# Patient Record
Sex: Male | Born: 1935 | Race: White | Hispanic: No | Marital: Married | Smoking: Former smoker
Health system: Southern US, Community
[De-identification: ages and names within clinical notes are randomized; demographics above are authoritative.]

## PROBLEM LIST (undated history)

## (undated) DIAGNOSIS — K219 Gastro-esophageal reflux disease without esophagitis: Secondary | ICD-10-CM

## (undated) DIAGNOSIS — R51 Headache: Secondary | ICD-10-CM

## (undated) DIAGNOSIS — M545 Low back pain, unspecified: Secondary | ICD-10-CM

## (undated) DIAGNOSIS — I10 Essential (primary) hypertension: Secondary | ICD-10-CM

## (undated) DIAGNOSIS — I219 Acute myocardial infarction, unspecified: Secondary | ICD-10-CM

## (undated) DIAGNOSIS — I2510 Atherosclerotic heart disease of native coronary artery without angina pectoris: Secondary | ICD-10-CM

## (undated) DIAGNOSIS — N400 Enlarged prostate without lower urinary tract symptoms: Secondary | ICD-10-CM

## (undated) DIAGNOSIS — IMO0002 Reserved for concepts with insufficient information to code with codable children: Secondary | ICD-10-CM

## (undated) DIAGNOSIS — J45909 Unspecified asthma, uncomplicated: Secondary | ICD-10-CM

## (undated) DIAGNOSIS — C801 Malignant (primary) neoplasm, unspecified: Secondary | ICD-10-CM

## (undated) DIAGNOSIS — R5383 Other fatigue: Secondary | ICD-10-CM

## (undated) DIAGNOSIS — K635 Polyp of colon: Secondary | ICD-10-CM

## (undated) DIAGNOSIS — J986 Disorders of diaphragm: Secondary | ICD-10-CM

## (undated) DIAGNOSIS — Z789 Other specified health status: Secondary | ICD-10-CM

## (undated) DIAGNOSIS — I714 Abdominal aortic aneurysm, without rupture, unspecified: Secondary | ICD-10-CM

## (undated) DIAGNOSIS — R9430 Abnormal result of cardiovascular function study, unspecified: Secondary | ICD-10-CM

## (undated) DIAGNOSIS — M5090 Cervical disc disorder, unspecified, unspecified cervical region: Secondary | ICD-10-CM

## (undated) DIAGNOSIS — I739 Peripheral vascular disease, unspecified: Secondary | ICD-10-CM

## (undated) DIAGNOSIS — R519 Headache, unspecified: Secondary | ICD-10-CM

## (undated) DIAGNOSIS — E785 Hyperlipidemia, unspecified: Secondary | ICD-10-CM

## (undated) DIAGNOSIS — I779 Disorder of arteries and arterioles, unspecified: Secondary | ICD-10-CM

## (undated) DIAGNOSIS — R748 Abnormal levels of other serum enzymes: Secondary | ICD-10-CM

## (undated) HISTORY — DX: Abnormal levels of other serum enzymes: R74.8

## (undated) HISTORY — DX: Other specified health status: Z78.9

## (undated) HISTORY — PX: SKIN CANCER EXCISION: SHX779

## (undated) HISTORY — DX: Hyperlipidemia, unspecified: E78.5

## (undated) HISTORY — DX: Atherosclerotic heart disease of native coronary artery without angina pectoris: I25.10

## (undated) HISTORY — DX: Gastro-esophageal reflux disease without esophagitis: K21.9

## (undated) HISTORY — DX: Low back pain: M54.5

## (undated) HISTORY — DX: Disorders of diaphragm: J98.6

## (undated) HISTORY — DX: Abnormal result of cardiovascular function study, unspecified: R94.30

## (undated) HISTORY — DX: Cervical disc disorder, unspecified, unspecified cervical region: M50.90

## (undated) HISTORY — PX: BREAST SURGERY: SHX581

## (undated) HISTORY — DX: Reserved for concepts with insufficient information to code with codable children: IMO0002

## (undated) HISTORY — DX: Essential (primary) hypertension: I10

## (undated) HISTORY — DX: Peripheral vascular disease, unspecified: I73.9

## (undated) HISTORY — DX: Low back pain, unspecified: M54.50

## (undated) HISTORY — DX: Benign prostatic hyperplasia without lower urinary tract symptoms: N40.0

## (undated) HISTORY — PX: CERVICAL DISC SURGERY: SHX588

## (undated) HISTORY — DX: Other fatigue: R53.83

## (undated) HISTORY — DX: Polyp of colon: K63.5

## (undated) HISTORY — DX: Disorder of arteries and arterioles, unspecified: I77.9

---

## 1998-07-29 ENCOUNTER — Other Ambulatory Visit: Admission: RE | Admit: 1998-07-29 | Discharge: 1998-07-29 | Payer: Self-pay | Admitting: Urology

## 1999-11-29 ENCOUNTER — Inpatient Hospital Stay (HOSPITAL_COMMUNITY): Admission: EM | Admit: 1999-11-29 | Discharge: 1999-12-02 | Payer: Self-pay | Admitting: *Deleted

## 2000-03-10 ENCOUNTER — Inpatient Hospital Stay (HOSPITAL_COMMUNITY): Admission: AD | Admit: 2000-03-10 | Discharge: 2000-03-12 | Payer: Self-pay | Admitting: Cardiology

## 2002-02-08 ENCOUNTER — Encounter: Payer: Self-pay | Admitting: Specialist

## 2002-02-08 ENCOUNTER — Ambulatory Visit (HOSPITAL_COMMUNITY): Admission: RE | Admit: 2002-02-08 | Discharge: 2002-02-08 | Payer: Self-pay | Admitting: Specialist

## 2003-06-06 ENCOUNTER — Ambulatory Visit (HOSPITAL_COMMUNITY): Admission: RE | Admit: 2003-06-06 | Discharge: 2003-06-06 | Payer: Self-pay | Admitting: Cardiology

## 2003-06-06 ENCOUNTER — Encounter: Payer: Self-pay | Admitting: Cardiology

## 2004-04-16 ENCOUNTER — Encounter: Payer: Self-pay | Admitting: Pulmonary Disease

## 2004-04-22 ENCOUNTER — Ambulatory Visit: Admission: RE | Admit: 2004-04-22 | Discharge: 2004-04-22 | Payer: Self-pay | Admitting: Pulmonary Disease

## 2004-04-22 ENCOUNTER — Encounter: Payer: Self-pay | Admitting: Pulmonary Disease

## 2004-05-13 ENCOUNTER — Ambulatory Visit (HOSPITAL_COMMUNITY): Admission: RE | Admit: 2004-05-13 | Discharge: 2004-05-13 | Payer: Self-pay | Admitting: Pulmonary Disease

## 2004-05-13 ENCOUNTER — Encounter: Payer: Self-pay | Admitting: Pulmonary Disease

## 2004-06-20 ENCOUNTER — Ambulatory Visit (HOSPITAL_COMMUNITY): Admission: RE | Admit: 2004-06-20 | Discharge: 2004-06-20 | Payer: Self-pay | Admitting: Pulmonary Disease

## 2004-08-26 ENCOUNTER — Inpatient Hospital Stay (HOSPITAL_COMMUNITY): Admission: RE | Admit: 2004-08-26 | Discharge: 2004-08-28 | Payer: Self-pay | Admitting: Neurosurgery

## 2004-10-17 ENCOUNTER — Ambulatory Visit: Payer: Self-pay | Admitting: Cardiology

## 2004-11-04 ENCOUNTER — Ambulatory Visit: Payer: Self-pay | Admitting: Cardiology

## 2004-11-28 ENCOUNTER — Ambulatory Visit: Payer: Self-pay | Admitting: Cardiology

## 2005-01-16 ENCOUNTER — Ambulatory Visit: Payer: Self-pay | Admitting: Cardiology

## 2005-01-30 ENCOUNTER — Ambulatory Visit: Payer: Self-pay | Admitting: Pulmonary Disease

## 2005-01-30 ENCOUNTER — Ambulatory Visit (HOSPITAL_COMMUNITY): Admission: RE | Admit: 2005-01-30 | Discharge: 2005-01-30 | Payer: Self-pay | Admitting: Neurosurgery

## 2005-02-27 ENCOUNTER — Ambulatory Visit: Payer: Self-pay | Admitting: Pulmonary Disease

## 2005-06-01 ENCOUNTER — Ambulatory Visit: Payer: Self-pay | Admitting: Pulmonary Disease

## 2005-06-05 ENCOUNTER — Ambulatory Visit: Payer: Self-pay | Admitting: Cardiology

## 2005-06-26 ENCOUNTER — Ambulatory Visit: Payer: Self-pay

## 2005-10-02 ENCOUNTER — Ambulatory Visit: Payer: Self-pay | Admitting: Pulmonary Disease

## 2005-10-20 ENCOUNTER — Ambulatory Visit: Payer: Self-pay | Admitting: Cardiology

## 2006-04-02 ENCOUNTER — Ambulatory Visit: Payer: Self-pay | Admitting: Pulmonary Disease

## 2006-07-09 ENCOUNTER — Ambulatory Visit: Payer: Self-pay | Admitting: Cardiology

## 2006-07-16 ENCOUNTER — Ambulatory Visit: Payer: Self-pay | Admitting: Internal Medicine

## 2006-08-19 ENCOUNTER — Encounter: Payer: Self-pay | Admitting: Pulmonary Disease

## 2006-08-27 ENCOUNTER — Ambulatory Visit: Payer: Self-pay | Admitting: Pulmonary Disease

## 2006-09-24 ENCOUNTER — Ambulatory Visit: Payer: Self-pay | Admitting: Pulmonary Disease

## 2006-09-24 LAB — CONVERTED CEMR LAB
INR: 1 (ref 0.9–2.0)
Prothrombin Time: 12.7 s (ref 10.0–14.0)
aPTT: 33.2 s (ref 26.5–36.5)

## 2006-09-28 ENCOUNTER — Encounter (INDEPENDENT_AMBULATORY_CARE_PROVIDER_SITE_OTHER): Payer: Self-pay | Admitting: Specialist

## 2006-09-28 ENCOUNTER — Ambulatory Visit: Payer: Self-pay | Admitting: Pulmonary Disease

## 2006-09-28 ENCOUNTER — Ambulatory Visit: Admission: RE | Admit: 2006-09-28 | Discharge: 2006-09-28 | Payer: Self-pay | Admitting: Pulmonary Disease

## 2006-10-05 ENCOUNTER — Ambulatory Visit: Payer: Self-pay | Admitting: Pulmonary Disease

## 2006-11-16 ENCOUNTER — Encounter: Payer: Self-pay | Admitting: Pulmonary Disease

## 2006-11-30 ENCOUNTER — Ambulatory Visit: Payer: Self-pay

## 2007-02-25 ENCOUNTER — Ambulatory Visit: Payer: Self-pay | Admitting: Cardiology

## 2007-02-25 LAB — CONVERTED CEMR LAB
BUN: 18 mg/dL (ref 6–23)
CO2: 27 meq/L (ref 19–32)
Calcium: 8.5 mg/dL (ref 8.4–10.5)
Chloride: 107 meq/L (ref 96–112)
Creatinine, Ser: 1.1 mg/dL (ref 0.4–1.5)
GFR calc Af Amer: 84 mL/min
GFR calc non Af Amer: 69 mL/min
Glucose, Bld: 121 mg/dL — ABNORMAL HIGH (ref 70–99)
Potassium: 4.1 meq/L (ref 3.5–5.1)
Sodium: 140 meq/L (ref 135–145)

## 2007-07-14 DIAGNOSIS — R0600 Dyspnea, unspecified: Secondary | ICD-10-CM | POA: Insufficient documentation

## 2007-07-14 DIAGNOSIS — J449 Chronic obstructive pulmonary disease, unspecified: Secondary | ICD-10-CM | POA: Insufficient documentation

## 2007-07-14 DIAGNOSIS — R0609 Other forms of dyspnea: Secondary | ICD-10-CM

## 2007-11-07 ENCOUNTER — Ambulatory Visit: Payer: Self-pay | Admitting: Pulmonary Disease

## 2007-11-07 DIAGNOSIS — R930 Abnormal findings on diagnostic imaging of skull and head, not elsewhere classified: Secondary | ICD-10-CM | POA: Insufficient documentation

## 2008-02-01 ENCOUNTER — Ambulatory Visit: Payer: Self-pay | Admitting: Cardiology

## 2008-02-20 ENCOUNTER — Ambulatory Visit: Payer: Self-pay | Admitting: Cardiology

## 2008-02-20 ENCOUNTER — Ambulatory Visit: Payer: Self-pay

## 2008-02-20 ENCOUNTER — Encounter: Payer: Self-pay | Admitting: Cardiology

## 2008-05-04 ENCOUNTER — Ambulatory Visit: Payer: Self-pay | Admitting: Pulmonary Disease

## 2008-10-31 ENCOUNTER — Ambulatory Visit: Payer: Self-pay | Admitting: Cardiology

## 2008-11-01 DIAGNOSIS — I10 Essential (primary) hypertension: Secondary | ICD-10-CM | POA: Insufficient documentation

## 2008-11-01 DIAGNOSIS — E785 Hyperlipidemia, unspecified: Secondary | ICD-10-CM | POA: Insufficient documentation

## 2008-11-07 ENCOUNTER — Ambulatory Visit: Payer: Self-pay

## 2008-11-23 ENCOUNTER — Ambulatory Visit: Payer: Self-pay | Admitting: Pulmonary Disease

## 2008-11-30 ENCOUNTER — Encounter: Payer: Self-pay | Admitting: Cardiology

## 2008-11-30 ENCOUNTER — Ambulatory Visit: Payer: Self-pay | Admitting: Cardiology

## 2009-05-07 ENCOUNTER — Encounter: Payer: Self-pay | Admitting: Cardiology

## 2009-05-24 ENCOUNTER — Ambulatory Visit: Payer: Self-pay | Admitting: Pulmonary Disease

## 2009-05-28 ENCOUNTER — Encounter: Payer: Self-pay | Admitting: Cardiology

## 2009-05-29 ENCOUNTER — Ambulatory Visit: Payer: Self-pay | Admitting: Cardiology

## 2009-11-25 ENCOUNTER — Ambulatory Visit: Payer: Self-pay | Admitting: Pulmonary Disease

## 2009-12-13 ENCOUNTER — Telehealth (INDEPENDENT_AMBULATORY_CARE_PROVIDER_SITE_OTHER): Payer: Self-pay | Admitting: *Deleted

## 2010-02-26 ENCOUNTER — Telehealth: Payer: Self-pay | Admitting: Cardiology

## 2010-02-27 ENCOUNTER — Encounter: Payer: Self-pay | Admitting: Cardiology

## 2010-02-28 ENCOUNTER — Ambulatory Visit: Payer: Self-pay | Admitting: Cardiology

## 2010-05-15 ENCOUNTER — Encounter: Payer: Self-pay | Admitting: Cardiology

## 2010-05-16 ENCOUNTER — Ambulatory Visit: Payer: Self-pay | Admitting: Cardiology

## 2010-05-26 ENCOUNTER — Ambulatory Visit: Payer: Self-pay | Admitting: Pulmonary Disease

## 2010-10-05 ENCOUNTER — Encounter: Payer: Self-pay | Admitting: Pulmonary Disease

## 2010-10-14 NOTE — Assessment & Plan Note (Signed)
Summary: rov for emphysema   Copy to:  Harlow Mares  M.D. Primary Provider/Referring Provider:  Harlow Mares MD  CC:  Pt is here for a 6 month f/u appt.  Pt is taking both Advair and Spiriva as he felt  his breathing is better on both these meds. Pt states occ. cough with small amounts of white sputum.  [name]  History of Present Illness: The pt comes in today for f/u of his know mild to moderate emphysema.  Overall, he feels that he is doing well, and has excellent exercise tolerance.  He has been experimenting with his meds, and feels that both advair and spiriva help him considerably.  Denies any cough, congestion, or mucus.  Current Medications (verified): 1)  Spiriva Handihaler 18 Mcg  Caps (Tiotropium Bromide Monohydrate) .... Inhale Contents of 1 Capsule Once A Day 2)  Advair Diskus 250-50 Mcg/dose  Misc (Fluticasone-Salmeterol) .... Inhale 1 Puff Two Times A Day 3)  Proventil 90 Mcg/act  Aers (Albuterol) .... Use As Directed Every 4 Hours 4)  Atenolol 50 Mg  Tabs (Atenolol) .... Take 1 Tablet By Mouth Once A Day 5)  [facility]vs Aspirin 325 Mg  Tabs (Aspirin) .... Take 1 Tablet By Mouth Once A Day 6)  Furosemide 40 Mg Tabs (Furosemide) .... Take One Tablet By Mouth Daily As Needed 7)  Avodart 0.5 Mg Caps (Dutasteride) .... Take 1 Tablet By Mouth Once A Day 8)  Omeprazole 20 Mg Cpdr (Omeprazole) .... Take One Tablet By Mouth in Am.  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 PTCA RCA FOR IN-STENT RESTENOSIS 6/01 ABNORMAL CHEST XRAY (ICD-793) EMPHYSEMA, MILD (ICD-492.8) GERD CERVICAL DISK DISEASE S/P NEUROSURGERY BPH HX COLON POLPS PARALYZED HEMIDIAPHRAGM chronic Carotid artery disease mild Colon polyps BPH REMOTE TOBACCO good LV  function Chronically elevated CPK (mild)  Review of Systems      See HPI  Vital Signs:  Patient profile:   77 year old male Height:      74 inches Weight:      208 pounds BMI:     26.80 O2 Sat:      95 % on Room air Temp:     97.8 degrees F oral Pulse rate:   68 / minute BP sitting:   140 / 90  (right arm) Cuff size:   regular  Vitals Entered By: Arman Filter LPN (May 24, 2009 9:16 AM)  O2 Flow:  Room air CC: Pt is here for a 6 month f/u appt.  Pt is taking both Advair and Spiriva as he felt  his breathing is better on both these meds. Pt states occ. cough with small amounts of white sputum.   Comments Medications reviewed with patient Arman Filter LPN  May 24, 2009 9:16 AM    Physical Exam  General:  wd male in nad Nose:  no purulence Lungs:  mild decrease in bs, faint basilar crackles, no wheezing Heart:  rrr Extremities:  no edema.   Impression & Recommendations:  Problem # 1:  EMPHYSEMA, MILD (ICD-492.8)  the pt is doing well on his current regimen.  He is satisfied with his exercise tolerance, and has had no recent exacerbation.  I have asked him to continue on his current meds.  He is due for his pna shot today, and will get flu  shot when vaccine available.  Other Orders: Est. Patient Level II (84132) Pneumococcal Vaccine (44010) Admin 1st Vaccine (27253) Admin 1st Vaccine [facility]) [phone])  Patient Instructions: 1)  stay on current meds. 2)  don't forget about flu shot in october. 3)  stay as active as possible. 4)  followup with me in 6mos     Pneumovax Vaccine    Vaccine Type: Pneumovax    Site: right deltoid    Mfr: Merck    Dose: 0.5 ml    Route: IM    Given by: Arman Filter LPN    Exp. Date: 10/19/2009    Lot #: [id number]    VIS given: 04/11/96 version given May 24, 2009.

## 2010-10-14 NOTE — Progress Notes (Signed)
Summary: sub for proventil   Phone Note Call from Patient Call back at Home Phone [phone]   Caller: Patient Call For: clance Summary of Call: pt's ins- [employer]/ [employer], has sent him a letter recommending 2 subs for his rx proventil as this is no longer on their preferred list. subs acceptable are: PROAIR HFA or VENTOLIN HFA. pt states that if dr clance approves either of these for a sub [facility] [place] [address] ([facility] only one [facility] per pt).  Initial call taken by: Tivis Ringer, CNA,  December 13, 2009 10:36 AM  Follow-up for Phone Call        called and spoke with pt.  pt aware rx sent to pharmacy.  Megan Reynolds LPN  December 13, 1476 10:42 AM     New/Updated Medications: VENTOLIN HFA 108 (90 BASE) MCG/ACT  AERS (ALBUTEROL SULFATE) 1-2 puffs every 4-6 hours as needed Prescriptions: VENTOLIN HFA 108 (90 BASE) MCG/ACT  AERS (ALBUTEROL SULFATE) 1-2 puffs every 4-6 hours as needed  #1 x 5   Entered by:   Arman Filter LPN   Authorized by:   Barbaraann Share MD   Signed by:   Arman Filter LPN on 29/56/2130   Method used:   [facility] to        [facility] [id number] (retail)       [address]       [place], [place]  [address]       Ph: [phone] or [phone]       Fax: [phone]   RxID:   [id number]

## 2010-10-14 NOTE — Assessment & Plan Note (Signed)
Visit Type:  Follow-up Referring Provider:  Harlow Mares  M.D. Primary Provider:  Harlow Mares MD   History of Present Illness: patient is here for follow up of his coronary disease. He was seen in the office recently having arm discomfort. There was concern that it could be ischemia because of his known coronary disease. Also there was consideration that he could have problems with carpal tunnel and he was told to change his wristwatch to the other hand. In fact he is feeling better. He did have a stress Myoview scan. This study showed a good ejection fraction and no significant ischemia. He is now here for followup.  Current Medications (verified): 1)  Spiriva Handihaler 18 Mcg  Caps (Tiotropium Bromide Monohydrate) .... Inhale Contents of 1 Capsule Once A Day 2)  Prilosec 20 Mg Cpdr (Omeprazole) .... Take 1 Tablet By Mouth Once A Day 3)  Advair Diskus 250-50 Mcg/dose  Misc (Fluticasone-Salmeterol) .... Inhale 1 Puff Two Times A Day 4)  Proventil 90 Mcg/act  Aers (Albuterol) .... Use As Directed Every 4 Hours 5)  Atenolol 50 Mg  Tabs (Atenolol) .... Take 1 Tablet By Mouth Once A Day 6)  Cvs Aspirin 325 Mg  Tabs (Aspirin) .... Take 1 Tablet By Mouth Once A Day 7)  Furosemide 20 Mg Tabs (Furosemide) .... Take 1 Tablet By Mouth Two Times A Day As Needed 8)  Avodart 0.5 Mg Caps (Dutasteride) .... 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 PTCA RCA FOR IN-STENT RESTENOSIS 6/01    ABNORMAL CHEST XRAY (ICD-793.1)    DYSPNEA ON EXERTION (ICD-786.09)    EMPHYSEMA, MILD (ICD-492.8)    GERD    CERVICAL DISK DISEASE S/P NEUROSURGERY    BPH    HX COLON POLPS    PARALYZED HEMIDIAPHRAGM  chronic    Carotid artery disease mild    Colon polyps    BPH    REMOTE TOBACCO    good LV function    Chronically elevated CPK (mild)  Review of Systems       patient feels well today. He has no fevers or chills. There has been no major weight gain or weight loss. There is no blurred vision. He has no GI or GU symptoms. There are no musculoskeletal complaints today. Review of systems otherwise is negative.  Vital Signs:  Patient profile:   77 year old male Height:      74 inches Weight:      206 pounds Pulse rate:   72 / minute BP sitting:   124 / 64  (left arm)  Vitals Entered By: Laurance Flatten CMA (November 30, 2008 9:16 AM)  Physical Exam  General:  Stable in general. Head:  Head is normocephalic. Eyes:  HEENT reveals no xanthelasma. He has normal extraocular motion. Mouth:  There no major oral or dental problems today. Neck:  There is no jugular venous distention. There are nor carotid bruits. Chest Wall:  Chest wall is nontender Lungs:  Lungs are clear. Respiratory effort is nonlabored. Heart:  cardiac exam reveals an S1-S2. There no clicks or significant murmurs Abdomen:  Bowel sounds are normal. Abdomen is soft. Extremities:  There is no peripheral  edema Psych:  Patient is oriented to person time and place. Affect is normal. He is here with his wife today.   Impression & Recommendations:  Problem # 1:  HYPERTENSION, BENIGN (ICD-401.1)  His updated medication list for this problem includes:    Atenolol 50 Mg Tabs (Atenolol) .[name]... Take 1 tablet by mouth once a day    [facility]vs Aspirin 325 Mg Tabs (Aspirin) .[name]... Take 1 tablet by mouth once a day    Furosemide 20 Mg Tabs (Furosemide) .[name]... Take 1 tablet by mouth two times a day as needed Blood pressure stable today. No change in his cardiac meds for blood pressure  Problem # 2:  HYPERLIPIDEMIA-MIXED (ICD-272.4) He does have hyperlipidemia. As always we talk about watching his diet carefully. He did not use a statin because  of elevated CPK.  Problem # 3:  CAD, NATIVE VESSEL (ICD-414.01)  His updated medication list for this problem includes:    Atenolol 50 Mg Tabs (Atenolol) .[name]... Take 1 tablet by mouth once a day    Cvs Aspirin 325 Mg Tabs (Aspirin) .[name]... Take 1 tablet by mouth once a day Coronary disease is stable. See the discussion concerning his Myoview scan. There was no significant ischemia. He'll be followed.  Problem # 4:  * POSSIBLE CARPAL TUNNEL at this time the patient will try to continue to keep his watch off his left wrist.  Cardiac status is stable. I'll see him back in 6 months for followup.

## 2010-10-14 NOTE — Assessment & Plan Note (Signed)
Summary: rov for emphysema   CC:  Pt is here for a 6 month f/u appt.  Pt states breathing is the same.  Pt denied any new complaints.  Pt states he is not taking his Spiriva and Adavir daily.  Tim [name]  History of Present Illness: the pt comes in today for f/u of his obstructive lung disease.  He is doing fairly well from a breathing standpoint, and denies any decrease in his exertional tolerance.  No significant cough or mucus.  He is continuing to struggle with the costs of his bronchodilator regimen.  He is spreading his dosages over a time period to conserve.  Current Medications (verified): 1)  Spiriva Handihaler 18 Mcg  Caps (Tiotropium Bromide Monohydrate) .... Inhale Contents of 1 Capsule Once A Day 2)  Prilosec 20 Mg Cpdr (Omeprazole) .... Take 1 Tablet By Mouth Once A Day 3)  Advair Diskus 250-50 Mcg/dose  Misc (Fluticasone-Salmeterol) .... Inhale 1 Puff Two Times A Day 4)  Proventil 90 Mcg/act  Aers (Albuterol) .... Use As Directed Every 4 Hours 5)  Atenolol 50 Mg  Tabs (Atenolol) .... Take 1 Tablet By Mouth Once A Day 6)  Cvs Aspirin 325 Mg  Tabs (Aspirin) .... Take 1 Tablet By Mouth Once A Day 7)  Furosemide 20 Mg Tabs (Furosemide) .... Take 1 Tablet By Mouth Two Times A Day As Needed 8)  Avodart 0.5 Mg Caps (Dutasteride) .... Take 1 Tablet By Mouth Once A Day  Allergies (verified): No Known Drug Allergies  Review of Systems      See HPI  Vital Signs:  Patient profile:   77 year old male Weight:      210 pounds BMI:     27.06 O2 Sat:      97 % Temp:     97.7 degrees F Pulse rate:   97 / minute BP sitting:   154 / 86  (left arm) Cuff size:   regular  Vitals Entered By: Arman Filter LPN (November 23, 2008 9:21 AM)  O2 Sat on room air at rest %:  97 CC: Pt is here for a 6 month f/u appt.  Pt states breathing is the same.  Pt denied any new complaints.  Pt states he is not taking his Spiriva and Adavir daily.   Comments Medications reviewed with patient Arman Filter LPN   November 23, 2008 9:27 AM    Physical Exam  General:  thin male in nad Lungs:  mildly decreased bs, no wheezing or rhonchi Heart:  rrr, no mrg Extremities:  no significant edema   Impression & Recommendations:  Problem # 1:  EMPHYSEMA, MILD (ICD-492.8) the pt is doing well from a pulmonary standpoint despite rationing his meds.  I have asked him to try and get by with one med, but needs to take on a consistent basis.  I have asked him to try and come off advair first and see how things go.  I have also asked him to get on some type of exercise program to increase endurance.  Complete Medication List: 1)  Spiriva Handihaler 18 Mcg Caps (Tiotropium bromide monohydrate) .... Inhale contents of 1 capsule once a day 2)  Prilosec 20 Mg Cpdr (Omeprazole) .... Take 1 tablet by mouth once a day 3)  Advair Diskus 250-50 Mcg/dose Misc (Fluticasone-salmeterol) .... Inhale 1 puff two times a day 4)  Proventil 90 Mcg/act Aers (Albuterol) .... Use as directed every 4 hours 5)  Atenolol 50 Mg Tabs (Atenolol) .... Take  1 tablet by mouth once a day 6)  Cvs Aspirin 325 Mg Tabs (Aspirin) .... Take 1 tablet by mouth once a day 7)  Furosemide 20 Mg Tabs (Furosemide) .... Take 1 tablet by mouth two times a day as needed 8)  Avodart 0.5 Mg Caps (Dutasteride) .... Take 1 tablet by mouth once a day  Other Orders: Est. Patient Level III (04540)  Patient Instructions: 1)  stay on spiriva each am. 2)  try to stop advair and see if you can do ok off the med.  If your breathing worsens, would go back on advair and see if you can stop spiriva. 3)  follow up with me in 6mos or sooner if problems.

## 2010-10-14 NOTE — Assessment & Plan Note (Signed)
Summary: f72m  Medications Added FUROSEMIDE 40 MG TABS (FUROSEMIDE) Take one tablet by mouth daily as needed      Allergies Added: NKDA  Visit Type:  Follow-up Referring Provider:  Harlow Mares  M.D. Primary Provider:  Harlow Mares MD  CC:  CAD.  History of Present Illness: The patient is seen for followup of coronary artery disease.  He's had interventions in the past.  Earlier this year we were concerned about some mild symptoms.  He had a Myoview in February, 2010 that showed no significant ischemia.  He returns today and he is not having any significant problems.  He saw Dr.Clance of pulmonary recently who felt he was stable.  He's not had any significant chest pain.  Current Medications (verified): 1)  Spiriva Handihaler 18 Mcg  Caps (Tiotropium Bromide Monohydrate) .... Inhale Contents of 1 Capsule Once A Day 2)  Advair Diskus 250-50 Mcg/dose  Misc (Fluticasone-Salmeterol) .... Inhale 1 Puff Two Times A Day 3)  Proventil 90 Mcg/act  Aers (Albuterol) .... Use As Directed Every 4 Hours 4)  Atenolol 50 Mg  Tabs (Atenolol) .... Take 1 Tablet By Mouth Once A Day 5)  Cvs Aspirin 325 Mg  Tabs (Aspirin) .... Take 1 Tablet By Mouth Once A Day 6)  Furosemide 40 Mg Tabs (Furosemide) .... Take One Tablet By Mouth Daily As Needed 7)  Avodart 0.5 Mg Caps (Dutasteride) .... Take 1 Tablet By Mouth Once A Day 8)  Omeprazole 20 Mg Cpdr (Omeprazole) .... Take One Tablet By Mouth in Am.  Allergies (verified): No Known Drug Allergies  Past History:  Past Medical History: Reviewed history from 05/28/2009 and no changes required.  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 PTCA RCA FOR IN-STENT RESTENOSIS 6/01../ Myoview done February, 2010 showed no ischemia.   ABNORMAL CHEST XRAY (ICD-793) EMPHYSEMA, MILD (ICD-492.8) GERD CERVICAL DISK  DISEASE S/P NEUROSURGERY BPH HX COLON POLPS PARALYZED HEMIDIAPHRAGM chronic Carotid artery disease mild Colon polyps BPH REMOTE TOBACCO good LV function Chronically elevated CPK (mild)  Review of Systems       Patient denies fever, chills, headache, skin rash, sweats, change in vision, change in hearing, chest pain, cough.  He does have some shortness of breath it is related to his underlying pulmonary disease that has not changed.  He denies GI symptoms or GU symptoms.  All other systems are reviewed and are negative.  Vital Signs:  Patient profile:   77 year old male Height:      74 inches Weight:      204 pounds BMI:     26.29 Pulse rate:   63 / minute BP sitting:   138 / 80  (left arm)  Vitals Entered By: Laurance Flatten CMA (May 29, 2009 9:56 AM)  Physical Exam  General:  stable today. Eyes:  no xanthelasma Neck:  no jugular venous distention. Lungs:  lungs reveal mild decrease in breath sounds but no rales. Heart:  cardiac exam reveals S1-S2.  No clicks or significant murmurs. Abdomen:  abdomen is soft. Msk:  no musculoskeletal deformities. Extremities:  no peripheral edema. Psych:  patient is oriented to person time and place.  Affect is normal.   Impression & Recommendations:  Problem # 1:  HYPERTENSION, BENIGN (ICD-401.1) Blood pressure is controlled today.  No change in meds.  Problem # 2:  CAD, NATIVE VESSEL (ICD-414.01) Coronary disease is stable.  No  further workup at this time.  EKG is done.  It is reviewed by me.  It is normal.  Problem # 3:  FLUID OVERLOAD (ICD-276.6) At times the patient feels some mild abdominal bloating.  He will take one dose of Lasix.  With this he gets a good response.  Patient Instructions: 1)  Follow up in 1 year

## 2010-10-14 NOTE — Assessment & Plan Note (Signed)
Summary: Tim [name] for emphysema   Chief Complaint:  Pt is here for a 6 month follow up.  Pt states no change in breathing.  Pt tried the samples of Symbicort but stated he preferred Advair instead.[name]  History of Present Illness: patient comes in today for followup of his emphysema. At the last visit, the patient was tried on symbicort due to expense, but felt advair did a better job and stayed on this. He feels that he is at his usual baseline, and has not had any recent exacerbation or pulmonary infection. He denies any significant cough or mucus production.     Current Allergies: No known allergies      Review of Systems      See HPI   Vital Signs:  Patient Profile:   77 Years Old Male Height:     74 inches Weight:      209.50 pounds O2 Sat:      99 % O2 treatment:    Room Air Temp:     97.6 degrees F oral Pulse rate:   78 / minute BP sitting:   144 / 82  (left arm) Cuff size:   regular  Vitals Entered By: Cyndia Diver LPN (May 04, 2008 8:58 AM)             Comments Medications reviewed with patient Cyndia Diver LPN  May 04, 2008 8:58 AM      Physical Exam  General:     wd male in nad Lungs:     chest is fairly clear, no definite wheezing Heart:     rrr      Impression & Recommendations:  Problem # 1:  EMPHYSEMA, MILD (ICD-492.8) the patient is doing well on his current regimen, and I have asked him to stay on this as well as continuing some type of exercise program. I have asked him to look into some type of mail order program for his medications, which would greatly reduce his expense. Orders: Est. Patient Level II (21308)   Medications Added to Medication List This Visit: 1)  Nexium 40 Mg Cpdr (Esomeprazole magnesium) .... Take 1 tablet by mouth two times a day 2)  Furosemide 20 Mg Tabs (Furosemide) .... Take 1 tablet by mouth two times a day as needed 3)  Avodart 0.5 Mg Caps (Dutasteride) .... Take 1 tablet by mouth once a day   Patient  Instructions: 1)  stay on current meds 2)  check and see if you have a drug mail order program that is cheaper. 3)  f/u 6mos or sooner if problems.   Prescriptions: PROVENTIL 90 MCG/ACT  AERS (ALBUTEROL) Use as directed every 4 hours  #1 x 6   Entered and Authorized by:   Barbaraann Share MD   Signed by:   Barbaraann Share MD on 05/04/2008   Method used:   Print then Give to Patient   RxID:   [id number] ADVAIR DISKUS 250-50 MCG/DOSE  MISC (FLUTICASONE-SALMETEROL) Inhale 1 puff two times a day  #60 Each x 6   Entered and Authorized by:   Barbaraann Share MD   Signed by:   Barbaraann Share MD on 05/04/2008   Method used:   Print then Give to Patient   RxID:   [id number] SPIRIVA HANDIHALER 18 MCG  CAPS (TIOTROPIUM BROMIDE MONOHYDRATE) Inhale contents of 1 capsule once a day  #30 Capsule x 6   Entered and Authorized by:   Barbaraann Share MD   Signed  by:   Barbaraann Share MD on 05/04/2008   Method used:   Print then Give to Patient   RxID:   [id number]  ]

## 2010-10-14 NOTE — Assessment & Plan Note (Signed)
Summary: f1y  Medications Added ASPIRIN EC 325 MG TBEC (ASPIRIN) Take one tablet by mouth daily      Allergies Added: NKDA  Visit Type:  Follow-up Referring Provider:  Harlow Mares  M.D. Primary Provider:  Harlow Mares MD  CC:  CAD.  History of Present Illness: The patient is seen for followup of coronary artery disease.  He is doing well.  He is not having any chest pain or shortness of breath.  He does have a full activities.  He mentions that he has fatigue in the morning but he feels better as the day goes on.  Current Medications (verified): 1)  Spiriva Handihaler 18 Mcg  Caps (Tiotropium Bromide Monohydrate) .... Inhale Contents of 1 Capsule Once A Day 2)  Advair Diskus 250-50 Mcg/dose  Misc (Fluticasone-Salmeterol) .... Inhale 1 Puff Two Times A Day 3)  Ventolin Hfa 108 (90 Base) Mcg/act  Aers (Albuterol Sulfate) .[name].. 1-2 Puffs Every 4-6 Hours As Needed 4)  Atenolol 50 Mg  Tabs (Atenolol) .... Take 1 Tablet By Mouth Once A Day 5)  Cvs Aspirin 325 Mg  Tabs (Aspirin) .... Take 1 Tablet By Mouth Once A Day 6)  Furosemide 40 Mg Tabs (Furosemide) .... Take One Tablet By Mouth Daily As Needed 7)  Finasteride 5 Mg Tabs (Finasteride) .... Take 1 Tablet By Mouth Once A Day 8)  Omeprazole 20 Mg Cpdr (Omeprazole) .... Take One Tablet By Mouth in Am. 9)  Aspirin Ec 325 Mg Tbec (Aspirin) .... 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 PTCA RCA FOR IN-STENT RESTENOSIS 6/01../ Myoview done February, 2010 showed no ischemia.   ABNORMAL CHEST XRAY (ICD-793) EMPHYSEMA, MILD (ICD-492.8) GERD CERVICAL DISK DISEASE S/P NEUROSURGERY BPH HX COLON POLPS PARALYZED HEMIDIAPHRAGM chronic Carotid artery disease mild  /    Doppler... February 27, 2010... 0-39% bilateral ICA Colon polyps BPH REMOTE TOBACCO good LV function Chronically elevated CPK (mild) Morning fatigue   August, 2011  Review of Systems       Patient denies fever, chills, headache, sweats, rash, change in vision, change in hearing, chest pain, cough, nausea or vomiting, urinary symptoms.  All of the systems are reviewed and are negative.  Vital Signs:  Patient profile:   77 year old male Height:      74 inches Weight:      210 pounds BMI:     27.06 Pulse rate:   74 / minute BP sitting:   138 / 82  (left arm) Cuff size:   large  Vitals Entered By: Hardin Negus, RMA (May 16, 2010 9:28 AM)  Physical Exam  General:  patient is stable today. Head:  head is atraumatic. Eyes:  no xanthelasma. Neck:  no jugular venous distention. Chest Wall:  no chest wall tenderness. Lungs:  lungs are clear.  Respiratory effort is unlabored. Heart:  cardiac exam reveals S1-S2.  No clicks or significant murmurs. Abdomen:  abdomen is soft. Msk:  no musculoskeletal deformities. Extremities:  no peripheral edema. Skin:  no skin rashes. Psych:  the patient is oriented to person time and place.  Affect is normal.   Impression & Recommendations:  Problem # 1:  CAROTID ARTERY DISEASE (ICD-433.10)  His updated medication list for this problem includes:    Cvs Aspirin 325  Mg Tabs (Aspirin) .[name]... Take 1 tablet by mouth once a day    Aspirin Ec 325 Mg Tbec (Aspirin) .[name]... Take one tablet by mouth daily carotid artery disease is stable.  No further workup today.  Problem # 2:  FLUID OVERLOAD (ICD-276.6) Fluid status is stable.  No change in therapy.  Problem # 3:  HYPERTENSION, BENIGN (ICD-401.1)  His updated medication list for this problem includes:    Atenolol 50 Mg Tabs (Atenolol) .[name]... Take 1 tablet by mouth once a day    Cvs Aspirin 325 Mg Tabs (Aspirin) .[name]... Take 1 tablet by mouth once a day    Furosemide 40 Mg Tabs (Furosemide) .[name]...  Take one tablet by mouth daily as needed    Aspirin Ec 325 Mg Tbec (Aspirin) .[name]... Take one tablet by mouth daily blood pressure control.  No change in therapy.  Problem # 4:  HYPERLIPIDEMIA-MIXED (ICD-272.4) The patient has chronically elevated CPK .  For this reason we have not been able to use statins.  Problem # 5:  CAD, NATIVE VESSEL (ICD-414.01)  His updated medication list for this problem includes:    Atenolol 50 Mg Tabs (Atenolol) .[name]... Take 1 tablet by mouth once a day    Cvs Aspirin 325 Mg Tabs (Aspirin) .[name]... Take 1 tablet by mouth once a day    Aspirin Ec 325 Mg Tbec (Aspirin) .[name]... Take one tablet by mouth daily  Orders: EKG w/ Interpretation (93000) Coronary disease is stable.  EKG is done today and reviewed by me.  There is no significant change.  No further workup needed.  Problem # 6:  DYSPNEA ON EXERTION (ICD-786.09)  His updated medication list for this problem includes:    Atenolol 50 Mg Tabs (Atenolol) .[name]... Take 1 tablet by mouth once a day    Cvs Aspirin 325 Mg Tabs (Aspirin) .[name]... Take 1 tablet by mouth once a day    Furosemide 40 Mg Tabs (Furosemide) .[name]... Take one tablet by mouth daily as needed    Aspirin Ec 325 Mg Tbec (Aspirin) .[name]... Take one tablet by mouth daily The patient's pulmonary status is followed very carefully by pulmonary team.  No change in therapy.  Problem # 7:  * MORNING FATIGUE, 2011 This is a new complaint today.  It is very clear that the patient feels better as the day goes on.  He is able to function well both from the pulmonary and the cardiac viewpoint.  I feel that his morning fatigue is not cardiac in origin.  No further cardiac workup for this.  Patient Instructions: 1)  Your physician recommends that you schedule a follow-up appointment in: 1 year

## 2010-10-14 NOTE — Miscellaneous (Signed)
  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 PTCA RCA FOR IN-STENT RESTENOSIS 6/01../ Myoview done February, 2010 showed no ischemia.   ABNORMAL CHEST XRAY (ICD-793) EMPHYSEMA, MILD (ICD-492.8) GERD CERVICAL DISK DISEASE S/P NEUROSURGERY BPH HX COLON POLPS PARALYZED HEMIDIAPHRAGM chronic Carotid artery disease mild Colon polyps BPH REMOTE TOBACCO good LV function Chronically elevated CPK (mild)  (05/28/2009 13:24) Added new observation of REFERRING MD: Harlow Mares  M.D. 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 PTCA RCA FOR IN-STENT RESTENOSIS 6/01../ Myoview done February, 2010 showed no ischemia.   ABNORMAL CHEST XRAY (ICD-793) EMPHYSEMA, MILD (ICD-492.8) GERD CERVICAL DISK DISEASE S/P NEUROSURGERY BPH HX COLON POLPS PARALYZED HEMIDIAPHRAGM chronic Carotid artery disease mild Colon polyps BPH REMOTE TOBACCO good LV function Chronically elevated CPK (mild)

## 2010-10-14 NOTE — Assessment & Plan Note (Signed)
Summary: rov for emphysema   Copy to:  Tim [name]  M.D. Primary Provider/Referring Provider:  Harlow Mares MD  CC:  Pt is here for a 6 month f/u appt.  Pt states breathing is unchanged from last visit.  Pt states he will occ have "coughing spells" and will cough up clear sputum.  Pt states he will alternate taking Spiriva and Advair.  History of Present Illness: the pt comes in today for f/u of his mild emphysema.  He is doing fairly well on his current regimen, and feels that he is at his usual baseline.  He has had a recent uri treated with zpak by his primary md.  Currently with no significant cough, congestion, or purulence.  He has occasional am cough that he relates to postnasal drip  Current Medications (verified): 1)  Spiriva Handihaler 18 Mcg  Caps (Tiotropium Bromide Monohydrate) .... Inhale Contents of 1 Capsule Once A Day 2)  Advair Diskus 250-50 Mcg/dose  Misc (Fluticasone-Salmeterol) .... Inhale 1 Puff Two Times A Day 3)  Proventil 90 Mcg/act  Aers (Albuterol) .... Use As Directed Every 4 Hours 4)  Atenolol 50 Mg  Tabs (Atenolol) .... Take 1 Tablet By Mouth Once A Day 5)  Cvs Aspirin 325 Mg  Tabs (Aspirin) .... Take 1 Tablet By Mouth Once A Day 6)  Furosemide 40 Mg Tabs (Furosemide) .... Take One Tablet By Mouth Daily As Needed 7)  Finasteride 5 Mg Tabs (Finasteride) .... Take 1 Tablet By Mouth Once A Day 8)  Omeprazole 20 Mg Cpdr (Omeprazole) .... Take One Tablet By Mouth in Am.  Allergies (verified): No Known Drug Allergies  Review of Systems      See HPI  Vital Signs:  Patient profile:   77 year old male Height:      74 inches Weight:      215 pounds BMI:     27.70 O2 Sat:      93 % on Room air Temp:     97.6 degrees F oral Pulse rate:   73 / minute BP sitting:   140 / 88  (left arm) Cuff size:   regular  Vitals Entered By: Arman Filter LPN (November 25, 2009 9:15 AM)  O2 Flow:  Room air CC: Pt is here for a 6 month f/u appt.  Pt states breathing is  unchanged from last visit.  Pt states he will occ have "coughing spells" and will cough up clear sputum.  Pt states he will alternate taking Spiriva and Advair Comments Medications reviewed with patient Arman Filter LPN  November 25, 2009 9:18 AM    Physical Exam  General:  wd male in nad Lungs:  decreased bs, but no wheezing or rhonchi Heart:  rrr   Impression & Recommendations:  Problem # 1:  EMPHYSEMA, MILD (ICD-492.8)  the pt is doing well on his current regimen.  He has had a recent URI treated with zpak by his primary md, but overall feels well.  I have asked him to continue with current meds, and to f/u with me in 6mos.  Medications Added to Medication List This Visit: 1)  Finasteride 5 Mg Tabs (Finasteride) .... Take 1 tablet by mouth once a day  Other Orders: Est. Patient Level II (16010)  Patient Instructions: 1)  no change in meds. 2)  can try zyrtec 10mg  at bedtime for postnasal drip and mucus collecting in throat. 3)  followup with me in 6mos

## 2010-10-14 NOTE — Progress Notes (Signed)
Summary: info on procedure on friday   Phone Note Call from Patient Call back at Home Phone [phone]   Caller: Patient [phone] Reason for Call: Talk to Nurse Summary of Call: pt wants to know what the procedure is he's having on friday-who ordered it-and why he's having it cna also be reached on cell at [phone] Initial call taken by: Glynda Jaeger,  February 26, 2010 12:51 PM  Follow-up for Phone Call        pt sch for carotids on Fri will req chart to see when last carotid was done Meredith Staggers, RN  February 26, 2010 5:24 PM   last carotids were done 6/09 and recommended 2 yr f/u wife is aware Meredith Staggers, RN  February 27, 2010 5:20 PM

## 2010-10-14 NOTE — Miscellaneous (Signed)
  Clinical Lists Changes  Observations: Added new observation of PRIMARY MD: Lindwood Qua, MD (05/16/2010 15:36)

## 2010-10-14 NOTE — Miscellaneous (Signed)
  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 PTCA RCA FOR IN-STENT RESTENOSIS 6/01../ Myoview done February, 2010 showed no ischemia.   ABNORMAL CHEST XRAY (ICD-793) EMPHYSEMA, MILD (ICD-492.8) GERD CERVICAL DISK DISEASE S/P NEUROSURGERY BPH HX COLON POLPS PARALYZED HEMIDIAPHRAGM chronic Carotid artery disease mild  /   Doppler... February 27, 2010... 0-39% bilateral ICA Colon polyps BPH REMOTE TOBACCO good LV function Chronically elevated CPK (mild)  (05/15/2010 13:26) Added new observation of REFERRING MD: Harlow Mares  M.D. 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 PTCA RCA FOR IN-STENT RESTENOSIS 6/01../ Myoview done February, 2010 showed no ischemia.   ABNORMAL CHEST XRAY (ICD-793) EMPHYSEMA, MILD (ICD-492.8) GERD CERVICAL DISK DISEASE S/P NEUROSURGERY BPH HX COLON POLPS PARALYZED HEMIDIAPHRAGM chronic Carotid artery disease mild  /   Doppler... February 27, 2010... 0-39% bilateral ICA Colon polyps BPH REMOTE TOBACCO good LV function Chronically elevated CPK (mild)

## 2010-10-14 NOTE — Miscellaneous (Signed)
Summary: Orders Update  Clinical Lists Changes  Problems: Added new problem of CAROTID ARTERY DISEASE (ICD-433.10) Orders: Added new Test order of Carotid Duplex (Carotid Duplex) - Signed 

## 2010-10-14 NOTE — Assessment & Plan Note (Signed)
Summary: f/u emphysema  Medications Added * ADIVAR Take 1 capsule by mouth once a day        Chief Complaint:  fu visit...Marland KitchenMarland Kitchenreviewed meds....tight oin chest.  History of Present Illness:  the patient comes in today for follow-up of his known emphysema.  He has been staying on his bronchodilator regimen, and has had no significant cough or change in his exertional tolerance.  His only complaint today is that he is having tightness in his subxiphoid/epigastric region of unknown etiology.  This does not extend along his costal margins, and by his description does not sound compatible with hyperinflation/airtrapping.  The patient has had no wheezing or purulence.  He denies significant gastroesophageal reflux disease.     Current Allergies: No known allergies      Review of Systems      See HPI   Vital Signs:  Patient Profile:   77 Years Old Male Height:     74 inches Weight:      214.4 pounds BMI:     27.63 O2 Sat:      94 % O2 treatment:    Room Air Temp:     97.6 degrees F oral Pulse rate:   79 / minute BP sitting:   140 / 80  (left arm) Cuff size:   regular  Pt. in pain?   no  Vitals Entered By: Clarise Cruz Duncan Dull) (November 07, 2007 9:30 AM)                  Physical Exam  General:     well developed male in no acute distress Lungs:     decreased breath sounds throughout but no wheezing rhonchi or crackles. Heart:     regular rate and rhythm. Abdomen:     no palpable abdominal tenderness.     Impression & Recommendations:  Problem # 1:  EMPHYSEMA, MILD (ICD-492.8) emphysema which appears to be well controlled on his current bronchodilator regimen.  His current epigastric complaints may be due to air trapping, but I am more suspicious of GI etiology.  Its more give him a trial of medication for reflux, and see if he sees improvement.  Will also change his Advair to symbicort since he is concerned about cost, and would like to look at a cheaper  alternative.I would also like to check a chest x-ray today for completeness to make sure there is not something going on in the chest to explain his symptoms.  Medications Added to Medication List This Visit: 1)  Adivar  .... Take 1 capsule by mouth once a day   Patient Instructions: 1)  take 2 nexium a day for 2 weeks to see if stomach tightness goes away.  If it does not, go back to one a day. 2)  use up the rest of advair, then start symbicort 2 puffs am and pm with spacer as a trial.  If you like better, call and we will get prescription for you. 3)  will check cxr today and call with results. 4)  f/u in 6mos or sooner if problems.    ]

## 2010-10-14 NOTE — Assessment & Plan Note (Signed)
Summary: rov for emphysema   Copy to:  Harlow Mares  M.D. Primary Provider/Referring Provider:  Lindwood Qua, MD  CC:  6 month follow up. pt states he has SOB wth activity and is a little worse than last time and also at rest sometimes. pt c/o occasional cough and coughs up more phlem in the AM.  History of Present Illness: the pt comes in today for f/u of his known emphysema.  He is maintaining on his bronchodilator regimen, and is staying active.  His breathing is a little off from his usual baseline, but he feels it is due to the heat.  He has minimal cough, and no significant mucus.  He also notes a little more fluid retention, and has lasix to take for this.  Preventive Screening-Counseling & Management  Alcohol-Tobacco     Smoking Status: quit     Packs/Day: 2.5     Year Started: 1942     Year Quit: 1995  Current Medications (verified): 1)  Spiriva Handihaler 18 Mcg  Caps (Tiotropium Bromide Monohydrate) .... Inhale Contents of 1 Capsule Once A Day 2)  Advair Diskus 250-50 Mcg/dose  Misc (Fluticasone-Salmeterol) .... Inhale 1 Puff Two Times A Day 3)  Ventolin Hfa 108 (90 Base) Mcg/act  Aers (Albuterol Sulfate) .[name].. 1-2 Puffs Every 4-6 Hours As Needed 4)  Atenolol 50 Mg  Tabs (Atenolol) .... Take 1 Tablet By Mouth Once A Day 5)  Furosemide 40 Mg Tabs (Furosemide) .... Take One Tablet By Mouth Daily As Needed 6)  Finasteride 5 Mg Tabs (Finasteride) .... Take 1 Tablet By Mouth Once A Day 7)  Omeprazole 20 Mg Cpdr (Omeprazole) .... Take One Tablet By Mouth in Am. 8)  Aspirin Ec 325 Mg Tbec (Aspirin) .... Take One Tablet By Mouth Daily  Allergies (verified): No Known Drug Allergies  Social History: Packs/Day:  2.5  Review of Systems       The patient complains of shortness of breath with activity, productive cough, non-productive cough, and joint stiffness or pain.  The patient denies shortness of breath at rest, coughing up blood, chest pain, irregular heartbeats, acid  heartburn, indigestion, loss of appetite, weight change, abdominal pain, difficulty swallowing, sore throat, tooth/dental problems, headaches, nasal congestion/difficulty breathing through nose, sneezing, itching, ear ache, anxiety, depression, hand/feet swelling, rash, change in color of mucus, and fever.    Vital Signs:  Patient profile:   77 year old male Height:      74 inches Weight:      211.38 pounds O2 Sat:      95 % on Room air Temp:     98.6 degrees F oral Pulse rate:   70 / minute BP sitting:   130 / 50  (right arm) Cuff size:   regular  Vitals Entered By: Carver Fila (May 26, 2010 9:32 AM)  O2 Flow:  Room air CC: 6 month follow up. pt states he has SOB wth activity and is a little worse than last time and also at rest sometimes. pt c/o occasional cough and coughs up more phlem in the AM Comments meds and allergies updated Phone number updated Carver Fila  May 26, 2010 9:34 AM    Physical Exam  General:  wd male in nad Lungs:  very mild decrease in bs, no wheezing or rhonchi Heart:  rrr, no mrg Extremities:  mild ankle/pedal edema, no cyanosis  Neurologic:  alert and oriented, moves all 4.   Impression & Recommendations:  Problem # 1:  EMPHYSEMA,  MILD (ICD-492.8) The pt is doing well from an emphysema standpoint.  He has had no acute exacerbation, and no recent pulmonary infections  His limited walking is more due to his chronic back and hip pain than anything else.  He also has pedal edema today, and I have asked him to monitor this carefully and take his lasix as prescribed by Dr. Myrtis Ser.    Other Orders: Est. Patient Level III (16109) Influenza Vaccine MCR (60454)  Patient Instructions: 1)  stay on current meds 2)  stay as active as possible 3)  watch fluid balance, and take fluid pill as directed by Dr Myrtis Ser 4)  will give flu shot today 5)  followup with me in 6mos.  Prescriptions: SPIRIVA HANDIHALER 18 MCG  CAPS (TIOTROPIUM BROMIDE MONOHYDRATE)  Inhale contents of 1 capsule once a day  #30 Capsule x 6   Entered and Authorized by:   Barbaraann Share MD   Signed by:   Barbaraann Share MD on 05/26/2010   Method used:   Print then Give to Patient   RxID:   [id number]     Immunizations Administered:  Influenza Vaccine # 1:    Vaccine Type: Fluvax MCR    Site: left deltoid    Mfr: GlaxoSmithKline    Dose: 0.5 ml    Route: IM    Given by: Kandice Hams CMA    Exp. Date: 03/14/2011    Lot #: [id number]    VIS given: 04/08/10 version given May 26, 2010.  Flu Vaccine Consent Questions:    Do you have a history of severe allergic reactions to this vaccine? no    Any prior history of allergic reactions to egg and/or gelatin? no    Do you have a sensitivity to the preservative Thimersol? no    Do you have a past history of Guillan-Barre Syndrome? no    Do you currently have an acute febrile illness? no    Have you ever had a severe reaction to latex? no    Vaccine information given and explained to patient? yes

## 2010-10-14 NOTE — Progress Notes (Signed)
Summary: Pulmonary OV/[facility]  Pulmonary OV/ HealthCare   Imported By: Sherian Rein 05/27/2009 07:22:50  _____________________________________________________________________  External Attachment:    Type:   Image     Comment:   External Document

## 2010-10-14 NOTE — Miscellaneous (Signed)
Summary: Orders Update  Clinical Lists Changes  Medications: Added new medication of OMEPRAZOLE 20 MG CPDR (OMEPRAZOLE) take one tablet by mouth in am. - Signed Rx of OMEPRAZOLE 20 MG CPDR (OMEPRAZOLE) take one tablet by mouth in am.;  #30 x 6;  Signed;  Entered by: Ollen Gross, RN, BSN;  Authorized by: Talitha Givens, MD, [facility];  Method used: Electronically to [facility] [address] [phone]*, [address], [place], [place]  [address], Ph: [phone] or [phone], Fax: [phone]    Prescriptions: OMEPRAZOLE 20 MG CPDR (OMEPRAZOLE) take one tablet by mouth in am.  #30 x 6   Entered by:   Ollen Gross, RN, BSN   Authorized by:   Talitha Givens, MD, [facility]   Signed by:   Ollen Gross, RN, BSN on 05/07/2009   Method used:   Electronically to        [facility] [address] (retail)       [address]       [place], [place]  [address]       Ph: [phone] or [phone]       Fax: [phone]   RxID:   [id number]

## 2010-10-14 NOTE — Consult Note (Signed)
Summary: Pulmonary Consult/[facility]  [facility]   Imported By: Sherian Rein 05/27/2009 07:21:14  _____________________________________________________________________  External Attachment:    Type:   Image     Comment:   External Document

## 2010-11-21 ENCOUNTER — Encounter: Payer: Self-pay | Admitting: Cardiology

## 2010-11-21 ENCOUNTER — Other Ambulatory Visit: Payer: Self-pay | Admitting: Cardiology

## 2010-11-21 ENCOUNTER — Encounter: Payer: Self-pay | Admitting: Physician Assistant

## 2010-11-21 ENCOUNTER — Other Ambulatory Visit (INDEPENDENT_AMBULATORY_CARE_PROVIDER_SITE_OTHER): Payer: MEDICARE

## 2010-11-21 DIAGNOSIS — I10 Essential (primary) hypertension: Secondary | ICD-10-CM

## 2010-11-21 DIAGNOSIS — R252 Cramp and spasm: Secondary | ICD-10-CM

## 2010-11-21 LAB — BASIC METABOLIC PANEL
BUN: 18 mg/dL (ref 6–23)
CO2: 27 mEq/L (ref 19–32)
Calcium: 8.5 mg/dL (ref 8.4–10.5)
Chloride: 108 mEq/L (ref 96–112)
Creatinine, Ser: 1.2 mg/dL (ref 0.4–1.5)
GFR: 65.03 mL/min (ref >60.00)
Glucose, Bld: 144 mg/dL — ABNORMAL HIGH (ref 70–99)
Potassium: 4.1 mEq/L (ref 3.5–5.1)
Sodium: 141 mEq/L (ref 135–145)

## 2010-11-25 NOTE — Miscellaneous (Signed)
Summary: Orders Update  Clinical Lists Changes  Problems: Added new problem of LEG CRAMPS (ICD-729.82) Orders: Added new Test order of TLB-BMP (Basic Metabolic Panel-BMET) (80048-METABOL) - Signed

## 2010-11-28 ENCOUNTER — Encounter: Payer: Self-pay | Admitting: Pulmonary Disease

## 2010-11-28 ENCOUNTER — Ambulatory Visit (INDEPENDENT_AMBULATORY_CARE_PROVIDER_SITE_OTHER): Payer: BC Managed Care – PPO | Admitting: Pulmonary Disease

## 2010-11-28 DIAGNOSIS — J438 Other emphysema: Secondary | ICD-10-CM

## 2010-11-28 DIAGNOSIS — R0989 Other specified symptoms and signs involving the circulatory and respiratory systems: Secondary | ICD-10-CM

## 2010-11-28 DIAGNOSIS — R0609 Other forms of dyspnea: Secondary | ICD-10-CM

## 2010-12-11 NOTE — Assessment & Plan Note (Signed)
Summary: rov for copd, dyspnea   Copy to:  Harlow Mares  M.D. Primary Provider/Referring Provider:  Lindwood Qua, MD  CC:  6 month f/u appt.  Pt states he is coughing more amounts of sputum- thick and white.  Pt also c/o increased sob with exertion and "pains in joints."  Pt would like a nasal spray to help with nasal congestion that he has at night.  Pt also c/o cramps in hands and legs at night.   Pt states he is not taking his Spiriva and Advair on a daily basis. [name]  History of Present Illness: the pt comes in today for f/u of his known emphysema.  He has a little more doe than his usual baseline, but he is not taking his inhaler regimen compliantly.  He has no congestion or purulence, but does have a little more cough with thick white mucus which on history may be coming from postnasal drip.  He also describes "cramping" in his legs at night, but his potassium recently is normal.  There is some suggestion this may be RLS?  Current Medications (verified): 1)  Spiriva Handihaler 18 Mcg  Caps (Tiotropium Bromide Monohydrate) .... Inhale Contents of 1 Capsule Once A Day 2)  Advair Diskus 250-50 Mcg/dose  Misc (Fluticasone-Salmeterol) .... Inhale 1 Puff Two Times A Day 3)  Ventolin Hfa 108 (90 Base) Mcg/act  Aers (Albuterol Sulfate) .[name].. 1-2 Puffs Every 4-6 Hours As Needed 4)  Atenolol 50 Mg  Tabs (Atenolol) .... Take 1 Tablet By Mouth Once A Day 5)  Furosemide 40 Mg Tabs (Furosemide) .... Take One Tablet By Mouth Daily As Needed 6)  Finasteride 5 Mg Tabs (Finasteride) .... Take 1 Tablet By Mouth Once A Day 7)  Omeprazole 20 Mg Cpdr (Omeprazole) .... Take One Tablet By Mouth in Am.  Can Take An Additional Tab in The Evening As Needed 8)  Aspirin Ec 325 Mg Tbec (Aspirin) .... 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 PTCA RCA FOR IN-STENT RESTENOSIS 6/01../ Myoview done February, 2010 showed no ischemia.   EMPHYSEMA, MILD (ICD-492.8) GERD CERVICAL DISK DISEASE S/P NEUROSURGERY BPH HX COLON POLPS PARALYZED RIGHT HEMIDIAPHRAGM chronic Carotid artery disease mild  /   Doppler... February 27, 2010... 0-39% bilateral ICA Colon polyps BPH REMOTE TOBACCO good LV function Chronically elevated CPK (mild) Morning fatigue   August, 2011  Review of Systems       The patient complains of shortness of breath with activity, productive cough, acid heartburn, indigestion, nasal congestion/difficulty breathing through nose, sneezing, and joint stiffness or pain.  The patient denies shortness of breath at rest, non-productive cough, coughing up blood, chest pain, irregular heartbeats, loss of appetite, weight change, abdominal pain, difficulty swallowing, sore throat, tooth/dental problems, headaches, itching, ear ache, anxiety, depression, hand/feet swelling, rash, change in color of mucus, and fever.    Vital Signs:  Patient profile:   77 year old male Height:      74 inches Weight:      207.38 pounds BMI:     26.72 O2 Sat:      97 % on Room air Temp:     97.5 degrees F oral Pulse rate:   65 / minute BP sitting:   136 / 72  (left arm) Cuff  size:   regular  Vitals Entered By: Arman Filter LPN (November 28, 2010 9:41 AM)  O2 Flow:  Room air CC: 6 month f/u appt.  Pt states he is coughing more amounts of sputum- thick and white.  Pt also c/o increased sob with exertion and "pains in joints."  Pt would like a nasal spray to help with nasal congestion that he has at night.  Pt also c/o cramps in hands and legs at night.   Pt states he is not taking his Spiriva and Advair on a daily basis.  Comments Medications reviewed with patient Arman Filter LPN  November 28, 2010 9:41 AM    Physical Exam  General:  wd male in nad    Lungs:  mild decrease in bs, no wheezing noted Heart:  rrr Extremities:  no edema or cyanosis  Neurologic:  alert and oriented, moves all 4    Impression & Recommendations:  Problem # 1:  EMPHYSEMA, MILD (ICD-492.8) the pt has known mild emphysema, but is having a little more doe.  However, he is not taking his bronchodilator regimen compliantly.  It is unclear if his worsening symptoms have anything to do with his obstructive lung disease, or whether this is simply related to advancing age and deconditioning.  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  I have asked the pt to try getting back on his inhalers more compliantly to see if things improve.    Medications Added to Medication List This Visit: 1)  Omeprazole 20 Mg Cpdr (Omeprazole) .... Take one tablet by mouth in am.  can take an additional tab in the evening as needed  Other Orders: Est. Patient Level III (16109)  Patient Instructions: 1)  try eating a banana every am 2)  pay attention to whether leg movement makes your "cramping" better at night.  If it does, please call me. 3)  try zyrtec 10mg  over the counter at bedtime for your nose if having a lot of allergy problems 4)  can try nasonex 2 sprays in each nostril at bedtime to see if helps congestion. 5)  take your inhalers regularly to see if breathing improves.  If your breathing continues to worsen, I need to know.   6)  followup with me in 6mos   Prescriptions: VENTOLIN HFA 108 (90 BASE) MCG/ACT  AERS (ALBUTEROL SULFATE) 1-2 puffs every 4-6 hours as needed  #1 x 6   Entered and Authorized by:   Barbaraann Share MD   Signed by:   Barbaraann Share MD on 11/28/2010   Method used:   Print then Give to Patient   RxID:   [id number] ADVAIR DISKUS 250-50 MCG/DOSE  MISC (FLUTICASONE-SALMETEROL) Inhale 1 puff two times a day  #60 Each x 6   Entered and Authorized by:   Barbaraann Share MD   Signed by:   Barbaraann Share MD on 11/28/2010   Method used:   Print then Give to Patient   RxID:   [id number] SPIRIVA HANDIHALER 18 MCG  CAPS (TIOTROPIUM BROMIDE MONOHYDRATE) Inhale contents of 1 capsule once a day  #30 Capsule x 6   Entered and Authorized by:   Barbaraann Share MD   Signed by:   Barbaraann Share MD on 11/28/2010   Method used:   Print then Give to Patient   RxID:   [id number]

## 2011-01-27 NOTE — Assessment & Plan Note (Signed)
[facility]ALTHCARE                            CARDIOLOGY OFFICE NOTE   SLAYTON, LUBITZ                     MRN:          [id number]  DATE:02/25/2007                            DOB:          [birth year]    Tim [name] is doing well.  We had adjusted his diuretic dose slightly  at the time of the last visit and he is doing well.  In fact, he has  learned to titrate his meds.  He takes 40 of Lasix daily.  If he is out  and about and sweating a lot, he does not take any more.  If he is in  and taking it easy and there is question of any edema, he takes an extra  20 and he has done very well with this.  We will continue this and we  will check a BMET.   He also continues to see Dr. Shelle Iron for his lungs and he has seen Dr.  Larey Dresser for his prostate.   PAST MEDICAL HISTORY:   ALLERGIES:  No known drug allergies.   MEDICATIONS:  1. Spiriva.  2. Nexium.  3. Flomax.  4. Advair.  5. Proventil.  6. Atenolol 50.  7. Aspirin 325.  8. Avodart.  9. Furosemide 40 in the morning and sometimes 20 later in the day.   OTHER MEDICAL PROBLEMS:  See the complete list below.   REVIEW OF SYSTEMS:  He is doing well.  He is not having any chest pain  or shortness of breath.  His review of systems is quite stable.  He is  holding his aspirin because of some skin surgery.  Otherwise, review of  systems is negative.   PHYSICAL EXAM:  Weight is 209 pounds.  Blood pressure 132/76 and a pulse  of 70.  The patient is oriented to person, time and place.  He is in the  room with his wife.  Affect is normal.  HEENT:  Reveals no xanthelasma.  He has normal extraocular motion.  There are no carotid bruits.  There is no jugular venous distention.  LUNGS:  Clear.  Respiratory effort is not labored.  ABDOMEN:  His abdomen is soft.  There are no masses or bruits.  He has  normal bowel sounds.  There is no significant peripheral edema at this  time.   EKG reveals no  significant change.   PROBLEMS INCLUDE:  1. Coronary artery disease.  Last exercise test was in November of      2007.  Patient had a good result.  There was a small, fixed defect      at the base of the inferolateral wall.  There was no ischemia and      LV function was good.  He is on aspirin.  He cannot tolerate a      statin.  We have tried on many occasions and he gets an elevated      CPK.  2. History of good LV function.  3. History of mildly elevated CPK.  4. Dyslipidemia with inability to use medications because of his CPK  and because of significant symptoms.  5. GERD.  6. Hypertension.  7. Sensation of hearing a heartbeat in his ear, which is stable.  8. Moderate emphysema, followed by Dr. Shelle Iron.  9. History of a paralyzed right hemidiaphragm.  10.History of cervical disc disease with radicular symptoms that      improved after neurosurgery.  11.BPH, followed by Dr. Larey Dresser.  12.History of colon polyps.  13.History of very minor carotid disease.  He had a Doppler in October      of 2006 with very minimal disease.  We will arrange for followup      after we see him next time.   [name] is stable.  We will check a BMET today because of his  diuretic use.  Otherwise, no other tests are needed.  I will see him  back in one year.     [name], MD, [facility]  Electronically Signed    JDK/MedQ  DD: 02/25/2007  DT: 02/25/2007  Job #: [id number]   cc:   Andi Devon, [place] Dr. Elenor Legato. Vonita Moss, M.D.  Barbaraann Share, MD,FCCP

## 2011-01-27 NOTE — Assessment & Plan Note (Signed)
[facility] HEALTHCARE                            CARDIOLOGY OFFICE NOTE   Tim [name]                     MRN:          [id number]  DATE:02/20/2008                            DOB:          1929/07/29    [name] is seen for followup.  See my note of Feb 01, 2008.  At  that time, he had some shortness of breath.  Also, I felt that he needed  carotid Dopplers.  These studies were done.  He has very mild bilateral  carotid disease.  This can be repeated in 2 years.  A 2-D echo reveals  good LV function.  Ejection fraction is 55% to 65%.  There is mild  mitral regurgitation.  There is no significant pulmonary hypertension.   Today, he is seen back.  He is quite active.  He is going about full  activities.  His symptoms remain the same.  He does have significant  lung disease.   PAST MEDICAL HISTORY:   ALLERGIES:  No known drug allergies.   MEDICATIONS:  Spiriva, Nexium, Advair, Proventil, atenolol, aspirin and  Avodart.   OTHER MEDICAL PROBLEMS:  See the complete list on my note of Feb 01, 2008.   REVIEW OF SYSTEMS:  Today, he feels well other than the some shortness  of breath as noted in the HPI.   Otherwise, his review of systems is negative.   PHYSICAL EXAMINATION:  Blood pressure is 148/79 with a pulse of 66.  The patient is oriented to person, time, and place.  Affect is normal.  HEENT:  No xanthelasma.  He has normal extraocular motion.  There are no carotid bruits.  There is no jugular venous distention.  LUNGS:  Some decreased breath sounds.  There is no respiratory distress.  CARDIAC:  S1 and S2. There are no clicks or significant murmurs.  ABDOMEN:  Soft.  He has no significant peripheral edema.   Labs that were done are outlined above including the 2-D echo and the  carotid Doppler since his last visit.   Problems are listed on the note of Feb 01, 2008.  #1.  Coronary artery disease.  I believe he is stable, and I have  decided not to repeat his exercise testing at this time.  #2.  History of good left ventricular function and this remains the same  by echo.  #8.  Moderate emphysema.  I suspect that this is his major problem.  He  also has a paralyzed right hemidiaphragm.  #13.  History of mild carotid disease.  His Dopplers are stable and  could be followed up in 2 years.   I believe that he is stable.  I will see him back in 1 year.  He is to  go about full activities.  If he has any change in his symptoms or  worsening, we will consider more aggressive followup of his coronary  status.     Tim Abed, MD, [facility]  Electronically Signed    Tim [name]/MedQ  DD: 02/20/2008  DT: 02/21/2008  Job #: [id number]   cc:   Tim [name]  Tim March.  Tim Share, MD,FCCP  Tim [name], M.D.

## 2011-01-27 NOTE — Assessment & Plan Note (Signed)
[facility] HEALTHCARE                            CARDIOLOGY OFFICE NOTE   MUNACHIMSO, PALIN                     MRN:          [id number]  DATE:02/01/2008                            DOB:          1928-10-13    [name] is doing well overall.  I saw him last in June of 2008.  We  adjusted his diuretic and he is stable.  If he takes diuretics, however,  he gets some cramping in the evening.  I have suggested he try some  tonic water as we have seen that this helps with some people.  He sees  Dr. Shelle Iron for his lungs.  He has some exertional shortness of breath.  This is a chronic problem.  He has had some chest tightness.  He had a  Myoview in November of 2007 that showed no ischemia.  He has not had an  echo in a long time.  The patient is not having any PND or orthopnea.  His shortness of breath is more with exertion.  We know that he has  significant lung disease.  He has not had a cough.  There has been so  syncope or presyncope.   PAST MEDICAL HISTORY:   ALLERGIES:  No known drug allergies.   MEDICATIONS:  Spiriva, Nexium, Advair, Proventil, Atenolol, aspirin,  furosemide, Avodart.   OTHER MEDICAL PROBLEMS:  See the list below.   REVIEW OF SYSTEMS:  His review of systems is negative other than the  HPI.   PHYSICAL EXAMINATION:  Weight is 205 pounds which is stable.  Blood  pressure is 150/77 with a pulse of 71.  The patient is oriented to person, time, and place.  Affect is normal.  HEENT:  Reveals no xanthelasma.  He has normal extraocular  motion.  There are no carotid bruits.  There is no jugular venous distension.  LUNGS:  Clear.  Respiratory effort is not labored.  There are no rales.  His abdomen is soft.  CARDIAC EXAM:  Reveals an S1 with an S2 with a soft systolic murmur.  He has no peripheral edema.   EKG reveals sinus rhythm with no significant change.   PROBLEMS:  1. Coronary disease.  Exercise test done last in November of  2007.  He      had a small fixed defect.  He does have known coronary disease.  I      am not inclined to repeat his stress test at this point.  We follow      him carefully.  2. History of good left ventricular function.  3. History of mildly elevated CPK.  4. Dyslipidemia with inability to use statin because of elevated CPK.  5. Gastroesophageal reflux disease.  6. Hypertension.  7. Sensation of hearing his heartburn in his ear and this is stable.  8. Moderate emphysema, followed by Dr. Shelle Iron.  9. History of a paralyzed right hemidiaphragm.  These 2 issues clearly      play a role in his shortness of breath.  10.History of cervical disk disease.  11.Benign prostatic hypertrophy.  12.History of colon polyps.  13.History of mild carotid disease.  It is time now to arrange for      carotid Doppler.   With his shortness of breath and some chest tightness, I want to proceed  first with a 2-D echo to reassess his left ventricular function.  He  will also have carotid Dopplers.  I will then see him for followup.     [name], MD, [facility]  Electronically Signed    JDK/MedQ  DD: 02/01/2008  DT: 02/01/2008  Job #: [id number]   cc:   Wannetta Sender.  Barbaraann Share, MD,FCCP  Maretta Bees. Vonita Moss, M.D.

## 2011-01-27 NOTE — Assessment & Plan Note (Signed)
[facility] HEALTHCARE                            CARDIOLOGY OFFICE NOTE   ALMUS, WOODHAM                     MRN:          [id number]  DATE:10/31/2008                            DOB:          1929/01/02    The patient was seen in [facility] on October 31, 2008, for Dr.  Daleen Squibb.   PRIMARY CARDIOLOGIST:  [name], MD, [facility]   Mr. Rosenboom is a 77 year old married white male patient of Dr. Willa Rough, who walked in today because of concerns over some left arm pain he  has been having.  His wife is actually in the hospital over at [facility] and  he decided to come and get checked out.  He has a history of coronary  artery disease status post MI treated with stenting of the RCA in 2001.  He had in-stent restenosis treated with cutting balloon angioplasty in  June 2001.  At that time, he had total occlusion of his circumflex with  collateralization of an obtuse marginal.  His last cath was in 2004,  stent was opened, and there was no progression of coronary artery  disease.   The patient complains of a 3-week history of left arm pain and tingling.  He states it happens at any time a day and he can actually press on an  area of his wrist and reproduce the pain radiating up his left arm  towards his bicep region.  He has had trouble with carpal tunnel  syndrome in the past and is wondering if this may be an issue.  He spent  the night in a chair in the hospital last night and states his arm began  hurting when he woke up this morning.  He does have some tingling into  his left hand as well.  He denies any chest pain, tightness, burning  pressure, dyspnea, dyspnea on exertion, dizziness, or presyncope.  He  works as a Music therapist and never has had any chest pain, although he  claims he did not have when he had his MI.  The patient's arm pain  occurs at any time a day at rest and exertion does not worsen it.   CURRENT MEDICATIONS:  1. Spiriva  daily.  2. Atenolol 50 mg daily.  3. Aspirin 325 mg daily.  4. Avodart 0.5 mg daily.  5. Protonix 40 mg b.i.d.   PHYSICAL EXAMINATION:  GENERAL:  This is a pleasant 77 year old white  male in no acute distress.  VITAL SIGNS:  Blood pressure is 130/88, pulse 95, and weight 208.  NECK:  Without JVD, HJR, bruit, or thyroid enlargement.  LUNGS:  Decreased breath sounds, but clear anterior, posterior, and  lateral.  HEART:  Regular rate and rhythm at 90 beats per minute.  Normal S1 and  S2.  No murmur, rub, bruit, thrill, or heave noted.  ABDOMEN:  Soft  without organomegaly, masses, lesions, or abnormal tenderness.  EXTREMITIES:  Left arm, when I pressed on his wrist I can reproduce the  arm pain and tingling.  He does have good radial and ulnar pulses.  He  does have a lesion on his left upper arm, he is concerned about, that  looks like a possible skin cancer that has just crept up in the past  week.  Lower extremities without cyanosis, clubbing, or edema.  He has  good distal pulses.   EKG normal sinus rhythm, inferior Q-waves, and no acute change from  prior tracing.   IMPRESSION:  1. Left arm pain and tingling.  I suspect secondary to carpal tunnel      syndrome.  2. Coronary artery disease status post myocardial infarction treated      with stenting of the right coronary artery back in 2001 with      restenosis and cutting balloon angioplasty in June 2001 with total      occlusion of the circumflex with collateralizations of an obtuse      marginal.  Normal left ventricular function in 2004.  3. Chronic obstructive pulmonary disease with paralyzed right      hemidiaphragm.  4. History of mild carotid disease.   PLAN:  I suspect his arm pain is secondary to carpal tunnel syndrome.  I  have asked him to check with his primary care physician, Lindwood Qua  concerning this and also advised him to see his dermatologist concerning  his possible skin cancer in his left upper arm.   Concerning his heart,  he has not had stress test since 2003.  He was asymptomatic when he had  his MI.  I will, therefore, order a stress Myoview to make sure he does  not have any ischemia.  He will see Dr. Myrtis Ser back after his stress test.      Jacolyn Reedy, PA-C  Electronically Signed      Jesse Sans. Daleen Squibb, MD, [facility]  Electronically Signed   ML/MedQ  DD: 10/31/2008  DT: 10/31/2008  Job #: [id number]   cc:   Wannetta Sender.

## 2011-01-30 NOTE — Op Note (Signed)
NAMEDAROLD, MILEY              ACCOUNT NO.:  [id number]   MEDICAL RECORD NO.:  [id number]          PATIENT TYPE:  AMB   LOCATION:  CARD                         FACILITY:  [facility]   PHYSICIAN:  Barbaraann Share, MD,FCCPDATE OF BIRTH:  30[birth year]-1931   DATE OF PROCEDURE:  09/28/2006  DATE OF DISCHARGE:                               OPERATIVE REPORT   PROCEDURE:  Flexible fiberoptic bronchoscopy with transbronchial lung  biopsies as well as bronchial brushes.   INDICATION:  Right middle lobe infiltrate of unknown etiology refractory  to antibiotics.   SURGEON:  Dr. Barbaraann Share.   ANESTHESIA:  Versed 10 mg IV in various aliquots as well as Demerol 100  mg IV.  Also topical 1% lidocaine on vocal cords and airways during the  procedure.   DESCRIPTION OF PROCEDURE:  After obtaining informed consent and under  close cardiopulmonary monitoring, the above preop anesthesia was given  and the fiberoptic scope was passed through the right naris and into the  posterior pharynx where there was no lesions or other abnormalities  seen.  The vocal cords appeared to be within normal limits and moved  bilaterally on phonation.  The scope was then passed into the trachea  where it was examined along its entire length down to the level of  carina all of which was normal.  The left and right tracheobronchial  trees were examined serially to the subsegmental level with no obvious  endobronchial process being found.  Attention was then paid to the right  middle lobe bronchus where bronchoalveolar lavage as well as bronchial  brushes were done for appropriate specimens.  Transbronchial lung  biopsies x2 were then done under fluoroscopic guidance with good  specimens being obtained.  Good hemostasis was maintained throughout the  procedure.  A quick scanning with fluoroscopy after the procedure showed  no obvious pneumothorax.  The chest x-ray is pending to rule out  pneumothorax post transbronchial lung  biopsy.      Barbaraann Share, MD,FCCP  Electronically Signed     [facility]/MEDQ  D:  09/28/2006  T:  09/28/2006  Job:  [id number]

## 2011-01-30 NOTE — Op Note (Signed)
Tim [name], Tim [name]                          ACCOUNT NO.:  [id number]   MEDICAL RECORD NO.:  [id number]                   PATIENT TYPE:  OUT   LOCATION:  CARD                                 FACILITY:  [facility]   PHYSICIAN:  Oley Balm. Sung Amabile, M.D. [facility]          DATE OF BIRTH:  October 14, 1928   DATE OF PROCEDURE:  05/01/2004  DATE OF DISCHARGE:  04/22/2004                                 OPERATIVE REPORT   PROCEDURE:  Cardiopulmonary stress test.   INDICATION:  Evaluation of exertional dyspnea.   DESCRIPTION OF PROCEDURE:  Cardiopulmonary stress testing was performed on a  graded treadmill using a modified Bruce protocol.  Testing was stopped due  to reaching heart rate goal.  Effort was maximal.  At peak exercise, oxygen  uptake was 2.02 Lpm or 108% of predicted maximum, indicating normal exercise  tolerance.   At peak exercise, heart rate was 140 BPM or 95% of predicted maximum,  indicating that cardiovascular limitation was reached.  Oxygen pulse was  normal, suggesting normal left ventricular function.  Blood pressure  response was abnormal with peak blood pressure of 219/84.  EKG tracings  revealed no definite ischemic changes.  There were frequent PVCs during  recovery.   At peak exercise, minute ventilation was 69 Lpm or 105% of maximum voluntary  ventilation, indicating that the ventilatory limitation was reached.  Gas  exchange parameters revealed no abnormalities.  Baseline spirometry revealed  a mixed restrictive and obstructive pattern with restriction predominating.  Postexercise spirometry revealed no exercise-induced bronchospasm.   SUMMARY:  Normal exercise tolerance.  Simultaneous cardiovascular and  ventilatory limitation.  Hypertensive response to exercise.  Frequent PVCs  during recovery.  Restrictive greater than obstructive abnormalities on  pulmonary function tests.  No exercise-induced bronchospasm noted.                                               Oley Balm  Sung Amabile, M.D. [facility]    DBS/MEDQ  D:  05/01/2004  T:  05/02/2004  Job:  [id number]   cc:   Marcelyn Bruins, M.D. [facility]

## 2011-01-30 NOTE — Op Note (Signed)
NAMEMARLYN, Tim [name]              ACCOUNT NO.:  [id number]   MEDICAL RECORD NO.:  [id number]          PATIENT TYPE:  INP   LOCATION:  2899                         FACILITY:  [facility]   PHYSICIAN:  Payton Doughty, M.D.      DATE OF BIRTH:  May 09, 1929   DATE OF PROCEDURE:  08/26/2004  DATE OF DISCHARGE:                                 OPERATIVE REPORT   PREOPERATIVE DIAGNOSIS:  Spondylosis C3-4, C4-5,  C5-6.   POSTOPERATIVE DIAGNOSIS:  Spondylosis C3-4, C4-5,  C5-6.   OPERATION PERFORMED:  C3-4, C4-5, C5-6 anterior cervical decompression and  fusion with reflex hybrid plate.   SURGEON:  Payton Doughty, M.D.   ANESTHESIA:  General endotracheal.   PREP:  Sterile Betadine prep and scrub with alcohol wipe.   COMPLICATIONS:  None.   NURSE ASSISTANT:  [place].   DOCTOR ASSISTANT:  Hewitt Shorts, M.D.   INDICATIONS FOR PROCEDURE:  The patient is a 77 year old gentleman with  severe cervical spondylosis and myelopathy.   DESCRIPTION OF PROCEDURE:  The patient was taken to the operating room,  smoothly anesthetized, intubated, and placed supine on the operating table  in the halter head traction with the neck extended.  Following shave, prep  and drape in the usual sterile fashion, the skin was incised starting at the  level of the carotid tubercle, paralleling the sternocleidomastoid muscle on  the left side for approximately 8 cm.  The platysma was identified,  elevated, divided and undermined.  The sternocleidomastoid muscle was  identified and medial dissection revealed the carotid artery retracted  laterally to the left, tracheal and esophagus retracted laterally to the  right, exposing the bones of the anterior cervical spine.  Marker was  placed.  Intraoperative x-ray obtained confirming correctness of the level.  Having confirmed correctness of the level, the longus colli was taken down  bilaterally and the Shadowline self-retaining retractor placed transverse  and  cephalocaudal direction.  Diskectomy was carried out at 3-4, 4-5 and 5-6  under gross observation.  The operating microscope was then brought and we  used microdissection technique to remove the disk, divide the posterior  longitudinal ligament, remove the bone spurs and expose the neural foramina  bilaterally.  Disease was worse to the right than to the left.  It was worse  at 4-5 than at 3-4 and then at 5-6.  Following complete decompression at all  levels, 8 mm bone grafts were fashioned of patellar allograft and tapped  into place.  54 mm Reflex hybrid plate was then placed using 12 mm screws,  two at C3, two at C4, two at C5 and two at C6.  Intraoperative x-ray showed  good placement of bone grafts, plate and screws.  Wound was irrigated.  Hemostasis assured.  The esophagus inspected and found to be free of  lesions.  Platysma reapproximated with 3-0  Vicryl in interrupted fashion.  Subcutaneous tissue reapproximated with 3-0  Vicryl in interrupted fashion.  Skin was closed with 4-0 Vicryl in a running  subcuticular fashion.  Benzoin and Steri-Strips were placed and made  occlusive with Telfa  and OpSite.  The patient was placed in an Aspen collar  and returned to recovery room.       MWR/MEDQ  D:  08/26/2004  T:  08/26/2004  Job:  [id number]

## 2011-01-30 NOTE — H&P (Signed)
NAME:  Tim [name], Tim [name]              ACCOUNT NO.:  [id number]   MEDICAL RECORD NO.:  [id number]          PATIENT TYPE:  INP   LOCATION:  NA                           FACILITY:  [facility]   PHYSICIAN:  Payton Doughty, M.D.      DATE OF BIRTH:  1928-09-27   DATE OF ADMISSION:  08/26/2004  DATE OF DISCHARGE:                                HISTORY & PHYSICAL   PREOPERATIVE DIAGNOSIS:  Cervical spondylosis with myelopathy at C3-4, C4-5  and C5-6.   SERVICE:  Neurosurgery.   BODY OF TEXT:  [age]-year-old right-handed white gentleman for years  had increasing difficulty with respirations and would get short of breath.  Dr. Willa Rough discovered it was not related to his heart, but he does  have some coronary artery disease.  Dr. 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  An MR of his cervical spine showed cervical  spondylosis and compression of the cord at 3-4, 4-5 and 5-6.  He has noticed  weakness and clumsiness of the right hand, not so much on the left.  He has  a burning sensation across the left pectoral region and straight through on  his back.  His bladder has not been affected.   PAST MEDICAL HISTORY:  Medical history is remarkable for coronary artery  disease.  He has a stent in place.  He had a heart cath as recently as a  year ago.   MEDICATIONS:  1.  Atenolol 25 mg a day.  2.  Plavix 75 mg a day.  3.  Flomax 0.4 mg a day.  4.  Bayer Aspirin a day.  5.  Prilosec 20 mg a day.   ALLERGIES:  He has no allergies.   SURGICAL HISTORY:  Right mastectomy 4 years ago.   SOCIAL HISTORY:  He does not smoke, does not drink and retired as a  Music therapist, and Mudlogger.   FAMILY HISTORY:  Mom died at 64.  Daddy died at 32.  He has a brother with  lung cancer.   REVIEW OF SYSTEMS:  Review of systems is remarkable for glasses, tinnitus,  nasal congestion, shortness of breath, difficulty in starting and stopping  urine, arthritis, neck pain and  low back pain.   PHYSICAL EXAMINATION:  HEENT:  His HEENT exam is within normal limits.  NECK:  His range of motion in his neck is somewhat limited, although he does  not have a Lhermitte's.  CHEST:  Chest is clear.  He has lesions on his chest and thorax consistent  with shingles.  CARDIAC:  Regular rate and rhythm.  NEUROLOGIC:  He is awake, alert and oriented.  His cranial nerves are  intact.  Motor exam demonstrates 5/5 strength throughout the upper  extremities and diminution in grip related to arthritis.  Reflexes are  absent at the biceps and triceps, 1 at the brachioradialis.  Hoffmann's is  negative.  Lower extremities are non-myelopathic.   IMAGING STUDIES:  He comes accompanied with an MR that demonstrates severe  spinal stenosis at 4-5 and to a slightly lesser extent  at 3-4 and 5-6.  At 6-  7, the canal is adequate.   CLINICAL IMPRESSION:  Cervical spondylosis with phrenic nerve compromise.  It is reasonable to go ahead and decompress and fuse this gentleman.  The  risks and benefits have been discussed with him and he wishes to proceed.       MWR/MEDQ  D:  08/26/2004  T:  08/26/2004  Job:  [id number]

## 2011-01-30 NOTE — Discharge Summary (Signed)
[facility]  Patient:    Tim [name], Tim [name]                       MRN: [id number] Adm. Date:  03/10/00 Disc. Date: 03/12/00 Attending:  Cecil Cranker, M.D. [facility] Dictator:   Rozell Searing, P.A. CC:         [facility]                           Discharge Summary  PROCEDURES:  Coronary angiogram/PTCA RCA March 11, 2000.  REASON FOR ADMISSION:  Mr. Samaan is a 77 year old male, a patient of Dr. Lovena Neighbours, with previously documented CAD, who recently presented to our office for evaluation of recurrent chest discomfort.  He was admitted for further diagnostic evaluation.  Please refer to dictated admission note for full details.  LABORATORY DATA:  Cardiac enzymes:  Negative.  Serial CPK/MB:  Peak troponin-I 0.19 on admission.  Normal CBC and metabolic profile save for elevated glucose 150.  Admission CXR:  NAD.  HOSPITAL COURSE:  The patient was admitted directly to [facility], placed on IV heparin, and plans were to proceed with diagnostic coronary angiogram. Cardiac enzymes were notable for negative CPK/MBs but elevated troponin-I (0.19 on admission) with subsequent trending downward.  The following day, the patient underwent coronary angiogram by Dr. Nedra Hai (see catheterization report for full details) revealing long 70% mid in-stent restenosis at prior stent RCA site.  Remaining anatomy notable for occluded proximal OM2 and 50% distal LAD.  LV-gram:  EF greater than 55%; no mitral regurgitation.  Dr. Chales Abrahams proceeded with PTCA dilatation of the 70% in-stent lesion to less than 10% residual stenosis.  No complications were noted and the patient was cleared for discharge the following morning in stable condition.  At discharge, renal function normal with potassium of 3.5.  MEDICATION ADJUSTMENTS:  No new medications added this hospitalization.  DISCHARGE MEDICATIONS: 1. Tenormin 12.5 mg b.i.d. 2. Flomax 0.4 mg q.d. 3. Baycol 0.4 mg  q.d. 4. Aspirin 325 mg q.d. 5. Nitrostat as directed.  DISCHARGE INSTRUCTIONS:  The patient is to refrain from heavy lifting/driving x 2 days and maintain a low fat/cholesterol diet.  He is to call our office if there is any bleeding/swelling at the groin.  FOLLOW-UP:  The patient will follow-up with Dr. Lovena Neighbours on Thursday, March 30, 2000, at 3:30 p.m.  DISCHARGE DIAGNOSES: 1. Coronary artery disease progression.    a. Percutaneous transluminal coronary angioplasty 70% in-stent restenosis       prior stent right coronary artery site March 11, 2000.    b. Residual 100% proximal second obtuse marginal; 50% distal left anterior       descending.    c. Normal left ventricular function. 2. Hyperlipidemia. 3. Remote tobacco. DD:  03/12/00 TD:  03/12/00 Job: [id number]

## 2011-01-30 NOTE — Cardiovascular Report (Signed)
NAMEJACQUELINE, Tim [name]                          ACCOUNT NO.:  [id number]   MEDICAL RECORD NO.:  [id number]                   PATIENT TYPE:  OIB   LOCATION:  2857                                 FACILITY:  [facility]   PHYSICIAN:  Salvadore Farber, M.D.             DATE OF BIRTH:  11-10-1928   DATE OF PROCEDURE:  06/06/2003  DATE OF DISCHARGE:  06/06/2003                              CARDIAC CATHETERIZATION   PROCEDURE:  Left heart catheterization, left ventriculography, coronary  angiography, abdominal aortography.   INDICATIONS:  Mr. Chhim is a 77 year old gentleman with known coronary  disease status post stenting of the mid RCA in 2001.  He subsequently had in-  stent restenosis treated with cutting balloon angioplasty in June 2001.  At  initial catheterization he was noted to have total occlusion of the  circumflex with collateralization of an obtuse marginal.  He has generally  done well.  However, over the past several weeks he has had chest discomfort  occurring both with exertion and at rest, though not reliably with exertion.  He has also noted some mild bipedal edema.  No orthopnea, PND.  Based on  these new symptoms and his known coronary disease, he is referred for  diagnostic angiography.   PROCEDURAL TECHNIQUE:  Informed consent was obtained.  Under 1% lidocaine  local anesthesia a 6-French sheath was placed in the right femoral artery  using the modified Seldinger technique.  Diagnostic angiography and  ventriculography were performed using JL4, JR4, and pigtail catheters.  The  pigtail catheter was then pulled back to the suprarenal abdominal aorta and  aortography performed by power injections.  The patient tolerated the  procedure well and was transferred to the holding room in stable condition.   COMPLICATIONS:  None.   FINDINGS:  1. LV 128/10/23.  EF 70% without regional wall motion abnormality.  2. No aortic stenosis or mitral regurgitation.  3. Left main:   Angiographically normal.  4. LAD:  The LAD is a large vessel wrapping around the apex of the heart     that supplies substantial portion of the inferior wall.  It gives rise to     a single diagonal branch which is large.  The mid LAD has focal 30%     stenosis just after the takeoff of the diagonal branch.  5. Ramus intermedius:  Moderate sized vessel which is angiographically     normal.  6. Circumflex:  Occluded proximally.  A large OM is well collateralized from     the left.  7  RCA:  The RCA is a large, dominant vessel.  There is a stent in the mid  vessel which has a diffuse 20% in-stent restenosis.  There is a 40% stenosis  just proximal to the stent.    IMPRESSION/RECOMMENDATIONS:  The patient with stable coronary disease.  Obtuse marginal remains well collateralized.  Suspect noncardiac etiology to  his chest  discomfort.  Will continue his medical therapy for secondary  prevention.                                               Salvadore Farber, M.D.    WED/MEDQ  D:  06/06/2003  T:  06/07/2003  Job:  [id number]   cc:   Willa Rough, M.D.   Lanier Prude, NP  [facility] [address] [place]

## 2011-01-30 NOTE — Assessment & Plan Note (Signed)
[facility]                              CARDIOLOGY OFFICE NOTE   Tim, [name]                     MRN:          [id number]  DATE:07/09/2006                            DOB:          09/09/1929    [name] is seen for cardiology followup.  I had seen him last in  February of 2007.  He has been having some chest discomfort.  He does have a  history of coronary disease.  The pain is not always exertional.  He does  not have any nausea or vomiting with it.  There is no shortness of breath.  There may be some radiation to the arm.  This has not limited his  activities.  It is of note that he also has reflux that we think has been  significant.  Historically he has been helped by Nexium.  More recently he  has not been able to get Nexium through his insurance company, and the  substitute does not work as well for him.   ALLERGIES:  No known drug allergies.   MEDICATIONS:  Spiriva, atenolol, aspirin, Prevacid, Flomax, furosemide,  Advair, multivitamin, calcium, flouride supplement and Proventil.   OTHER MEDICAL PROBLEMS:  See the list below.   REVIEW OF SYSTEMS:  As mentioned, he does have some reflux symptoms.  He  also has some symptoms of carpal tunnel in his left hand.  It is possible  that this is playing a role in his left arm discomfort.  The patient still  has a sensation of hearing his heartbeat in his ear.  This is stable.  He  has actually had a full ENT evaluation.  He has hearing aids.  He also had a  CT scan, and had no significant abnormality as he reports to me.  Otherwise,  his review of systems is negative.   PHYSICAL EXAMINATION:  GENERAL:  The patient is oriented to person, time and  place.  His affect is normal.  Weight is 209, blood pressure 122/78 with a pulse of 70.  HEENT:  Reveals no xanthelasma.  He has normal extraocular motion.  There  are no carotid bruits.  There is no jugular venous distention.  LUNGS:   Clear.  Respiratory effort is not labored.  CARDIAC:  Exam reveals an S1 with an S2. There are no clicks or significant  murmurs.  ABDOMEN:  Soft.  There are normal bowel sounds.  There is no mass or bruit.  EXTREMITIES:  The patient has no significant peripheral edema.  He has 2+  distal pulses.  There are no musculoskeletal deformities.   EKG today reveals no significant abnormality.   PROBLEM LIST:  1. Coronary disease.  The patient has had some recurring chest pain.  His      last exercise test was in 2003.  It is appropriate to proceed now with      a Myoview scan, and he will walk on the treadmill.  I will then be in      touch with him with the results.  2. History of good left ventricular function.  3. History of mildly elevated CPK.  4. Dyslipidemia with inability to use any medications because of his CPKs      and symptoms.  5. Gastroesophageal reflux disease.  6. Hypertension, treated.  7. Sensation of hearing heartbeat in his ear, see the discussion above,      this is stable.  8. Moderate emphysema, followed by Dr. Shelle Iron.  9. Paralyzed right hemidiaphragm.  10.History of cervical disk disease with some radicular symptoms that      improved after neurosurgery.  11.Benign prostatic hypertrophy.  12.History of a dry mouth.  13.History of colon polyps.  14.History of very minor carotid disease.  He did have a Doppler that was      done in October of 2006 showing a 0% to 39% stenoses bilaterally.  This      can be followed over time.   I believe overall he is stable.  However, he does have coronary disease, and  he is having some chest discomfort.  We will do a stress Myoview scan.  I  will then see him back for followup.   Also, the patient appears to get symptomatic relief from gastroesophageal  reflux disease from Nexium.  This is the drug of choice for him.  We will  document this and push hard to get his insurance company to help him with  this.     ______________________________  Tim Abed, MD, [facility]    JDK/MedQ  DD: 07/09/2006  DT: 07/10/2006  Job #: [id number]

## 2011-01-30 NOTE — Cardiovascular Report (Signed)
Tim [name]. [facility]  Patient:    Tim [name], Tim [name] Visit Number: [id number] MRN: [id number]          Service Type: Attending:  Daisey Must, M.D. [facility] Proc. Date: 12/01/99   CC:         Cardiac Catheterization Laboratory             Domingo Cocking, M.D. [facility]                        Cardiac Catheterization  PROCEDURE PERFORMED: 1. Left heart catheterization with coronary angiography and left ventriculography. 2. Percutaneous transluminal coronary angioplasty with stent placement in the    mid-right coronary artery. 3. Attempt to cross a total occlusion of the mid-left circumflex coronary artery.  CARDIOLOGIST:  Daisey Must, M.D.  INDICATIONS:  Tim [name] is a 77 year old male who presented several days ago to [facility] with chest pain.  He ruled in for a small non-Q-wave myocardial infarction.  He was transferred to [facility] for a cardiac catheterization.  CATHETERIZATION PROCEDURE:  A 6-French sheath was placed in the right femoral artery.  The standard Judkins 6-French catheters were utilized.  Contrast was Omnipaque.  There were no complications.  RESULTS: HEMODYNAMICS: Left ventricular pressure:  100/14. Aortic pressure:  100/58.  There is no aortic valve gradient.  LEFT VENTRICULOGRAM:  There is mild hypokinesis of the posterolateral wall, otherwise normal wall motion.  The ejection fraction is estimated at 60%.  CORONARY ARTERIOGRAPHY:  (Right dominant): 1. Left main coronary artery:  Has a proximal 20% stenosis and a distal 20%    stenosis. 2. Left anterior descending coronary artery:  Has a proximal 40% stenosis, mid-30%    stenosis, followed by a second tubular 30% stenosis in the midvessel.  There    is a large first diagonal branch which has a 30% stenosis at its origin.  A    small second diagonal, a small third diagonal branch. 3. Left circumflex coronary artery:  Gives rise to a  normal-sized ramus    intermedius, a small OM-I and a small OM-II.  The circumflex is 100% occluded    just after the origin of the OM-II.  There is a large OM-III which fills    retrograde via the left to left collaterals. 4. Right coronary artery:  Is a dominant vessel.  There is a 30% stenosis in the    proximal vessel.  The midvessel has a tubular 75% stenosis, followed by an    80% stenosis.  The distal vessel has a 30% stenosis.  The right coronary    artery gives rise to a normal-sized posterior descending artery and two small    posterolateral branches.  IMPRESSION: 1. Preserved left ventricular systolic function with mild posterolateral    hypokinesis. 2. Two-vessel coronary artery disease as described.  The culprit lesion is the    100% occlusion of the mid-left circumflex, which occurs at the bifurcation    of OM-II.  There is a large OM-III beyond this occlusion that fills via    collaterals.  There is also significant disease in the mid-right coronary    artery.  PLAN:  For a percutaneous intervention of the right coronary artery, and an attempt at percutaneous intervention of the left circumflex.  See below.  PERCUTANEOUS TRANSLUMINAL CORONARY ANGIOPLASTY:  Following the completion of the diagnostic catheterization, the 6-French sheath and right femoral artery were exchanged over a wire for  a 7-French sheath.  The patient was enrolled in the CRUISE study and randomized to treatment with Integrilin and ________.  We initially treated the right coronary artery.  We used a 7-French JR4 guiding catheter with side holes and a BMW wire.  Predilatation of the lesion was performed with a 3.5 mm x 20.0 mm Quantum Ranger.  This was initially placed across the 80% stenosis and inflated to 8 atmospheres.  It was then pulled back and placed across the 75% stenosis and inflated to 8 atmospheres.  We then placed a 3.5 mm x 33.0 mm Tetra stent which covered both lesions.  The  stent was deployed at 9 atmospheres. We then used a 3.5 mm x 20.0 mm Quantum Ranger to post-dilate the stent.  This wound was inflated to 18 atmospheres at the distal aspect of the stent, and 16 atmospheres at the proximal aspect of the stent.  Angiographic images revealed n excellent result with 0% residual stenosis and TIMI-3 flow into the distal vessel.  We then turned our attention to the left circumflex.  We used a 7-French Voda left #4 guiding catheter.  We initially attempted to pass a BMW wire across the lesion; however, this failed to cross because of a small side branch.  The wire would continually preferentially go into the side branch.  We then used a Choyce PT floppy wire which likewise was unsuccessful in crossing the lesion, again because of the presence of the side branch at the site of that occlusion.  At that point it was apparent that because of the technical aspects of the occlusion, i.e. the side branch occurring right at the site of occlusion as well as the length of the occlusion, that we would be unable to cross the occlusion with the wire.  We therefore opted to stop the procedure at that point.  The patient tolerated the procedure well and there were no complications.  RESULTS: 1. Successful percutaneous transluminal coronary angioplasty with stent    placement in the mid-right coronary artery, reducing a 75%, followed by    an 80% stenosis to 0% residual with TIMI-3 flow. 2. Unsuccessful attempt at passing a coronary guide wire past the occlusion    of the mid-left circumflex coronary artery.  PLAN:  Integrilin will be continued for 18 hours.  Plavix will be administered or four weeks.  The patient will be treated medically for his residual coronary artery disease.  As described above, there is collateral flow to the obtuse marginal beyond the occlusion of the midcircumflex, and the left ventricular function is  well-preserved. Attending:  Daisey Must, M.D. [facility] DD:  12/01/99 TD:  12/02/99 Job: [id number]

## 2011-01-30 NOTE — Assessment & Plan Note (Signed)
[facility]                            CARDIOLOGY OFFICE NOTE   Tim [name], Tim [name]                     MRN:          [id number]  DATE:11/30/2006                            DOB:          1929/09/03    Tim [name] is seen for cardiology followup.  He has been feeling well.  He has noted some extra swelling in his ankles.  He takes his Lasix 20  in the morning and 20 at noon.  He usually wakes up in the morning  without significant fluid but then reaccumulates significantly.  He is  not having any significant shortness of breath.   PAST MEDICAL HISTORY:  Allergies:  NO KNOWN DRUG ALLERGIES.  Medications:  Spiriva, Nexium, Flomax, Advair, Proventil, Furosemide 20  b.i.d., Atenolol, and aspirin 325.  Other medical problems:  See the list in my note of July 09, 2006.   REVIEW OF SYSTEMS:  He is actually feeling well other than the edema.  Review of systems otherwise is negative.   PHYSICAL EXAMINATION:  His weight today is 210 pounds on our scales.  Blood pressure is 128/77 with a pulse of 74.  The patient is oriented to  person, time and place and his affect is normal.  HEENT:  There is no xanthelasmas.  He has normal extraocular motion.  NECK:  There is no JVD.  There are no carotid bruits.  CARDIAC:  S1 and S2.  There is no significant murmurs.  ABDOMEN:  Soft.  EXTREMITIES:  He does have 1+ peripheral edema and he has 2+ distal  pulses.  MUSCULOSKELETAL:  There are no musculoskeletal deformities.   No labs were done today.   PROBLEMS FROM MY NOTE OF July 09, 2006:  1. Mild volume overload.  He has had this over time and has responded      to diuretics.  I believe that we need to push them a little higher      and then I will see him for followup.  We will consider further      cardiac workup as needed but it is not needed at this time.     [name], MD, [facility]  Electronically Signed    JDK/MedQ  DD: 11/30/2006  DT:  11/30/2006  Job #: [id number]

## 2011-01-30 NOTE — Cardiovascular Report (Signed)
Tim [name]. Tim [name]  Patient:    Tim [name], Tim [name]                       MRN: [id number] Proc. Date: 03/11/00 Adm. Date:  [id number] Attending:  Talitha [name] CC:         Tim [name], M.D. [facility]             Tim [name], M.D. [facility]             Tim [name], M.D. [facility]             Tim [name], M.D., [name]                        Cardiac Catheterization  PROCEDURES PERFORMED: 1. Left heart catheterization. 2. Left ventriculogram. 3. Selective coronary angiography. 4. PTCA of the right coronary artery.  DIAGNOSES: 1. Two-vessel coronary artery disease, with in-stent restenosis of the    right coronary artery. 2. Normal left ventricular systolic function.  HISTORY:  Tim [name] is a 77 year old white male with history of coronary artery disease, who has previously undergone percutaneous intervention with stenting of the mid right coronary artery in March 2001.  At that time the patient had chest discomfort and was found to have severe, diffuse disease in the mid RCA, as well as occlusion of the distal left circumflex artery.  The distal left circumflex artery could not be crossed with a wire, and it was considered chronic occlusion.  The mid RCA was treated with angioplasty and placement of a 3.5 x 33 mm Tetra stent.  The patient did well post-procedure; however, recently has had recurrence of exertional chest discomfort.  He presents now for left heart catheterization.  TECHNIQUE:  After informed consent was obtained, the patient was brought to the catheterization lab and both groins were sterilely prepped and draped. Lidocaine 1% was used to infiltrate the right groin.  A 6-French sheath was placed in the right femoral artery using the modified Seldinger technique. Six-French JL4 and JR4 catheters were then used to engage the left and right coronary arteries.  Selective angiography was performed in various projections using manual  injections of contrast.  A 6-French pigtail catheter was advanced to the left ventricle.  A left ventriculogram was performed using power injections of contrast.  INITIAL FINDINGS: 1. Left Main Trunk:  Large-caliber vessel with mild irregularities. 2. Left Anterior Descending:  This is a large-caliber vessel that provides a    large first diagonal branch in the proximal segment, a smaller second    diagonal branch in the mid section.  There is mild, diffuse disease in the    proximal mid LAD, with narrowing of 20%.  A focal narrowing of 50% is noted    in the distal LAD.  The first and second diagonal branches have mild    diffuse disease. 3. Left Circumflex Artery:  A small-caliber vessel that provides small first    marginal branch in the mid section and a medium-caliber second marginal    branch distally.  There is mild, diffuse disease in the proximal AV    circumflex.  The circumflex is then occluded after the first marginal    branch.  The second marginal branch is seen to fill via collateral flow    from the LAD. 4. Right Coronary Artery:  Dominant.  This is a large-caliber vessel that    provides a posterior descending  artery, as well as a posterior ventricular    branch in its terminal segment.  There is mild, diffuse disease in the    proximal and distal segments, with narrowings of not greater than 30%.    There is a long area of stenosis of 70% is noted in the mid section, within    the area of previous stenting. 5. Left Ventricle:  Normal end-systolic and end-diastolic dimensions.  Overall    left ventricular function is well preserved.  Ejection fraction is greater    than 55%.  No mitral regurgitation.  HEMODYNAMICS: 1. Left ventricular pressure:  130/10. 2. Aortic pressure:  130/60. 3. Left ventricular end-diastolic pressure:  17.  SUMMARY:  These findings were reviewed with the patient.  We elected to proceed with percutaneous intervention to the right coronary  artery.  PERCUTANEOUS INTERVENTION:  The 6-French sheath in the right femoral artery was exchanged over a wire for a 7-French sheath.  The patient was given heparin and Integrilin on a weight-adjusted basis to maintain ACT of greater than 250 sec.  A 7-French JL4 guide catheter was used to engage the right coronary artery.  A 0.014 inch Hi-Torque Floppy wire was advanced into the distal RCA.  A 3.5 x 15 mm cutting balloon was introduced.  Three inflations were performed within the distal section of stenosis at 6 atm for 30 sec each; three additional inflations were performed in the proximal segment at 6 atm for 30  sec.  Repeat angiography showed an excellent result, with only mild residual narrowing of less than 10% and significant improvement in vessel lumen.  Final angiography was performed at various projections showing TIMI-3 flow throughout the right coronary artery, and no evidence of vessel damage.   Then the guide wire was removed.  The sheath was secured into position.  The patient tolerated the procedure well and was transferred to the Floor in stable condition.  FINAL RESULTS: 1. Successful PTCA of the mid right coronary artery. 2. There was reduction of in-stent restenosis of 70% to less than 10% with    balloon angioplasty. DD:  03/11/00 TD:  03/12/00 Job: [id number]

## 2011-06-05 ENCOUNTER — Ambulatory Visit: Payer: BC Managed Care – PPO | Admitting: Pulmonary Disease

## 2011-06-10 ENCOUNTER — Encounter: Payer: Self-pay | Admitting: Pulmonary Disease

## 2011-06-10 ENCOUNTER — Ambulatory Visit (INDEPENDENT_AMBULATORY_CARE_PROVIDER_SITE_OTHER): Payer: Medicare Other | Admitting: Pulmonary Disease

## 2011-06-10 DIAGNOSIS — Z23 Encounter for immunization: Secondary | ICD-10-CM

## 2011-06-10 DIAGNOSIS — R0989 Other specified symptoms and signs involving the circulatory and respiratory systems: Secondary | ICD-10-CM

## 2011-06-10 DIAGNOSIS — J438 Other emphysema: Secondary | ICD-10-CM

## 2011-06-10 DIAGNOSIS — R0609 Other forms of dyspnea: Secondary | ICD-10-CM

## 2011-06-10 NOTE — Progress Notes (Signed)
  Subjective:    Patient ID: Tim [name], male    DOB: 01/27/1929, 77 y.o.   MRN: [id number]  HPI The patient comes in today for followup of his mild emphysema, and also multifactorial dyspnea on exertion.  He is trying to stay as active as possible, but has noted a mild increase in his shortness of breath from his last visit.  He is not taking his medications as directed on a regular basis, primarily due to financial issues.  He has at least not had an acute exacerbation or pulmonary infection since the last visit.   Review of Systems  Constitutional: Negative for fever and unexpected weight change.  HENT: Negative for ear pain, nosebleeds, congestion, sore throat, rhinorrhea, sneezing, trouble swallowing, dental problem, postnasal drip and sinus pressure.   Eyes: Negative for redness and itching.  Respiratory: Positive for shortness of breath. Negative for cough, chest tightness and wheezing.   Cardiovascular: Positive for leg swelling. Negative for palpitations.  Gastrointestinal: Negative for nausea and vomiting.  Genitourinary: Negative for dysuria.  Musculoskeletal: Positive for joint swelling.  Skin: Negative for rash.  Neurological: Negative for headaches.  Hematological: Bruises/bleeds easily.  Psychiatric/Behavioral: Negative for dysphoric mood. The patient is not nervous/anxious.        Objective:   Physical Exam Well-developed male in no acute distress Nose without purulence or discharge noted Chest with mild decrease in breath sounds, no wheezes or rhonchi Cardiac exam was regular rate and rhythm Lower extremities with no significant edema, no cyanosis noted Alert and oriented, moves all 4 extremities.       Assessment & Plan:

## 2011-06-10 NOTE — Patient Instructions (Signed)
Will give you a flu shot today Try staying on advair one inhalation in am and pm, and hold on spiriva to see how you do.  If you need both though, then take it. Stay as active as you can. followup with me in 6mos.

## 2011-06-10 NOTE — Assessment & Plan Note (Signed)
The patient is doing well from a COPD standpoint overall, and has not had a recent acute exacerbation or pulmonary infection.  It is been difficult for him to afford both Advair and Spiriva, and he is been taking both intermittently at varying doses.  I have asked him to try staying on Advair alone twice a day, and see if he can do without the Spiriva.  I have also asked him to stay as active as possible.

## 2011-06-10 NOTE — Assessment & Plan Note (Signed)
The patient has multifactorial dyspnea, and is trying to stay as active as possible.  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

## 2011-06-18 ENCOUNTER — Other Ambulatory Visit: Payer: Self-pay | Admitting: Cardiology

## 2011-06-18 MED ORDER — ATENOLOL 50 MG PO TABS: 50.0000 mg | ORAL_TABLET | Freq: Every day | ORAL | Status: DC

## 2011-09-04 ENCOUNTER — Telehealth: Payer: Self-pay | Admitting: Pulmonary Disease

## 2011-09-04 NOTE — Telephone Encounter (Signed)
Have him get some tylenol cold and sinus and take according to directions on box Would also get mucinex to see if it helps unstop his head. If he begins to cough up nasty mucus, or breathing gets worse, he is to let us know.

## 2011-09-04 NOTE — Telephone Encounter (Signed)
I spoke with pt and is aware of KC recs. He voiced his understanding. Pt wrote down the medications I advised him to taker per [facility] note. Nothing further was needed

## 2011-09-04 NOTE — Telephone Encounter (Signed)
Call patient's cell--[phone]

## 2011-09-04 NOTE — Telephone Encounter (Signed)
Sinus drainage, sob a little worse not to bad, stuffy nose, cough-prod-clear, pt denies fever, chills and sweats--pls advise of recs.

## 2011-10-16 ENCOUNTER — Encounter: Payer: Self-pay | Admitting: Cardiology

## 2011-10-16 DIAGNOSIS — R9430 Abnormal result of cardiovascular function study, unspecified: Secondary | ICD-10-CM | POA: Insufficient documentation

## 2011-10-16 DIAGNOSIS — R5383 Other fatigue: Secondary | ICD-10-CM | POA: Insufficient documentation

## 2011-10-16 DIAGNOSIS — I2510 Atherosclerotic heart disease of native coronary artery without angina pectoris: Secondary | ICD-10-CM | POA: Insufficient documentation

## 2011-10-16 DIAGNOSIS — J986 Disorders of diaphragm: Secondary | ICD-10-CM | POA: Insufficient documentation

## 2011-10-16 DIAGNOSIS — K219 Gastro-esophageal reflux disease without esophagitis: Secondary | ICD-10-CM | POA: Insufficient documentation

## 2011-10-16 DIAGNOSIS — I779 Disorder of arteries and arterioles, unspecified: Secondary | ICD-10-CM | POA: Insufficient documentation

## 2011-10-16 DIAGNOSIS — N400 Enlarged prostate without lower urinary tract symptoms: Secondary | ICD-10-CM | POA: Insufficient documentation

## 2011-10-16 DIAGNOSIS — R748 Abnormal levels of other serum enzymes: Secondary | ICD-10-CM | POA: Insufficient documentation

## 2011-10-16 DIAGNOSIS — Z789 Other specified health status: Secondary | ICD-10-CM | POA: Insufficient documentation

## 2011-10-16 DIAGNOSIS — I739 Peripheral vascular disease, unspecified: Secondary | ICD-10-CM

## 2011-10-16 DIAGNOSIS — M5090 Cervical disc disorder, unspecified, unspecified cervical region: Secondary | ICD-10-CM | POA: Insufficient documentation

## 2011-10-19 ENCOUNTER — Encounter: Payer: Self-pay | Admitting: Cardiology

## 2011-10-19 ENCOUNTER — Ambulatory Visit (INDEPENDENT_AMBULATORY_CARE_PROVIDER_SITE_OTHER): Payer: Medicare Other | Admitting: Cardiology

## 2011-10-19 VITALS — BP 162/84 | HR 75 | Ht 74.0 in | Wt 211.0 lb

## 2011-10-19 DIAGNOSIS — I2510 Atherosclerotic heart disease of native coronary artery without angina pectoris: Secondary | ICD-10-CM

## 2011-10-19 DIAGNOSIS — I779 Disorder of arteries and arterioles, unspecified: Secondary | ICD-10-CM

## 2011-10-19 DIAGNOSIS — M545 Low back pain, unspecified: Secondary | ICD-10-CM

## 2011-10-19 DIAGNOSIS — E8779 Other fluid overload: Secondary | ICD-10-CM

## 2011-10-19 DIAGNOSIS — R0609 Other forms of dyspnea: Secondary | ICD-10-CM

## 2011-10-19 DIAGNOSIS — R0989 Other specified symptoms and signs involving the circulatory and respiratory systems: Secondary | ICD-10-CM

## 2011-10-19 DIAGNOSIS — I10 Essential (primary) hypertension: Secondary | ICD-10-CM

## 2011-10-19 NOTE — Assessment & Plan Note (Signed)
The patient had surgery for cervical disc disease in the past. He has significant low back discomfort at this time. The neurosurgeon who had done his surgery in the past has moved away from [place]. I will make another recommendation to the patient.

## 2011-10-19 NOTE — Assessment & Plan Note (Signed)
This is a chronic problem. He is followed by pulmonary. He has some COPD and a paralyzed right hemidiaphragm. This problem is stable. No further workup.

## 2011-10-19 NOTE — Progress Notes (Signed)
HPI Patient is seen to followup coronary artery disease. He's actually doing well. He's not having any chest pain. He underwent stenting of his right coronary artery in 2001. Nuclear scan in February, 2010 revealed no ischemia. He's had some shortness of breath over time. He has some COPD and he has a paralyzed hemidiaphragm. He does remain active. He's had chronic CPK elevation and we've not been able to use a statin. I saw him last September, 2011.  As part of today's evaluation I have reviewed all of the patient's old cardiac history and I have completely updated new electronic medical record. No Known Allergies  Current Outpatient Prescriptions  Medication Sig Dispense Refill  . acetaminophen (TYLENOL) 500 MG tablet Take 500 mg by mouth as needed.        [name] ADVAIR DISKUS 250-50 MCG/DOSE AEPB Inhale 1 puff into the lungs BID times 48H.      [name] albuterol (VENTOLIN HFA) 108 (90 BASE) MCG/ACT inhaler Inhale 2 puffs into the lungs every 6 (six) hours as needed.        [name] aspirin 325 MG tablet Take 325 mg by mouth daily.        [name] atenolol (TENORMIN) 50 MG tablet Take 1 tablet (50 mg total) by mouth daily.  90 tablet  1  . finasteride (PROSCAR) 5 MG tablet Take 1 tablet by mouth daily.      . furosemide (LASIX) 40 MG tablet Take 40 mg by mouth daily as needed.        . Naproxen Sodium (ALEVE) 220 MG CAPS Take by mouth as needed.        [name] omeprazole (PRILOSEC) 20 MG capsule Take 20 mg by mouth daily.        [name] oxymetazoline (AFRIN) 0.05 % nasal spray Place into the nose as needed.        [name] SPIRIVA HANDIHALER 18 MCG inhalation capsule Place 1 capsule into inhaler and inhale daily.        History   Social History  . Marital Status: Married    Spouse Name: N/A    Number of Children: N/A  . Years of Education: N/A   Occupational History  . retired Music therapist    Social History Main Topics  . Smoking status: Former Smoker -- 3.0 packs/day for 40 years    Types: Cigarettes    Quit date: 09/15/1975   . Smokeless tobacco: Not on file  . Alcohol Use: Not on file  . Drug Use: Not on file  . Sexually Active: Not on file   Other Topics Concern  . Not on file   Social History Narrative  . No narrative on file    No family history on file.  Past Medical History  Diagnosis Date  . Hypertension   . Hyperlipidemia   . CAD (coronary artery disease)     9 STEMI 2001, PCI, RCA, residual 100% circumflex  /   PCI for in-stent restenosis June, 2001  /  nuclear February, 2010, no ischemia  . Emphysema     Mild  . GERD (gastroesophageal reflux disease)   . Cervical disc disease     Status post neurosurgery  . BPH (benign prostatic hypertrophy)   . Paralyzed hemidiaphragm     Right  . Statin intolerance     Elevated CPK in the past  . Paralyzed hemidiaphragm     Chronic  . Carotid artery disease     Doppler, June, 2011, 0-39% bilateral  . Colon  polyps   . Elevated CPK     Chronic mild CPK elevation  . Fatigue     Morning fatigue, August, 2011  . Ejection fraction     EF 55-65%, echo, 2009    Past Surgical History  Procedure Date  . Cervical disc surgery   . Breast surgery     right  . Skin cancer excision     ROS  Patient denies fever, chills, headache, sweats, rash, change in vision, change in hearing, chest pain, cough, nausea vomiting, urinary symptoms. He is having some low back pain. All other systems are reviewed and are negative.  PHYSICAL EXAM Patient is oriented to person time and place. Affect is normal. He is mildly barrel chested area there is no jugular venous distention. Lungs reveal a few scattered rhonchi. There is no respiratory distress. Cardiac exam reveals S1 and S2. There no clicks or significant murmurs. The abdomen is soft. He has no significant peripheral edema. There no musculoskeletal deformities. There are no skin rashes. Filed Vitals:   10/19/11 1327  BP: 162/84  Pulse: 75  Height: 6\' 2"  (1.88 m)  Weight: 211 lb (95.709 kg)    EKG EKG is  done today and reviewed by me. There sinus rhythm. There is no significant change. ASSESSMENT & PLAN

## 2011-10-19 NOTE — Patient Instructions (Addendum)
Your physician wants you to follow-up in: 12 month.   You will receive a reminder letter in the mail two months in advance. If you don't receive a letter, please call our office to schedule the follow-up appointment.  Your physician has requested that you have a carotid duplex. This test is an ultrasound of the carotid arteries in your neck. It looks at blood flow through these arteries that supply the brain with blood. Allow one hour for this exam. There are no restrictions or special instructions.  You have been referred to Dr Danielle Dess [address], [address]  [place], [place] [address] Phone: [phone]

## 2011-10-19 NOTE — Assessment & Plan Note (Signed)
Patient's coronary disease is stable. His cardiac event was in 2001. In February, 2010 he had a nuclear scan with no ischemia. He's not having any marked symptoms. Unfortunately he is completely statin intolerant. No further workup is needed.

## 2011-10-19 NOTE — Assessment & Plan Note (Signed)
Systolic blood pressures mildly elevated today. However at his other doctor appointments recently his pressures been normal. I've chosen not to change his medicine.

## 2011-10-19 NOTE — Assessment & Plan Note (Signed)
He has minimal carotid artery disease in the past. He will need a followup Doppler at some point.

## 2011-10-19 NOTE — Assessment & Plan Note (Signed)
His fluid status is stable.  No change in therapy. 

## 2011-11-30 ENCOUNTER — Other Ambulatory Visit: Payer: Self-pay | Admitting: Cardiology

## 2011-12-09 ENCOUNTER — Ambulatory Visit: Payer: Medicare Other | Admitting: Pulmonary Disease

## 2011-12-10 ENCOUNTER — Encounter: Payer: Self-pay | Admitting: Pulmonary Disease

## 2011-12-10 ENCOUNTER — Ambulatory Visit (INDEPENDENT_AMBULATORY_CARE_PROVIDER_SITE_OTHER): Payer: Medicare Other | Admitting: Pulmonary Disease

## 2011-12-10 ENCOUNTER — Encounter (INDEPENDENT_AMBULATORY_CARE_PROVIDER_SITE_OTHER): Payer: Medicare Other

## 2011-12-10 VITALS — BP 172/90 | HR 77 | Temp 97.5°F | Ht 74.0 in | Wt 210.2 lb

## 2011-12-10 DIAGNOSIS — J438 Other emphysema: Secondary | ICD-10-CM

## 2011-12-10 DIAGNOSIS — I779 Disorder of arteries and arterioles, unspecified: Secondary | ICD-10-CM

## 2011-12-10 DIAGNOSIS — I6529 Occlusion and stenosis of unspecified carotid artery: Secondary | ICD-10-CM

## 2011-12-10 MED ORDER — FLUTICASONE PROPIONATE 50 MCG/ACT NA SUSP: 2.0000 | Freq: Every day | NASAL | Status: DC

## 2011-12-10 NOTE — Progress Notes (Signed)
  Subjective:    Patient ID: Tim [name], male    DOB: 12/01/1928, 78 y.o.   MRN: [id number]  HPI The patient comes in today for followup of his known COPD.  He also has a paralyzed right hemidiaphragm.  The patient feels that his exertional tolerance is at his usual baseline, but he did have a recent chest cold that has since resolved.  He is trying to stay as active as possible, and denies any chest congestion at this time.  He is having a lot of postnasal drip at night, and this leads to congestion in his throat area up on awakening.   Review of Systems  Constitutional: Negative for fever and unexpected weight change.  HENT: Positive for congestion. Negative for ear pain, nosebleeds, sore throat, rhinorrhea, sneezing, trouble swallowing, dental problem, postnasal drip and sinus pressure.   Eyes: Negative for redness and itching.  Respiratory: Positive for cough, chest tightness, shortness of breath and wheezing.   Cardiovascular: Negative for palpitations and leg swelling.  Gastrointestinal: Negative for nausea and vomiting.  Genitourinary: Negative for dysuria.  Musculoskeletal: Negative for joint swelling.  Skin: Negative for rash.  Neurological: Negative for headaches.  Hematological: Bruises/bleeds easily.  Psychiatric/Behavioral: Negative for dysphoric mood. The patient is not nervous/anxious.        Objective:   Physical Exam Well-developed male in no acute distress Nose without purulence or discharge noted Chest completely clear, no wheezing Cardiac exam is regular rate and rhythm Lower extremities with no significant edema, no cyanosis Alert and oriented, moves all 4 extremities.       Assessment & Plan:

## 2011-12-10 NOTE — Patient Instructions (Signed)
Continue current medications Stay as active as possible. Will try flonase nasal spray, 2 each nostril each night Try chlorpheniramine 8mg  over the counter at bedtime for your nasal drip.  If it messes up your prostate/urinary stream, try zyrtec 10mg  at bedtime instead.  If these are not helpful, can stick with mucinex at bedtime.  followup with me in 6mos.

## 2011-12-10 NOTE — Assessment & Plan Note (Signed)
The patient is doing fairly well overall from a COPD standpoint.  He is staying on the Advair consistently, and is using the Spiriva when needed because of financial issues.  I've asked him to continue on his current bronchodilator regimen, and also work on improving his conditioning.  He is having some rhinitis with significant postnasal drip, and we will see if we can help his symptoms.

## 2011-12-21 ENCOUNTER — Telehealth: Payer: Self-pay | Admitting: Cardiology

## 2011-12-22 NOTE — Telephone Encounter (Signed)
Error

## 2012-02-09 ENCOUNTER — Other Ambulatory Visit: Payer: Self-pay | Admitting: Pulmonary Disease

## 2012-03-16 ENCOUNTER — Other Ambulatory Visit: Payer: Self-pay | Admitting: Pulmonary Disease

## 2012-04-04 ENCOUNTER — Other Ambulatory Visit: Payer: Self-pay | Admitting: Pulmonary Disease

## 2012-06-17 ENCOUNTER — Ambulatory Visit: Payer: Medicare Other | Admitting: Pulmonary Disease

## 2012-07-08 ENCOUNTER — Encounter: Payer: Self-pay | Admitting: Pulmonary Disease

## 2012-07-08 ENCOUNTER — Ambulatory Visit (INDEPENDENT_AMBULATORY_CARE_PROVIDER_SITE_OTHER): Payer: Medicare Other | Admitting: Pulmonary Disease

## 2012-07-08 VITALS — BP 134/86 | HR 62 | Temp 97.5°F | Ht 74.0 in | Wt 208.4 lb

## 2012-07-08 DIAGNOSIS — J449 Chronic obstructive pulmonary disease, unspecified: Secondary | ICD-10-CM

## 2012-07-08 DIAGNOSIS — Z23 Encounter for immunization: Secondary | ICD-10-CM

## 2012-07-08 DIAGNOSIS — J986 Disorders of diaphragm: Secondary | ICD-10-CM

## 2012-07-08 MED ORDER — FLUTICASONE-SALMETEROL 250-50 MCG/DOSE IN AEPB: INHALATION_SPRAY | RESPIRATORY_TRACT | Status: DC

## 2012-07-08 MED ORDER — TIOTROPIUM BROMIDE MONOHYDRATE 18 MCG IN CAPS: 18.0000 ug | ORAL_CAPSULE | Freq: Every day | RESPIRATORY_TRACT | Status: DC

## 2012-07-08 NOTE — Progress Notes (Signed)
  Subjective:    Patient ID: Tim [name], male    DOB: [birth year], 78 y.o.   MRN: [id number]  HPI The patient comes in today for followup of his known moderate COPD, as well has paralyzed right hemidiaphragm.  He is doing fairly well from a pulmonary standpoint, he is maintaining his baseline exertional tolerance.  He has not had an acute exacerbation since last visit, nor has he required his rescue inhaler.  His only complaint today is that of postnasal drip with associated upper airway pseudo wheezing.   Review of Systems  Constitutional: Negative for fever and unexpected weight change.  HENT: Positive for congestion and postnasal drip. Negative for ear pain, nosebleeds, sore throat, rhinorrhea, sneezing, trouble swallowing, dental problem and sinus pressure.   Eyes: Negative for redness and itching.  Respiratory: Positive for cough, chest tightness, shortness of breath and wheezing.   Cardiovascular: Positive for leg swelling. Negative for palpitations.  Gastrointestinal: Negative for nausea and vomiting.  Genitourinary: Negative for dysuria.  Musculoskeletal: Negative for joint swelling.  Skin: Negative for rash.  Neurological: Negative for headaches.  Hematological: Does not bruise/bleed easily.  Psychiatric/Behavioral: Negative for dysphoric mood. The patient is not nervous/anxious.        Objective:   Physical Exam Well developed male in nad Nose without purulence or discharge noted. Chest with mild decrease in bs, no wheezing Cor with rrr LE without edema, no cyanosis Alert and oriented, moves all 4.        Assessment & Plan:

## 2012-07-08 NOTE — Assessment & Plan Note (Signed)
The patient is stable from a pulmonary standpoint, and is continuing on his current bronchodilator regimen.  I have asked him to stay on his medication, and will also give him the flu shot today.  I have also asked him to try chlorpheniramine for his postnasal drip.

## 2012-07-08 NOTE — Patient Instructions (Addendum)
No change in breathing medications Can try chlorpheniramine 4mg  over the counter.  Take 1-2 at bedtime for nasal drip.  If you cannot find, ask someone to help you. followup with me in 6 mos.

## 2012-08-03 ENCOUNTER — Other Ambulatory Visit: Payer: Self-pay | Admitting: *Deleted

## 2012-08-03 MED ORDER — ATENOLOL 50 MG PO TABS: 50.0000 mg | ORAL_TABLET | Freq: Every day | ORAL | Status: DC

## 2012-08-15 ENCOUNTER — Other Ambulatory Visit: Payer: Self-pay

## 2012-08-15 ENCOUNTER — Other Ambulatory Visit: Payer: Self-pay | Admitting: Cardiology

## 2012-08-15 MED ORDER — ATENOLOL 50 MG PO TABS: 50.0000 mg | ORAL_TABLET | Freq: Every day | ORAL | Status: DC

## 2012-08-15 NOTE — Telephone Encounter (Signed)
Refill f/u -  atenolol (TENORMIN) 50 MG tablet   RX was sent to wrong pharmacy, [facility]  Pt is dealing with a customer service rep at [facility].  Plz call this RX in as pt is almost out of medication.

## 2012-10-20 ENCOUNTER — Encounter: Payer: Self-pay | Admitting: Cardiology

## 2012-10-24 ENCOUNTER — Encounter: Payer: Self-pay | Admitting: Cardiology

## 2012-10-24 ENCOUNTER — Ambulatory Visit (INDEPENDENT_AMBULATORY_CARE_PROVIDER_SITE_OTHER): Payer: Medicare Other | Admitting: Cardiology

## 2012-10-24 VITALS — BP 154/78 | HR 68 | Ht 74.0 in | Wt 211.0 lb

## 2012-10-24 DIAGNOSIS — J986 Disorders of diaphragm: Secondary | ICD-10-CM

## 2012-10-24 DIAGNOSIS — E785 Hyperlipidemia, unspecified: Secondary | ICD-10-CM

## 2012-10-24 DIAGNOSIS — E8779 Other fluid overload: Secondary | ICD-10-CM

## 2012-10-24 DIAGNOSIS — I10 Essential (primary) hypertension: Secondary | ICD-10-CM

## 2012-10-24 DIAGNOSIS — E8770 Fluid overload, unspecified: Secondary | ICD-10-CM

## 2012-10-24 DIAGNOSIS — R748 Abnormal levels of other serum enzymes: Secondary | ICD-10-CM

## 2012-10-24 DIAGNOSIS — J449 Chronic obstructive pulmonary disease, unspecified: Secondary | ICD-10-CM

## 2012-10-24 DIAGNOSIS — I779 Disorder of arteries and arterioles, unspecified: Secondary | ICD-10-CM

## 2012-10-24 DIAGNOSIS — I2510 Atherosclerotic heart disease of native coronary artery without angina pectoris: Secondary | ICD-10-CM

## 2012-10-24 MED ORDER — FUROSEMIDE 20 MG PO TABS: 20.0000 mg | ORAL_TABLET | Freq: Every day | ORAL | Status: DC | PRN

## 2012-10-24 MED ORDER — OMEPRAZOLE 20 MG PO CPDR: 20.0000 mg | DELAYED_RELEASE_CAPSULE | Freq: Every day | ORAL | Status: DC

## 2012-10-24 MED ORDER — ASPIRIN 81 MG PO TABS: 81.0000 mg | ORAL_TABLET | Freq: Every day | ORAL | Status: DC

## 2012-10-24 MED ORDER — ATENOLOL 50 MG PO TABS: 50.0000 mg | ORAL_TABLET | Freq: Every day | ORAL | Status: DC

## 2012-10-24 NOTE — Assessment & Plan Note (Signed)
The patient has significant COPD. This is followed by the pulmonary team. No change in therapy.

## 2012-10-24 NOTE — Assessment & Plan Note (Signed)
The patient has had some fluid overload in the past. He is getting symptoms now of some fluid overload again. It was time to followup with a two-dimensional echo. In the meantime I will have him start to take 20 mg of Lasix every day. I will then see him back for followup. Also chemistry lab and TSH will be checked along with CBC.

## 2012-10-24 NOTE — Progress Notes (Signed)
HPI  Patient is seen today to followup coronary disease and carotid artery disease and volume overload and COPD. I saw him last February, 2013. After his visit last year he had a followup carotid Doppler. This showed some progression of his disease. It was felt that it would be safe to have her followup in a year. The patient does have lung disease. He saw Dr. Shelle Iron for pulmonary followup October, 2013. He was stable.  Overall he is doing well. However he does have some increased shortness of breath at different times. He feels that it is fluid and at those times he takes some Lasix. He does respond to this. He had had some mild volume overload by history the past.  He's had normal left ventricular function in the past. However his last echo was 2009.   No Known Allergies  Current Outpatient Prescriptions  Medication Sig Dispense Refill  . acetaminophen (TYLENOL) 500 MG tablet Take 500 mg by mouth as needed.        [name] aspirin 325 MG tablet Take 325 mg by mouth daily.        [name] atenolol (TENORMIN) 50 MG tablet Take 1 tablet (50 mg total) by mouth daily.  90 tablet  0  . finasteride (PROSCAR) 5 MG tablet Take 1 tablet by mouth daily.      . fluticasone (FLONASE) 50 MCG/ACT nasal spray Place 2 sprays into the nose daily as needed.      . Fluticasone-Salmeterol (ADVAIR DISKUS) 250-50 MCG/DOSE AEPB INHALE 1 DOSE BY MOUTH TWICE DAILY. RINSE MOUTH AFTER USE  60 each  6  . furosemide (LASIX) 40 MG tablet Take 40 mg by mouth daily as needed.        . Naproxen Sodium (ALEVE) 220 MG CAPS Take by mouth as needed.        [name] omeprazole (PRILOSEC) 20 MG capsule Take 20 mg by mouth daily.        [name] oxymetazoline (AFRIN) 0.05 % nasal spray Place into the nose as needed.        . tiotropium (SPIRIVA HANDIHALER) 18 MCG inhalation capsule Place 1 capsule (18 mcg total) into inhaler and inhale daily.  30 capsule  6  . VENTOLIN HFA 108 (90 BASE) MCG/ACT inhaler INHALE 1-2 PUFFS BY MOUTH EVERY 4-6 HOURS AS NEEDED   18 g  2   No current facility-administered medications for this visit.    History   Social History  . Marital Status: Married    Spouse Name: N/A    Number of Children: N/A  . Years of Education: N/A   Occupational History  . retired Music therapist    Social History Main Topics  . Smoking status: Former Smoker -- 3.00 packs/day for 40 years    Types: Cigarettes    Quit date: 09/15/1975  . Smokeless tobacco: Not on file  . Alcohol Use: Not on file  . Drug Use: Not on file  . Sexually Active: Not on file   Other Topics Concern  . Not on file   Social History Narrative  . No narrative on file    No family history on file.  Past Medical History  Diagnosis Date  . Hypertension   . Hyperlipidemia   . CAD (coronary artery disease)     9 STEMI 2001, PCI, RCA, residual 100% circumflex  /   PCI for in-stent restenosis June, 2001  /  nuclear February, 2010, no ischemia  . Emphysema     Mild  .  GERD (gastroesophageal reflux disease)   . Cervical disc disease     Status post neurosurgery  . BPH (benign prostatic hypertrophy)   . Paralyzed hemidiaphragm     Right  . Statin intolerance     Elevated CPK in the past  . Paralyzed hemidiaphragm     Chronic  . Carotid artery disease     Doppler, June, 2011, 0-39% bilateral  . Colon polyps   . Elevated CPK     Chronic mild CPK elevation  . Fatigue     Morning fatigue, August, 2011  . Ejection fraction     EF 55-65%, echo, 2009  . Low back pain     February, 2013    Past Surgical History  Procedure Laterality Date  . Cervical disc surgery    . Breast surgery      right  . Skin cancer excision      Patient Active Problem List  Diagnosis  . HYPERLIPIDEMIA-MIXED  . FLUID OVERLOAD  . HYPERTENSION, BENIGN  . COPD (chronic obstructive pulmonary disease)  . DYSPNEA ON EXERTION  . LEG CRAMPS  . CAD (coronary artery disease)  . GERD (gastroesophageal reflux disease)  . Cervical disc disease  . BPH (benign prostatic  hypertrophy)  . Paralyzed hemidiaphragm  . Statin intolerance  . Carotid artery disease  . Elevated CPK  . Fatigue  . Ejection fraction  . Low back pain    ROS   Patient denies fever, chills, headache, sweats, rash, change in vision, change in hearing, chest pain, nausea vomiting, urinary symptoms.He does have sensation of some excess phlegm that he has to cough up at times. All other systems are reviewed and are negative.  PHYSICAL EXAM  Patient looks good today. He is oriented to person time and place. Affect is normal. Lungs reveal scattered rhonchi. There is no jugulovenous distention. Cardiac exam reveals S1 and S2. There no clicks or significant murmurs. The abdomen is soft. Is no peripheral edema. There no musculoskeletal deformities. There are no skin rashes.  Filed Vitals:   10/24/12 1035  BP: 154/78  Pulse: 68  Height: 6\' 2"  (1.88 m)  Weight: 211 lb (95.709 kg)     ASSESSMENT & PLAN

## 2012-10-24 NOTE — Patient Instructions (Addendum)
Your physician has recommended you make the following change in your medication: DECREASE your aspirin to 81 mg daily.  Continue to take Lasix (furosemide) 20 mg daily  Your physician recommends that you return for lab work in: today (cbc, bmet, tsh)  Your physician recommends that you schedule a follow-up appointment in: April  Your physician has requested that you have an echocardiogram. Echocardiography is a painless test that uses sound waves to create images of your heart. It provides your doctor with information about the size and shape of your heart and how well your heart's chambers and valves are working. This procedure takes approximately one hour. There are no restrictions for this procedure.  Your physician has requested that you have a carotid duplex in April. This test is an ultrasound of the carotid arteries in your neck. It looks at blood flow through these arteries that supply the brain with blood. Allow one hour for this exam. There are no restrictions or special instructions.

## 2012-10-24 NOTE — Assessment & Plan Note (Signed)
The patient has a history of statin intolerance with elevated CPK. This is why he is not on a statin.

## 2012-10-24 NOTE — Assessment & Plan Note (Signed)
The patient has had mild chronic CPK elevation. He therefore does not receive a statin. This has been stable.  The patient's aspirin will be reduced to 81 mg. He will start taking Lasix 20 mg daily. TSH CBC and be met will be checked. I will arrange for 2-D echo and in the near future he needs a followup carotid Doppler. I will see him back to followup all of these issues.  As part of today's evaluation I spent greater than 25 minutes with overall care with the patient. More than half of this was reviewing all of his status with him and discussing issues and examining him.

## 2012-10-24 NOTE — Assessment & Plan Note (Signed)
Systolic blood pressure is mildly elevated today. We will follow this as he has mild diuresis.

## 2012-10-24 NOTE — Assessment & Plan Note (Signed)
Coronary disease is stable. I do not think his symptoms are ischemic at this time. He had a nuclear scan in February, 2010. No further workup at this time.

## 2012-10-24 NOTE — Assessment & Plan Note (Signed)
The patient has a paralyzed right hemidiaphragm. This plays a role with his pulmonary illness.

## 2012-10-24 NOTE — Assessment & Plan Note (Signed)
The patient has known carotid disease. He had a Doppler in the arch, 2013. This showed progression of his disease with plans for followup in one year. We will make sure that this is scheduled in March.

## 2012-10-31 ENCOUNTER — Ambulatory Visit (HOSPITAL_COMMUNITY): Payer: Medicare Other | Attending: Cardiology | Admitting: Radiology

## 2012-10-31 DIAGNOSIS — R748 Abnormal levels of other serum enzymes: Secondary | ICD-10-CM

## 2012-10-31 DIAGNOSIS — I2510 Atherosclerotic heart disease of native coronary artery without angina pectoris: Secondary | ICD-10-CM | POA: Insufficient documentation

## 2012-10-31 DIAGNOSIS — I10 Essential (primary) hypertension: Secondary | ICD-10-CM

## 2012-10-31 DIAGNOSIS — J4489 Other specified chronic obstructive pulmonary disease: Secondary | ICD-10-CM | POA: Insufficient documentation

## 2012-10-31 DIAGNOSIS — J449 Chronic obstructive pulmonary disease, unspecified: Secondary | ICD-10-CM

## 2012-10-31 DIAGNOSIS — E785 Hyperlipidemia, unspecified: Secondary | ICD-10-CM | POA: Insufficient documentation

## 2012-10-31 LAB — CBC WITH DIFFERENTIAL/PLATELET
Basophils Absolute: 0 10*3/uL (ref 0.0–0.1)
Basophils Relative: 0.6 % (ref 0.0–3.0)
Eosinophils Absolute: 0.1 10*3/uL (ref 0.0–0.7)
Eosinophils Relative: 1.5 % (ref 0.0–5.0)
HCT: 40.8 % (ref 39.0–52.0)
Hemoglobin: 13.6 g/dL (ref 13.0–17.0)
Lymphocytes Relative: 29.8 % (ref 12.0–46.0)
Lymphs Abs: 2.3 10*3/uL (ref 0.7–4.0)
MCHC: 33.4 g/dL (ref 30.0–36.0)
MCV: 86.9 fl (ref 78.0–100.0)
Monocytes Absolute: 0.7 10*3/uL (ref 0.1–1.0)
Monocytes Relative: 9.6 % (ref 3.0–12.0)
Neutro Abs: 4.5 10*3/uL (ref 1.4–7.7)
Neutrophils Relative %: 58.5 % (ref 43.0–77.0)
Platelets: 230 10*3/uL (ref 150.0–400.0)
RBC: 4.69 Mil/uL (ref 4.22–5.81)
RDW: 14.4 % (ref 11.5–14.6)
WBC: 7.7 10*3/uL (ref 4.5–10.5)

## 2012-10-31 LAB — BASIC METABOLIC PANEL
BUN: 20 mg/dL (ref 6–23)
CO2: 27 mEq/L (ref 19–32)
Calcium: 8.8 mg/dL (ref 8.4–10.5)
Chloride: 107 mEq/L (ref 96–112)
Creatinine, Ser: 1.1 mg/dL (ref 0.4–1.5)
GFR: 68.12 mL/min (ref >60.00)
Glucose, Bld: 95 mg/dL (ref 70–99)
Potassium: 4.9 mEq/L (ref 3.5–5.1)
Sodium: 139 mEq/L (ref 135–145)

## 2012-10-31 LAB — TSH: TSH: 1.1 u[IU]/mL (ref 0.35–5.50)

## 2012-10-31 NOTE — Progress Notes (Signed)
Echocardiogram performed.  

## 2012-12-10 ENCOUNTER — Encounter: Payer: Self-pay | Admitting: Cardiology

## 2012-12-12 ENCOUNTER — Ambulatory Visit: Payer: Medicare Other | Admitting: Cardiology

## 2012-12-12 ENCOUNTER — Encounter: Payer: Self-pay | Admitting: Cardiology

## 2012-12-12 ENCOUNTER — Encounter (INDEPENDENT_AMBULATORY_CARE_PROVIDER_SITE_OTHER): Payer: Medicare Other

## 2012-12-12 ENCOUNTER — Ambulatory Visit (INDEPENDENT_AMBULATORY_CARE_PROVIDER_SITE_OTHER): Payer: Medicare Other | Admitting: Cardiology

## 2012-12-12 VITALS — BP 160/78 | HR 84 | Resp 24 | Ht 74.0 in | Wt 210.0 lb

## 2012-12-12 DIAGNOSIS — I779 Disorder of arteries and arterioles, unspecified: Secondary | ICD-10-CM

## 2012-12-12 DIAGNOSIS — J449 Chronic obstructive pulmonary disease, unspecified: Secondary | ICD-10-CM

## 2012-12-12 DIAGNOSIS — E8770 Fluid overload, unspecified: Secondary | ICD-10-CM

## 2012-12-12 DIAGNOSIS — E8779 Other fluid overload: Secondary | ICD-10-CM

## 2012-12-12 DIAGNOSIS — E785 Hyperlipidemia, unspecified: Secondary | ICD-10-CM

## 2012-12-12 DIAGNOSIS — I2510 Atherosclerotic heart disease of native coronary artery without angina pectoris: Secondary | ICD-10-CM

## 2012-12-12 DIAGNOSIS — I6529 Occlusion and stenosis of unspecified carotid artery: Secondary | ICD-10-CM

## 2012-12-12 NOTE — Assessment & Plan Note (Signed)
The patient cannot be treated with a statin.

## 2012-12-12 NOTE — Assessment & Plan Note (Signed)
Coronary disease is stable. No change in therapy. 

## 2012-12-12 NOTE — Assessment & Plan Note (Signed)
The patient will be following up with the pulmonary team. He does have a paralyzed right hemidiaphragm and other pulmonary disease.

## 2012-12-12 NOTE — Progress Notes (Signed)
HPI  The patient is seen to followup shortness of breath. I saw him last October 24, 2012. I was concerned that some of his shortness of breath could be related to left ventricular function. Plans were made for an echocardiogram. This was done August 02, 2013 in addition an ejection fraction of 55-60%.  We also may plans at that time for carotid Doppler to be followed up. This was actually done today and his carotids are stable with 40-59% bilateral stenoses.  The patient says he still has some wheezing. I suspect that this is his pulmonary disease. He will be following up with the pulmonary team soon.  No Known Allergies  Current Outpatient Prescriptions  Medication Sig Dispense Refill  . acetaminophen (TYLENOL) 500 MG tablet Take 500 mg by mouth as needed.        [name] aspirin 81 MG tablet Take 1 tablet (81 mg total) by mouth daily.  30 tablet  6  . atenolol (TENORMIN) 50 MG tablet Take 1 tablet (50 mg total) by mouth daily.  90 tablet  0  . finasteride (PROSCAR) 5 MG tablet Take 1 tablet by mouth daily.      . Fluticasone-Salmeterol (ADVAIR DISKUS) 250-50 MCG/DOSE AEPB INHALE 1 DOSE BY MOUTH TWICE DAILY. RINSE MOUTH AFTER USE  60 each  6  . furosemide (LASIX) 20 MG tablet Take 1 tablet (20 mg total) by mouth daily as needed.  90 tablet  2  . Naproxen Sodium (ALEVE) 220 MG CAPS Take by mouth as needed.        [name] omeprazole (PRILOSEC) 20 MG capsule Take 1 capsule (20 mg total) by mouth daily.  90 capsule  2  . oxymetazoline (AFRIN) 0.05 % nasal spray Place into the nose as needed.        . tiotropium (SPIRIVA HANDIHALER) 18 MCG inhalation capsule Place 1 capsule (18 mcg total) into inhaler and inhale daily.  30 capsule  6  . VENTOLIN HFA 108 (90 BASE) MCG/ACT inhaler INHALE 1-2 PUFFS BY MOUTH EVERY 4-6 HOURS AS NEEDED  18 g  2  . fluticasone (FLONASE) 50 MCG/ACT nasal spray Place 2 sprays into the nose daily as needed.       No current facility-administered medications for this visit.     History   Social History  . Marital Status: Married    Spouse Name: N/A    Number of Children: N/A  . Years of Education: N/A   Occupational History  . retired Music therapist    Social History Main Topics  . Smoking status: Former Smoker -- 3.00 packs/day for 40 years    Types: Cigarettes    Quit date: 09/15/1975  . Smokeless tobacco: Not on file  . Alcohol Use: Not on file  . Drug Use: Not on file  . Sexually Active: Not on file   Other Topics Concern  . Not on file   Social History Narrative  . No narrative on file    No family history on file.  Past Medical History  Diagnosis Date  . Hypertension   . Hyperlipidemia   . CAD (coronary artery disease)     9 STEMI 2001, PCI, RCA, residual 100% circumflex  /   PCI for in-stent restenosis June, 2001  /  nuclear February, 2010, no ischemia  . Emphysema     Mild  . GERD (gastroesophageal reflux disease)   . Cervical disc disease     Status post neurosurgery  . BPH (benign prostatic hypertrophy)   .  Paralyzed hemidiaphragm     Right  . Statin intolerance     Elevated CPK in the past  . Paralyzed hemidiaphragm     Chronic  . Carotid artery disease     Doppler, June, 2011, 0-39% bilateral  . Colon polyps   . Elevated CPK     Chronic mild CPK elevation  . Fatigue     Morning fatigue, August, 2011  . Ejection fraction     EF 55-65%, echo, 2009  . Low back pain     February, 2013    Past Surgical History  Procedure Laterality Date  . Cervical disc surgery    . Breast surgery      right  . Skin cancer excision      Patient Active Problem List  Diagnosis  . HYPERLIPIDEMIA-MIXED  . Fluid overload  . HYPERTENSION, BENIGN  . COPD (chronic obstructive pulmonary disease)  . DYSPNEA ON EXERTION  . LEG CRAMPS  . CAD (coronary artery disease)  . GERD (gastroesophageal reflux disease)  . Cervical disc disease  . BPH (benign prostatic hypertrophy)  . Paralyzed hemidiaphragm  . Statin intolerance  . Carotid  artery disease  . Elevated CPK  . Fatigue  . Ejection fraction  . Low back pain    ROS   Patient denies fever, chills, headache, sweats, rash, change in vision, change in hearing, chest pain, cough, nausea vomiting, urinary symptoms. All other systems are reviewed and are negative.  PHYSICAL EXAM  Patient is oriented to person time and place. Affect is normal. There is no jugulovenous distention. Lungs reveal scattered rhonchi. There is no respiratory distress. Cardiac exam reveals S1 and S2. The abdomen is soft. Is no peripheral edema.  Filed Vitals:   12/12/12 1041  BP: 160/78  Pulse: 84  Resp: 24  Height: 6\' 2"  (1.88 m)  Weight: 210 lb (95.255 kg)     ASSESSMENT & PLAN

## 2012-12-12 NOTE — Assessment & Plan Note (Signed)
Carotid Doppler done today showed no progression. He'll followup in one year.

## 2012-12-12 NOTE — Patient Instructions (Addendum)
Your physician wants you to follow-up in: 1 year with a carotid doppler the same day prior to your appt.   You will receive a reminder letter in the mail two months in advance. If you don't receive a letter, please call our office to schedule the follow-up appointment.  Your physician has requested that you have a carotid duplex in a year the same day as your f/u appt.  This test is an ultrasound of the carotid arteries in your neck. It looks at blood flow through these arteries that supply the brain with blood. Allow one hour for this exam. There are no restrictions or special instructions.

## 2012-12-12 NOTE — Assessment & Plan Note (Signed)
Historically the patient has had some mild fluid overload in the past. This is stable and not causing any problems at this time.

## 2013-01-06 ENCOUNTER — Encounter: Payer: Self-pay | Admitting: Pulmonary Disease

## 2013-01-06 ENCOUNTER — Ambulatory Visit (INDEPENDENT_AMBULATORY_CARE_PROVIDER_SITE_OTHER): Payer: Medicare Other | Admitting: Pulmonary Disease

## 2013-01-06 ENCOUNTER — Ambulatory Visit (INDEPENDENT_AMBULATORY_CARE_PROVIDER_SITE_OTHER)
Admission: RE | Admit: 2013-01-06 | Discharge: 2013-01-06 | Disposition: A | Discharge status: Home / Self Care | Payer: Medicare Other | Source: Ambulatory Visit | Attending: Pulmonary Disease | Admitting: Pulmonary Disease

## 2013-01-06 VITALS — BP 162/78 | HR 85 | Temp 97.1°F | Ht 75.0 in | Wt 210.0 lb

## 2013-01-06 DIAGNOSIS — J449 Chronic obstructive pulmonary disease, unspecified: Secondary | ICD-10-CM

## 2013-01-06 DIAGNOSIS — R0989 Other specified symptoms and signs involving the circulatory and respiratory systems: Secondary | ICD-10-CM

## 2013-01-06 DIAGNOSIS — R0609 Other forms of dyspnea: Secondary | ICD-10-CM

## 2013-01-06 MED ORDER — FLUTICASONE-SALMETEROL 250-50 MCG/DOSE IN AEPB: INHALATION_SPRAY | RESPIRATORY_TRACT | Status: DC

## 2013-01-06 MED ORDER — TIOTROPIUM BROMIDE MONOHYDRATE 18 MCG IN CAPS: 18.0000 ug | ORAL_CAPSULE | Freq: Every day | RESPIRATORY_TRACT | Status: DC

## 2013-01-06 MED ORDER — OMEPRAZOLE 20 MG PO CPDR: 20.0000 mg | DELAYED_RELEASE_CAPSULE | Freq: Two times a day (BID) | ORAL | Status: DC

## 2013-01-06 NOTE — Assessment & Plan Note (Signed)
The patient is having worsening dyspnea on exertion, but I do not see anything from a pulmonary standpoint to explain this.  I will do a chest x-ray today for completeness, but I wonder if he has had recurrence of his coronary disease given his past history.  He is having cough but I suspect his upper airway in origin, and leading to pseudo-wheezing.  He is having postnasal drip from allergies, and has a history of reflux disease.  We'll treat more aggressively for these entities.  We'll also consider changing his Advair to something that is less irritating to the upper airway.

## 2013-01-06 NOTE — Patient Instructions (Addendum)
Take your nasal spray 2 sprays each am everyday! Do not use afrin. Get chlorpheniramine 4mg  otc, take 2 each night at bedtime. Continue your breathing medications Increase your omeprazole (acid reflux medication) to am and pm If your shortness of breath continues to be an issue, we may consider a breathing device at night to help with your abnormal diaphragm.  followup with me in 3 weeks.

## 2013-01-06 NOTE — Assessment & Plan Note (Signed)
The patient has mild COPD, and there is nothing to suggest bronchospasm or acute exacerbation today.  I do not think this is the cause of his increasing dyspnea on exertion, and he is already on a very good bronchodilator regimen.

## 2013-01-06 NOTE — Addendum Note (Signed)
Addended by: Nita Sells on: 01/06/2013 11:05 AM   Modules accepted: Orders

## 2013-01-06 NOTE — Progress Notes (Signed)
  Subjective:    Patient ID: Tim [name], male    DOB: Aug 30, 1929, 79 y.o.   MRN: [id number]  HPI Patient comes in today for followup of his mild COPD, paralyzed hemidiaphragm, and ongoing dyspnea on exertion.  He has been having increased shortness of breath, and this can occur during the day or at night while trying to sleep.  He does have a paralyzed hemidiaphragm which will interfere with trying to lie flat while sleeping.  He will typically sleep in a recliner.  The patient has a history of coronary disease, and has had a recent echo that was unrevealing.  He is also complaining of audible wheezing that is clearly coming from his upper airway.  He is having a lot of postnasal drip, and has a history of reflux disease.  He is not on an ACE inhibitor currently.   Review of Systems  Constitutional: Negative for fever and unexpected weight change.  HENT: Positive for postnasal drip. Negative for ear pain, nosebleeds, congestion, sore throat, rhinorrhea, sneezing, trouble swallowing, dental problem and sinus pressure.   Eyes: Negative for redness and itching.  Respiratory: Positive for cough ( white mucus and yellow at times), shortness of breath and wheezing. Negative for chest tightness.   Cardiovascular: Negative for palpitations and leg swelling.  Gastrointestinal: Negative for nausea and vomiting.  Genitourinary: Negative for dysuria.  Musculoskeletal: Negative for joint swelling.  Skin: Negative for rash.  Neurological: Negative for headaches.  Hematological: Does not bruise/bleed easily.  Psychiatric/Behavioral: Negative for dysphoric mood. The patient is not nervous/anxious.        Objective:   Physical Exam Well-developed male in no acute distress Nose without purulence or discharge noted Oropharynx clear Neck without lymphadenopathy or thyromegaly Chest with a few basilar crackles, good air flow, no wheezing.  There is mild upper airway pseudo-wheezing noted. Cardiac exam  with regular rate and rhythm Lower extremities with no edema, no cyanosis Alert and oriented, moves all 4 extremities.       Assessment & Plan:

## 2013-01-09 ENCOUNTER — Telehealth: Payer: Self-pay | Admitting: Pulmonary Disease

## 2013-01-09 NOTE — Telephone Encounter (Signed)
Notes Recorded by Barbaraann Share, MD on 01/06/2013 at 2:17 PM Please let pt know that his cxr is stable from last visit. I do not see anything that would worsen his sob.      Spoke with pt and his daughter and notified of results per Dr. Shelle Iron. Pt verbalized understanding and denied any questions.

## 2013-01-17 ENCOUNTER — Telehealth: Payer: Self-pay | Admitting: Pulmonary Disease

## 2013-01-17 NOTE — Telephone Encounter (Signed)
[facility] and was advised by Joe his omprezole requires PA. I initiated PA and it was approved from 01/17/13-01/17/14. Case # [id number]. I called and made pt aware. Nothing further was needed

## 2013-02-01 ENCOUNTER — Ambulatory Visit (INDEPENDENT_AMBULATORY_CARE_PROVIDER_SITE_OTHER): Payer: Medicare Other | Admitting: Pulmonary Disease

## 2013-02-01 ENCOUNTER — Encounter: Payer: Self-pay | Admitting: Pulmonary Disease

## 2013-02-01 VITALS — BP 142/90 | HR 64 | Temp 97.4°F | Ht 74.0 in | Wt 207.8 lb

## 2013-02-01 DIAGNOSIS — K219 Gastro-esophageal reflux disease without esophagitis: Secondary | ICD-10-CM

## 2013-02-01 DIAGNOSIS — J310 Chronic rhinitis: Secondary | ICD-10-CM | POA: Insufficient documentation

## 2013-02-01 MED ORDER — OMEPRAZOLE 20 MG PO CPDR: 20.0000 mg | DELAYED_RELEASE_CAPSULE | Freq: Two times a day (BID) | ORAL | Status: DC

## 2013-02-01 MED ORDER — PREDNISONE 10 MG PO TABS: ORAL_TABLET | ORAL | Status: DC

## 2013-02-01 NOTE — Patient Instructions (Addendum)
Take your acid reflux medication twice a day for 2 more weeks, then go back to once a day.  If your symptoms worsen, ok to go back to twice a day Stay on your chlorpheniramine 4mg , 2 every night at bedtime until the middle of June, then can take as needed for allergy symptoms.  Can take during the day as well if having increased symptoms. Will treat with a short course of prednisone to see if your symptoms resolve quicker. Can try to stop one of your inhalers to see if you really need to be on both. Keep followup already scheduled with me.

## 2013-02-01 NOTE — Progress Notes (Signed)
  Subjective:    Patient ID: Tim [name], male    DOB: 08-09-1929, 79 y.o.   MRN: [id number]  HPI The patient comes in today for followup after his recent acute sick visit.  He had increased cough and congestion that I felt was more upper airway in origin.  He was treated for allergic rhinitis with postnasal drip, as well as increased treatment for reflux.  He has seen definite improvement since the last visit, but continues to have postnasal drip with a feeling of fullness in his throat from mucus.  He is also still having what sounds like upper airway pseudo-wheezing.  He is no longer having shortness of breath at night   Review of Systems  Constitutional: Negative for fever and unexpected weight change.  HENT: Negative for ear pain, nosebleeds, congestion, sore throat, rhinorrhea, sneezing, trouble swallowing, dental problem, postnasal drip and sinus pressure.   Eyes: Negative for redness and itching.  Respiratory: Positive for cough, shortness of breath and wheezing. Negative for chest tightness.   Cardiovascular: Negative for palpitations and leg swelling.  Gastrointestinal: Negative for nausea and vomiting.  Genitourinary: Negative for dysuria.  Musculoskeletal: Negative for joint swelling.  Skin: Negative for rash.  Neurological: Negative for headaches.  Hematological: Does not bruise/bleed easily.  Psychiatric/Behavioral: Negative for dysphoric mood. The patient is not nervous/anxious.        Objective:   Physical Exam Well-developed male in no acute distress Nose without purulence or discharge noted Oropharynx clear Neck without lymphadenopathy or thyromegaly Chest totally clear to auscultation Cardiac exam with regular rate and rhythm Lower extremities with mild edema, no cyanosis alert and oriented, moves all 4 extremities.       Assessment & Plan:

## 2013-02-01 NOTE — Assessment & Plan Note (Signed)
The patient has had significant postnasal drip with cough and mucus production that I suspect is coming from chronic rhinitis.  He is better with more consistent use of his nasal spray, as well as nightly use of an antihistamine.

## 2013-02-01 NOTE — Assessment & Plan Note (Signed)
The patient feels that his cough and reflux symptoms are much improved since the last visit.  I have asked him to continue on b.i.d. Proton pump inhibitor for a few weeks more, then try to get down to once a day again.

## 2013-03-27 ENCOUNTER — Other Ambulatory Visit: Payer: Self-pay | Admitting: Cardiology

## 2013-07-12 ENCOUNTER — Ambulatory Visit: Payer: Medicare Other | Admitting: Physician Assistant

## 2013-07-12 ENCOUNTER — Encounter: Payer: Self-pay | Admitting: Cardiovascular Disease

## 2013-07-12 ENCOUNTER — Ambulatory Visit (INDEPENDENT_AMBULATORY_CARE_PROVIDER_SITE_OTHER): Payer: Medicare Other | Admitting: Cardiovascular Disease

## 2013-07-12 VITALS — BP 150/80 | HR 71 | Ht 74.0 in | Wt 210.0 lb

## 2013-07-12 DIAGNOSIS — I2510 Atherosclerotic heart disease of native coronary artery without angina pectoris: Secondary | ICD-10-CM

## 2013-07-12 DIAGNOSIS — I10 Essential (primary) hypertension: Secondary | ICD-10-CM

## 2013-07-12 DIAGNOSIS — R079 Chest pain, unspecified: Secondary | ICD-10-CM

## 2013-07-12 MED ORDER — FUROSEMIDE 40 MG PO TABS: 40.0000 mg | ORAL_TABLET | Freq: Every day | ORAL | Status: DC

## 2013-07-12 NOTE — Patient Instructions (Signed)
Your physician has recommended you make the following change in your medication: INCREASE Furosemide to 40mg  take one by mouth daily  Your physician has requested that you have a lexiscan myoview. For further information please visit [email or web address] Please follow instruction sheet, as given.  Your physician recommends that you schedule a follow-up appointment in: 2-3 WEEKS with Dr Myrtis Ser

## 2013-07-12 NOTE — Progress Notes (Signed)
HPI:  79 year old gentleman who has been followed by Dr. Myrtis Ser. He is here for an acute visit today because of chest pain. The patient does have a history of coronary artery disease with myocardial infarction in 2001. He has undergone stenting of the right coronary artery as well as cutting balloon angioplasty for treatment of in-stent restenosis. These procedures were done over 10 years ago. He was noted to have an occluded left circumflex with collaterals.  The patient complains of a constant 'tightness' in his chest. There may be some worsening with exertion but no clear correlation. He also has mild dyspnea. No edema or palpitations. His chest pain is nonradiating. No lightheadedness, nausea, or diaphoresis. He reports atypical symptoms at the time of his MI and really has not had chest pain in the past.   Outpatient Encounter Prescriptions as of 07/12/2013  Medication Sig Dispense Refill  . acetaminophen (TYLENOL) 500 MG tablet Take 500 mg by mouth as needed.        [name] aspirin 81 MG tablet Take 1 tablet (81 mg total) by mouth daily.  30 tablet  6  . atenolol (TENORMIN) 50 MG tablet TAKE 1 TABLET DAILY  90 tablet  3  . finasteride (PROSCAR) 5 MG tablet Take 1 tablet by mouth daily.      . Fluticasone-Salmeterol (ADVAIR DISKUS) 250-50 MCG/DOSE AEPB INHALE 1 DOSE BY MOUTH TWICE DAILY. RINSE MOUTH AFTER USE  60 each  6  . furosemide (LASIX) 20 MG tablet Take 1 tablet (20 mg total) by mouth daily as needed.  90 tablet  2  . Naproxen Sodium (ALEVE) 220 MG CAPS Take by mouth as needed.        [name] omeprazole (PRILOSEC) 20 MG capsule Take 1 capsule (20 mg total) by mouth 2 (two) times daily.  60 capsule  0  . oxymetazoline (AFRIN) 0.05 % nasal spray Place into the nose as needed.        . predniSONE (DELTASONE) 10 MG tablet Take 4 tabs po x 2 days, then 3 x 2 days, then 2 x 2 days then stop.  18 tablet  0  . promethazine-dextromethorphan (PROMETHAZINE-DM) 6.25-15 MG/5ML syrup PRN at bedtime      .  tiotropium (SPIRIVA HANDIHALER) 18 MCG inhalation capsule Place 1 capsule (18 mcg total) into inhaler and inhale daily.  30 capsule  6  . VENTOLIN HFA 108 (90 BASE) MCG/ACT inhaler INHALE 1-2 PUFFS BY MOUTH EVERY 4-6 HOURS AS NEEDED  18 g  2  . fluticasone (FLONASE) 50 MCG/ACT nasal spray Place 2 sprays into the nose daily as needed.      . [DISCONTINUED] chlorpheniramine (SB CHLORPHENIRAMINE) 4 MG tablet Take 8 mg by mouth at bedtime.       No facility-administered encounter medications on file as of 07/12/2013.    No Known Allergies  Past Medical History  Diagnosis Date  . Hypertension   . Hyperlipidemia   . CAD (coronary artery disease)     9 STEMI 2001, PCI, RCA, residual 100% circumflex  /   PCI for in-stent restenosis June, 2001  /  nuclear February, 2010, no ischemia  . Emphysema     Mild  . GERD (gastroesophageal reflux disease)   . Cervical disc disease     Status post neurosurgery  . BPH (benign prostatic hypertrophy)   . Paralyzed hemidiaphragm     Right  . Statin intolerance     Elevated CPK in the past  . Paralyzed hemidiaphragm  Chronic  . Carotid artery disease     Doppler, June, 2011, 0-39% bilateral  . Colon polyps   . Elevated CPK     Chronic mild CPK elevation  . Fatigue     Morning fatigue, August, 2011  . Ejection fraction     EF 55-65%, echo, 2009  . Low back pain     February, 2013    ROS: Negative except as per HPI  BP 150/80  Pulse 71  Ht 6\' 2"  (1.88 m)  Wt 210 lb (95.255 kg)  BMI 26.95 kg/m2  PHYSICAL EXAM: Pt is alert and oriented, pleasant elderly male in NAD HEENT: normal Neck: JVP - normal, carotids 2+= without bruits Lungs: scattered rhonchi CV: RRR without murmur or gallop Abd: soft, NT, Positive BS, no hepatomegaly Ext: no C/C/E, distal pulses intact and equal Skin: warm/dry no rash  EKG:  Normal sinus rhythm 71 beats per minute, cannot rule out age indeterminate inferior infarct.  2-D echocardiogram 10/31/2012: Study  Conclusions  - Left ventricle: The cavity size was normal. Wall thickness was increased in a pattern of mild LVH. There was focal basal hypertrophy. Systolic function was normal. The estimated ejection fraction was in the range of 55% to 60%. Wall motion was normal; there were no regional wall motion abnormalities. - Aortic valve: Trivial regurgitation. - Left atrium: The atrium was moderately dilated. - Right atrium: The atrium was moderately dilated.  ASSESSMENT AND PLAN: 1. Chest pain with known CAD, typical and atypical features. Recommend a Lexiscan Myoview to evaluate for ischemia. Other consideration is diastolic heart failure as there is a constancy to his symptoms raising the possibility of elevated filling pressures. Will increase lasix to 40 mg daily to assess for therapeutic response. Will arrange follow-up visit with Dr Myrtis Ser after his Myoview is completed. Echo form 2/14 reviewed as above.   2. Hyperlipidemia: statin intolerant  Tonny Bollman 07/15/2013 7:37 PM

## 2013-07-15 ENCOUNTER — Encounter: Payer: Self-pay | Admitting: Cardiovascular Disease

## 2013-07-31 ENCOUNTER — Encounter: Payer: Self-pay | Admitting: Cardiology

## 2013-08-01 ENCOUNTER — Ambulatory Visit (HOSPITAL_COMMUNITY): Payer: Medicare Other | Attending: Cardiology | Admitting: Radiology

## 2013-08-01 ENCOUNTER — Encounter: Payer: Self-pay | Admitting: Internal Medicine

## 2013-08-01 VITALS — BP 183/96 | Ht 74.0 in | Wt 206.0 lb

## 2013-08-01 DIAGNOSIS — E785 Hyperlipidemia, unspecified: Secondary | ICD-10-CM | POA: Insufficient documentation

## 2013-08-01 DIAGNOSIS — R0609 Other forms of dyspnea: Secondary | ICD-10-CM | POA: Insufficient documentation

## 2013-08-01 DIAGNOSIS — I2510 Atherosclerotic heart disease of native coronary artery without angina pectoris: Secondary | ICD-10-CM

## 2013-08-01 DIAGNOSIS — J449 Chronic obstructive pulmonary disease, unspecified: Secondary | ICD-10-CM | POA: Insufficient documentation

## 2013-08-01 DIAGNOSIS — R0602 Shortness of breath: Secondary | ICD-10-CM | POA: Insufficient documentation

## 2013-08-01 DIAGNOSIS — I779 Disorder of arteries and arterioles, unspecified: Secondary | ICD-10-CM | POA: Insufficient documentation

## 2013-08-01 DIAGNOSIS — R0989 Other specified symptoms and signs involving the circulatory and respiratory systems: Secondary | ICD-10-CM | POA: Insufficient documentation

## 2013-08-01 DIAGNOSIS — Z87891 Personal history of nicotine dependence: Secondary | ICD-10-CM | POA: Insufficient documentation

## 2013-08-01 DIAGNOSIS — Z8249 Family history of ischemic heart disease and other diseases of the circulatory system: Secondary | ICD-10-CM | POA: Insufficient documentation

## 2013-08-01 DIAGNOSIS — R079 Chest pain, unspecified: Secondary | ICD-10-CM

## 2013-08-01 DIAGNOSIS — R0789 Other chest pain: Secondary | ICD-10-CM | POA: Insufficient documentation

## 2013-08-01 DIAGNOSIS — Z9861 Coronary angioplasty status: Secondary | ICD-10-CM | POA: Insufficient documentation

## 2013-08-01 DIAGNOSIS — J4489 Other specified chronic obstructive pulmonary disease: Secondary | ICD-10-CM | POA: Insufficient documentation

## 2013-08-01 DIAGNOSIS — I252 Old myocardial infarction: Secondary | ICD-10-CM | POA: Insufficient documentation

## 2013-08-01 DIAGNOSIS — I10 Essential (primary) hypertension: Secondary | ICD-10-CM | POA: Insufficient documentation

## 2013-08-01 MED ORDER — TECHNETIUM TC 99M SESTAMIBI GENERIC - CARDIOLITE
11.0000 | Freq: Once | INTRAVENOUS | Status: AC | PRN
  Administered 2013-08-01: 11 via INTRAVENOUS

## 2013-08-01 MED ORDER — TECHNETIUM TC 99M SESTAMIBI GENERIC - CARDIOLITE
33.0000 | Freq: Once | INTRAVENOUS | Status: AC | PRN
  Administered 2013-08-01: 33 via INTRAVENOUS

## 2013-08-01 MED ORDER — REGADENOSON 0.4 MG/5ML IV SOLN
0.4000 mg | Freq: Once | INTRAVENOUS | Status: AC
  Administered 2013-08-01: 0.4 mg via INTRAVENOUS

## 2013-08-01 NOTE — Progress Notes (Signed)
[facility] SITE 3 NUCLEAR MED [address] [place], [place] [address] [phone]    Cardiology Nuclear Med Study  Tim [name] is a 79 y.o. male     MRN : [id number]     DOB: 11-Jul-1929  Procedure Date: 08/01/2013  Nuclear Med Background Indication for Stress Test:  Evaluation for Ischemia and PTCA/Stent Patency History:  Asthma, COPD and MPI: 11/07/08 SCAR: Prior inferior infarct vs. thinning (ischemia) EF: 73% MI-Stent/PTCA RCA/ISR/RCA 10/31/12 ECHO: EF: 60% Cardiac Risk Factors: Carotid Disease, Family History - CAD, History of Smoking, Hypertension and Lipids  Symptoms:  Chest Pain, Chest Tightness, DOE and SOB   Nuclear Pre-Procedure Caffeine/Decaff Intake:  None > 12 hrs NPO After: 6:00pm   Lungs:  clear O2 Sat: 95% on room air. IV 0.9% NS with Angio Cath:  22g  IV Site: R Antecubital x 1, tolerated well IV Started by:  Irean Hong, RN  Chest Size (in):  42 Cup Size: n/a  Height: 6\' 2"  (1.88 m)  Weight:  206 lb (93.441 kg)  BMI:  Body mass index is 26.44 kg/(m^2). Tech Comments:  Held atenolol x 24 hrs    Nuclear Med Study 1 or 2 day study: 1 day  Stress Test Type:  Lexiscan  Reading MD: Olga Millers, MD  Order Authorizing Provider:  Tonny Bollman, MD, and Willa Rough, MD  Resting Radionuclide: Technetium 56m Sestamibi  Resting Radionuclide Dose: 11.0 mCi   Stress Radionuclide:  Technetium 9m Sestamibi  Stress Radionuclide Dose: 33.0 mCi           Stress Protocol Rest HR: 61 Stress HR: 78  Rest BP: 183/96 Stress BP: 180/88  Exercise Time (min): n/a METS: n/a   Predicted Max HR: 138 bpm % Max HR: 56.52 bpm Rate Pressure Product: 21308   Dose of Adenosine (mg):  n/a Dose of Lexiscan: 0.4 mg  Dose of Atropine (mg): n/a Dose of Dobutamine: n/a mcg/kg/min (at max HR)  Stress Test Technologist: Milana Na, EMT-P  Nuclear Technologist:  Domenic Polite, CNMT     Rest Procedure:  Myocardial perfusion imaging was performed at rest 45  minutes following the intravenous administration of Technetium 47m Sestamibi. Rest ECG: NSR - Normal EKG  Stress Procedure:  The patient received IV Lexiscan 0.4 mg over 15-seconds.  Technetium 37m Sestamibi injected at 30-seconds. This patient had sob, chest tightness, and a headache with the Lexiscan injection. Quantitative spect images were obtained after a 45 minute delay. Stress ECG: No significant ST segment change suggestive of ischemia.  QPS Raw Data Images:  Acquisition technically good; normal left ventricular size. Stress Images:  Normal homogeneous uptake in all areas of the myocardium. Rest Images:  Normal homogeneous uptake in all areas of the myocardium. Subtraction (SDS):  No evidence of ischemia. Transient Ischemic Dilatation (Normal <1.22):  0.96 Lung/Heart Ratio (Normal <0.45):  0.32  Quantitative Gated Spect Images QGS EDV:  84 ml QGS ESV:  32 ml  Impression Exercise Capacity:  Lexiscan with no exercise. BP Response:  Normal blood pressure response. Clinical Symptoms:  There is chest pain and dyspnea. ECG Impression:  No significant ST segment change suggestive of ischemia. Comparison with Prior Nuclear Study: No significant change from previous study  Overall Impression:  Normal stress nuclear study.  LV Ejection Fraction: 62%.  LV Wall Motion:  NL LV Function; NL Wall Motion   Olga Millers

## 2013-08-03 ENCOUNTER — Encounter: Payer: Self-pay | Admitting: Cardiology

## 2013-08-04 ENCOUNTER — Encounter: Payer: Self-pay | Admitting: Pulmonary Disease

## 2013-08-04 ENCOUNTER — Ambulatory Visit (INDEPENDENT_AMBULATORY_CARE_PROVIDER_SITE_OTHER): Payer: Medicare Other | Admitting: Cardiology

## 2013-08-04 ENCOUNTER — Encounter: Payer: Self-pay | Admitting: Cardiology

## 2013-08-04 ENCOUNTER — Ambulatory Visit (INDEPENDENT_AMBULATORY_CARE_PROVIDER_SITE_OTHER): Payer: Medicare Other | Admitting: Pulmonary Disease

## 2013-08-04 VITALS — BP 142/92 | HR 65 | Temp 97.4°F | Ht 74.0 in | Wt 208.0 lb

## 2013-08-04 VITALS — BP 134/70 | HR 70 | Ht 74.0 in | Wt 206.0 lb

## 2013-08-04 DIAGNOSIS — J449 Chronic obstructive pulmonary disease, unspecified: Secondary | ICD-10-CM

## 2013-08-04 DIAGNOSIS — I10 Essential (primary) hypertension: Secondary | ICD-10-CM

## 2013-08-04 DIAGNOSIS — E785 Hyperlipidemia, unspecified: Secondary | ICD-10-CM

## 2013-08-04 DIAGNOSIS — J986 Disorders of diaphragm: Secondary | ICD-10-CM

## 2013-08-04 DIAGNOSIS — I2510 Atherosclerotic heart disease of native coronary artery without angina pectoris: Secondary | ICD-10-CM

## 2013-08-04 DIAGNOSIS — I779 Disorder of arteries and arterioles, unspecified: Secondary | ICD-10-CM

## 2013-08-04 MED ORDER — ALBUTEROL SULFATE HFA 108 (90 BASE) MCG/ACT IN AERS: INHALATION_SPRAY | RESPIRATORY_TRACT | Status: DC

## 2013-08-04 NOTE — Patient Instructions (Signed)
Your physician recommends that you continue on your current medications as directed. Please refer to the Current Medication list given to you today.  Your physician wants you to follow-up in: 1 year. You will receive a reminder letter in the mail two months in advance. If you don't receive a letter, please call our office to schedule the follow-up appointment.  

## 2013-08-04 NOTE — Progress Notes (Signed)
HPI  The patient is seen to followup his overall cardiac status. He saw Dr. Excell Seltzer in October, 2014. He had some chest tightness at that time. He's been stable since then. He mentions to me that he was off aspirin at that time. He had stopped it on his own in preparation for surgery to a skin lesion on his nose. He stopped it many weeks ahead of time. He's back on his aspirin. He had a nuclear scan done in the past few days that showed normal ejection fraction and no evidence of ischemia. He's not having active chest tightness at this time.  No Known Allergies  Current Outpatient Prescriptions  Medication Sig Dispense Refill  . acetaminophen (TYLENOL) 500 MG tablet Take 500 mg by mouth as needed.        [name] aspirin 81 MG tablet Take 1 tablet (81 mg total) by mouth daily.  30 tablet  6  . atenolol (TENORMIN) 50 MG tablet TAKE 1 TABLET DAILY  90 tablet  3  . finasteride (PROSCAR) 5 MG tablet Take 1 tablet by mouth daily.      . fluticasone (FLONASE) 50 MCG/ACT nasal spray Place 2 sprays into the nose daily as needed.      . Fluticasone-Salmeterol (ADVAIR DISKUS) 250-50 MCG/DOSE AEPB INHALE 1 DOSE BY MOUTH TWICE DAILY. RINSE MOUTH AFTER USE  60 each  6  . furosemide (LASIX) 40 MG tablet Take 1 tablet (40 mg total) by mouth daily.  90 tablet  0  . Naproxen Sodium (ALEVE) 220 MG CAPS Take by mouth as needed.        [name] omeprazole (PRILOSEC) 20 MG capsule Take 1 capsule (20 mg total) by mouth 2 (two) times daily.  60 capsule  0  . oxymetazoline (AFRIN) 0.05 % nasal spray Place into the nose as needed.        . predniSONE (DELTASONE) 10 MG tablet Take 4 tabs po x 2 days, then 3 x 2 days, then 2 x 2 days then stop.  18 tablet  0  . promethazine-dextromethorphan (PROMETHAZINE-DM) 6.25-15 MG/5ML syrup PRN at bedtime      . tiotropium (SPIRIVA HANDIHALER) 18 MCG inhalation capsule Place 1 capsule (18 mcg total) into inhaler and inhale daily.  30 capsule  6  . VENTOLIN HFA 108 (90 BASE) MCG/ACT inhaler  INHALE 1-2 PUFFS BY MOUTH EVERY 4-6 HOURS AS NEEDED  18 g  2   No current facility-administered medications for this visit.    History   Social History  . Marital Status: Married    Spouse Name: N/A    Number of Children: N/A  . Years of Education: N/A   Occupational History  . retired Music therapist    Social History Main Topics  . Smoking status: Former Smoker -- 3.00 packs/day for 40 years    Types: Cigarettes    Quit date: 09/15/1975  . Smokeless tobacco: Not on file  . Alcohol Use: Not on file  . Drug Use: Not on file  . Sexual Activity: Not on file   Other Topics Concern  . Not on file   Social History Narrative  . No narrative on file    No family history on file.  Past Medical History  Diagnosis Date  . Hypertension   . Hyperlipidemia   . CAD (coronary artery disease)     9 STEMI 2001, PCI, RCA, residual 100% circumflex  /   PCI for in-stent restenosis June, 2001  /  nuclear  February, 2010, no ischemia  . Emphysema     Mild  . GERD (gastroesophageal reflux disease)   . Cervical disc disease     Status post neurosurgery  . BPH (benign prostatic hypertrophy)   . Paralyzed hemidiaphragm     Right  . Statin intolerance     Elevated CPK in the past  . Paralyzed hemidiaphragm     Chronic  . Carotid artery disease     Doppler, June, 2011, 0-39% bilateral  . Colon polyps   . Elevated CPK     Chronic mild CPK elevation  . Fatigue     Morning fatigue, August, 2011  . Ejection fraction     EF 55-65%, echo, 2009  . Low back pain     February, 2013    Past Surgical History  Procedure Laterality Date  . Cervical disc surgery    . Breast surgery      right  . Skin cancer excision      Patient Active Problem List   Diagnosis Date Noted  . Chronic rhinitis 02/01/2013  . Low back pain   . CAD (coronary artery disease)   . GERD (gastroesophageal reflux disease)   . Cervical disc disease   . BPH (benign prostatic hypertrophy)   . Paralyzed hemidiaphragm    . Statin intolerance   . Carotid artery disease   . Elevated CPK   . Fatigue   . Ejection fraction   . LEG CRAMPS 11/21/2010  . Fluid overload 05/29/2009  . HYPERLIPIDEMIA-MIXED 11/01/2008  . HYPERTENSION, BENIGN 11/01/2008  . COPD (chronic obstructive pulmonary disease) 07/14/2007  . DYSPNEA ON EXERTION 07/14/2007    ROS   Patient denies fever, chills, headache, sweats, rash, change in vision, change in hearing, chest pain, cough, nausea vomiting, urinary symptoms. All other systems are reviewed and are negative.  PHYSICAL EXAM  Patient is oriented to person time and place. Affect is normal. He has a surgical skin lesion healing well and his nose. There is no jugulovenous distention. Lungs are clear. Respiratory effort is nonlabored. Cardiac exam reveals S1-S2. There no clicks or significant murmurs. The abdomen is soft. There is no peripheral edema.  Filed Vitals:   08/04/13 0829  BP: 134/70  Pulse: 70  Height: 6\' 2"  (1.88 m)  Weight: 206 lb (93.441 kg)  SpO2: 98%     ASSESSMENT & PLAN

## 2013-08-04 NOTE — Assessment & Plan Note (Signed)
The patient appears to be stable from a pulmonary standpoint, and I have asked him to continue on his current regimen.  It is unclear if his morning mucus is coming from his chest or from postnasal drip, and it may be both.  I would like to try him back on his antihistamine at bedtime, and we'll also try Mucinex as well.  If he continues to have issues, we can discuss a flutter valve, and possibly albuterol nebs first thing in the morning to help him get through the tenacious mucus.

## 2013-08-04 NOTE — Assessment & Plan Note (Signed)
Blood pressure is controlled. No change in therapy. 

## 2013-08-04 NOTE — Patient Instructions (Signed)
Stay on your current breathing medications Stay on nasal spray, and try chlorpheniramine 4mg , 2 at bedtime each night to see if helps AM mucus Try mucinex plain, 2 each evening to see if helps thin mucus.  If it does, can actually take 2 am and pm.  Will send a prescription for your ventolin rescue inhaler.  followup with me in 6mos.

## 2013-08-04 NOTE — Assessment & Plan Note (Signed)
Coronary disease is stable. His nuclear scan shows no ischemia. No further workup.

## 2013-08-04 NOTE — Assessment & Plan Note (Signed)
The patient is statin intolerant. He has had recurrent CPK elevations with the use of statin. No change in therapy.

## 2013-08-04 NOTE — Progress Notes (Signed)
  Subjective:    Patient ID: Tim [name], male    DOB: August 01, 1929, 79 y.o.   MRN: [id number]  HPI The patient comes in today for followup of his known COPD.  He is doing fairly well from a pulmonary standpoint, and found that he did much better on Advair and Spiriva rather than just one medication.  He has had his flu shot this year, and feels that his exertional tolerance is at baseline.  His main complaint is that of significant mucus either in his throat or chest upon awakening in the morning.  He is staying on his nasal spray, but did not keep using his antihistamine at bedtime from back in the spring.  He thinks this did help, and I have asked him to restart.  He has not had a chest infection or acute exacerbation since last visit.   Review of Systems  Constitutional: Negative for fever and unexpected weight change.  HENT: Positive for congestion. Negative for dental problem, ear pain, nosebleeds, postnasal drip, rhinorrhea, sinus pressure, sneezing, sore throat and trouble swallowing.   Eyes: Negative for redness and itching.  Respiratory: Positive for cough. Negative for chest tightness, shortness of breath and wheezing.   Cardiovascular: Negative for palpitations and leg swelling.  Gastrointestinal: Negative for nausea and vomiting.  Genitourinary: Negative for dysuria.  Musculoskeletal: Negative for joint swelling.  Skin: Negative for rash.  Neurological: Negative for headaches.  Hematological: Does not bruise/bleed easily.  Psychiatric/Behavioral: Negative for dysphoric mood. The patient is not nervous/anxious.        Objective:   Physical Exam Well-developed male in no acute distress Nose without purulence or discharge noted Neck without lymphadenopathy or thyromegaly Chest with mildly decreased breath sounds, no wheezing Cardiac exam with regular rate and rhythm Lower extremities without edema, no cyanosis Alert and oriented, moves all 4 extremities.        Assessment & Plan:

## 2013-08-04 NOTE — Assessment & Plan Note (Signed)
Carotid disease is stable. Dopplers are being followed carefully.

## 2013-09-17 ENCOUNTER — Other Ambulatory Visit: Payer: Self-pay | Admitting: Cardiovascular Disease

## 2013-12-11 ENCOUNTER — Other Ambulatory Visit (HOSPITAL_COMMUNITY): Payer: Self-pay | Admitting: Cardiology

## 2013-12-11 DIAGNOSIS — I6529 Occlusion and stenosis of unspecified carotid artery: Secondary | ICD-10-CM

## 2013-12-19 ENCOUNTER — Encounter: Payer: Self-pay | Admitting: Cardiovascular Disease

## 2013-12-19 ENCOUNTER — Encounter: Payer: Self-pay | Admitting: Cardiology

## 2013-12-19 ENCOUNTER — Ambulatory Visit (INDEPENDENT_AMBULATORY_CARE_PROVIDER_SITE_OTHER): Payer: Medicare Other | Admitting: Cardiology

## 2013-12-19 ENCOUNTER — Ambulatory Visit (HOSPITAL_COMMUNITY): Payer: Medicare Other | Attending: Cardiovascular Disease | Admitting: Cardiology

## 2013-12-19 VITALS — BP 168/88 | HR 67 | Ht 74.0 in | Wt 205.8 lb

## 2013-12-19 DIAGNOSIS — I10 Essential (primary) hypertension: Secondary | ICD-10-CM

## 2013-12-19 DIAGNOSIS — I779 Disorder of arteries and arterioles, unspecified: Secondary | ICD-10-CM

## 2013-12-19 DIAGNOSIS — I6529 Occlusion and stenosis of unspecified carotid artery: Secondary | ICD-10-CM

## 2013-12-19 DIAGNOSIS — I2510 Atherosclerotic heart disease of native coronary artery without angina pectoris: Secondary | ICD-10-CM

## 2013-12-19 DIAGNOSIS — I739 Peripheral vascular disease, unspecified: Secondary | ICD-10-CM

## 2013-12-19 DIAGNOSIS — E785 Hyperlipidemia, unspecified: Secondary | ICD-10-CM

## 2013-12-19 NOTE — Progress Notes (Signed)
Carotid duplex complete 

## 2013-12-19 NOTE — Assessment & Plan Note (Signed)
His Doppler today shows stable disease. Followup will be done 1 year.

## 2013-12-19 NOTE — Assessment & Plan Note (Signed)
Blood pressure is under control. He forgot his medicines this morning. He says this is very uncommon. Pressure taken yesterday and another setting was normal.

## 2013-12-19 NOTE — Assessment & Plan Note (Signed)
Coronary disease is stable. No further workup needed. 

## 2013-12-19 NOTE — Progress Notes (Signed)
Patient ID: Tim [name], male   DOB: May 02, 1929, 80 y.o.   MRN: [id number]    HPI  Patient is seen today to followup his overall cardiac status. I saw him last November, 2014. He stable. He has not had any significant return of chest pain. He had a carotid Doppler today. He is stable 40-59% bilateral disease.  No Known Allergies  Current Outpatient Prescriptions  Medication Sig Dispense Refill  . acetaminophen (TYLENOL) 500 MG tablet Take 500 mg by mouth as needed.        [name] albuterol (VENTOLIN HFA) 108 (90 BASE) MCG/ACT inhaler INHALE 1-2 PUFFS BY MOUTH EVERY 4-6 HOURS AS NEEDED  1 Inhaler  6  . aspirin 81 MG tablet Take 1 tablet (81 mg total) by mouth daily.  30 tablet  6  . atenolol (TENORMIN) 50 MG tablet TAKE 1 TABLET DAILY  90 tablet  3  . finasteride (PROSCAR) 5 MG tablet Take 1 tablet by mouth daily.      . fluticasone (FLONASE) 50 MCG/ACT nasal spray Place 2 sprays into the nose daily as needed.      . Fluticasone-Salmeterol (ADVAIR DISKUS) 250-50 MCG/DOSE AEPB INHALE 1 DOSE BY MOUTH TWICE DAILY. RINSE MOUTH AFTER USE  60 each  6  . furosemide (LASIX) 40 MG tablet TAKE 1 TABLET DAILY  90 tablet  1  . omeprazole (PRILOSEC) 20 MG capsule Take 1 capsule (20 mg total) by mouth 2 (two) times daily.  60 capsule  0  . oxymetazoline (AFRIN) 0.05 % nasal spray Place into the nose as needed.        . tiotropium (SPIRIVA HANDIHALER) 18 MCG inhalation capsule Place 1 capsule (18 mcg total) into inhaler and inhale daily.  30 capsule  6   No current facility-administered medications for this visit.    History   Social History  . Marital Status: Married    Spouse Name: N/A    Number of Children: N/A  . Years of Education: N/A   Occupational History  . retired Games developer    Social History Main Topics  . Smoking status: Former Smoker -- 3.00 packs/day for 40 years    Types: Cigarettes    Quit date: 09/15/1975  . Smokeless tobacco: Not on file  . Alcohol Use: Not on file  . Drug  Use: Not on file  . Sexual Activity: Not on file   Other Topics Concern  . Not on file   Social History Narrative  . No narrative on file    No family history on file.  Past Medical History  Diagnosis Date  . Hypertension   . Hyperlipidemia   . CAD (coronary artery disease)     9 STEMI 2001, PCI, RCA, residual 100% circumflex  /   PCI for in-stent restenosis June, 2001  /  nuclear February, 2010, no ischemia  . Emphysema     Mild  . GERD (gastroesophageal reflux disease)   . Cervical disc disease     Status post neurosurgery  . BPH (benign prostatic hypertrophy)   . Paralyzed hemidiaphragm     Right  . Statin intolerance     Elevated CPK in the past  . Paralyzed hemidiaphragm     Chronic  . Carotid artery disease     Doppler, June, 2011, 0-39% bilateral  . Colon polyps   . Elevated CPK     Chronic mild CPK elevation  . Fatigue     Morning fatigue, August, 2011  . Ejection  fraction     EF 55-65%, echo, 2009  . Low back pain     February, 2013    Past Surgical History  Procedure Laterality Date  . Cervical disc surgery    . Breast surgery      right  . Skin cancer excision      Patient Active Problem List   Diagnosis Date Noted  . Chronic rhinitis 02/01/2013  . Low back pain   . CAD (coronary artery disease)   . GERD (gastroesophageal reflux disease)   . Cervical disc disease   . BPH (benign prostatic hypertrophy)   . Paralyzed hemidiaphragm   . Statin intolerance   . Carotid artery disease   . Elevated CPK   . Fatigue   . Ejection fraction   . LEG CRAMPS 11/21/2010  . Fluid overload 05/29/2009  . HYPERLIPIDEMIA-MIXED 11/01/2008  . HYPERTENSION, BENIGN 11/01/2008  . COPD (chronic obstructive pulmonary disease) 07/14/2007  . DYSPNEA ON EXERTION 07/14/2007    ROS   Patient denies fever, chills, headache, sweats, rash, change in vision, change in hearing, chest pain, cough, nausea vomiting, urinary symptoms. All other systems are reviewed and are  negative.  PHYSICAL EXAM  Patient is oriented to person time and place. Affect is normal. He looks quite good. There is no jugulovenous distention. Lungs are clear. Respiratory effort is nonlabored. Cardiac exam reveals S1 and S2. The abdomen is soft. Is no peripheral edema.  Filed Vitals:   12/19/13 1048  BP: 168/88  Pulse: 67  Height: 6\' 2"  (1.88 m)  Weight: 205 lb 12.8 oz (93.35 kg)     ASSESSMENT & PLAN

## 2013-12-19 NOTE — Assessment & Plan Note (Signed)
He cannot use a statin because of recurrent elevated CPK from statins

## 2013-12-19 NOTE — Patient Instructions (Signed)
Your physician recommends that you continue on your current medications as directed. Please refer to the Current Medication list given to you today.  Your physician recommends that you schedule a follow-up appointment in: as previously planned  

## 2013-12-22 ENCOUNTER — Encounter: Payer: Self-pay | Admitting: Cardiology

## 2013-12-27 ENCOUNTER — Encounter (HOSPITAL_COMMUNITY): Payer: Self-pay | Admitting: Emergency Medicine

## 2013-12-27 ENCOUNTER — Emergency Department (HOSPITAL_COMMUNITY): Payer: Medicare Other

## 2013-12-27 ENCOUNTER — Emergency Department (HOSPITAL_COMMUNITY)
Admission: EM | Admit: 2013-12-27 | Discharge: 2013-12-27 | Disposition: A | Discharge status: Home / Self Care | Payer: Medicare Other | Attending: Emergency Medicine | Admitting: Emergency Medicine

## 2013-12-27 ENCOUNTER — Encounter: Payer: Self-pay | Admitting: Cardiology

## 2013-12-27 DIAGNOSIS — E785 Hyperlipidemia, unspecified: Secondary | ICD-10-CM | POA: Insufficient documentation

## 2013-12-27 DIAGNOSIS — I10 Essential (primary) hypertension: Secondary | ICD-10-CM | POA: Insufficient documentation

## 2013-12-27 DIAGNOSIS — R0600 Dyspnea, unspecified: Secondary | ICD-10-CM

## 2013-12-27 DIAGNOSIS — R079 Chest pain, unspecified: Secondary | ICD-10-CM

## 2013-12-27 DIAGNOSIS — Z91199 Patient's noncompliance with other medical treatment and regimen due to unspecified reason: Secondary | ICD-10-CM | POA: Insufficient documentation

## 2013-12-27 DIAGNOSIS — Z9119 Patient's noncompliance with other medical treatment and regimen: Secondary | ICD-10-CM | POA: Insufficient documentation

## 2013-12-27 DIAGNOSIS — Z860100 Personal history of colon polyps, unspecified: Secondary | ICD-10-CM | POA: Insufficient documentation

## 2013-12-27 DIAGNOSIS — J438 Other emphysema: Secondary | ICD-10-CM | POA: Insufficient documentation

## 2013-12-27 DIAGNOSIS — Z87891 Personal history of nicotine dependence: Secondary | ICD-10-CM | POA: Insufficient documentation

## 2013-12-27 DIAGNOSIS — Z7982 Long term (current) use of aspirin: Secondary | ICD-10-CM | POA: Insufficient documentation

## 2013-12-27 DIAGNOSIS — I252 Old myocardial infarction: Secondary | ICD-10-CM | POA: Insufficient documentation

## 2013-12-27 DIAGNOSIS — Z79899 Other long term (current) drug therapy: Secondary | ICD-10-CM | POA: Insufficient documentation

## 2013-12-27 DIAGNOSIS — R609 Edema, unspecified: Secondary | ICD-10-CM | POA: Insufficient documentation

## 2013-12-27 DIAGNOSIS — N400 Enlarged prostate without lower urinary tract symptoms: Secondary | ICD-10-CM | POA: Insufficient documentation

## 2013-12-27 DIAGNOSIS — K219 Gastro-esophageal reflux disease without esophagitis: Secondary | ICD-10-CM | POA: Diagnosis present

## 2013-12-27 DIAGNOSIS — I2510 Atherosclerotic heart disease of native coronary artery without angina pectoris: Secondary | ICD-10-CM | POA: Diagnosis present

## 2013-12-27 LAB — CBC WITH DIFFERENTIAL/PLATELET
Basophils Absolute: 0 10*3/uL (ref 0.0–0.1)
Basophils Relative: 0 % (ref 0–1)
Eosinophils Absolute: 0.1 10*3/uL (ref 0.0–0.7)
Eosinophils Relative: 2 % (ref 0–5)
HCT: 37.9 % — ABNORMAL LOW (ref 39.0–52.0)
Hemoglobin: 12.9 g/dL — ABNORMAL LOW (ref 13.0–17.0)
Lymphocytes Relative: 25 % (ref 12–46)
Lymphs Abs: 1.7 10*3/uL (ref 0.7–4.0)
MCH: 29.3 pg (ref 26.0–34.0)
MCHC: 34 g/dL (ref 30.0–36.0)
MCV: 86.1 fL (ref 78.0–100.0)
Monocytes Absolute: 0.7 10*3/uL (ref 0.1–1.0)
Monocytes Relative: 10 % (ref 3–12)
Neutro Abs: 4.4 10*3/uL (ref 1.7–7.7)
Neutrophils Relative %: 63 % (ref 43–77)
Platelets: 215 10*3/uL (ref 150–400)
RBC: 4.4 MIL/uL (ref 4.22–5.81)
RDW: 13.9 % (ref 11.5–15.5)
WBC: 6.9 10*3/uL (ref 4.0–10.5)

## 2013-12-27 LAB — COMPREHENSIVE METABOLIC PANEL
ALT: 18 U/L (ref 0–53)
AST: 17 U/L (ref 0–37)
Albumin: 3.4 g/dL — ABNORMAL LOW (ref 3.5–5.2)
Alkaline Phosphatase: 84 U/L (ref 39–117)
BUN: 16 mg/dL (ref 6–23)
CO2: 24 mEq/L (ref 19–32)
Calcium: 8.4 mg/dL (ref 8.4–10.5)
Chloride: 103 mEq/L (ref 96–112)
Creatinine, Ser: 0.97 mg/dL (ref 0.50–1.35)
GFR calc Af Amer: 86 mL/min — ABNORMAL LOW (ref >90)
GFR calc non Af Amer: 74 mL/min — ABNORMAL LOW (ref >90)
Glucose, Bld: 94 mg/dL (ref 70–99)
Potassium: 3.9 mEq/L (ref 3.7–5.3)
Sodium: 141 mEq/L (ref 137–147)
Total Bilirubin: 0.5 mg/dL (ref 0.3–1.2)
Total Protein: 6.1 g/dL (ref 6.0–8.3)

## 2013-12-27 LAB — PRO B NATRIURETIC PEPTIDE: Pro B Natriuretic peptide (BNP): 262.1 pg/mL (ref 0–450)

## 2013-12-27 LAB — TROPONIN I
Troponin I: <0.3 ng/mL (ref <0.30)
Troponin I: <0.3 ng/mL (ref <0.30)

## 2013-12-27 LAB — LIPASE, BLOOD: Lipase: 31 U/L (ref 11–59)

## 2013-12-27 NOTE — ED Notes (Signed)
Per EMS: pt from [facility] cards c/o left sided CP into epigastric area with some tightness and SOB; pt given 1 nitro with some relief and 324mg  ASA by EMS; pt denies pain at present but sts some tightness in epigastric area; pt sts wife had recent MI and is in hospital so increase stress; 18g L AC

## 2013-12-27 NOTE — ED Provider Notes (Signed)
CSN: [id number]     Arrival date & time 12/27/13  1202 History   First MD Initiated Contact with Patient 12/27/13 1207     Chief Complaint  Patient presents with  . Chest Pain     (Consider location/radiation/quality/duration/timing/severity/associated sxs/prior Treatment) HPI  Pt with hx CAD and MI presents with epigastric tightness and SOB that occurred with walking yesterday and was relieved with rest (3pm).  This morning he developed the same symptoms again while getting ready at his house this morning around 3am, this time it was not relieved until he was given a nitro by EMS on the way to the ED.  Pt went to the cardiology office this morning and this is who called the ambulance. Has had increased cough over the past few weeks (since the pollen became heavy).  Developed edema in both legs this morning.  Denies fevers, nausea, lightheadedness, dizziness, diaphoresis.  States he has been under a lot of stress and not sleeping well because his wife is hospitalized after having had an MI.  Has not been taking his lasix as directed.  Generally has been taking his other medications but did not take his atenolol this morning.    Past Medical History  Diagnosis Date  . Hypertension   . Hyperlipidemia   . CAD (coronary artery disease)     9 STEMI 2001, PCI, RCA, residual 100% circumflex  /   PCI for in-stent restenosis June, 2001  /  nuclear February, 2010, no ischemia  . Emphysema     Mild  . GERD (gastroesophageal reflux disease)   . Cervical disc disease     Status post neurosurgery  . BPH (benign prostatic hypertrophy)   . Paralyzed hemidiaphragm     Right  . Statin intolerance     Elevated CPK in the past  . Paralyzed hemidiaphragm     Chronic  . Carotid artery disease     Doppler, June, 2011, 0-39% bilateral  . Colon polyps   . Elevated CPK     Chronic mild CPK elevation  . Fatigue     Morning fatigue, August, 2011  . Ejection fraction     EF 55-65%, echo, 2009  . Low back  pain     February, 2013   Past Surgical History  Procedure Laterality Date  . Cervical disc surgery    . Breast surgery      right  . Skin cancer excision     History reviewed. No pertinent family history. History  Substance Use Topics  . Smoking status: Former Smoker -- 3.00 packs/day for 40 years    Types: Cigarettes    Quit date: 09/15/1975  . Smokeless tobacco: Not on file  . Alcohol Use: Not on file    Review of Systems  Constitutional: Negative for fever.  HENT: Negative for sore throat.   Respiratory: Positive for cough and shortness of breath.   Cardiovascular: Positive for chest pain.  Gastrointestinal: Positive for abdominal pain. Negative for nausea, vomiting and diarrhea.  Genitourinary: Negative for dysuria, urgency and frequency.  All other systems reviewed and are negative.     Allergies  Review of patient's allergies indicates no known allergies.  Home Medications   Prior to Admission medications   Medication Sig Start Date End Date Taking? Authorizing Provider  acetaminophen (TYLENOL) 500 MG tablet Take 500 mg by mouth as needed.     Yes Historical Provider, MD  albuterol (PROVENTIL HFA;VENTOLIN HFA) 108 (90 BASE) MCG/ACT inhaler Inhale into  the lungs every 6 (six) hours as needed for wheezing or shortness of breath.   Yes Historical Provider, MD  aspirin 81 MG tablet Take 1 tablet (81 mg total) by mouth daily. 10/24/12  Yes Carlena Bjornstad, MD  atenolol (TENORMIN) 50 MG tablet Take 50 mg by mouth daily.   Yes Historical Provider, MD  finasteride (PROSCAR) 5 MG tablet Take 1 tablet by mouth daily. 06/03/11  Yes Historical Provider, MD  furosemide (LASIX) 40 MG tablet Take 40 mg by mouth daily as needed for fluid.   Yes Historical Provider, MD  omeprazole (PRILOSEC) 20 MG capsule Take 1 capsule (20 mg total) by mouth 2 (two) times daily. 02/01/13  Yes Kathee Delton, MD  tiotropium (SPIRIVA HANDIHALER) 18 MCG inhalation capsule Place 1 capsule (18 mcg total)  into inhaler and inhale daily. 01/06/13 01/06/14 Yes Kathee Delton, MD   BP 131/72  Pulse 71  Temp(Src) 98 F (36.7 C) (Oral)  Resp 17  Ht 6\' 2"  (1.88 m)  Wt 205 lb (92.987 kg)  BMI 26.31 kg/m2  SpO2 97% Physical Exam  Nursing note and vitals reviewed. Constitutional: He appears well-developed and well-nourished. No distress.  HENT:  Head: Normocephalic and atraumatic.  Neck: Neck supple.  Cardiovascular: Normal rate and regular rhythm.   Pulmonary/Chest: Effort normal. No respiratory distress. He has no wheezes. He has rales.  Abdominal: Soft. He exhibits no distension and no mass. There is tenderness in the epigastric area. There is no rebound and no guarding.  Musculoskeletal: He exhibits edema (pitting edema bilateral extremities into shins).  Neurological: He is alert. He exhibits normal muscle tone.  Skin: He is not diaphoretic.    ED Course  Procedures (including critical care time) Labs Review Labs Reviewed  CBC WITH DIFFERENTIAL - Abnormal; Notable for the following:    Hemoglobin 12.9 (*)    HCT 37.9 (*)    All other components within normal limits  COMPREHENSIVE METABOLIC PANEL - Abnormal; Notable for the following:    Albumin 3.4 (*)    GFR calc non Af Amer 74 (*)    GFR calc Af Amer 86 (*)    All other components within normal limits  LIPASE, BLOOD  TROPONIN I  PRO B NATRIURETIC PEPTIDE    Imaging Review Dg Chest 2 View  12/27/2013   CLINICAL DATA:  CHEST PAIN  EXAM: CHEST  2 VIEW  COMPARISON:  DG CHEST 2 VIEW dated 01/06/2013  FINDINGS: The heart size and mediastinal contours are within normal limits. Both lungs are clear. Stable mild anterior wedging of T12. Otherwise the visualized skeletal structures are unremarkable.  IMPRESSION: No active cardiopulmonary disease.   Electronically Signed   By: Margaree Mackintosh M.D.   On: 12/27/2013 13:33     EKG Interpretation   Date/Time:  Wednesday December 27 2013 12:05:26 EDT Ventricular Rate:  73 PR Interval:   167 QRS Duration: 98 QT Interval:  405 QTC Calculation: 446 R Axis:   -43 Text Interpretation:  Sinus rhythm Ventricular premature complex Left axis  deviation Abnormal R-wave progression, early transition Confirmed by DELOS   MD, DOUGLAS ([id number]) on 12/27/2013 2:42:43 PM      2:31 PM Discussed pt with Trish.  Cardiology to see patient.    MDM   Final diagnoses:  Chest pain  Dyspnea    Pt with hx CAD, MI 2001, p/w CP/epigastric pain and SOB with exertion yesterday, improved with rest and similar symptoms this morning without exertion, relieved with nitro  several hours later.  Initial troponin is negative.  Labs/CXR otherwise unremarkable.  Cardiology to consult.  Dispo pending their recommendations.  Dr Wilson Singer made aware of the patient.       Clayton Bibles, PA-C 12/27/13 1558

## 2013-12-27 NOTE — ED Notes (Signed)
Patient transported to X-ray 

## 2013-12-27 NOTE — ED Notes (Signed)
Returned from xray

## 2013-12-27 NOTE — Consult Note (Signed)
Chief Complaint: Chest Pain  HPI: The patient is a 80 year old gentleman who has been followed by Dr. Ron Parker. He presented to the [facility] ER today for evaluation of chest pain. He has a history of coronary artery disease with myocardial infarction in 2001. He has undergone stenting of the right coronary artery as well as cutting balloon angioplasty for treatment of in-stent restenosis. He was noted to have an occluded left circumflex with collaterals. He recently underwent a Lexiscan NST in November 2014, which was negative for ischemia. LVEF was estimated at 62 % with normal wall motion. His last 2D echo was Feburary 2014. Trivial aortic regurgitation was noted. There was mild LVH. Normal systolic function (EF 37-10%) with normal wall motion. He has bilateral carotid artery disease. Last carotid doppler studies were 2 weeks ago and was stable, 40-59% bilaterally. He also has GERD and takes a daily PPI.  He presented to the [facility] ER today with a complaint of mid epigastric tightness, that occurred this am ~0600. It did not radiate. No other associated symptoms. No SOB, diaphoresis, n/v. The discomfort felt very similar to his GERD. He denied any chest pain symptoms with his MI in 2002; He stated he just felt "bad" at that time. He admits that he has been under a great deal of stress over the last week, as his wife was admitted for NSTEMI. He admits to not being fully compliant with his medicines over the last several days.  He is now currently pain free. His EKG is negative for any acute ischemic changes. Initial troponin is negative. CXR is unremarkable.  Past Medical History   Diagnosis  Date   .  Hypertension    .  Hyperlipidemia    .  CAD (coronary artery disease)      9 STEMI 2001, PCI, RCA, residual 100% circumflex / PCI for in-stent restenosis June, 2001 / nuclear February, 2010, no ischemia   .  Emphysema      Mild   .  GERD (gastroesophageal reflux disease)    .  Cervical disc disease      Status post  neurosurgery   .  BPH (benign prostatic hypertrophy)    .  Paralyzed hemidiaphragm      Right   .  Statin intolerance      Elevated CPK in the past   .  Paralyzed hemidiaphragm      Chronic   .  Carotid artery disease      Doppler, June, 2011, 0-39% bilateral   .  Colon polyps    .  Elevated CPK      Chronic mild CPK elevation   .  Fatigue      Morning fatigue, August, 2011   .  Ejection fraction      EF 55-65%, echo, 2009   .  Low back pain      February, 2013    Past Surgical History   Procedure  Laterality  Date   .  Cervical disc surgery     .  Breast surgery       right   .  Skin cancer excision      History reviewed. No pertinent family history.  Social History: reports that he quit smoking about 38 years ago. His smoking use included Cigarettes. He has a 120 pack-year smoking history. He does not have any smokeless tobacco history on file. His alcohol and drug histories are not on file.  Allergies: No Known Allergies   (Not in  a hospital admission)  Results for orders placed during the hospital encounter of 12/27/13 (from the past 48 hour(s))   CBC WITH DIFFERENTIAL Status: Abnormal    Collection Time    12/27/13 1:00 PM   Result  Value  Ref Range    WBC  6.9  4.0 - 10.5 K/uL    RBC  4.40  4.22 - 5.81 MIL/uL    Hemoglobin  12.9 (*)  13.0 - 17.0 g/dL    HCT  37.9 (*)  39.0 - 52.0 %    MCV  86.1  78.0 - 100.0 fL    MCH  29.3  26.0 - 34.0 pg    MCHC  34.0  30.0 - 36.0 g/dL    RDW  13.9  11.5 - 15.5 %    Platelets  215  150 - 400 K/uL    Neutrophils Relative %  63  43 - 77 %    Neutro Abs  4.4  1.7 - 7.7 K/uL    Lymphocytes Relative  25  12 - 46 %    Lymphs Abs  1.7  0.7 - 4.0 K/uL    Monocytes Relative  10  3 - 12 %    Monocytes Absolute  0.7  0.1 - 1.0 K/uL    Eosinophils Relative  2  0 - 5 %    Eosinophils Absolute  0.1  0.0 - 0.7 K/uL    Basophils Relative  0  0 - 1 %    Basophils Absolute  0.0  0.0 - 0.1 K/uL   COMPREHENSIVE METABOLIC PANEL Status:  Abnormal    Collection Time    12/27/13 1:00 PM   Result  Value  Ref Range    Sodium  141  137 - 147 mEq/L    Potassium  3.9  3.7 - 5.3 mEq/L    Chloride  103  96 - 112 mEq/L    CO2  24  19 - 32 mEq/L    Glucose, Bld  94  70 - 99 mg/dL    BUN  16  6 - 23 mg/dL    Creatinine, Ser  0.97  0.50 - 1.35 mg/dL    Calcium  8.4  8.4 - 10.5 mg/dL    Total Protein  6.1  6.0 - 8.3 g/dL    Albumin  3.4 (*)  3.5 - 5.2 g/dL    AST  17  0 - 37 U/L    ALT  18  0 - 53 U/L    Alkaline Phosphatase  84  39 - 117 U/L    Total Bilirubin  0.5  0.3 - 1.2 mg/dL    GFR calc non Af Amer  74 (*)  >90 mL/min    GFR calc Af Amer  86 (*)  >90 mL/min    Comment:  (NOTE)     The eGFR has been calculated using the CKD EPI equation.     This calculation has not been validated in all clinical situations.     eGFR's persistently <90 mL/min signify possible Chronic Kidney     Disease.   LIPASE, BLOOD Status: None    Collection Time    12/27/13 1:00 PM   Result  Value  Ref Range    Lipase  31  11 - 59 U/L   TROPONIN I Status: None    Collection Time    12/27/13 1:00 PM   Result  Value  Ref Range    Troponin I  <0.30  <  0.30 ng/mL    Comment:      Due to the release kinetics of cTnI,     a negative result within the first hours     of the onset of symptoms does not rule out     myocardial infarction with certainty.     If myocardial infarction is still suspected,     repeat the test at appropriate intervals.   PRO B NATRIURETIC PEPTIDE Status: None    Collection Time    12/27/13 1:00 PM   Result  Value  Ref Range    Pro B Natriuretic peptide (BNP)  262.1  0 - 450 pg/mL    Dg Chest 2 View  12/27/2013 CLINICAL DATA: CHEST PAIN EXAM: CHEST 2 VIEW COMPARISON: DG CHEST 2 VIEW dated 01/06/2013 FINDINGS: The heart size and mediastinal contours are within normal limits. Both lungs are clear. Stable mild anterior wedging of T12. Otherwise the visualized skeletal structures are unremarkable. IMPRESSION: No active  cardiopulmonary disease. Electronically Signed By: Margaree Mackintosh M.D. On: 12/27/2013 13:33   Review of Systems  Constitutional: Negative for malaise/fatigue.  Respiratory: Negative for shortness of breath.  Cardiovascular: Positive for chest pain (mid epigastric) and leg swelling. Negative for palpitations, orthopnea and PND.  Gastrointestinal: Negative for nausea and vomiting.  Neurological: Negative for dizziness and loss of consciousness.  All other systems reviewed and are negative.   Blood pressure 152/77, pulse 71, temperature 98 F (36.7 C), temperature source Oral, resp. rate 15, height $RemoveBe'6\' 2"'bGwlTLAaB$  (1.88 m), weight 205 lb (92.987 kg), SpO2 95.00%.  Physical Exam  Constitutional: He is oriented to person, place, and time. He appears well-developed and well-nourished. No distress.  Neck: No JVD present. Carotid bruit is present (bilateral).  Cardiovascular: Normal rate, regular rhythm and intact distal pulses. Exam reveals no gallop and no friction rub.  No murmur heard.  Respiratory: Effort normal. No respiratory distress. He has no wheezes. He has rales (mild LLL that clear with cough, c/w atelectasis).  GI: Soft. Bowel sounds are normal. He exhibits no distension and no mass. There is no tenderness.  Musculoskeletal: He exhibits edema (trace bilateral LEE).  Neurological: He is alert and oriented to person, place, and time.  Skin: Skin is warm and dry. He is not diaphoretic.  Psychiatric: He has a normal mood and affect. His behavior is normal.   Assessment/Plan  Principal Problem:  Chest pain with low risk for cardiac etiology: symptoms appear more consistent with GERD. EKG w/o ischemic changes and initial troponin negative. He had a normal Lexiscan NST less than 6 months ago w/ normal LVEF and normal wall motion. Recommend checking another troponin. He denies any recurrence in symptoms since ~6am. If second troponin is negative, then can discharge home from ED.  Active Problems:  CAD  (coronary artery disease): negative Lexican NST in Nov. 2014. Continue ASA and BB.  GERD (gastroesophageal reflux disease): continue omeprazole  Discussed case with Dr. Sallyanne Kuster. Ok to discharge home from ED if second troponin returns negative. Call and notify cardiology if positive.  Brittainy Simmons  12/27/2013, 4:10 PM   I have seen and examined the patient along with Lyda Jester , PA.  I have reviewed the chart, notes and new data.  I agree with PA's note.  Key new complaints: epigastric tightness is more reminiscent of his GERD than prior MI symptoms Key examination changes: normal cardiac exam except for faint carotid bruits Key new findings / data: ECG without acute repol abnormalities, first set of  cardiac enzymes is normal. Recent low risk nuclear perfusion study.  PLAN: If second set of enzymes is normal, DC home. Can take antacids or H2 blockers if needed, in addition to slower-acting PPI.  Sanda Klein, MD, [place] [phone] 12/27/2013, 4:26 PM

## 2013-12-27 NOTE — Discharge Instructions (Signed)
Chest Pain (Nonspecific) °It is often hard to give a specific diagnosis for the cause of chest pain. There is always a chance that your pain could be related to something serious, such as a heart attack or a blood clot in the lungs. You need to follow up with your caregiver for further evaluation. °CAUSES  °· Heartburn. °· Pneumonia or bronchitis. °· Anxiety or stress. °· Inflammation around your heart (pericarditis) or lung (pleuritis or pleurisy). °· A blood clot in the lung. °· A collapsed lung (pneumothorax). It can develop suddenly on its own (spontaneous pneumothorax) or from injury (trauma) to the chest. °· Shingles infection (herpes zoster virus). °The chest wall is composed of bones, muscles, and cartilage. Any of these can be the source of the pain. °· The bones can be bruised by injury. °· The muscles or cartilage can be strained by coughing or overwork. °· The cartilage can be affected by inflammation and become sore (costochondritis). °DIAGNOSIS  °Lab tests or other studies, such as X-rays, electrocardiography, stress testing, or cardiac imaging, may be needed to find the cause of your pain.  °TREATMENT  °· Treatment depends on what may be causing your chest pain. Treatment may include: °· Acid blockers for heartburn. °· Anti-inflammatory medicine. °· Pain medicine for inflammatory conditions. °· Antibiotics if an infection is present. °· You may be advised to change lifestyle habits. This includes stopping smoking and avoiding alcohol, caffeine, and chocolate. °· You may be advised to keep your head raised (elevated) when sleeping. This reduces the chance of acid going backward from your stomach into your esophagus. °· Most of the time, nonspecific chest pain will improve within 2 to 3 days with rest and mild pain medicine. °HOME CARE INSTRUCTIONS  °· If antibiotics were prescribed, take your antibiotics as directed. Finish them even if you start to feel better. °· For the next few days, avoid physical  activities that bring on chest pain. Continue physical activities as directed. °· Do not smoke. °· Avoid drinking alcohol. °· Only take over-the-counter or prescription medicine for pain, discomfort, or fever as directed by your caregiver. °· Follow your caregiver's suggestions for further testing if your chest pain does not go away. °· Keep any follow-up appointments you made. If you do not go to an appointment, you could develop lasting (chronic) problems with pain. If there is any problem keeping an appointment, you must call to reschedule. °SEEK MEDICAL CARE IF:  °· You think you are having problems from the medicine you are taking. Read your medicine instructions carefully. °· Your chest pain does not go away, even after treatment. °· You develop a rash with blisters on your chest. °SEEK IMMEDIATE MEDICAL CARE IF:  °· You have increased chest pain or pain that spreads to your arm, neck, jaw, back, or abdomen. °· You develop shortness of breath, an increasing cough, or you are coughing up blood. °· You have severe back or abdominal pain, feel nauseous, or vomit. °· You develop severe weakness, fainting, or chills. °· You have a fever. °THIS IS AN EMERGENCY. Do not wait to see if the pain will go away. Get medical help at once. Call your local emergency services (911 in [place] Do not drive yourself to the hospital. °MAKE SURE YOU:  °· Understand these instructions. °· Will watch your condition. °· Will get help right away if you are not doing well or get worse. °Document Released: 06/10/2005 Document Revised: 11/23/2011 Document Reviewed: 04/05/2008 °Exit[employer]® Patient Information ©2014 [employer]   [employer]. ° °

## 2013-12-27 NOTE — ED Notes (Signed)
Pt resting talking with family.  Pt denies any pain at this time.  Skin warm and dry color appropriate.

## 2013-12-27 NOTE — H&P (Deleted)
Chief Complaint: Chest Pain  HPI: The patient is a 80 year old gentleman who has been followed by Dr. Ron Parker.  He presented to the [facility] ER today for evaluation of chest pain. He has a history of coronary artery disease with myocardial infarction in 2001. He has undergone stenting of the right coronary artery as well as cutting balloon angioplasty for treatment of in-stent restenosis. He was noted to have an occluded left circumflex with collaterals.  He recently underwent a Lexiscan NST in November 2014, which was negative for ischemia. LVEF was estimated at 62 % with normal wall motion. His last 2D echo was Feburary 2014. Trivial aortic regurgitation was noted. There was mild LVH. Normal systolic function (EF 98-92%) with normal wall motion. He has bilateral carotid artery disease. Last carotid doppler studies were 2 weeks ago and was stable, 40-59% bilaterally. He also has GERD and takes a daily PPI.  He presented to the [facility] ER today with a complaint of mid epigastric tightness, that occurred this am ~0600. It did not radiate. No other associated symptoms. No SOB, diaphoresis, n/v. The discomfort felt very similar to his GERD. He denied any chest pain symptoms with his MI in 2002; He stated he just felt "bad" at that time. He admits that he has been under a great deal of stress over the last week, as his wife was admitted for NSTEMI. He admits to not being fully compliant with his medicines over the last several days.  He is now currently pain free. His EKG is negative for any acute ischemic changes. Initial troponin is negative. CXR is unremarkable.    Past Medical History  Diagnosis Date  . Hypertension   . Hyperlipidemia   . CAD (coronary artery disease)     9 STEMI 2001, PCI, RCA, residual 100% circumflex  /   PCI for in-stent restenosis June, 2001  /  nuclear February, 2010, no ischemia  . Emphysema     Mild  . GERD (gastroesophageal reflux disease)   . Cervical disc disease     Status post  neurosurgery  . BPH (benign prostatic hypertrophy)   . Paralyzed hemidiaphragm     Right  . Statin intolerance     Elevated CPK in the past  . Paralyzed hemidiaphragm     Chronic  . Carotid artery disease     Doppler, June, 2011, 0-39% bilateral  . Colon polyps   . Elevated CPK     Chronic mild CPK elevation  . Fatigue     Morning fatigue, August, 2011  . Ejection fraction     EF 55-65%, echo, 2009  . Low back pain     February, 2013    Past Surgical History  Procedure Laterality Date  . Cervical disc surgery    . Breast surgery      right  . Skin cancer excision      History reviewed. No pertinent family history. Social History:  reports that he quit smoking about 38 years ago. His smoking use included Cigarettes. He has a 120 pack-year smoking history. He does not have any smokeless tobacco history on file. His alcohol and drug histories are not on file.  Allergies: No Known Allergies   (Not in a hospital admission)  Results for orders placed during the hospital encounter of 12/27/13 (from the past 48 hour(s))  CBC WITH DIFFERENTIAL     Status: Abnormal   Collection Time    12/27/13  1:00 PM  Result Value Ref Range   WBC 6.9  4.0 - 10.5 K/uL   RBC 4.40  4.22 - 5.81 MIL/uL   Hemoglobin 12.9 (*) 13.0 - 17.0 g/dL   HCT 83.6 (*) 62.9 - 47.6 %   MCV 86.1  78.0 - 100.0 fL   MCH 29.3  26.0 - 34.0 pg   MCHC 34.0  30.0 - 36.0 g/dL   RDW 54.6  50.3 - 54.6 %   Platelets 215  150 - 400 K/uL   Neutrophils Relative % 63  43 - 77 %   Neutro Abs 4.4  1.7 - 7.7 K/uL   Lymphocytes Relative 25  12 - 46 %   Lymphs Abs 1.7  0.7 - 4.0 K/uL   Monocytes Relative 10  3 - 12 %   Monocytes Absolute 0.7  0.1 - 1.0 K/uL   Eosinophils Relative 2  0 - 5 %   Eosinophils Absolute 0.1  0.0 - 0.7 K/uL   Basophils Relative 0  0 - 1 %   Basophils Absolute 0.0  0.0 - 0.1 K/uL  COMPREHENSIVE METABOLIC PANEL     Status: Abnormal   Collection Time    12/27/13  1:00 PM      Result Value  Ref Range   Sodium 141  137 - 147 mEq/L   Potassium 3.9  3.7 - 5.3 mEq/L   Chloride 103  96 - 112 mEq/L   CO2 24  19 - 32 mEq/L   Glucose, Bld 94  70 - 99 mg/dL   BUN 16  6 - 23 mg/dL   Creatinine, Ser 5.68  0.50 - 1.35 mg/dL   Calcium 8.4  8.4 - 12.7 mg/dL   Total Protein 6.1  6.0 - 8.3 g/dL   Albumin 3.4 (*) 3.5 - 5.2 g/dL   AST 17  0 - 37 U/L   ALT 18  0 - 53 U/L   Alkaline Phosphatase 84  39 - 117 U/L   Total Bilirubin 0.5  0.3 - 1.2 mg/dL   GFR calc non Af Amer 74 (*) >90 mL/min   GFR calc Af Amer 86 (*) >90 mL/min   Comment: (NOTE)     The eGFR has been calculated using the CKD EPI equation.     This calculation has not been validated in all clinical situations.     eGFR's persistently <90 mL/min signify possible Chronic Kidney     Disease.  LIPASE, BLOOD     Status: None   Collection Time    12/27/13  1:00 PM      Result Value Ref Range   Lipase 31  11 - 59 U/L  TROPONIN I     Status: None   Collection Time    12/27/13  1:00 PM      Result Value Ref Range   Troponin I <0.30  <0.30 ng/mL   Comment:            Due to the release kinetics of cTnI,     a negative result within the first hours     of the onset of symptoms does not rule out     myocardial infarction with certainty.     If myocardial infarction is still suspected,     repeat the test at appropriate intervals.  PRO B NATRIURETIC PEPTIDE     Status: None   Collection Time    12/27/13  1:00 PM      Result Value Ref Range   Pro  B Natriuretic peptide (BNP) 262.1  0 - 450 pg/mL   Dg Chest 2 View  12/27/2013   CLINICAL DATA:  CHEST PAIN  EXAM: CHEST  2 VIEW  COMPARISON:  DG CHEST 2 VIEW dated 01/06/2013  FINDINGS: The heart size and mediastinal contours are within normal limits. Both lungs are clear. Stable mild anterior wedging of T12. Otherwise the visualized skeletal structures are unremarkable.  IMPRESSION: No active cardiopulmonary disease.   Electronically Signed   By: Margaree Mackintosh M.D.   On: 12/27/2013  13:33    Review of Systems  Constitutional: Negative for malaise/fatigue.  Respiratory: Negative for shortness of breath.   Cardiovascular: Positive for chest pain (mid epigastric) and leg swelling. Negative for palpitations, orthopnea and PND.  Gastrointestinal: Negative for nausea and vomiting.  Neurological: Negative for dizziness and loss of consciousness.  All other systems reviewed and are negative.   Blood pressure 152/77, pulse 71, temperature 98 F (36.7 C), temperature source Oral, resp. rate 15, height $RemoveBe'6\' 2"'gynUtXhAt$  (1.88 m), weight 205 lb (92.987 kg), SpO2 95.00%. Physical Exam  Constitutional: He is oriented to person, place, and time. He appears well-developed and well-nourished. No distress.  Neck: No JVD present. Carotid bruit is present (bilateral).  Cardiovascular: Normal rate, regular rhythm and intact distal pulses.  Exam reveals no gallop and no friction rub.   No murmur heard. Respiratory: Effort normal. No respiratory distress. He has no wheezes. He has rales (mild LLL that clear with cough, c/w atelectasis).  GI: Soft. Bowel sounds are normal. He exhibits no distension and no mass. There is no tenderness.  Musculoskeletal: He exhibits edema (trace bilateral LEE).  Neurological: He is alert and oriented to person, place, and time.  Skin: Skin is warm and dry. He is not diaphoretic.  Psychiatric: He has a normal mood and affect. His behavior is normal.     Assessment/Plan Principal Problem:   Chest pain with low risk for cardiac etiology: symptoms appear more consistent with GERD. EKG w/o ischemic changes and initial troponin negative. He had a normal Lexiscan NST less than 6 months ago w/ normal LVEF and normal wall motion. Recommend checking another troponin. He denies any recurrence in symptoms since ~6am. If second troponin is negative, then can discharge home from ED.  Active Problems:   CAD (coronary artery disease): negative Lexican NST in Nov. 2014. Continue ASA  and BB.    GERD (gastroesophageal reflux disease): continue omeprazole   Discussed case with Dr. Sallyanne Kuster. Ok to discharge home from ED if second troponin returns negative. Call and notify cardiology if positive.   Brittainy Simmons 12/27/2013, 4:10 PM

## 2013-12-28 NOTE — ED Provider Notes (Signed)
Medical screening examination/treatment/procedure(s) were performed by non-physician practitioner and as supervising physician I was immediately available for consultation/collaboration.   EKG Interpretation   Date/Time:  Wednesday December 27 2013 12:05:26 EDT Ventricular Rate:  73 PR Interval:  167 QRS Duration: 98 QT Interval:  405 QTC Calculation: 446 R Axis:   -43 Text Interpretation:  Sinus rhythm Ventricular premature complex Left axis  deviation Abnormal R-wave progression, early transition Confirmed by Beau Fanny   MD, DOUGLAS ([id number]) on 12/27/2013 2:42:43 PM       Veryl Speak, MD 12/28/13 [id number]

## 2014-01-24 ENCOUNTER — Ambulatory Visit (INDEPENDENT_AMBULATORY_CARE_PROVIDER_SITE_OTHER): Payer: Medicare Other | Admitting: Pulmonary Disease

## 2014-01-24 ENCOUNTER — Encounter: Payer: Self-pay | Admitting: Pulmonary Disease

## 2014-01-24 VITALS — BP 138/80 | HR 64 | Temp 98.2°F | Ht 74.0 in | Wt 203.8 lb

## 2014-01-24 DIAGNOSIS — J449 Chronic obstructive pulmonary disease, unspecified: Secondary | ICD-10-CM

## 2014-01-24 DIAGNOSIS — R0609 Other forms of dyspnea: Secondary | ICD-10-CM

## 2014-01-24 DIAGNOSIS — R0989 Other specified symptoms and signs involving the circulatory and respiratory systems: Secondary | ICD-10-CM

## 2014-01-24 MED ORDER — FLUTICASONE-SALMETEROL 250-50 MCG/DOSE IN AEPB: 1.0000 | INHALATION_SPRAY | Freq: Two times a day (BID) | RESPIRATORY_TRACT | Status: DC

## 2014-01-24 MED ORDER — TIOTROPIUM BROMIDE MONOHYDRATE 18 MCG IN CAPS: 18.0000 ug | ORAL_CAPSULE | Freq: Every day | RESPIRATORY_TRACT | Status: DC

## 2014-01-24 NOTE — Progress Notes (Signed)
   Subjective:    Patient ID: Tim [name], male    DOB: 1929/09/05, 80 y.o.   MRN: [id number]  HPI Patient comes in today for followup of his known COPD, and also dyspnea on exertion related to a paralyzed hemidiaphragm. Overall, he is near his usual baseline, and has seen both good and bad days. Unfortunately, he is unable to afford his medication so that he can use every day. Therefore, rations his meds as needed.  He denies any acute exacerbation or pulmonary infection, but continues to have a lot of breathing issues and cough with white mucus first thing in the mornings upon arising. It takes him a few hours to clear this before his breathing gets back to baseline.   Review of Systems  Constitutional: Negative for fever and unexpected weight change.  HENT: Negative for congestion, dental problem, ear pain, nosebleeds, postnasal drip, rhinorrhea, sinus pressure, sneezing, sore throat and trouble swallowing.   Eyes: Negative for redness and itching.  Respiratory: Positive for cough and shortness of breath. Negative for chest tightness and wheezing.   Cardiovascular: Negative for palpitations and leg swelling.  Gastrointestinal: Negative for nausea and vomiting.  Genitourinary: Negative for dysuria.  Musculoskeletal: Negative for joint swelling.  Skin: Negative for rash.  Neurological: Negative for headaches.  Hematological: Does not bruise/bleed easily.  Psychiatric/Behavioral: Negative for dysphoric mood. The patient is not nervous/anxious.        Objective:   Physical Exam Well-developed male in no acute distress Nose without purulence or discharge noted Neck without lymphadenopathy or thyromegaly Chest with mildly decreased breath sounds, no active wheezing Cardiac exam with regular rate and rhythm Lower extremities with no significant edema, no cyanosis Alert and oriented, moves all 4 extremities.       Assessment & Plan:

## 2014-01-24 NOTE — Patient Instructions (Signed)
Stay on advair and spiriva for now, and will give you paperwork for the patient assistance program.  Will also send prescription for your meds. Will arrange for a nebulizer machine with albuterol liquid every am first thing.  Can also use every 6 hrs if having a bad day. Will get you form for a handicap sticker. Stay as active as possible. followup with me again in 33mos, but let me know if you have issues with your medications.

## 2014-01-24 NOTE — Addendum Note (Signed)
Addended by: Lilli Few on: 01/24/2014 03:29 PM   Modules accepted: Orders

## 2014-01-24 NOTE — Assessment & Plan Note (Signed)
The patient appears to be at his usual baseline, but has both good and bad days. He is unable to stay on a maintenance bronchodilator regimen consistently because of expense. Will continue him on his current meds, and see if we can get him enrolled in the patient assistance program. Because of his issues first thing in the morning, will try albuterol nebulizers. I've also stressed to him the importance of some type of conditioning program at the

## 2014-01-29 ENCOUNTER — Telehealth: Payer: Self-pay | Admitting: Pulmonary Disease

## 2014-01-29 MED ORDER — FLUTICASONE-SALMETEROL 250-50 MCG/DOSE IN AEPB: 1.0000 | INHALATION_SPRAY | Freq: Two times a day (BID) | RESPIRATORY_TRACT | Status: DC

## 2014-01-29 MED ORDER — TIOTROPIUM BROMIDE MONOHYDRATE 18 MCG IN CAPS: 18.0000 ug | ORAL_CAPSULE | Freq: Every day | RESPIRATORY_TRACT | Status: DC

## 2014-01-29 NOTE — Telephone Encounter (Signed)
Forms completed and rx printed and placed on [facility] desk to sign. I will fax forms once completed.[place] Bing, CMA

## 2014-01-30 NOTE — Telephone Encounter (Signed)
Rx signed and all forms faxed to [place] [facility] [place] for Spiriva and advair. [place] Bing, CMA

## 2014-02-01 ENCOUNTER — Ambulatory Visit: Payer: Medicare Other | Admitting: Pulmonary Disease

## 2014-02-05 ENCOUNTER — Other Ambulatory Visit: Payer: Self-pay | Admitting: Cardiology

## 2014-03-16 ENCOUNTER — Other Ambulatory Visit: Payer: Self-pay | Admitting: Cardiovascular Disease

## 2014-04-18 ENCOUNTER — Ambulatory Visit (INDEPENDENT_AMBULATORY_CARE_PROVIDER_SITE_OTHER): Payer: Medicare Other | Admitting: Cardiology

## 2014-04-18 ENCOUNTER — Encounter: Payer: Self-pay | Admitting: Cardiology

## 2014-04-18 VITALS — BP 122/70 | HR 60 | Ht 74.0 in | Wt 199.0 lb

## 2014-04-18 DIAGNOSIS — I2510 Atherosclerotic heart disease of native coronary artery without angina pectoris: Secondary | ICD-10-CM

## 2014-04-18 DIAGNOSIS — I739 Peripheral vascular disease, unspecified: Secondary | ICD-10-CM

## 2014-04-18 DIAGNOSIS — I10 Essential (primary) hypertension: Secondary | ICD-10-CM

## 2014-04-18 DIAGNOSIS — I779 Disorder of arteries and arterioles, unspecified: Secondary | ICD-10-CM

## 2014-04-18 NOTE — Assessment & Plan Note (Signed)
Blood pressures control. No change in therapy. 

## 2014-04-18 NOTE — Assessment & Plan Note (Signed)
Coronary disease is stable.  No further workup. 

## 2014-04-18 NOTE — Patient Instructions (Signed)
Your physician recommends that you continue on your current medications as directed. Please refer to the Current Medication list given to you today.  Your physician wants you to follow-up in: 6 months. You will receive a reminder letter in the mail two months in advance. If you don't receive a letter, please call our office to schedule the follow-up appointment.  

## 2014-04-18 NOTE — Assessment & Plan Note (Signed)
Carotid disease is being followed very carefully. No further testing at this time.

## 2014-04-18 NOTE — Progress Notes (Signed)
Patient ID: Tim [name], male   DOB: [birth year], 80 y.o.   MRN: [id number]    HPI   Patient is seen today to follow his overall cardiac status. I had seen him in April, 2015. He was stable. Shortly after that he was seen in the emergency room. He came to our office and was complaining of some chest discomfort and was taken to the emergency room. Full consultation was done and it was felt that he was stable. His symptoms were most likely from GERD. He's been doing well since then.  No Known Allergies  Current Outpatient Prescriptions  Medication Sig Dispense Refill  . acetaminophen (TYLENOL) 500 MG tablet Take 500 mg by mouth as needed.        [name] albuterol (PROVENTIL HFA;VENTOLIN HFA) 108 (90 BASE) MCG/ACT inhaler Inhale into the lungs every 6 (six) hours as needed for wheezing or shortness of breath.      [name] aspirin 81 MG tablet Take 1 tablet (81 mg total) by mouth daily.  30 tablet  6  . atenolol (TENORMIN) 50 MG tablet Take 50 mg by mouth daily.      . finasteride (PROSCAR) 5 MG tablet Take 1 tablet by mouth daily.      . fluticasone (FLONASE) 50 MCG/ACT nasal spray Place 2 sprays into both nostrils daily as needed for allergies or rhinitis.      . Fluticasone-Salmeterol (ADVAIR) 250-50 MCG/DOSE AEPB Inhale 1 puff into the lungs 2 (two) times daily.  180 each  3  . furosemide (LASIX) 40 MG tablet Take 40 mg by mouth daily as needed for fluid.      [name] omeprazole (PRILOSEC) 20 MG capsule Take 1 capsule (20 mg total) by mouth 2 (two) times daily.  60 capsule  0  . tiotropium (SPIRIVA HANDIHALER) 18 MCG inhalation capsule Place 1 capsule (18 mcg total) into inhaler and inhale daily.  90 capsule  3   No current facility-administered medications for this visit.    History   Social History  . Marital Status: Married    Spouse Name: N/A    Number of Children: N/A  . Years of Education: N/A   Occupational History  . retired Games developer    Social History Main Topics  . Smoking status:  Former Smoker -- 3.00 packs/day for 40 years    Types: Cigarettes    Quit date: 09/15/1975  . Smokeless tobacco: Not on file  . Alcohol Use: Not on file  . Drug Use: Not on file  . Sexual Activity: Not on file   Other Topics Concern  . Not on file   Social History Narrative  . No narrative on file    No family history on file.  Past Medical History  Diagnosis Date  . Hypertension   . Hyperlipidemia   . CAD (coronary artery disease)     9 STEMI 2001, PCI, RCA, residual 100% circumflex  /   PCI for in-stent restenosis June, 2001  /  nuclear February, 2010, no ischemia  . Emphysema     Mild  . GERD (gastroesophageal reflux disease)   . Cervical disc disease     Status post neurosurgery  . BPH (benign prostatic hypertrophy)   . Paralyzed hemidiaphragm     Right  . Statin intolerance     Elevated CPK in the past  . Paralyzed hemidiaphragm     Chronic  . Carotid artery disease     Doppler, June, 2011, 0-39% bilateral  .  Colon polyps   . Elevated CPK     Chronic mild CPK elevation  . Fatigue     Morning fatigue, August, 2011  . Ejection fraction     EF 55-65%, echo, 2009  . Low back pain     February, 2013    Past Surgical History  Procedure Laterality Date  . Cervical disc surgery    . Breast surgery      right  . Skin cancer excision      Patient Active Problem List   Diagnosis Date Noted  . Chest pain with low risk for cardiac etiology 12/27/2013  . Chronic rhinitis 02/01/2013  . Low back pain   . CAD (coronary artery disease)   . GERD (gastroesophageal reflux disease)   . Cervical disc disease   . BPH (benign prostatic hypertrophy)   . Paralyzed hemidiaphragm   . Statin intolerance   . Carotid artery disease   . Elevated CPK   . Fatigue   . Ejection fraction   . LEG CRAMPS 11/21/2010  . Fluid overload 05/29/2009  . HYPERLIPIDEMIA-MIXED 11/01/2008  . HYPERTENSION, BENIGN 11/01/2008  . COPD (chronic obstructive pulmonary disease) 07/14/2007  .  DYSPNEA ON EXERTION 07/14/2007    ROS    Patient denies fever, chills, headache, sweats, rash, change in vision, change in hearing, chest pain, cough, nausea vomiting, urinary symptoms. All other systems are reviewed and are negative.  PHYSICAL EXAM   Patient is oriented to person time and place. Affect is normal. He is here with his daughter. Head is atraumatic. Sclera and conjunctiva are normal. There is no jugulovenous distention. Lungs are clear. Respiratory effort is nonlabored. Cardiac exam reveals S1 and S2. There no clicks or significant murmurs. The abdomen is soft. Is no peripheral edema.  Filed Vitals:   04/18/14 1612  BP: 122/70  Pulse: 60  Height: 6\' 2"  (1.88 m)  Weight: 199 lb (90.266 kg)     ASSESSMENT & PLAN

## 2014-05-06 ENCOUNTER — Other Ambulatory Visit: Payer: Self-pay | Admitting: Cardiology

## 2014-05-25 ENCOUNTER — Other Ambulatory Visit: Payer: Self-pay | Admitting: Cardiovascular Disease

## 2014-07-05 ENCOUNTER — Other Ambulatory Visit: Payer: Self-pay | Admitting: Pulmonary Disease

## 2014-07-27 ENCOUNTER — Encounter: Payer: Self-pay | Admitting: Pulmonary Disease

## 2014-07-27 ENCOUNTER — Ambulatory Visit (INDEPENDENT_AMBULATORY_CARE_PROVIDER_SITE_OTHER)
Admission: RE | Admit: 2014-07-27 | Discharge: 2014-07-27 | Disposition: A | Discharge status: Home / Self Care | Payer: Medicare Other | Source: Ambulatory Visit | Attending: Pulmonary Disease | Admitting: Pulmonary Disease

## 2014-07-27 ENCOUNTER — Ambulatory Visit (INDEPENDENT_AMBULATORY_CARE_PROVIDER_SITE_OTHER): Payer: Medicare Other | Admitting: Pulmonary Disease

## 2014-07-27 VITALS — BP 144/82 | HR 64 | Temp 98.0°F | Ht 74.0 in | Wt 202.0 lb

## 2014-07-27 DIAGNOSIS — J986 Disorders of diaphragm: Secondary | ICD-10-CM

## 2014-07-27 DIAGNOSIS — R0609 Other forms of dyspnea: Secondary | ICD-10-CM

## 2014-07-27 DIAGNOSIS — J438 Other emphysema: Secondary | ICD-10-CM

## 2014-07-27 DIAGNOSIS — R0600 Dyspnea, unspecified: Secondary | ICD-10-CM

## 2014-07-27 MED ORDER — ALBUTEROL SULFATE HFA 108 (90 BASE) MCG/ACT IN AERS: 2.0000 | INHALATION_SPRAY | Freq: Four times a day (QID) | RESPIRATORY_TRACT | Status: DC | PRN

## 2014-07-27 NOTE — Assessment & Plan Note (Signed)
I have reminded the patient his paralyzed diaphragm also contributes to his shortness of breath.

## 2014-07-27 NOTE — Assessment & Plan Note (Signed)
The patient has known moderate to severe COPD, and is on a very good bronchodilator regimen for this. I think we are doing all we can for his obstructive lung disease, and the wheezing that he is describing is classic for upper airway pseudo wheezing. We have checked his oxygen level with ambulation today, and he maintains very good saturations throughout. I stressed to him the importance of staying as active as possible.

## 2014-07-27 NOTE — Progress Notes (Signed)
   Subjective:    Patient ID: Tim [name], male    DOB: [birth year], 80 y.o.   MRN: [id number]  HPI The patient comes in today for follow-up of his known COPD, as well as dyspnea on exertion related to a paralyzed hemidiaphragm as well. Overall, he is maintaining a fairly stable baseline, but most recently has been complaining of "wheezing" at night that is audible in nature. I have explained to him the difference between true wheezing and upper airway pseudo wheezing. He continues to have dyspnea on exertion which is multifactorial, and related to his musculoskeletal issues, his known moderate to severe COPD, and also his paralyzed hemidiaphragm. He is not having any upper airway reflux, but does continue to have postnasal drip. He is staying on his medications, but unfortunately was not able to qualify for the patient assistance programs.   Review of Systems  Constitutional: Negative for fever and unexpected weight change.  HENT: Negative for congestion, dental problem, ear pain, nosebleeds, postnasal drip, rhinorrhea, sinus pressure, sneezing, sore throat and trouble swallowing.   Eyes: Negative for redness and itching.  Respiratory: Positive for cough, chest tightness and wheezing. Negative for shortness of breath.   Cardiovascular: Positive for leg swelling. Negative for palpitations.  Gastrointestinal: Negative for nausea and vomiting.  Genitourinary: Negative for dysuria.  Musculoskeletal: Negative for joint swelling.  Skin: Negative for rash.  Neurological: Negative for headaches.  Hematological: Does not bruise/bleed easily.  Psychiatric/Behavioral: Negative for dysphoric mood. The patient is not nervous/anxious.        Objective:   Physical Exam Well-developed male in no acute distress Nose without purulence or discharge noted Neck without lymphadenopathy or thyromegaly Chest with mildly decreased breath sounds, no wheezing Cardiac exam with regular rate and rhythm Lower  extremities with mild ankle edema, no cyanosis Alert and oriented, moves all 4 extremities.       Assessment & Plan:

## 2014-07-27 NOTE — Addendum Note (Signed)
Addended by: Inge Rise on: 07/27/2014 09:53 AM   Modules accepted: Orders

## 2014-07-27 NOTE — Patient Instructions (Addendum)
Stay on advair am and pm Stay on spiriva, but will give you samples of the new device.  Take 2 inhalations each am for the next 4 weeks.  Then let me know which type of spiriva you like the best. Will check a chest xray today, and will send report to your primary doctor.  The wheezing you are describing is coming from your upper airway.  Postnasal drip is a common cause of this.  Don't forget that you can take an antihistamine at bedtime such as zyrtec 10mg  over the counter for this.   If you continue to have a lot of "wheezing", may need to have ENT check your voice box to make sure everything ok.  Continue to stay as active as possible to build conditioning.  Your oxygen level today is great with fast walking.  followup with me again in 34mos, but call if you are not doing well.

## 2014-08-04 ENCOUNTER — Other Ambulatory Visit: Payer: Self-pay | Admitting: Cardiology

## 2014-10-12 ENCOUNTER — Other Ambulatory Visit: Payer: Self-pay | Admitting: Cardiology

## 2014-10-15 ENCOUNTER — Ambulatory Visit (INDEPENDENT_AMBULATORY_CARE_PROVIDER_SITE_OTHER): Payer: Medicare Other | Admitting: Cardiology

## 2014-10-15 ENCOUNTER — Encounter: Payer: Self-pay | Admitting: Cardiology

## 2014-10-15 VITALS — BP 144/76 | HR 91 | Ht 74.0 in | Wt 203.4 lb

## 2014-10-15 DIAGNOSIS — I739 Peripheral vascular disease, unspecified: Secondary | ICD-10-CM

## 2014-10-15 DIAGNOSIS — I779 Disorder of arteries and arterioles, unspecified: Secondary | ICD-10-CM

## 2014-10-15 DIAGNOSIS — Z889 Allergy status to unspecified drugs, medicaments and biological substances status: Secondary | ICD-10-CM

## 2014-10-15 DIAGNOSIS — Z789 Other specified health status: Secondary | ICD-10-CM

## 2014-10-15 DIAGNOSIS — I2510 Atherosclerotic heart disease of native coronary artery without angina pectoris: Secondary | ICD-10-CM

## 2014-10-15 NOTE — Assessment & Plan Note (Signed)
Unfortunately he cannot receive a statin. He has chronically elevated CPK and has marked symptoms if we use a statin.

## 2014-10-15 NOTE — Progress Notes (Signed)
HPI Patient is seen today to follow-up his overall cardiac status. I saw him last August, 2015. He has been stable. He has severe COPD and is followed by the pulmonary group. Most recently he's not had any significant cardiac symptoms.  No Known Allergies  Current Outpatient Prescriptions  Medication Sig Dispense Refill  . acetaminophen (TYLENOL) 500 MG tablet Take 500 mg by mouth as needed.      [name] albuterol (PROVENTIL HFA;VENTOLIN HFA) 108 (90 BASE) MCG/ACT inhaler Inhale 2 puffs into the lungs every 6 (six) hours as needed for wheezing or shortness of breath. 1 Inhaler 6  . aspirin 81 MG tablet Take 1 tablet (81 mg total) by mouth daily. 30 tablet 6  . atenolol (TENORMIN) 50 MG tablet Take 50 mg by mouth daily.    [name] azelastine (ASTELIN) 0.1 % nasal spray daily as needed. For congestion    . finasteride (PROSCAR) 5 MG tablet Take 1 tablet by mouth daily.    . Fluticasone-Salmeterol (ADVAIR) 250-50 MCG/DOSE AEPB Inhale 1 puff into the lungs 2 (two) times daily. 180 each 3  . furosemide (LASIX) 40 MG tablet TAKE 1 TABLET DAILY 90 tablet 1  . omeprazole (PRILOSEC) 20 MG capsule TAKE 1 CAPSULE TWICE A DAY 180 capsule 2  . tamsulosin (FLOMAX) 0.4 MG CAPS capsule Take 0.4 mg by mouth daily as needed. For urinary symptoms    . tiotropium (SPIRIVA HANDIHALER) 18 MCG inhalation capsule Place 1 capsule (18 mcg total) into inhaler and inhale daily. 90 capsule 3   No current facility-administered medications for this visit.    History   Social History  . Marital Status: Married    Spouse Name: N/A    Number of Children: N/A  . Years of Education: N/A   Occupational History  . retired Games developer    Social History Main Topics  . Smoking status: Former Smoker -- 3.00 packs/day for 40 years    Types: Cigarettes    Quit date: 09/15/1975  . Smokeless tobacco: Not on file  . Alcohol Use: Not on file  . Drug Use: Not on file  . Sexual Activity: Not on file   Other Topics Concern  . Not  on file   Social History Narrative    Family history Patient denies any significant family history of coronary disease.  Past Medical History  Diagnosis Date  . Hypertension   . Hyperlipidemia   . CAD (coronary artery disease)     9 STEMI 2001, PCI, RCA, residual 100% circumflex  /   PCI for in-stent restenosis June, 2001  /  nuclear February, 2010, no ischemia  . Emphysema     Mild  . GERD (gastroesophageal reflux disease)   . Cervical disc disease     Status post neurosurgery  . BPH (benign prostatic hypertrophy)   . Paralyzed hemidiaphragm     Right  . Statin intolerance     Elevated CPK in the past  . Paralyzed hemidiaphragm     Chronic  . Carotid artery disease     Doppler, June, 2011, 0-39% bilateral  . Colon polyps   . Elevated CPK     Chronic mild CPK elevation  . Fatigue     Morning fatigue, August, 2011  . Ejection fraction     EF 55-65%, echo, 2009  . Low back pain     February, 2013    Past Surgical History  Procedure Laterality Date  . Cervical disc surgery    .  Breast surgery      right  . Skin cancer excision      Patient Active Problem List   Diagnosis Date Noted  . Chronic rhinitis 02/01/2013  . Low back pain   . CAD (coronary artery disease)   . GERD (gastroesophageal reflux disease)   . Cervical disc disease   . BPH (benign prostatic hypertrophy)   . Paralyzed hemidiaphragm   . Statin intolerance   . Carotid artery disease   . Elevated CPK   . Fatigue   . Ejection fraction   . LEG CRAMPS 11/21/2010  . Hyperlipidemia 11/01/2008  . HYPERTENSION, BENIGN 11/01/2008  . COPD (chronic obstructive pulmonary disease) 07/14/2007  . DOE (dyspnea on exertion) 07/14/2007    ROS  Patient denies fever, chills, headache, sweats, rash, change in vision, change in hearing, chest pain, cough, nausea or vomiting, urinary symptoms. All other systems are reviewed and are negative.  PHYSICAL EXAM Patient is here with his daughter-in-law. He is  oriented to person time and place. Affect is normal. He's having some arthritic symptoms. Other than this he feels quite well. Head is atraumatic. Sclera and conjunctiva are normal. There is no jugular venous distention. Lungs are clear. Respiratory effort is not labored. Cardiac exam reveals S1 and S2. The abdomen is soft. There is no peripheral edema.  Filed Vitals:   10/15/14 0817  BP: 144/76  Pulse: 91  Height: 6\' 2"  (1.88 m)  Weight: 203 lb 6.4 oz (92.262 kg)   EKG is done today and reviewed by me. There is no significant change.  ASSESSMENT & PLAN

## 2014-10-15 NOTE — Assessment & Plan Note (Signed)
Coronary disease is stable.  No further workup. 

## 2014-10-15 NOTE — Assessment & Plan Note (Signed)
This is being followed very carefully by our pulmonary team.

## 2014-10-15 NOTE — Assessment & Plan Note (Signed)
His carotid disease is being followed carefully. He should have a follow-up Doppler later this year.

## 2014-10-15 NOTE — Patient Instructions (Signed)
Your physician recommends that you continue on your current medications as directed. Please refer to the Current Medication list given to you today.  Your physician wants you to follow-up in: 6 months (in August). You will receive a reminder letter in the mail two months in advance. If you don't receive a letter, please call our office to schedule the follow-up appointment.

## 2014-11-22 ENCOUNTER — Telehealth: Payer: Self-pay | Admitting: Pulmonary Disease

## 2014-11-22 MED ORDER — OMEPRAZOLE 20 MG PO CPDR: 20.0000 mg | DELAYED_RELEASE_CAPSULE | Freq: Two times a day (BID) | ORAL | Status: DC

## 2014-11-22 NOTE — Telephone Encounter (Signed)
Rx has been sent in. Pt is aware. Nothing further was needed. 

## 2015-01-25 ENCOUNTER — Ambulatory Visit: Payer: Medicare Other | Admitting: Pulmonary Disease

## 2015-02-01 ENCOUNTER — Ambulatory Visit (INDEPENDENT_AMBULATORY_CARE_PROVIDER_SITE_OTHER): Payer: Medicare Other | Admitting: Pulmonary Disease

## 2015-02-01 ENCOUNTER — Encounter (INDEPENDENT_AMBULATORY_CARE_PROVIDER_SITE_OTHER): Payer: Self-pay

## 2015-02-01 ENCOUNTER — Encounter: Payer: Self-pay | Admitting: Pulmonary Disease

## 2015-02-01 VITALS — BP 142/72 | HR 67 | Temp 97.4°F | Ht 73.0 in | Wt 200.0 lb

## 2015-02-01 DIAGNOSIS — R0609 Other forms of dyspnea: Secondary | ICD-10-CM

## 2015-02-01 DIAGNOSIS — R0600 Dyspnea, unspecified: Secondary | ICD-10-CM

## 2015-02-01 DIAGNOSIS — J438 Other emphysema: Secondary | ICD-10-CM

## 2015-02-01 NOTE — Progress Notes (Signed)
   Subjective:    Patient ID: Tim [name], male    DOB: 1929-03-17, 81 y.o.   MRN: [id number]  HPI The patient comes in today for follow-up of his known COPD, and he also has a paralyzed hemidiaphragm by fluoroscopy. He has really done well since the last visit, with no chest infection or acute exacerbation. He tells me that he is only taking his maintenance medications on an as-needed basis, and is satisfied with this regimen. He denies any significant cough, congestion, or purulence.   Review of Systems  Constitutional: Negative for fever and unexpected weight change.  HENT: Negative for congestion, dental problem, ear pain, nosebleeds, postnasal drip, rhinorrhea, sinus pressure, sneezing, sore throat and trouble swallowing.   Eyes: Negative for redness and itching.  Respiratory: Positive for cough and shortness of breath. Negative for chest tightness and wheezing.   Cardiovascular: Negative for palpitations and leg swelling.  Gastrointestinal: Negative for nausea and vomiting.  Genitourinary: Negative for dysuria.  Musculoskeletal: Negative for joint swelling.  Skin: Negative for rash.  Neurological: Negative for headaches.  Hematological: Does not bruise/bleed easily.  Psychiatric/Behavioral: Negative for dysphoric mood. The patient is not nervous/anxious.        Objective:   Physical Exam Well-developed male in no acute distress Nose without purulence or discharge noted Neck without lymphadenopathy or thyromegaly Chest with decreased breath sounds, but no wheezes or crackles Cardiac exam with regular rate and rhythm Extremities without significant edema, no cyanosis Alert and oriented, moves all 4 extremities.       Assessment & Plan:

## 2015-02-01 NOTE — Assessment & Plan Note (Signed)
The patient continues to do well on his current regimen, however he is only using his maintenance inhalers as needed. We have had a long discussion about maintenance versus as needed medications, and I think we should simplify his regimen since he is not taking them on a consistent basis. I would like to start him on stiolto, and discontinue his Advair and Spiriva. At least if he uses this on an as needed basis, he will get the benefit of a very rapid onset of action in the stiolto. However, I have told him that if he has any worsening of his breathing or if he has an acute exacerbation, he will need to go back on a more consistent medications. Since I am leaving the practice, he wanted to primarily follow-up with his primary care physician. He has been very stable over the years, and I think this is okay. However, I would like to get him assigned to a physician here with a one year follow-up, to make sure things do not fall through the cracks.

## 2015-02-01 NOTE — Patient Instructions (Signed)
Stop spiriva and advair as a trial, and use stiolto 2 inhalations each am (ok to take as needed if you feel this works better for you) Continue to stay active  followup with Dr. Lake Bells in one year, but call in 2-3 weeks with how things are going with the new medication.  We can then send in a prescription for you.

## 2015-02-12 ENCOUNTER — Other Ambulatory Visit: Payer: Self-pay | Admitting: Cardiology

## 2015-02-19 ENCOUNTER — Telehealth: Payer: Self-pay | Admitting: Pulmonary Disease

## 2015-02-19 MED ORDER — TIOTROPIUM BROMIDE-OLODATEROL 2.5-2.5 MCG/ACT IN AERS: 2.0000 | INHALATION_SPRAY | RESPIRATORY_TRACT | Status: DC

## 2015-02-19 NOTE — Telephone Encounter (Signed)
Patient daughter aware that we will send Rx for Stiolto to pharmacy.  Pt daughter aware also of rec's for how to take medications while on Stiolto - stop Spiriva and Advair. Nothing further needed.  Patient Instructions      Stop spiriva and advair as a trial, and use stiolto 2 inhalations each am (ok to take as needed if you feel this works better for you)  Continue to stay active  followup with Dr. Lake Bells in one year, but call in 2-3 weeks with how things are going with the new medication. We can then send in a prescription for you.

## 2015-02-19 NOTE — Telephone Encounter (Signed)
lmtcb for Linda.  

## 2015-02-19 NOTE — Telephone Encounter (Signed)
[phone] calling back

## 2015-04-04 ENCOUNTER — Telehealth: Payer: Self-pay | Admitting: Pulmonary Disease

## 2015-04-04 NOTE — Telephone Encounter (Signed)
lmomtcb x1 

## 2015-04-05 MED ORDER — OMEPRAZOLE 20 MG PO CPDR: 20.0000 mg | DELAYED_RELEASE_CAPSULE | Freq: Two times a day (BID) | ORAL | Status: DC

## 2015-04-05 NOTE — Telephone Encounter (Signed)
Called and spoke with Sharyn Lull from [employer]. She said that the medication was expired and just needed a new Rx sent in. New RX was sent in for Omeprazole. Patient's daughter notified. Nothing further needed.

## 2015-04-05 NOTE — Telephone Encounter (Signed)
lmomtcb for [employer]

## 2015-04-05 NOTE — Telephone Encounter (Signed)
[phone], pt daughter cb [name]

## 2015-04-10 ENCOUNTER — Telehealth: Payer: Self-pay | Admitting: Pulmonary Disease

## 2015-04-10 NOTE — Telephone Encounter (Signed)
Pt currently takes Prilosec 20 mg take 1 po BID. Spoke with [name] [facility] for patient); she is aware that I will get message to BQ. There may be a chance that patient may have to get med OTC or have BQ change to a different PPI. BQ please advise. Thanks.

## 2015-04-11 NOTE — Telephone Encounter (Signed)
Spoke with pt Tim [name]- aware of rec's per BQ Vaughan [name] states that she would feel comfortable checking with pharmacy first to see what the copay would be for 30-day vs 90-day vs OTC.  Vaughan [name] will call back to let us know if Rx needs to be sent.  Will await call back

## 2015-04-11 NOTE — Telephone Encounter (Signed)
I'm OK with OTC, but if he needs me to call in an Rx we can change the presription for him

## 2015-04-15 NOTE — Telephone Encounter (Signed)
Spoke with pt's daughter [name], states that Omeprazole [facility] day [facility] is the cheapest for pt- will cost approx $36/90 day supply.   Pt has a quantity limit on his insurance of 1 tab/qd for this medication according to [place]. Pt was taking 20mg  pepcid bid, pt's daughter is wanting to know if pt would benefit from taking 1 40mg  tablet to get by with quantity limit and still receive the same dose of medication.  BQ please advise if you are ok with this med switch.  Thanks!

## 2015-04-15 NOTE — Telephone Encounter (Signed)
Tim [name] (daughter) needs to talk to nurse about this med.  Omeprazole is over $100 a month through the pharmacy.  Please call back at [phone]

## 2015-04-16 NOTE — Telephone Encounter (Signed)
omeprazole

## 2015-04-16 NOTE — Telephone Encounter (Signed)
lmomtcb x1 for linda

## 2015-04-16 NOTE — Telephone Encounter (Signed)
I believe that 40mg  daily is equivalent. The best way to tell is to take the OTC that way (2 20mg  tabs daily) for a week and let us know if he gets worse.  Then we can send the Rx for 40mg  daily for 90 days

## 2015-04-16 NOTE — Telephone Encounter (Signed)
Dr. Lake Bells just to clarify: do you want 40mg  of omeprazole or pepcid to be sent to pharmacy?  Thanks!

## 2015-04-17 MED ORDER — OMEPRAZOLE 40 MG PO CPDR: 40.0000 mg | DELAYED_RELEASE_CAPSULE | Freq: Every day | ORAL | Status: DC

## 2015-04-17 NOTE — Telephone Encounter (Signed)
[facility]  Pt's daughter aware.  Nothing further needed.

## 2015-05-01 ENCOUNTER — Telehealth: Payer: Self-pay | Admitting: *Deleted

## 2015-05-01 ENCOUNTER — Encounter: Payer: Self-pay | Admitting: *Deleted

## 2015-05-01 NOTE — Telephone Encounter (Signed)
Called patient to get Family History. Tim [name] answered the phone and provided me with information on his family and his current medications. CRM

## 2015-05-01 NOTE — Telephone Encounter (Signed)
Called patient to get family history, there was no answer. CRM

## 2015-05-10 ENCOUNTER — Ambulatory Visit (INDEPENDENT_AMBULATORY_CARE_PROVIDER_SITE_OTHER): Payer: Medicare Other | Admitting: Cardiology

## 2015-05-10 ENCOUNTER — Encounter: Payer: Self-pay | Admitting: Cardiology

## 2015-05-10 VITALS — BP 142/68 | HR 68 | Ht 73.0 in | Wt 203.0 lb

## 2015-05-10 DIAGNOSIS — I739 Peripheral vascular disease, unspecified: Secondary | ICD-10-CM

## 2015-05-10 DIAGNOSIS — I779 Disorder of arteries and arterioles, unspecified: Secondary | ICD-10-CM

## 2015-05-10 DIAGNOSIS — I2510 Atherosclerotic heart disease of native coronary artery without angina pectoris: Secondary | ICD-10-CM

## 2015-05-10 DIAGNOSIS — E785 Hyperlipidemia, unspecified: Secondary | ICD-10-CM

## 2015-05-10 NOTE — Assessment & Plan Note (Signed)
The patient had an MI and 2001. He had PCI to the RCA. There was follow-up PCI for in-stent restenosis. There was also history of a total circumflex in the past. Nuclear scan in November, 2014, revealed no scar or ischemia. EF is 62%. He is not having any symptoms. He is statin intolerant. No further workup at this time.

## 2015-05-10 NOTE — Assessment & Plan Note (Signed)
Patient is statin intolerant. He has had elevated CPK in the past. No change in therapy at this time.

## 2015-05-10 NOTE — Patient Instructions (Signed)
Medication Instructions:  Same-no changes  Labwork: None  Testing/Procedures: Your physician has requested that you have a carotid duplex. This test is an ultrasound of the carotid arteries in your neck. It looks at blood flow through these arteries that supply the brain with blood. Allow one hour for this exam. There are no restrictions or special instructions.   Follow-Up: Your physician wants you to follow-up in: 1 year. You will receive a reminder letter in the mail two months in advance. If you don't receive a letter, please call our office to schedule the follow-up appointment.

## 2015-05-10 NOTE — Assessment & Plan Note (Signed)
He has moderate carotid disease. His last Doppler was April, 2015. He needs a one-year follow-up and this will be scheduled.

## 2015-05-10 NOTE — Progress Notes (Signed)
Cardiology Office Note   Date:  05/10/2015   ID:  Tim [name], DOB Feb 08, 1929, MRN [id number]  PCP:  Raelene Bott, MD  Cardiologist:  Dola Argyle, MD   Chief Complaint  Patient presents with  . Appointment    Follow-up coronary disease      History of Present Illness: Tim [name] is a 81 y.o. male who presents today to follow up coronary disease. He is not having any chest pain. We did exercise testing in 2014 and there was no ischemia. He has significant COPD. He has been followed by Dr. Gwenette Greet in the pulmonary department over the years. His pulmonary status is stable. He is active and not having any significant symptoms.    Past Medical History  Diagnosis Date  . Hypertension   . Hyperlipidemia   . CAD (coronary artery disease)     9 STEMI 2001, PCI, RCA, residual 100% circumflex  /   PCI for in-stent restenosis June, 2001  /  nuclear February, 2010, no ischemia  . Emphysema     Mild  . GERD (gastroesophageal reflux disease)   . Cervical disc disease     Status post neurosurgery  . BPH (benign prostatic hypertrophy)   . Paralyzed hemidiaphragm     Right  . Statin intolerance     Elevated CPK in the past  . Paralyzed hemidiaphragm     Chronic  . Carotid artery disease     Doppler, June, 2011, 0-39% bilateral  . Colon polyps   . Elevated CPK     Chronic mild CPK elevation  . Fatigue     Morning fatigue, August, 2011  . Ejection fraction     EF 55-65%, echo, 2009  . Low back pain     February, 2013    Past Surgical History  Procedure Laterality Date  . Cervical disc surgery    . Breast surgery      right  . Skin cancer excision      Patient Active Problem List   Diagnosis Date Noted  . Chronic rhinitis 02/01/2013  . Low back pain   . CAD (coronary artery disease)   . GERD (gastroesophageal reflux disease)   . Cervical disc disease   . BPH (benign prostatic hypertrophy)   . Paralyzed hemidiaphragm   . Statin intolerance   . Carotid  artery disease   . Elevated CPK   . Fatigue   . Ejection fraction   . LEG CRAMPS 11/21/2010  . Hyperlipidemia 11/01/2008  . HYPERTENSION, BENIGN 11/01/2008  . COPD (chronic obstructive pulmonary disease) 07/14/2007  . DOE (dyspnea on exertion) 07/14/2007      Current Outpatient Prescriptions  Medication Sig Dispense Refill  . acetaminophen (TYLENOL) 500 MG tablet Take 500 mg by mouth as needed.      [name] albuterol (PROVENTIL HFA;VENTOLIN HFA) 108 (90 BASE) MCG/ACT inhaler Inhale 2 puffs into the lungs every 6 (six) hours as needed for wheezing or shortness of breath. 1 Inhaler 6  . aspirin 81 MG tablet Take 1 tablet (81 mg total) by mouth daily. 30 tablet 6  . atenolol (TENORMIN) 50 MG tablet Take 1 tablet (50 mg total) by mouth daily. 90 tablet 0  . azelastine (ASTELIN) 0.1 % nasal spray Place 2 sprays into both nostrils 2 (two) times daily. Use in each nostril as directed    . finasteride (PROSCAR) 5 MG tablet Take 1 tablet by mouth daily.    . furosemide (LASIX) 40 MG tablet TAKE 1  TABLET DAILY 90 tablet 1  . mupirocin ointment (BACTROBAN) 2 % Place 1 application into the nose 2 (two) times daily. Apply to the scalp    . omeprazole (PRILOSEC) 40 MG capsule Take 1 capsule (40 mg total) by mouth daily. 90 capsule 3  . tamsulosin (FLOMAX) 0.4 MG CAPS capsule Take 0.4 mg by mouth as needed. For urinary symptoms    . Tiotropium Bromide-Olodaterol (STIOLTO RESPIMAT) 2.5-2.5 MCG/ACT AERS Inhale 2 puffs into the lungs every morning. 1 Inhaler 6   No current facility-administered medications for this visit.    Allergies:   Statins    Social History:  The patient  reports that he quit smoking about 39 years ago. His smoking use included Cigarettes. He has a 120 pack-year smoking history. He does not have any smokeless tobacco history on file.   Family History:  The patient's family history includes Cancer in his brother; Diabetes in his mother and sister; Kidney disease in his brother; Other  in his father and sister.    ROS:  Please see the history of present illness.      Patient denies fever, chills, headache, sweats, rash, change in vision, change in hearing, chest pain, cough, nausea or vomiting, urinary symptoms. All other systems are reviewed and are negative.  PHYSICAL EXAM: VS:  BP 142/68 mmHg  Pulse 68  Ht 6\' 1"  (1.854 m)  Wt 203 lb (92.08 kg)  BMI 26.79 kg/m2  SpO2 94% , Patient is oriented to person time and place. Affect is normal. He is here with his daughter. Head is atraumatic. Sclera and conjunctiva are normal. There is no jugulovenous distention. Lungs reveal decreased breath sounds. Respiratory effort is not labored. Cardiac exam reveals S1 and S2. Abdomen is soft. There is no peripheral edema. There are no musculoskeletal deformities. There are no skin rashes. Neurologic is grossly intact.  EKG:   EKG is not done today.   Recent Labs: No results found for requested labs within last 365 days.    Lipid Panel No results found for: CHOL, TRIG, HDL, CHOLHDL, VLDL, LDLCALC, LDLDIRECT    Wt Readings from Last 3 Encounters:  05/10/15 203 lb (92.08 kg)  02/01/15 200 lb (90.719 kg)  10/15/14 203 lb 6.4 oz (92.262 kg)      Current medicines are reviewed  The patient understands his medications.     ASSESSMENT AND PLAN:

## 2015-05-13 ENCOUNTER — Ambulatory Visit (HOSPITAL_COMMUNITY)
Admission: RE | Admit: 2015-05-13 | Discharge: 2015-05-13 | Disposition: A | Discharge status: Home / Self Care | Payer: Medicare Other | Source: Ambulatory Visit | Attending: Cardiovascular Disease | Admitting: Cardiovascular Disease

## 2015-05-13 ENCOUNTER — Other Ambulatory Visit: Payer: Self-pay | Admitting: Cardiology

## 2015-05-13 DIAGNOSIS — I739 Peripheral vascular disease, unspecified: Secondary | ICD-10-CM

## 2015-05-13 DIAGNOSIS — I6523 Occlusion and stenosis of bilateral carotid arteries: Secondary | ICD-10-CM | POA: Diagnosis not present

## 2015-05-13 DIAGNOSIS — I779 Disorder of arteries and arterioles, unspecified: Secondary | ICD-10-CM

## 2015-08-10 ENCOUNTER — Other Ambulatory Visit: Payer: Self-pay | Admitting: Cardiology

## 2015-08-13 ENCOUNTER — Other Ambulatory Visit: Payer: Self-pay | Admitting: *Deleted

## 2015-08-13 MED ORDER — ALBUTEROL SULFATE HFA 108 (90 BASE) MCG/ACT IN AERS: 2.0000 | INHALATION_SPRAY | Freq: Four times a day (QID) | RESPIRATORY_TRACT | Status: DC | PRN

## 2015-08-29 ENCOUNTER — Telehealth: Payer: Self-pay | Admitting: Pulmonary Disease

## 2015-08-29 NOTE — Telephone Encounter (Signed)
Called and spoke to pt's daughter, [name]. [name] states the [employer] will be very expensive and unaffordable come the first of the year. The pt is requesting to go back on Spiriva and Advair. Pt has a recall in system to see BQ in 01/2016.   Dr. Lake Bells please advise. Thanks.

## 2015-09-02 MED ORDER — TIOTROPIUM BROMIDE MONOHYDRATE 18 MCG IN CAPS: 1.0000 | ORAL_CAPSULE | Freq: Every day | RESPIRATORY_TRACT | Status: DC | PRN

## 2015-09-02 MED ORDER — FLUTICASONE-SALMETEROL 250-50 MCG/DOSE IN AEPB: 1.0000 | INHALATION_SPRAY | Freq: Two times a day (BID) | RESPIRATORY_TRACT | Status: DC

## 2015-09-02 NOTE — Telephone Encounter (Signed)
OK by me, but she needs to make sure that the two of them together are cheaper than Stiolto alone

## 2015-09-02 NOTE — Telephone Encounter (Signed)
Patient's daughter notified of BQ's recommendations. RX sent to pharmacy. Nothing further needed. Closing encounter

## 2015-09-28 ENCOUNTER — Other Ambulatory Visit: Payer: Self-pay | Admitting: Pulmonary Disease

## 2016-02-06 ENCOUNTER — Encounter: Payer: Self-pay | Admitting: Pulmonary Disease

## 2016-02-06 ENCOUNTER — Ambulatory Visit (INDEPENDENT_AMBULATORY_CARE_PROVIDER_SITE_OTHER)
Admission: RE | Admit: 2016-02-06 | Discharge: 2016-02-06 | Disposition: A | Discharge status: Home / Self Care | Payer: Medicare Other | Source: Ambulatory Visit | Attending: Pulmonary Disease | Admitting: Pulmonary Disease

## 2016-02-06 ENCOUNTER — Ambulatory Visit (INDEPENDENT_AMBULATORY_CARE_PROVIDER_SITE_OTHER): Payer: Medicare Other | Admitting: Pulmonary Disease

## 2016-02-06 DIAGNOSIS — R911 Solitary pulmonary nodule: Secondary | ICD-10-CM | POA: Diagnosis not present

## 2016-02-06 DIAGNOSIS — R918 Other nonspecific abnormal finding of lung field: Secondary | ICD-10-CM | POA: Insufficient documentation

## 2016-02-06 DIAGNOSIS — J986 Disorders of diaphragm: Secondary | ICD-10-CM

## 2016-02-06 MED ORDER — UMECLIDINIUM-VILANTEROL 62.5-25 MCG/INH IN AEPB: 1.0000 | INHALATION_SPRAY | Freq: Every day | RESPIRATORY_TRACT | Status: DC

## 2016-02-06 NOTE — Progress Notes (Signed)
Subjective:    Patient ID: Tim [name], male    DOB: October 22, 1928, 82 y.o.   MRN: [id number]  Synopsis: Former patient of Dr. Gwenette Greet with COPD and a paralyzed R Hemidiaphragm as seen on fluoroscopy in 2007. Lung function testing in 2005 showed clear airflow obstruction, FEV1 of 1.53 L (43% predicted), total lung capacity 62% predicted, DLCO 73% predicted. He smoked 2-3 packs per day for 40 years, quit around 1980.  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    HPI Chief Complaint  Patient presents with  . Follow-up    Former [facility] pt being treated for emphysema, DOE.  Pt has no complaints with breathing at this time.     Tim [name] said that he has had a good year.  He was previously seen by Dr. Gwenette Greet for his COPD and follows here with on an annual basis.  He is interested in changing his medications to [employer] from [place].   He says that in the last year he had too take an antibiotic for cellulitis, but he has not had to take it for a respiratory problem.  The same is true for prednisone for a skin issue, but not for his lungs.    He has not been experiencing any breathing difficulty.  He doesn't feel limited by his lungs, but his lungs don't limit him.  He doesn't cough much. He will produce mucus in the mornings.    Past Medical History  Diagnosis Date  . Hypertension   . Hyperlipidemia   . CAD (coronary artery disease)     9 STEMI 2001, PCI, RCA, residual 100% circumflex  /   PCI for in-stent restenosis June, 2001  /  nuclear February, 2010, no ischemia  . Emphysema     Mild  . GERD (gastroesophageal reflux disease)   . Cervical disc disease     Status post neurosurgery  . BPH (benign prostatic hypertrophy)   . Paralyzed hemidiaphragm     Right  . Statin intolerance     Elevated CPK in the past  . Paralyzed hemidiaphragm     Chronic  . Carotid artery disease ([place])     Doppler, June, 2011, 0-39% bilateral  . Colon polyps   .  Elevated CPK     Chronic mild CPK elevation  . Fatigue     Morning fatigue, August, 2011  . Ejection fraction     EF 55-65%, echo, 2009  . Low back pain     February, 2013      Review of Systems     Objective:   Physical Exam Filed Vitals:   02/06/16 1509  BP: 146/84  Pulse: 75  Height: 6\' 1"  (1.854 m)  Weight: 197 lb (89.359 kg)  SpO2: 97%   RA  Gen: well appearing HENT: OP clear, TM's clear, neck supple PULM: CTA B, normal percussion CV: RRR, no mgr, trace edema GI: BS+, soft, nontender Derm: no cyanosis or rash Psyche: normal mood and affect        Assessment & Plan:  COPD (chronic obstructive pulmonary disease) (HCC) Though he has severe airflow obstruction on pulmonary function testing from 12 years ago, this is been a stable interval for him. He would like to try to consolidate his medications to 1 inhaler rather than 2. I think it's reasonable to change him to a long-acting muscarinic antagonist and long acting beta antagonist.  Plan: Change Advair and Spiriva to  Anoro I have instructed him to check his insurance formulary so that we can select the cheapest option for him Follow-up one year or sooner if needed  Solitary pulmonary nodule He had a small pulmonary nodule seen on his chest x-ray in 2015 but when I reviewed the images from his CT chest in 2013 I could not see this. This bothers me.  Plan: Repeat chest x-ray today to evaluate the left upper lobe pulmonary nodule.   > 50% of time spent face to face in a 27 minute visit  Current outpatient prescriptions:  .  acetaminophen (TYLENOL) 500 MG tablet, Take 500 mg by mouth as needed.  , Disp: , Rfl:  .  ADVAIR DISKUS 250-50 MCG/DOSE AEPB, INHALE 1 PUFF INTO THE LUNGS TWICE DAILY, Disp: 60 each, Rfl: 5 .  albuterol (PROVENTIL HFA;VENTOLIN HFA) 108 (90 BASE) MCG/ACT inhaler, Inhale 2 puffs into the lungs every 6 (six) hours as needed for wheezing or shortness of breath., Disp: 1 Inhaler, Rfl: 6 .   aspirin 81 MG tablet, Take 1 tablet (81 mg total) by mouth daily., Disp: 30 tablet, Rfl: 6 .  atenolol (TENORMIN) 50 MG tablet, Take 1 tablet (50 mg total) by mouth daily., Disp: 90 tablet, Rfl: 2 .  azelastine (ASTELIN) 0.1 % nasal spray, Place 2 sprays into both nostrils 2 (two) times daily. Use in each nostril as directed, Disp: , Rfl:  .  doxycycline (PERIOSTAT) 20 MG tablet, Take 20 mg by mouth daily., Disp: , Rfl:  .  finasteride (PROSCAR) 5 MG tablet, Take 1 tablet by mouth daily., Disp: , Rfl:  .  furosemide (LASIX) 40 MG tablet, TAKE 1 TABLET DAILY, Disp: 90 tablet, Rfl: 1 .  mupirocin ointment (BACTROBAN) 2 %, Place 1 application into the nose 2 (two) times daily. Apply to the scalp, Disp: , Rfl:  .  omeprazole (PRILOSEC) 40 MG capsule, Take 1 capsule (40 mg total) by mouth daily., Disp: 90 capsule, Rfl: 3 .  SPIRIVA HANDIHALER 18 MCG inhalation capsule, INHALE ONE CAPSULE VIA HANDIHALER DAILY AS NEEDED, Disp: 30 capsule, Rfl: 5 .  tamsulosin (FLOMAX) 0.4 MG CAPS capsule, Take 0.4 mg by mouth as needed. For urinary symptoms, Disp: , Rfl:  .  Tiotropium Bromide-Olodaterol (STIOLTO RESPIMAT) 2.5-2.5 MCG/ACT AERS, Inhale 2 puffs into the lungs every morning. (Patient not taking: Reported on 02/06/2016), Disp: 1 Inhaler, Rfl: 6 .  umeclidinium-vilanterol (ANORO ELLIPTA) 62.5-25 MCG/INH AEPB, Inhale 1 puff into the lungs daily., Disp: 1 each, Rfl: 0

## 2016-02-06 NOTE — Assessment & Plan Note (Signed)
Though he has severe airflow obstruction on pulmonary function testing from 12 years ago, this is been a stable interval for him. He would like to try to consolidate his medications to 1 inhaler rather than 2. I think it's reasonable to change him to a long-acting muscarinic antagonist and long acting beta antagonist.  Plan: Change Advair and Spiriva to Anoro I have instructed him to check his insurance formulary so that we can select the cheapest option for him Follow-up one year or sooner if needed

## 2016-02-06 NOTE — Patient Instructions (Signed)
We will call you with the results of today's chest x-ray I would like for you to take Anoro 1 puff daily instead of the Advair and Spiriva, call me after taking this medicine for 3-4 days and let me know if you are feeling okay on it. If so then we will change to that medicine. We will see you back in one year or sooner if needed

## 2016-02-06 NOTE — Assessment & Plan Note (Signed)
He had a small pulmonary nodule seen on his chest x-ray in 2015 but when I reviewed the images from his CT chest in 2013 I could not see this. This bothers me.  Plan: Repeat chest x-ray today to evaluate the left upper lobe pulmonary nodule.

## 2016-02-12 ENCOUNTER — Telehealth: Payer: Self-pay | Admitting: Pulmonary Disease

## 2016-02-12 NOTE — Telephone Encounter (Signed)
lmtcb x1 for Tim [name]  

## 2016-02-13 MED ORDER — UMECLIDINIUM-VILANTEROL 62.5-25 MCG/INH IN AEPB: 1.0000 | INHALATION_SPRAY | Freq: Every day | RESPIRATORY_TRACT | Status: DC

## 2016-02-13 NOTE — Telephone Encounter (Signed)
Patient daughter called returning Lindsay's call -prm

## 2016-02-13 NOTE — Telephone Encounter (Signed)
Spoke with pt's daughter, [name]. Pt needs a refill on Anoro. This has been sent in to his preferred pharmacy. Nothing further was needed.

## 2016-05-25 ENCOUNTER — Ambulatory Visit (INDEPENDENT_AMBULATORY_CARE_PROVIDER_SITE_OTHER): Payer: Medicare Other | Admitting: Pulmonary Disease

## 2016-05-25 ENCOUNTER — Encounter: Payer: Self-pay | Admitting: Pulmonary Disease

## 2016-05-25 DIAGNOSIS — Z23 Encounter for immunization: Secondary | ICD-10-CM | POA: Diagnosis not present

## 2016-05-25 DIAGNOSIS — J411 Mucopurulent chronic bronchitis: Secondary | ICD-10-CM | POA: Diagnosis not present

## 2016-05-25 MED ORDER — TIOTROPIUM BROMIDE MONOHYDRATE 18 MCG IN CAPS: ORAL_CAPSULE | RESPIRATORY_TRACT | 5 refills | Status: DC

## 2016-05-25 MED ORDER — FLUTICASONE-SALMETEROL 250-50 MCG/DOSE IN AEPB: 1.0000 | INHALATION_SPRAY | Freq: Two times a day (BID) | RESPIRATORY_TRACT | 5 refills | Status: DC

## 2016-05-25 NOTE — Patient Instructions (Signed)
Stop taking Anoro Start taking Advair and Spiriva again Stay active I will see you back in 6 months or sooner if your shortness of breath and chest congestion does not improve

## 2016-05-25 NOTE — Progress Notes (Signed)
Subjective:    Patient ID: Tim [name], male    DOB: March 21, 1929, 82 y.o.   MRN: [id number]  Synopsis: Former patient of Dr. Gwenette Greet with COPD and a paralyzed R Hemidiaphragm as seen on fluoroscopy in 2007. Lung function testing in 2005 showed clear airflow obstruction, FEV1 of 1.53 L (43% predicted), total lung capacity 62% predicted, DLCO 73% predicted. He smoked 2-3 packs per day for 40 years, quit around 1980.  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    HPI Chief Complaint  Patient presents with  . Follow-up    pt c/o worsening sob, prod cough worse while sleeping and first thing in the morning- mucus is clear/white.  S/s worse X2 wks.     [name] says that he has not been breathing quite as well as he was before our last visit.  Specifically at night he has a lot of phlegm in the night and in the mornings, more than before.  He did OK for several weeks before this happened.  He denies new or worsening sinus symptoms, but he notes more frequent acid reflux symptoms occasionally.    His breathing is not quite as good as it was before we made the change.    Not smoking.  No new animal in the house or water damage.  Past Medical History:  Diagnosis Date  . BPH (benign prostatic hypertrophy)   . CAD (coronary artery disease)    9 STEMI 2001, PCI, RCA, residual 100% circumflex  /   PCI for in-stent restenosis June, 2001  /  nuclear February, 2010, no ischemia  . Carotid artery disease ([place])    Doppler, June, 2011, 0-39% bilateral  . Cervical disc disease    Status post neurosurgery  . Colon polyps   . Ejection fraction    EF 55-65%, echo, 2009  . Elevated CPK    Chronic mild CPK elevation  . Emphysema    Mild  . Fatigue    Morning fatigue, August, 2011  . GERD (gastroesophageal reflux disease)   . Hyperlipidemia   . Hypertension   . Low back pain    February, 2013  . Paralyzed hemidiaphragm    Right  . Paralyzed hemidiaphragm    Chronic    . Statin intolerance    Elevated CPK in the past      Review of Systems     Objective:   Physical Exam Vitals:   05/25/16 0909  BP: (!) 148/84  Pulse: 68  SpO2: 98%  Weight: 206 lb (93.4 kg)  Height: 6\' 1"  (1.854 m)   RA  Gen: well appearing HENT: OP clear, TM's clear, neck supple PULM: Crackles bases B, normal percussion CV: RRR, no mgr, trace edema GI: BS+, soft, nontender Derm: no cyanosis or rash Psyche: normal mood and affect  01/2016 CXR > granuloma left upper lobe      Assessment & Plan:  COPD (chronic obstructive pulmonary disease) ([place]) Korbin did not tolerate changing to a controller regimen without an inhaled steroid.  He had increasing chest congestion and shortness of breath. We know that some patients need to have an inhaled corticosteroid so he appears to be one of them.  Plan: Stop Anoro Start Advair and Spiriva again Flu shot today Return to clinic in 2 months if no improvement with breathing, otherwise follow-up in 6 months   > 50% of time spent face to face in a 26 minute visit  Current Outpatient  Prescriptions:  .  acetaminophen (TYLENOL) 500 MG tablet, Take 500 mg by mouth as needed.  , Disp: , Rfl:  .  albuterol (PROVENTIL HFA;VENTOLIN HFA) 108 (90 BASE) MCG/ACT inhaler, Inhale 2 puffs into the lungs every 6 (six) hours as needed for wheezing or shortness of breath., Disp: 1 Inhaler, Rfl: 6 .  aspirin 81 MG tablet, Take 1 tablet (81 mg total) by mouth daily., Disp: 30 tablet, Rfl: 6 .  atenolol (TENORMIN) 50 MG tablet, Take 1 tablet (50 mg total) by mouth daily., Disp: 90 tablet, Rfl: 2 .  azelastine (ASTELIN) 0.1 % nasal spray, Place 2 sprays into both nostrils 2 (two) times daily. Use in each nostril as directed, Disp: , Rfl:  .  doxycycline (PERIOSTAT) 20 MG tablet, Take 20 mg by mouth daily., Disp: , Rfl:  .  finasteride (PROSCAR) 5 MG tablet, Take 1 tablet by mouth daily., Disp: , Rfl:  .  furosemide (LASIX) 40 MG tablet, TAKE 1  TABLET DAILY, Disp: 90 tablet, Rfl: 1 .  mupirocin ointment (BACTROBAN) 2 %, Place 1 application into the nose 2 (two) times daily. Apply to the scalp, Disp: , Rfl:  .  omeprazole (PRILOSEC) 40 MG capsule, Take 1 capsule (40 mg total) by mouth daily., Disp: 90 capsule, Rfl: 3 .  tamsulosin (FLOMAX) 0.4 MG CAPS capsule, Take 0.4 mg by mouth as needed. For urinary symptoms, Disp: , Rfl:  .  Fluticasone-Salmeterol (ADVAIR DISKUS) 250-50 MCG/DOSE AEPB, Inhale 1 puff into the lungs 2 (two) times daily., Disp: 60 each, Rfl: 5 .  tiotropium (SPIRIVA HANDIHALER) 18 MCG inhalation capsule, INHALE ONE CAPSULE VIA HANDIHALER DAILY AS NEEDED, Disp: 30 capsule, Rfl: 5

## 2016-05-25 NOTE — Assessment & Plan Note (Signed)
Tim [name] did not tolerate changing to a controller regimen without an inhaled steroid.  He had increasing chest congestion and shortness of breath. We know that some patients need to have an inhaled corticosteroid so he appears to be one of them.  Plan: Stop Anoro Start Advair and Spiriva again Flu shot today Return to clinic in 2 months if no improvement with breathing, otherwise follow-up in 6 months

## 2016-08-18 ENCOUNTER — Encounter: Payer: Self-pay | Admitting: *Deleted

## 2016-08-21 ENCOUNTER — Ambulatory Visit (INDEPENDENT_AMBULATORY_CARE_PROVIDER_SITE_OTHER): Payer: Medicare Other | Admitting: Cardiology

## 2016-08-21 ENCOUNTER — Encounter: Payer: Self-pay | Admitting: Cardiology

## 2016-08-21 VITALS — BP 130/78 | HR 70 | Ht 73.0 in | Wt 204.8 lb

## 2016-08-21 DIAGNOSIS — I700 Atherosclerosis of aorta: Secondary | ICD-10-CM

## 2016-08-21 DIAGNOSIS — E7800 Pure hypercholesterolemia, unspecified: Secondary | ICD-10-CM | POA: Diagnosis not present

## 2016-08-21 DIAGNOSIS — I6523 Occlusion and stenosis of bilateral carotid arteries: Secondary | ICD-10-CM

## 2016-08-21 DIAGNOSIS — I2510 Atherosclerotic heart disease of native coronary artery without angina pectoris: Secondary | ICD-10-CM

## 2016-08-21 DIAGNOSIS — Z789 Other specified health status: Secondary | ICD-10-CM | POA: Diagnosis not present

## 2016-08-21 NOTE — Patient Instructions (Signed)

## 2016-08-21 NOTE — Progress Notes (Signed)
Cardiology Office Note    Date:  08/21/2016   ID:  Tim [name], DOB 09-Mar-1929, MRN [id number]  PCP:  Raelene Bott, MD  Cardiologist:   Candee Furbish, MD     History of Present Illness:  Tim [name] is a 82 y.o. male here for follow-up, former patient of Dr. Ron Parker with bilateral carotid artery disease,, coronary artery disease status post STEMI in 2001 with PCI to RCA, residual 100% circumflex, subsequent PCI for in-stent restenosis in June 2001, nuclear in 2014 with no scar, no ischemia 62%. He has had trouble with statins in the past, elevated CPK in the past. Former smoker quit in 1980.  Occasionally he may have sharp chest discomfort when he overexerts himself with some shortness of breath. Believes this is related to his COPD. No syncope, no bleeding.  Has COPD, prior exacerbations. Prior neck surgery.    Past Medical History:  Diagnosis Date  . BPH (benign prostatic hypertrophy)   . CAD (coronary artery disease)    9 STEMI 2001, PCI, RCA, residual 100% circumflex  /   PCI for in-stent restenosis June, 2001  /  nuclear February, 2010, no ischemia  . Carotid artery disease ([place])    Doppler, June, 2011, 0-39% bilateral  . Cervical disc disease    Status post neurosurgery  . Colon polyps   . Ejection fraction    EF 55-65%, echo, 2009  . Elevated CPK    Chronic mild CPK elevation  . Emphysema    Mild  . Fatigue    Morning fatigue, August, 2011  . GERD (gastroesophageal reflux disease)   . Hyperlipidemia   . Hypertension   . Low back pain    February, 2013  . Paralyzed hemidiaphragm    Right  . Paralyzed hemidiaphragm    Chronic  . Statin intolerance    Elevated CPK in the past    Past Surgical History:  Procedure Laterality Date  . BREAST SURGERY     right  . CERVICAL DISC SURGERY    . SKIN CANCER EXCISION      Current Medications: Outpatient Medications Prior to Visit  Medication Sig Dispense Refill  . acetaminophen (TYLENOL) 500 MG tablet  Take 500 mg by mouth as needed.      [name] albuterol (PROVENTIL HFA;VENTOLIN HFA) 108 (90 BASE) MCG/ACT inhaler Inhale 2 puffs into the lungs every 6 (six) hours as needed for wheezing or shortness of breath. 1 Inhaler 6  . aspirin 81 MG tablet Take 1 tablet (81 mg total) by mouth daily. 30 tablet 6  . atenolol (TENORMIN) 50 MG tablet Take 1 tablet (50 mg total) by mouth daily. 90 tablet 2  . azelastine (ASTELIN) 0.1 % nasal spray Place 2 sprays into both nostrils 2 (two) times daily. Use in each nostril as directed    . doxycycline (PERIOSTAT) 20 MG tablet Take 20 mg by mouth daily.    . finasteride (PROSCAR) 5 MG tablet Take 1 tablet by mouth daily.    . Fluticasone-Salmeterol (ADVAIR DISKUS) 250-50 MCG/DOSE AEPB Inhale 1 puff into the lungs 2 (two) times daily. 60 each 5  . furosemide (LASIX) 40 MG tablet TAKE 1 TABLET DAILY 90 tablet 1  . omeprazole (PRILOSEC) 40 MG capsule Take 1 capsule (40 mg total) by mouth daily. 90 capsule 3  . tamsulosin (FLOMAX) 0.4 MG CAPS capsule Take 0.4 mg by mouth as needed. For urinary symptoms    . tiotropium (SPIRIVA HANDIHALER) 18 MCG inhalation capsule INHALE ONE  CAPSULE VIA HANDIHALER DAILY AS NEEDED 30 capsule 5  . mupirocin ointment (BACTROBAN) 2 % Place 1 application into the nose 2 (two) times daily. Apply to the scalp     No facility-administered medications prior to visit.      Allergies:   Statins   Social History   Social History  . Marital status: Married    Spouse name: N/A  . Number of children: N/A  . Years of education: N/A   Occupational History  . retired Games developer    Social History Main Topics  . Smoking status: Former Smoker    Packs/day: 3.00    Years: 40.00    Types: Cigarettes    Quit date: 09/15/1975  . Smokeless tobacco: None  . Alcohol use None  . Drug use: Unknown  . Sexual activity: Not Asked   Other Topics Concern  . None   Social History Narrative  . None     Family History:  The patient's family history  includes Cancer in his brother; Diabetes in his mother and sister; Kidney disease in his brother; Other in his father and sister.   ROS:   Please see the history of present illness.    ROS All other systems reviewed and are negative.   PHYSICAL EXAM:   VS:  BP 130/78 (BP Location: Right Arm, Cuff Size: Normal)   Pulse 70   Ht 6\' 1"  (1.854 m)   Wt 204 lb 12.8 oz (92.9 kg)   BMI 27.02 kg/m    GEN: Well nourished, well developed, in no acute distress  HEENT: normal  Neck: no JVD, carotid bruits, or masses Cardiac: RRR; no murmurs, rubs, or gallops,no edema , difficult to palpate distal pulses Respiratory:  clear to auscultation bilaterally, normal work of breathing GI: soft, nontender, nondistended, + BS MS: no deformity or atrophy  Skin: warm and dry, no rash Neuro:  Alert and Oriented x 3, Strength and sensation are intact Psych: euthymic mood, full affect  Wt Readings from Last 3 Encounters:  08/21/16 204 lb 12.8 oz (92.9 kg)  05/25/16 206 lb (93.4 kg)  02/06/16 197 lb (89.4 kg)      Studies/Labs Reviewed:   EKG:  EKG is ordered today.  The ekg ordered today demonstrates Sinus rhythm, 65, J-point elevation precordial leads, no significant change from prior EKG. Personally viewed.  Recent Labs: No results found for requested labs within last 8760 hours.   Lipid Panel No results found for: CHOL, TRIG, HDL, CHOLHDL, VLDL, LDLCALC, LDLDIRECT  Additional studies/ records that were reviewed today include:  Prior office notes reviewed, lab work reviewed    ASSESSMENT:    1. Atherosclerosis of both carotid arteries   2. Coronary artery disease involving native coronary artery of native heart without angina pectoris   3. Statin intolerance   4. Pure hypercholesterolemia   5. Aortic atherosclerosis (HCC)      PLAN:  In order of problems listed above:  Coronary artery disease  - History of STEMI in 2001, right coronary artery stent placement.  - Stable, no anginal  symptoms.  - Shortness of breath likely related to COPD.  - Stress test in 2014 was low risk. Excellent. Continue to encourage exercise  COPD  - Pulmonary medicine, Dr. Lake Bells  Carotid artery disease  - Moderate, bilateral, watching with carotid Dopplers.  - Carotid duplex on 07/24/16 showed moderate disease 50-69% bilaterally  Statin intolerance/hyperlipidemia  - Unable to take statins.  Mildly dilated abdominal aorta  - 3.2  cm, minimal. Should be of no clinical consequence. Iliac artery showed mild calcified plaque. Mild aortic atherosclerosis seen.  Medication Adjustments/Labs and Tests Ordered: Current medicines are reviewed at length with the patient today.  Concerns regarding medicines are outlined above.  Medication changes, Labs and Tests ordered today are listed in the Patient Instructions below. Patient Instructions  Medication Instructions:  The current medical regimen is effective;  continue present plan and medications.  Follow-Up: Follow up in 1 year with Dr. Marlou Porch.  You will receive a letter in the mail 2 months before you are due.  Please call us when you receive this letter to schedule your follow up appointment.  If you need a refill on your cardiac medications before your next appointment, please call your pharmacy.  Thank you for choosing [facility]!!        Signed, Candee Furbish, MD  08/21/2016 8:56 AM    [facility] [address], [place], [place]  [address] Phone: [phone]; Fax: [phone]

## 2016-08-26 ENCOUNTER — Encounter: Payer: Self-pay | Admitting: Cardiology

## 2016-11-14 ENCOUNTER — Other Ambulatory Visit: Payer: Self-pay | Admitting: Pulmonary Disease

## 2017-02-05 ENCOUNTER — Ambulatory Visit (INDEPENDENT_AMBULATORY_CARE_PROVIDER_SITE_OTHER)
Admission: RE | Admit: 2017-02-05 | Discharge: 2017-02-05 | Disposition: A | Discharge status: Home / Self Care | Payer: Medicare Other | Source: Ambulatory Visit | Attending: Pulmonary Disease | Admitting: Pulmonary Disease

## 2017-02-05 ENCOUNTER — Encounter: Payer: Self-pay | Admitting: Pulmonary Disease

## 2017-02-05 ENCOUNTER — Ambulatory Visit (INDEPENDENT_AMBULATORY_CARE_PROVIDER_SITE_OTHER): Payer: Medicare Other | Admitting: Pulmonary Disease

## 2017-02-05 ENCOUNTER — Telehealth: Payer: Self-pay | Admitting: Pulmonary Disease

## 2017-02-05 VITALS — BP 144/78 | HR 65 | Ht 73.0 in | Wt 193.0 lb

## 2017-02-05 DIAGNOSIS — R0600 Dyspnea, unspecified: Secondary | ICD-10-CM

## 2017-02-05 DIAGNOSIS — J189 Pneumonia, unspecified organism: Secondary | ICD-10-CM | POA: Insufficient documentation

## 2017-02-05 DIAGNOSIS — R0609 Other forms of dyspnea: Secondary | ICD-10-CM

## 2017-02-05 MED ORDER — ALBUTEROL SULFATE (2.5 MG/3ML) 0.083% IN NEBU: 2.5000 mg | INHALATION_SOLUTION | Freq: Four times a day (QID) | RESPIRATORY_TRACT | 12 refills | Status: DC | PRN

## 2017-02-05 MED ORDER — UMECLIDINIUM-VILANTEROL 62.5-25 MCG/INH IN AEPB: 1.0000 | INHALATION_SPRAY | Freq: Every day | RESPIRATORY_TRACT | 5 refills | Status: DC

## 2017-02-05 NOTE — Assessment & Plan Note (Signed)
This has been a stable interval with the exception of an episode of pneumonia. Apart from that after changing to Anoro nightly he says that he has been feeling better.  Plan: Stop Spiriva Stop Advair Use Anoro every night Use albuterol as needed Follow-up 6 months or sooner if needed

## 2017-02-05 NOTE — Telephone Encounter (Signed)
Juanito Doom, MD  Len Blalock, CMA        A,  Please let the patient know this was OK  Thanks,  B    Pt's daughter, [name] [facility]) is aware of results and voiced her understanding. Nothing further needed.

## 2017-02-05 NOTE — Assessment & Plan Note (Signed)
He had pneumonia earlier this year so we will get a chest x-ray today to ensure radiographic resolution.

## 2017-02-05 NOTE — Patient Instructions (Signed)
We will call you with the results of the chest x-ray Stop Spiriva, stop Advair Start Anoro Use albuterol as needed for shortness of breath We will see you back in 6 months or sooner if needed

## 2017-02-05 NOTE — Progress Notes (Signed)
Subjective:    Patient ID: Tim [name], male    DOB: [birth year], 83 y.o.   MRN: [id number]  Synopsis: Former patient of Dr. Gwenette Greet with COPD and a paralyzed R Hemidiaphragm as seen on fluoroscopy in 2007. Lung function testing in 2005 showed clear airflow obstruction, FEV1 of 1.53 L (43% predicted), total lung capacity 62% predicted, DLCO 73% predicted. He smoked 2-3 packs per day for 40 years, quit around 1980.  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    HPI Chief Complaint  Patient presents with  . Follow-up    pt doing well, takes anoro qhs and denies any current breathing complaints.     Mr. Bulman said that he caught pneumonia over the winter time this year.  He ended up going to an Urgent Care facility and needed antibiotics for it.  He had thrush as a complication of the antibiotic.  His breathing has improved.  He stopped Spiriva and Advair and started Anoro.  He has been doing well with that.  He has been on albuterol prn.  He needs it maybe one time per week.  He takes the Anoro at night which he thinks is working better.     Past Medical History:  Diagnosis Date  . BPH (benign prostatic hypertrophy)   . CAD (coronary artery disease)    9 STEMI 2001, PCI, RCA, residual 100% circumflex  /   PCI for in-stent restenosis June, 2001  /  nuclear February, 2010, no ischemia  . Carotid artery disease ([place])    Doppler, June, 2011, 0-39% bilateral  . Cervical disc disease    Status post neurosurgery  . Colon polyps   . Ejection fraction    EF 55-65%, echo, 2009  . Elevated CPK    Chronic mild CPK elevation  . Emphysema    Mild  . Fatigue    Morning fatigue, August, 2011  . GERD (gastroesophageal reflux disease)   . Hyperlipidemia   . Hypertension   . Low back pain    February, 2013  . Paralyzed hemidiaphragm    Right  . Paralyzed hemidiaphragm    Chronic  . Statin intolerance    Elevated CPK in the past      Review of  Systems  Constitutional: Negative for fatigue and fever.  HENT: Negative for postnasal drip and rhinorrhea.   Respiratory: Positive for cough. Negative for shortness of breath and wheezing.   Cardiovascular: Negative for chest pain and leg swelling.       Objective:   Physical Exam Vitals:   02/05/17 0857  BP: (!) 144/78  Pulse: 65  SpO2: 96%  Weight: 193 lb (87.5 kg)  Height: 6\' 1"  (1.854 m)   RA  Gen: well appearing HENT: OP clear, TM's clear, neck supple PULM: Crackles bases B, normal percussion CV: RRR, no mgr, trace edema GI: BS+, soft, nontender Derm: no cyanosis or rash Psyche: normal mood and affect   01/2016 CXR > granuloma left upper lobe      Assessment & Plan:  CAP (community acquired pneumonia) He had pneumonia earlier this year so we will get a chest x-ray today to ensure radiographic resolution.  COPD (chronic obstructive pulmonary disease) ([place]) This has been a stable interval with the exception of an episode of pneumonia. Apart from that after changing to Anoro nightly he says that he has been feeling better.  Plan: Stop Spiriva Stop Advair Use Anoro every night Use albuterol  as needed Follow-up 6 months or sooner if needed    Current Outpatient Prescriptions:  .  acetaminophen (TYLENOL) 500 MG tablet, Take 500 mg by mouth as needed.  , Disp: , Rfl:  .  albuterol (PROVENTIL HFA;VENTOLIN HFA) 108 (90 BASE) MCG/ACT inhaler, Inhale 2 puffs into the lungs every 6 (six) hours as needed for wheezing or shortness of breath., Disp: 1 Inhaler, Rfl: 6 .  aspirin 81 MG tablet, Take 1 tablet (81 mg total) by mouth daily., Disp: 30 tablet, Rfl: 6 .  atenolol (TENORMIN) 50 MG tablet, Take 1 tablet (50 mg total) by mouth daily., Disp: 90 tablet, Rfl: 2 .  azelastine (ASTELIN) 0.1 % nasal spray, Place 2 sprays into both nostrils 2 (two) times daily. Use in each nostril as directed, Disp: , Rfl:  .  doxycycline (PERIOSTAT) 20 MG tablet, Take 20 mg by mouth  daily., Disp: , Rfl:  .  finasteride (PROSCAR) 5 MG tablet, Take 1 tablet by mouth daily., Disp: , Rfl:  .  furosemide (LASIX) 40 MG tablet, TAKE 1 TABLET DAILY, Disp: 90 tablet, Rfl: 1 .  omeprazole (PRILOSEC) 40 MG capsule, Take 1 capsule (40 mg total) by mouth daily., Disp: 90 capsule, Rfl: 3 .  tamsulosin (FLOMAX) 0.4 MG CAPS capsule, Take 0.4 mg by mouth as needed. For urinary symptoms, Disp: , Rfl:  .  umeclidinium-vilanterol (ANORO ELLIPTA) 62.5-25 MCG/INH AEPB, Inhale 1 puff into the lungs daily., Disp: 60 each, Rfl: 5 .  albuterol (PROVENTIL) (2.5 MG/3ML) 0.083% nebulizer solution, Take 3 mLs (2.5 mg total) by nebulization every 6 (six) hours as needed for wheezing or shortness of breath., Disp: 75 mL, Rfl: 12

## 2017-03-25 ENCOUNTER — Encounter (INDEPENDENT_AMBULATORY_CARE_PROVIDER_SITE_OTHER): Payer: Medicare Other | Admitting: Ophthalmology

## 2017-03-25 DIAGNOSIS — H35411 Lattice degeneration of retina, right eye: Secondary | ICD-10-CM | POA: Diagnosis not present

## 2017-03-25 DIAGNOSIS — H33301 Unspecified retinal break, right eye: Secondary | ICD-10-CM

## 2017-03-25 DIAGNOSIS — H353112 Nonexudative age-related macular degeneration, right eye, intermediate dry stage: Secondary | ICD-10-CM | POA: Diagnosis not present

## 2017-03-25 DIAGNOSIS — H353221 Exudative age-related macular degeneration, left eye, with active choroidal neovascularization: Secondary | ICD-10-CM | POA: Diagnosis not present

## 2017-03-25 DIAGNOSIS — H43813 Vitreous degeneration, bilateral: Secondary | ICD-10-CM

## 2017-04-09 ENCOUNTER — Encounter (INDEPENDENT_AMBULATORY_CARE_PROVIDER_SITE_OTHER): Payer: Medicare Other | Admitting: Ophthalmology

## 2017-04-09 DIAGNOSIS — H33301 Unspecified retinal break, right eye: Secondary | ICD-10-CM | POA: Diagnosis not present

## 2017-04-19 ENCOUNTER — Encounter (INDEPENDENT_AMBULATORY_CARE_PROVIDER_SITE_OTHER): Payer: Medicare Other | Admitting: Ophthalmology

## 2017-04-19 DIAGNOSIS — H353112 Nonexudative age-related macular degeneration, right eye, intermediate dry stage: Secondary | ICD-10-CM | POA: Diagnosis not present

## 2017-04-19 DIAGNOSIS — H43813 Vitreous degeneration, bilateral: Secondary | ICD-10-CM | POA: Diagnosis not present

## 2017-04-19 DIAGNOSIS — H353221 Exudative age-related macular degeneration, left eye, with active choroidal neovascularization: Secondary | ICD-10-CM

## 2017-04-19 DIAGNOSIS — H33301 Unspecified retinal break, right eye: Secondary | ICD-10-CM | POA: Diagnosis not present

## 2017-05-18 ENCOUNTER — Encounter (INDEPENDENT_AMBULATORY_CARE_PROVIDER_SITE_OTHER): Payer: Medicare Other | Admitting: Ophthalmology

## 2017-05-18 DIAGNOSIS — H353221 Exudative age-related macular degeneration, left eye, with active choroidal neovascularization: Secondary | ICD-10-CM | POA: Diagnosis not present

## 2017-05-18 DIAGNOSIS — H353112 Nonexudative age-related macular degeneration, right eye, intermediate dry stage: Secondary | ICD-10-CM | POA: Diagnosis not present

## 2017-05-18 DIAGNOSIS — H33301 Unspecified retinal break, right eye: Secondary | ICD-10-CM

## 2017-05-18 DIAGNOSIS — H43813 Vitreous degeneration, bilateral: Secondary | ICD-10-CM

## 2017-06-14 ENCOUNTER — Encounter (INDEPENDENT_AMBULATORY_CARE_PROVIDER_SITE_OTHER): Payer: Medicare Other | Admitting: Ophthalmology

## 2017-06-14 DIAGNOSIS — H35033 Hypertensive retinopathy, bilateral: Secondary | ICD-10-CM | POA: Diagnosis not present

## 2017-06-14 DIAGNOSIS — H353112 Nonexudative age-related macular degeneration, right eye, intermediate dry stage: Secondary | ICD-10-CM | POA: Diagnosis not present

## 2017-06-14 DIAGNOSIS — I10 Essential (primary) hypertension: Secondary | ICD-10-CM

## 2017-06-14 DIAGNOSIS — H33301 Unspecified retinal break, right eye: Secondary | ICD-10-CM | POA: Diagnosis not present

## 2017-06-14 DIAGNOSIS — H43813 Vitreous degeneration, bilateral: Secondary | ICD-10-CM | POA: Diagnosis not present

## 2017-06-14 DIAGNOSIS — H353221 Exudative age-related macular degeneration, left eye, with active choroidal neovascularization: Secondary | ICD-10-CM | POA: Diagnosis not present

## 2017-07-12 ENCOUNTER — Encounter (INDEPENDENT_AMBULATORY_CARE_PROVIDER_SITE_OTHER): Payer: Medicare Other | Admitting: Ophthalmology

## 2017-07-12 DIAGNOSIS — H353112 Nonexudative age-related macular degeneration, right eye, intermediate dry stage: Secondary | ICD-10-CM

## 2017-07-12 DIAGNOSIS — H33301 Unspecified retinal break, right eye: Secondary | ICD-10-CM

## 2017-07-12 DIAGNOSIS — H353221 Exudative age-related macular degeneration, left eye, with active choroidal neovascularization: Secondary | ICD-10-CM

## 2017-07-12 DIAGNOSIS — H43813 Vitreous degeneration, bilateral: Secondary | ICD-10-CM

## 2017-07-27 ENCOUNTER — Ambulatory Visit (INDEPENDENT_AMBULATORY_CARE_PROVIDER_SITE_OTHER): Payer: Medicare Other | Admitting: Cardiology

## 2017-07-27 ENCOUNTER — Encounter: Payer: Self-pay | Admitting: *Deleted

## 2017-07-27 ENCOUNTER — Encounter: Payer: Self-pay | Admitting: Cardiology

## 2017-07-27 VITALS — BP 154/90 | HR 91 | Ht 71.0 in | Wt 205.8 lb

## 2017-07-27 DIAGNOSIS — Z789 Other specified health status: Secondary | ICD-10-CM

## 2017-07-27 DIAGNOSIS — Z01812 Encounter for preprocedural laboratory examination: Secondary | ICD-10-CM

## 2017-07-27 DIAGNOSIS — I200 Unstable angina: Secondary | ICD-10-CM | POA: Diagnosis not present

## 2017-07-27 DIAGNOSIS — I2510 Atherosclerotic heart disease of native coronary artery without angina pectoris: Secondary | ICD-10-CM | POA: Diagnosis not present

## 2017-07-27 MED ORDER — IPRATROPIUM-ALBUTEROL 0.5-2.5 (3) MG/3ML IN SOLN
3.00 | RESPIRATORY_TRACT | Status: DC
Start: ? — End: 2017-07-27

## 2017-07-27 MED ORDER — ATENOLOL 50 MG PO TABS
50.00 mg | ORAL_TABLET | ORAL | Status: DC
Start: 2017-07-26 — End: 2017-07-27

## 2017-07-27 MED ORDER — IPRATROPIUM-ALBUTEROL 0.5-2.5 (3) MG/3ML IN SOLN
3.00 | RESPIRATORY_TRACT | Status: DC
Start: 2017-07-25 — End: 2017-07-27

## 2017-07-27 MED ORDER — AZITHROMYCIN 250 MG PO TABS
250.00 mg | ORAL_TABLET | ORAL | Status: DC
Start: 2017-07-25 — End: 2017-07-27

## 2017-07-27 MED ORDER — ASPIRIN EC 81 MG PO TBEC
81.00 mg | DELAYED_RELEASE_TABLET | ORAL | Status: DC
Start: 2017-07-26 — End: 2017-07-27

## 2017-07-27 MED ORDER — HEPARIN SODIUM (PORCINE) 5000 UNIT/ML IJ SOLN
5000.00 | INTRAMUSCULAR | Status: DC
Start: 2017-07-25 — End: 2017-07-27

## 2017-07-27 MED ORDER — GENERIC EXTERNAL MEDICATION
1.00 | Status: DC
Start: 2017-07-25 — End: 2017-07-27

## 2017-07-27 MED ORDER — ISOSORBIDE MONONITRATE ER 30 MG PO TB24: 30.0000 mg | ORAL_TABLET | Freq: Every day | ORAL | 6 refills | Status: DC

## 2017-07-27 MED ORDER — GENERIC EXTERNAL MEDICATION
Status: DC
Start: ? — End: 2017-07-27

## 2017-07-27 MED ORDER — ALUMINUM-MAGNESIUM-SIMETHICONE 200-200-20 MG/5ML PO SUSP
30.00 | ORAL | Status: DC
Start: ? — End: 2017-07-27

## 2017-07-27 MED ORDER — ACETAMINOPHEN 325 MG PO TABS
650.00 mg | ORAL_TABLET | ORAL | Status: DC
Start: ? — End: 2017-07-27

## 2017-07-27 MED ORDER — TAMSULOSIN HCL 0.4 MG PO CAPS
0.40 mg | ORAL_CAPSULE | ORAL | Status: DC
Start: 2017-07-25 — End: 2017-07-27

## 2017-07-27 MED ORDER — FUROSEMIDE 40 MG PO TABS
40.00 mg | ORAL_TABLET | ORAL | Status: DC
Start: 2017-07-26 — End: 2017-07-27

## 2017-07-27 MED ORDER — FINASTERIDE 5 MG PO TABS
10.00 mg | ORAL_TABLET | ORAL | Status: DC
Start: 2017-07-26 — End: 2017-07-27

## 2017-07-27 MED ORDER — PANTOPRAZOLE SODIUM 20 MG PO TBEC
20.00 mg | DELAYED_RELEASE_TABLET | ORAL | Status: DC
Start: 2017-07-25 — End: 2017-07-27

## 2017-07-27 NOTE — Progress Notes (Signed)
Cardiology Office Note    Date:  07/27/2017   ID:  Tim [name], DOB [birth year], MRN [id number]  PCP:  Tim Bott, MD  Cardiologist:   Tim Furbish, MD     History of Present Illness:  Tim [name] is a 83 y.o. male here for follow-up, former patient of Dr. Ron [name] with bilateral carotid artery disease,, coronary artery disease status post STEMI in 2001 with PCI to RCA, residual 100% circumflex, subsequent PCI for in-stent restenosis in June 2001, nuclear in 2014 with no scar, no ischemia 62%. He has had trouble with statins in the past, elevated CPK in the past. Former smoker quit in 1980.  Occasionally he may have sharp chest discomfort when he overexerts himself with some shortness of breath. Believes this is related to his COPD. No syncope, no bleeding.  Has COPD, prior exacerbations. Prior neck surgery.  07/27/17-he was seen at [facility] with minimally elevated troponin, increased slightly.  This was thought to be secondary to elevated blood pressure and demand ischemia.  He was placed on a heparin shot.  He states.  His blood pressure was as high as 202 systolic.  This was unusual for him.  His troponin was 0.07.  Down trended.  EKG showed no changes.  It was recommended that he consider a nuclear stress test in the future, most recent was 2014 and was normal.  Finished a course of Z-Pak.  - Chest tight at 3am. ?GERD he says. Took ASA. Tight in neck. Has had cough, wheeze. Prednisone.  States that he has been loudly wheezing.   Past Medical History:  Diagnosis Date  . BPH (benign prostatic hypertrophy)   . CAD (coronary artery disease)    9 STEMI 2001, PCI, RCA, residual 100% circumflex  /   PCI for in-stent restenosis June, 2001  /  nuclear February, 2010, no ischemia  . Carotid artery disease ([place])    Doppler, June, 2011, 0-39% bilateral  . Cervical disc disease    Status post neurosurgery  . Colon polyps   . Ejection fraction    EF 55-65%, echo, 2009    . Elevated CPK    Chronic mild CPK elevation  . Emphysema    Mild  . Fatigue    Morning fatigue, August, 2011  . GERD (gastroesophageal reflux disease)   . Hyperlipidemia   . Hypertension   . Low back pain    February, 2013  . Paralyzed hemidiaphragm    Right  . Paralyzed hemidiaphragm    Chronic  . Statin intolerance    Elevated CPK in the past    Past Surgical History:  Procedure Laterality Date  . BREAST SURGERY     right  . CERVICAL DISC SURGERY    . SKIN CANCER EXCISION      Current Medications: Outpatient Medications Prior to Visit  Medication Sig Dispense Refill  . acetaminophen (TYLENOL) 500 MG tablet Take 500 mg every 8 (eight) hours as needed by mouth (pain).     [name] albuterol (PROVENTIL HFA;VENTOLIN HFA) 108 (90 BASE) MCG/ACT inhaler Inhale 2 puffs into the lungs every 6 (six) hours as needed for wheezing or shortness of breath. 1 Inhaler 6  . albuterol (PROVENTIL) (2.5 MG/3ML) 0.083% nebulizer solution Take 3 mLs (2.5 mg total) by nebulization every 6 (six) hours as needed for wheezing or shortness of breath. 75 mL 12  . aspirin 81 MG tablet Take 1 tablet (81 mg total) by mouth daily. 30 tablet 6  .  atenolol (TENORMIN) 50 MG tablet Take 1 tablet (50 mg total) by mouth daily. 90 tablet 2  . azelastine (ASTELIN) 0.1 % nasal spray Place 2 sprays into both nostrils 2 (two) times daily. Use in each nostril as directed    . doxycycline (PERIOSTAT) 20 MG tablet Take 20 mg by mouth daily.    . finasteride (PROSCAR) 5 MG tablet Take 1 tablet by mouth daily.    . furosemide (LASIX) 40 MG tablet Take 40 mg daily as needed by mouth (chest pressure).    [name] omeprazole (PRILOSEC) 40 MG capsule Take 1 capsule (40 mg total) by mouth daily. 90 capsule 3  . tamsulosin (FLOMAX) 0.4 MG CAPS capsule Take 0.4 mg by mouth as needed. For urinary symptoms    . umeclidinium-vilanterol (ANORO ELLIPTA) 62.5-25 MCG/INH AEPB Inhale 1 puff into the lungs daily. 60 each 5  . furosemide (LASIX) 40  MG tablet TAKE 1 TABLET DAILY (Patient not taking: Reported on 07/27/2017) 90 tablet 1   No facility-administered medications prior to visit.      Allergies:   Statins   Social History   Socioeconomic History  . Marital status: Married    Spouse name: None  . Number of children: None  . Years of education: None  . Highest education level: None  Social Needs  . Financial resource strain: None  . Food insecurity - worry: None  . Food insecurity - inability: None  . Transportation needs - medical: None  . Transportation needs - non-medical: None  Occupational History  . Occupation: retired Games developer  Tobacco Use  . Smoking status: Former Smoker    Packs/day: 3.00    Years: 40.00    Pack years: 120.00    Types: Cigarettes    Last attempt to quit: 09/15/1975    Years since quitting: 41.8  . Smokeless tobacco: Never Used  Substance and Sexual Activity  . Alcohol use: None  . Drug use: None  . Sexual activity: None  Other Topics Concern  . None  Social History Narrative  . None     Family History:  The patient's family history includes Cancer in his brother; Diabetes in his mother and sister; Kidney disease in his brother; Other in his father and sister.   ROS:   Please see the history of present illness.    ROS All other systems reviewed and are negative.   PHYSICAL EXAM:   VS:  BP (!) 154/90   Pulse 91   Ht 5\' 11"  (1.803 m)   Wt 205 lb 12.8 oz (93.4 kg)   BMI 28.70 kg/m    GEN: Well nourished, well developed, in no acute distress  HEENT: normal  Neck: no JVD, carotid bruits, or masses Cardiac: RRR; no murmurs, rubs, or gallops,no edema  Respiratory:  clear to auscultation bilaterally, normal work of breathing, no active wheezing GI: soft, nontender, nondistended, + BS MS: no deformity or atrophy  Skin: warm and dry, no rash, 2+ radial pulses Neuro:  Alert and Oriented x 3, Strength and sensation are intact Psych: euthymic mood, full affect   Wt Readings  from Last 3 Encounters:  07/27/17 205 lb 12.8 oz (93.4 kg)  02/05/17 193 lb (87.5 kg)  08/21/16 204 lb 12.8 oz (92.9 kg)      Studies/Labs Reviewed:   EKG:  EKG is ordered today.  The ekg ordered today demonstrates 07/27/17-sinus rhythm specific ST-T wave changes, mild peaking of T waves in V2.  Personally viewed-prior sinus rhythm, 65,  J-point elevation precordial leads, no significant change from prior EKG. Personally viewed.  Recent Labs: No results found for requested labs within last 8760 hours.   Lipid Panel No results found for: CHOL, TRIG, HDL, CHOLHDL, VLDL, LDLCALC, LDLDIRECT  Additional studies/ records that were reviewed today include:  Prior office notes reviewed, lab work reviewed    ASSESSMENT:    1. Unstable angina ([place])   2. Pre-operative laboratory examination   3. Coronary artery disease involving native coronary artery of native heart without angina pectoris   4. Statin intolerance      PLAN:  In order of problems listed above:  Unstable angina/recently elevated troponin mild  -Certainly this could have been demand ischemia however he has woken up with chest tightness, discomfort as well as neck discomfort.  I am concerned that his coronary artery disease may have progressed.  We discussed options such as stress test versus cardiac cath and we wish to proceed with cardiac cath.  Risks and benefits including stroke, heart attack, death, renal impairment, bleeding have been discussed.  Radial approach.  I will also add isosorbide 30 mg.  Continue with atenolol 50 mg.  Given his recent increase in blood pressure, it would not be unreasonable to perform a descending aortic injection to ensure that he does not have any evidence of new renal artery stenosis.  Coronary artery disease   - History of STEMI in 2001, right coronary artery stent placement.  - Shortness of breath likely related to COPD.  His elevation in blood pressure could have been secondary to  prednisone as well.  - Stress test in 2014 was low risk.   COPD  - Pulmonary medicine, Dr. [place] Bells he is seeing soon.  Carotid artery disease  - Moderate, bilateral, watching with carotid Dopplers.  - Carotid duplex on 07/24/16 showed moderate disease 50-69% bilaterally  Statin intolerance/hyperlipidemia  - Unable to take statins.  Trying to encourage.  Mildly dilated abdominal aorta  - 3.2 cm, minimal. Should be of no clinical consequence. Iliac artery showed mild calcified plaque. Mild aortic atherosclerosis seen.  Medication Adjustments/Labs and Tests Ordered: Current medicines are reviewed at length with the patient today.  Concerns regarding medicines are outlined above.  Medication changes, Labs and Tests ordered today are listed in the Patient Instructions below. Patient Instructions  Medication Instructions:  Please start Isosorbide 30 mg a day. Continue all other medications as listed.  Labwork: Please have blood work today (BMP, CBC and PT/INR)  Testing/Procedures: Your physician has requested that you have a cardiac catheterization. Cardiac catheterization is used to diagnose and/or treat various heart conditions. Doctors may recommend this procedure for a number of different reasons. The most common reason is to evaluate chest pain. Chest pain can be a symptom of coronary artery disease (CAD), and cardiac catheterization can show whether plaque is narrowing or blocking your heart's arteries. This procedure is also used to evaluate the valves, as well as measure the blood flow and oxygen levels in different parts of your heart. For further information please visit [email or web address]. Please follow instruction sheet, as given.  Follow-Up: Follow up with Dr Marlou Porch or NP/PA 2 to 3 weeks after your heart cath.  If you need a refill on your cardiac medications before your next appointment, please call your pharmacy.  Thank you for choosing [facility]!!         Signed, Tim Furbish, MD  07/27/2017 10:10 AM    [facility]  [address], [place], [place]  [address] Phone: [phone]; Fax: [phone]

## 2017-07-27 NOTE — Patient Instructions (Signed)
Medication Instructions:  Please start Isosorbide 30 mg a day. Continue all other medications as listed.  Labwork: Please have blood work today (BMP, CBC and PT/INR)  Testing/Procedures: Your physician has requested that you have a cardiac catheterization. Cardiac catheterization is used to diagnose and/or treat various heart conditions. Doctors may recommend this procedure for a number of different reasons. The most common reason is to evaluate chest pain. Chest pain can be a symptom of coronary artery disease (CAD), and cardiac catheterization can show whether plaque is narrowing or blocking your heart's arteries. This procedure is also used to evaluate the valves, as well as measure the blood flow and oxygen levels in different parts of your heart. For further information please visit [email or web address]. Please follow instruction sheet, as given.  Follow-Up: Follow up with Dr Marlou Porch or NP/PA 2 to 3 weeks after your heart cath.  If you need a refill on your cardiac medications before your next appointment, please call your pharmacy.  Thank you for choosing [facility]!!

## 2017-07-27 NOTE — H&P (View-Only) (Signed)
Cardiology Office Note    Date:  07/27/2017   ID:  [name], DOB Mar 31, 1929, MRN [id number]  PCP:  Raelene Bott, MD  Cardiologist:   Candee Furbish, MD     History of Present Illness:  Tim [name] is a 83 y.o. male here for follow-up, former patient of Dr. Ron Parker with bilateral carotid artery disease,, coronary artery disease status post STEMI in 2001 with PCI to RCA, residual 100% circumflex, subsequent PCI for in-stent restenosis in June 2001, nuclear in 2014 with no scar, no ischemia 62%. He has had trouble with statins in the past, elevated CPK in the past. Former smoker quit in 1980.  Occasionally he may have sharp chest discomfort when he overexerts himself with some shortness of breath. Believes this is related to his COPD. No syncope, no bleeding.  Has COPD, prior exacerbations. Prior neck surgery.  07/27/17-he was seen at [facility] with minimally elevated troponin, increased slightly.  This was thought to be secondary to elevated blood pressure and demand ischemia.  He was placed on a heparin shot.  He states.  His blood pressure was as high as 354 systolic.  This was unusual for him.  His troponin was 0.07.  Down trended.  EKG showed no changes.  It was recommended that he consider a nuclear stress test in the future, most recent was 2014 and was normal.  Finished a course of Z-Pak.  - Chest tight at 3am. ?GERD he says. Took ASA. Tight in neck. Has had cough, wheeze. Prednisone.  States that he has been loudly wheezing.   Past Medical History:  Diagnosis Date  . BPH (benign prostatic hypertrophy)   . CAD (coronary artery disease)    9 STEMI 2001, PCI, RCA, residual 100% circumflex  /   PCI for in-stent restenosis June, 2001  /  nuclear February, 2010, no ischemia  . Carotid artery disease ([place])    Doppler, June, 2011, 0-39% bilateral  . Cervical disc disease    Status post neurosurgery  . Colon polyps   . Ejection fraction    EF 55-65%, echo, 2009    . Elevated CPK    Chronic mild CPK elevation  . Emphysema    Mild  . Fatigue    Morning fatigue, August, 2011  . GERD (gastroesophageal reflux disease)   . Hyperlipidemia   . Hypertension   . Low back pain    February, 2013  . Paralyzed hemidiaphragm    Right  . Paralyzed hemidiaphragm    Chronic  . Statin intolerance    Elevated CPK in the past    Past Surgical History:  Procedure Laterality Date  . BREAST SURGERY     right  . CERVICAL DISC SURGERY    . SKIN CANCER EXCISION      Current Medications: Outpatient Medications Prior to Visit  Medication Sig Dispense Refill  . acetaminophen (TYLENOL) 500 MG tablet Take 500 mg every 8 (eight) hours as needed by mouth (pain).     [name] albuterol (PROVENTIL HFA;VENTOLIN HFA) 108 (90 BASE) MCG/ACT inhaler Inhale 2 puffs into the lungs every 6 (six) hours as needed for wheezing or shortness of breath. 1 Inhaler 6  . albuterol (PROVENTIL) (2.5 MG/3ML) 0.083% nebulizer solution Take 3 mLs (2.5 mg total) by nebulization every 6 (six) hours as needed for wheezing or shortness of breath. 75 mL 12  . aspirin 81 MG tablet Take 1 tablet (81 mg total) by mouth daily. 30 tablet 6  .  atenolol (TENORMIN) 50 MG tablet Take 1 tablet (50 mg total) by mouth daily. 90 tablet 2  . azelastine (ASTELIN) 0.1 % nasal spray Place 2 sprays into both nostrils 2 (two) times daily. Use in each nostril as directed    . doxycycline (PERIOSTAT) 20 MG tablet Take 20 mg by mouth daily.    . finasteride (PROSCAR) 5 MG tablet Take 1 tablet by mouth daily.    . furosemide (LASIX) 40 MG tablet Take 40 mg daily as needed by mouth (chest pressure).    [name] omeprazole (PRILOSEC) 40 MG capsule Take 1 capsule (40 mg total) by mouth daily. 90 capsule 3  . tamsulosin (FLOMAX) 0.4 MG CAPS capsule Take 0.4 mg by mouth as needed. For urinary symptoms    . umeclidinium-vilanterol (ANORO ELLIPTA) 62.5-25 MCG/INH AEPB Inhale 1 puff into the lungs daily. 60 each 5  . furosemide (LASIX) 40  MG tablet TAKE 1 TABLET DAILY (Patient not taking: Reported on 07/27/2017) 90 tablet 1   No facility-administered medications prior to visit.      Allergies:   Statins   Social History   Socioeconomic History  . Marital status: Married    Spouse name: None  . Number of children: None  . Years of education: None  . Highest education level: None  Social Needs  . Financial resource strain: None  . Food insecurity - worry: None  . Food insecurity - inability: None  . Transportation needs - medical: None  . Transportation needs - non-medical: None  Occupational History  . Occupation: retired Games developer  Tobacco Use  . Smoking status: Former Smoker    Packs/day: 3.00    Years: 40.00    Pack years: 120.00    Types: Cigarettes    Last attempt to quit: 09/15/1975    Years since quitting: 41.8  . Smokeless tobacco: Never Used  Substance and Sexual Activity  . Alcohol use: None  . Drug use: None  . Sexual activity: None  Other Topics Concern  . None  Social History Narrative  . None     Family History:  The patient's family history includes Cancer in his brother; Diabetes in his mother and sister; Kidney disease in his brother; Other in his father and sister.   ROS:   Please see the history of present illness.    ROS All other systems reviewed and are negative.   PHYSICAL EXAM:   VS:  BP (!) 154/90   Pulse 91   Ht 5\' 11"  (1.803 m)   Wt 205 lb 12.8 oz (93.4 kg)   BMI 28.70 kg/m    GEN: Well nourished, well developed, in no acute distress  HEENT: normal  Neck: no JVD, carotid bruits, or masses Cardiac: RRR; no murmurs, rubs, or gallops,no edema  Respiratory:  clear to auscultation bilaterally, normal work of breathing, no active wheezing GI: soft, nontender, nondistended, + BS MS: no deformity or atrophy  Skin: warm and dry, no rash, 2+ radial pulses Neuro:  Alert and Oriented x 3, Strength and sensation are intact Psych: euthymic mood, full affect   Wt Readings  from Last 3 Encounters:  07/27/17 205 lb 12.8 oz (93.4 kg)  02/05/17 193 lb (87.5 kg)  08/21/16 204 lb 12.8 oz (92.9 kg)      Studies/Labs Reviewed:   EKG:  EKG is ordered today.  The ekg ordered today demonstrates 07/27/17-sinus rhythm specific ST-T wave changes, mild peaking of T waves in V2.  Personally viewed-prior sinus rhythm, 65,  J-point elevation precordial leads, no significant change from prior EKG. Personally viewed.  Recent Labs: No results found for requested labs within last 8760 hours.   Lipid Panel No results found for: CHOL, TRIG, HDL, CHOLHDL, VLDL, LDLCALC, LDLDIRECT  Additional studies/ records that were reviewed today include:  Prior office notes reviewed, lab work reviewed    ASSESSMENT:    1. Unstable angina ([place])   2. Pre-operative laboratory examination   3. Coronary artery disease involving native coronary artery of native heart without angina pectoris   4. Statin intolerance      PLAN:  In order of problems listed above:  Unstable angina/recently elevated troponin mild  -Certainly this could have been demand ischemia however he has woken up with chest tightness, discomfort as well as neck discomfort.  I am concerned that his coronary artery disease may have progressed.  We discussed options such as stress test versus cardiac cath and we wish to proceed with cardiac cath.  Risks and benefits including stroke, heart attack, death, renal impairment, bleeding have been discussed.  Radial approach.  I will also add isosorbide 30 mg.  Continue with atenolol 50 mg.  Given his recent increase in blood pressure, it would not be unreasonable to perform a descending aortic injection to ensure that he does not have any evidence of new renal artery stenosis.  Coronary artery disease   - History of STEMI in 2001, right coronary artery stent placement.  - Shortness of breath likely related to COPD.  His elevation in blood pressure could have been secondary to  prednisone as well.  - Stress test in 2014 was low risk.   COPD  - Pulmonary medicine, Dr. Lake Bells he is seeing soon.  Carotid artery disease  - Moderate, bilateral, watching with carotid Dopplers.  - Carotid duplex on 07/24/16 showed moderate disease 50-69% bilaterally  Statin intolerance/hyperlipidemia  - Unable to take statins.  Trying to encourage.  Mildly dilated abdominal aorta  - 3.2 cm, minimal. Should be of no clinical consequence. Iliac artery showed mild calcified plaque. Mild aortic atherosclerosis seen.  Medication Adjustments/Labs and Tests Ordered: Current medicines are reviewed at length with the patient today.  Concerns regarding medicines are outlined above.  Medication changes, Labs and Tests ordered today are listed in the Patient Instructions below. Patient Instructions  Medication Instructions:  Please start Isosorbide 30 mg a day. Continue all other medications as listed.  Labwork: Please have blood work today (BMP, CBC and PT/INR)  Testing/Procedures: Your physician has requested that you have a cardiac catheterization. Cardiac catheterization is used to diagnose and/or treat various heart conditions. Doctors may recommend this procedure for a number of different reasons. The most common reason is to evaluate chest pain. Chest pain can be a symptom of coronary artery disease (CAD), and cardiac catheterization can show whether plaque is narrowing or blocking your heart's arteries. This procedure is also used to evaluate the valves, as well as measure the blood flow and oxygen levels in different parts of your heart. For further information please visit [email or web address]. Please follow instruction sheet, as given.  Follow-Up: Follow up with Dr Marlou Porch or NP/PA 2 to 3 weeks after your heart cath.  If you need a refill on your cardiac medications before your next appointment, please call your pharmacy.  Thank you for choosing [facility]!!         Signed, Candee Furbish, MD  07/27/2017 10:10 AM    [facility]  [address], [place], [place]  [address] Phone: [phone]; Fax: [phone]

## 2017-07-28 ENCOUNTER — Telehealth: Payer: Self-pay

## 2017-07-28 ENCOUNTER — Ambulatory Visit (INDEPENDENT_AMBULATORY_CARE_PROVIDER_SITE_OTHER): Payer: Medicare Other | Admitting: Pulmonary Disease

## 2017-07-28 ENCOUNTER — Encounter: Payer: Self-pay | Admitting: Pulmonary Disease

## 2017-07-28 VITALS — BP 140/72 | HR 78 | Ht 71.0 in | Wt 206.0 lb

## 2017-07-28 DIAGNOSIS — J411 Mucopurulent chronic bronchitis: Secondary | ICD-10-CM

## 2017-07-28 DIAGNOSIS — K219 Gastro-esophageal reflux disease without esophagitis: Secondary | ICD-10-CM | POA: Diagnosis not present

## 2017-07-28 DIAGNOSIS — R079 Chest pain, unspecified: Secondary | ICD-10-CM | POA: Diagnosis not present

## 2017-07-28 LAB — CBC
Hematocrit: 38.3 % (ref 37.5–51.0)
Hemoglobin: 13 g/dL (ref 13.0–17.7)
MCH: 29.5 pg (ref 26.6–33.0)
MCHC: 33.9 g/dL (ref 31.5–35.7)
MCV: 87 fL (ref 79–97)
Platelets: 269 10*3/uL (ref 150–379)
RBC: 4.4 x10E6/uL (ref 4.14–5.80)
RDW: 14.7 % (ref 12.3–15.4)
WBC: 7.7 10*3/uL (ref 3.4–10.8)

## 2017-07-28 LAB — PROTIME-INR
INR: 1 (ref 0.8–1.2)
Prothrombin Time: 10.6 s (ref 9.1–12.0)

## 2017-07-28 LAB — BASIC METABOLIC PANEL
BUN/Creatinine Ratio: 23 (ref 10–24)
BUN: 22 mg/dL (ref 8–27)
CO2: 21 mmol/L (ref 20–29)
Calcium: 9 mg/dL (ref 8.6–10.2)
Chloride: 105 mmol/L (ref 96–106)
Creatinine, Ser: 0.94 mg/dL (ref 0.76–1.27)
GFR calc Af Amer: 85 mL/min/{1.73_m2} (ref >59)
GFR calc non Af Amer: 73 mL/min/{1.73_m2} (ref >59)
Glucose: 83 mg/dL (ref 65–99)
Potassium: 4.4 mmol/L (ref 3.5–5.2)
Sodium: 142 mmol/L (ref 134–144)

## 2017-07-28 MED ORDER — FLUTICASONE FUROATE-VILANTEROL 100-25 MCG/INH IN AEPB: 1.0000 | INHALATION_SPRAY | Freq: Every day | RESPIRATORY_TRACT | 5 refills | Status: DC

## 2017-07-28 MED ORDER — FLUTICASONE FUROATE-VILANTEROL 100-25 MCG/INH IN AEPB: 1.0000 | INHALATION_SPRAY | Freq: Every day | RESPIRATORY_TRACT | 0 refills | Status: DC

## 2017-07-28 NOTE — Telephone Encounter (Signed)
Patient contacted pre-catheterization at [facility] scheduled for:  07/29/2017 @ 1200  Confirmed AM meds to be taken pre-cath with sip of water: Pt states he has an upset stomach if he takes his morning meds without food.  Advised Pt to take his ASA and atenolol only. Confirmed patient has responsible person to drive home post procedure and observe patient for 24 hours:  Yes-daughter Addl concerns:  Pt HOH

## 2017-07-28 NOTE — Patient Instructions (Addendum)
COPD exacerbation: Stop [employer] Advair, once you finish the sample start taking Breo 100 dose, 1 puff daily When you return in 2-4 weeks we will repeat a CBC with differential and serum IgE, these are blood tests that look to see if you have evidence of an allergy  Chest pain: Keep appointment for the heart catheterization tomorrow Because I suspect that this may be related to acid reflux I want you to take Prilosec in the morning and Pepcid at night  Acid reflux: Prilosec in the morning, Pepcid at night  We will see you back in 2-4 weeks

## 2017-07-28 NOTE — Progress Notes (Signed)
Subjective:    Patient ID: Tim [name], male    DOB: 1929-08-21, 83 y.o.   MRN: [id number]  Synopsis: Former patient of Dr. Gwenette Greet with COPD and a paralyzed R Hemidiaphragm as seen on fluoroscopy in 2007. He smoked 2-3 packs per day for 40 years, quit around 1980.  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    HPI Chief Complaint  Patient presents with  . Follow-up    pt c/o worsening wheezing, sob, prod cough with clear mucus.     Last Friday Ed said he woke up at 10pm at night with severe coughing spell, he had tightness in his chest that radiated to his left arm.  He said that he went to the ER and he was noted to have a severely elevated blood pressure. He had been prescribed prednisone for wheezing and coughing for a month prior.  He had also been treated with azithromycin.  He says that the wheezing and coughing is better now.  He was told at the ER to stop the prednisone.  He has a history of acid reflux, takes two pills a day, one in morning, one at night.  He says that he sometimes he has breakthrough symptoms often.       Past Medical History:  Diagnosis Date  . BPH (benign prostatic hypertrophy)   . CAD (coronary artery disease)    9 STEMI 2001, PCI, RCA, residual 100% circumflex  /   PCI for in-stent restenosis June, 2001  /  nuclear February, 2010, no ischemia  . Carotid artery disease ([place])    Doppler, June, 2011, 0-39% bilateral  . Cervical disc disease    Status post neurosurgery  . Colon polyps   . Ejection fraction    EF 55-65%, echo, 2009  . Elevated CPK    Chronic mild CPK elevation  . Emphysema    Mild  . Fatigue    Morning fatigue, August, 2011  . GERD (gastroesophageal reflux disease)   . Hyperlipidemia   . Hypertension   . Low back pain    February, 2013  . Paralyzed hemidiaphragm    Right  . Paralyzed hemidiaphragm    Chronic  . Statin intolerance    Elevated CPK in the past      Review of Systems    Constitutional: Negative for fatigue and fever.  HENT: Negative for postnasal drip and rhinorrhea.   Respiratory: Positive for cough, shortness of breath and wheezing.   Cardiovascular: Positive for chest pain. Negative for leg swelling.       Objective:   Physical Exam Vitals:   07/28/17 0915  BP: 140/72  Pulse: 78  SpO2: 95%  Weight: 206 lb (93.4 kg)  Height: 5\' 11"  (1.803 m)   RA  Gen: well appearing HENT: OP clear, TM's clear, neck supple PULM: Few crackles bases B, normal percussion CV: RRR, no mgr, trace edema GI: BS+, soft, nontender Derm: no cyanosis or rash Psyche: normal mood and affect   Chest imaging: 01/2016 CXR > granuloma left upper lobe  PFT: Lung function testing in 2005 showed clear airflow obstruction, FEV1 of 1.53 L (43% predicted), total lung capacity 62% predicted, DLCO 73% predicted.  Cardiology records reviewed from yesterday, it was noted that he had recently been seen at [facility] with some chest tightness.  He was briefly treated with heparin, treated with a Z-Pak.  It was felt that his shortness of breath was likely  related to his COPD but they plan to perform a LHC tomorrow.   He started taking Advair after the hospitalization.  He says there is no recent changes to his living environment.    CBC    Component Value Date/Time   WBC 7.7 07/27/2017 1020   WBC 6.9 12/27/2013 1300   RBC 4.40 07/27/2017 1020   RBC 4.40 12/27/2013 1300   HGB 13.0 07/27/2017 1020   HCT 38.3 07/27/2017 1020   PLT 269 07/27/2017 1020   MCV 87 07/27/2017 1020   MCH 29.5 07/27/2017 1020   MCH 29.3 12/27/2013 1300   MCHC 33.9 07/27/2017 1020   MCHC 34.0 12/27/2013 1300   RDW 14.7 07/27/2017 1020   LYMPHSABS 1.7 12/27/2013 1300   MONOABS 0.7 12/27/2013 1300   EOSABS 0.1 12/27/2013 1300   BASOSABS 0.0 12/27/2013 1300   Labs reviewed form his stay at [facility], his 07/22/2017 eosinopil count was 300 to 800 cells/dL  Chest x-ray from July 24, 2017 [facility] showed well aerated lungs, mild bibasilar scarring, no evidence of pleural effusion or hemothorax or pneumothorax, heart size within normal limits.     Assessment & Plan:   Mucopurulent chronic bronchitis (HCC)  Chest pain, unspecified type  Gastroesophageal reflux disease, esophagitis presence not specified  Discussion: I am concerned about added because he recently was hospitalized for what sounds like a COPD exacerbation with some chest pain.  I completely agree with the plans to perform a left heart catheterization tomorrow given his history of coronary artery disease in the chest pain that he experienced a week ago.  However, based on his history of suddenly waking up in the middle the night with a severe cough I wonder if this was brought on by acid reflux.  Clearly his recent COPD exacerbation is concerning as well the fact that his serum eosinophil count was elevated raises concern that he may have been around something that caused an allergic reaction for him, this is a relatively new finding for him as his eosinophil count has been normal in the past.  So moving forward I want to change him to an inhaled corticosteroid, once he finishes the Advair given to him in the hospital I will have him take Breo 1 puff daily.  I want him to come back in 2-3 weeks to have blood work to repeat eosinophil count and a serum IgE.  If those tests show concerning findings for an allergy he will need to have allergy testing of some sort.  Plan: COPD exacerbation: Stop Anoro Finish Advair, once you finish the sample start taking Breo 100 dose, 1 puff daily When you return in 2-4 weeks we will repeat a CBC with differential and serum IgE, these are blood tests that look to see if you have evidence of an allergy  Chest pain: Keep appointment for the heart catheterization tomorrow Because I suspect that this may be related to acid reflux I want you to take Prilosec in the morning  and Pepcid at night  Acid reflux: Prilosec in the morning, Pepcid at night  We will see you back in 2-4 weeks    Current Outpatient Medications:  .  acetaminophen (TYLENOL) 500 MG tablet, Take 500 mg every 8 (eight) hours as needed by mouth (pain). , Disp: , Rfl:  .  albuterol (PROVENTIL HFA;VENTOLIN HFA) 108 (90 BASE) MCG/ACT inhaler, Inhale 2 puffs into the lungs every 6 (six) hours as needed for wheezing or shortness of breath., Disp:  1 Inhaler, Rfl: 6 .  albuterol (PROVENTIL) (2.5 MG/3ML) 0.083% nebulizer solution, Take 3 mLs (2.5 mg total) by nebulization every 6 (six) hours as needed for wheezing or shortness of breath., Disp: 75 mL, Rfl: 12 .  aspirin 81 MG tablet, Take 1 tablet (81 mg total) by mouth daily., Disp: 30 tablet, Rfl: 6 .  atenolol (TENORMIN) 50 MG tablet, Take 1 tablet (50 mg total) by mouth daily., Disp: 90 tablet, Rfl: 2 .  azelastine (ASTELIN) 0.1 % nasal spray, Place 2 sprays into both nostrils 2 (two) times daily. Use in each nostril as directed, Disp: , Rfl:  .  doxycycline (PERIOSTAT) 20 MG tablet, Take 20 mg by mouth daily., Disp: , Rfl:  .  finasteride (PROSCAR) 5 MG tablet, Take 1 tablet by mouth daily., Disp: , Rfl:  .  furosemide (LASIX) 40 MG tablet, Take 40 mg daily as needed by mouth (chest pressure)., Disp: , Rfl:  .  isosorbide mononitrate (IMDUR) 30 MG 24 hr tablet, Take 1 tablet (30 mg total) daily by mouth., Disp: 30 tablet, Rfl: 6 .  omeprazole (PRILOSEC) 40 MG capsule, Take 1 capsule (40 mg total) by mouth daily., Disp: 90 capsule, Rfl: 3 .  tamsulosin (FLOMAX) 0.4 MG CAPS capsule, Take 0.4 mg by mouth as needed. For urinary symptoms, Disp: , Rfl:  .  umeclidinium-vilanterol (ANORO ELLIPTA) 62.5-25 MCG/INH AEPB, Inhale 1 puff into the lungs daily., Disp: 60 each, Rfl: 5

## 2017-07-29 ENCOUNTER — Encounter (HOSPITAL_COMMUNITY)
Admission: RE | Disposition: A | Discharge status: Home / Self Care | Payer: Self-pay | Source: Ambulatory Visit | Attending: Internal Medicine

## 2017-07-29 ENCOUNTER — Ambulatory Visit (HOSPITAL_COMMUNITY)
Admission: RE | Admit: 2017-07-29 | Discharge: 2017-07-29 | Disposition: A | Discharge status: Home / Self Care | Payer: Medicare Other | Source: Ambulatory Visit | Attending: Internal Medicine | Admitting: Internal Medicine

## 2017-07-29 DIAGNOSIS — I252 Old myocardial infarction: Secondary | ICD-10-CM | POA: Insufficient documentation

## 2017-07-29 DIAGNOSIS — J449 Chronic obstructive pulmonary disease, unspecified: Secondary | ICD-10-CM | POA: Diagnosis not present

## 2017-07-29 DIAGNOSIS — Z87891 Personal history of nicotine dependence: Secondary | ICD-10-CM | POA: Diagnosis not present

## 2017-07-29 DIAGNOSIS — I10 Essential (primary) hypertension: Secondary | ICD-10-CM | POA: Insufficient documentation

## 2017-07-29 DIAGNOSIS — K219 Gastro-esophageal reflux disease without esophagitis: Secondary | ICD-10-CM | POA: Diagnosis not present

## 2017-07-29 DIAGNOSIS — Y831 Surgical operation with implant of artificial internal device as the cause of abnormal reaction of the patient, or of later complication, without mention of misadventure at the time of the procedure: Secondary | ICD-10-CM | POA: Insufficient documentation

## 2017-07-29 DIAGNOSIS — I2582 Chronic total occlusion of coronary artery: Secondary | ICD-10-CM | POA: Insufficient documentation

## 2017-07-29 DIAGNOSIS — I200 Unstable angina: Secondary | ICD-10-CM

## 2017-07-29 DIAGNOSIS — T82855A Stenosis of coronary artery stent, initial encounter: Secondary | ICD-10-CM | POA: Diagnosis not present

## 2017-07-29 DIAGNOSIS — I25110 Atherosclerotic heart disease of native coronary artery with unstable angina pectoris: Secondary | ICD-10-CM | POA: Diagnosis present

## 2017-07-29 DIAGNOSIS — Z7982 Long term (current) use of aspirin: Secondary | ICD-10-CM | POA: Diagnosis not present

## 2017-07-29 DIAGNOSIS — E785 Hyperlipidemia, unspecified: Secondary | ICD-10-CM | POA: Insufficient documentation

## 2017-07-29 HISTORY — PX: LEFT HEART CATH AND CORONARY ANGIOGRAPHY: CATH118249

## 2017-07-29 SURGERY — LEFT HEART CATH AND CORONARY ANGIOGRAPHY
Anesthesia: LOCAL

## 2017-07-29 MED ORDER — SODIUM CHLORIDE 0.9% FLUSH: 3.0000 mL | Freq: Two times a day (BID) | INTRAVENOUS | Status: DC

## 2017-07-29 MED ORDER — HEPARIN SODIUM (PORCINE) 1000 UNIT/ML IJ SOLN
INTRAMUSCULAR | Status: AC
  Filled 2017-07-29: qty 1

## 2017-07-29 MED ORDER — SODIUM CHLORIDE 0.9 % IV SOLN: INTRAVENOUS | Status: DC

## 2017-07-29 MED ORDER — VERAPAMIL HCL 2.5 MG/ML IV SOLN
INTRAVENOUS | Status: AC
  Filled 2017-07-29: qty 2

## 2017-07-29 MED ORDER — MIDAZOLAM HCL 2 MG/2ML IJ SOLN
INTRAMUSCULAR | Status: AC
  Filled 2017-07-29: qty 2

## 2017-07-29 MED ORDER — SODIUM CHLORIDE 0.9% FLUSH: 3.0000 mL | INTRAVENOUS | Status: DC | PRN

## 2017-07-29 MED ORDER — SODIUM CHLORIDE 0.9 % IV SOLN: 250.0000 mL | INTRAVENOUS | Status: DC | PRN

## 2017-07-29 MED ORDER — ASPIRIN 81 MG PO CHEW: 81.0000 mg | CHEWABLE_TABLET | ORAL | Status: DC

## 2017-07-29 MED ORDER — HEPARIN SODIUM (PORCINE) 1000 UNIT/ML IJ SOLN
INTRAMUSCULAR | Status: DC | PRN
  Administered 2017-07-29: 5000 [IU] via INTRAVENOUS

## 2017-07-29 MED ORDER — FENTANYL CITRATE (PF) 100 MCG/2ML IJ SOLN
INTRAMUSCULAR | Status: AC
  Filled 2017-07-29: qty 2

## 2017-07-29 MED ORDER — FENTANYL CITRATE (PF) 100 MCG/2ML IJ SOLN
INTRAMUSCULAR | Status: DC | PRN
  Administered 2017-07-29: 25 ug via INTRAVENOUS

## 2017-07-29 MED ORDER — LIDOCAINE HCL (PF) 1 % IJ SOLN
INTRAMUSCULAR | Status: AC
  Filled 2017-07-29: qty 30

## 2017-07-29 MED ORDER — SODIUM CHLORIDE 0.9 % WEIGHT BASED INFUSION
1.0000 mL/kg/h | INTRAVENOUS | Status: DC
Start: 2017-07-29 — End: 2017-07-29

## 2017-07-29 MED ORDER — IOPAMIDOL (ISOVUE-370) INJECTION 76%
INTRAVENOUS | Status: AC
  Filled 2017-07-29: qty 100

## 2017-07-29 MED ORDER — VERAPAMIL HCL 2.5 MG/ML IV SOLN
INTRAVENOUS | Status: DC | PRN
  Administered 2017-07-29: 10 mL via INTRA_ARTERIAL

## 2017-07-29 MED ORDER — MIDAZOLAM HCL 2 MG/2ML IJ SOLN
INTRAMUSCULAR | Status: DC | PRN
  Administered 2017-07-29: 0.5 mg via INTRAVENOUS

## 2017-07-29 MED ORDER — LIDOCAINE HCL (PF) 1 % IJ SOLN
INTRAMUSCULAR | Status: DC | PRN
  Administered 2017-07-29: 2 mL

## 2017-07-29 MED ORDER — IOPAMIDOL (ISOVUE-370) INJECTION 76%
INTRAVENOUS | Status: DC | PRN
  Administered 2017-07-29: 60 mL via INTRA_ARTERIAL

## 2017-07-29 MED ORDER — HEPARIN (PORCINE) IN NACL 2-0.9 UNIT/ML-% IJ SOLN
INTRAMUSCULAR | Status: AC | PRN
  Administered 2017-07-29: 1000 mL

## 2017-07-29 MED ORDER — HEPARIN (PORCINE) IN NACL 2-0.9 UNIT/ML-% IJ SOLN
INTRAMUSCULAR | Status: AC
  Filled 2017-07-29: qty 1000

## 2017-07-29 MED ORDER — SODIUM CHLORIDE 0.9 % WEIGHT BASED INFUSION
3.0000 mL/kg/h | INTRAVENOUS | Status: AC
  Administered 2017-07-29: 3 mL/kg/h via INTRAVENOUS

## 2017-07-29 SURGICAL SUPPLY — 9 items

## 2017-07-29 NOTE — Interval H&P Note (Signed)
History and Physical Interval Note:  07/29/2017 11:12 AM  Tim [name]  has presented today for cardiac catheterization, with the diagnosis of unstable angina  The various methods of treatment have been discussed with the patient and family. After consideration of risks, benefits and other options for treatment, the patient has consented to  Procedure(s): LEFT HEART CATH AND CORONARY ANGIOGRAPHY (N/A) as a surgical intervention .  The patient's history has been reviewed, patient examined, no change in status, stable for surgery.  I have reviewed the patient's chart and labs.  Questions were answered to the patient's satisfaction.    Cath Lab Visit (complete for each Cath Lab visit)  Clinical Evaluation Leading to the Procedure:   ACS: Yes.   (unstable angina)  Non-ACS:  N/A  [name]

## 2017-07-29 NOTE — Discharge Instructions (Signed)

## 2017-07-29 NOTE — Brief Op Note (Signed)
BRIEF CARDIAC CATHETERIZATION NOTE   DATE: 07/29/2017 TIME: 12:00 PM  PATIENT:  Tim [name]  83 y.o. male  PRE-OPERATIVE DIAGNOSIS:  unstable angina  POST-OPERATIVE DIAGNOSIS: Multivessel coronary artery disease  PROCEDURE:  Procedure(s): LEFT HEART CATH AND CORONARY ANGIOGRAPHY (N/A)  SURGEON:  Surgeon(s) and Role:    * End, Harrell Gave, MD - Primary  FINDINGS: 1. Significant three-vessel coronary artery disease including diffuse mid LAD plaquing with up to 70% stenosis, as well as chronic total occlusions of the mid LCx and distal RCA. 2. Normal left ventricular filling pressure and contraction.  RECOMMENDATIONS: 1. Given three-vessel CAD, recommend cardiac surgery consultation.  If patient is felt to be a poor surgical candidate, I would recommend PCI to the mid LAD and medical therapy of the LCx and RCA disease. 2. Continue recently started isosorbide mononitrate, which may need to be up titrated based on symptoms and blood pressure. 3. Aggressive secondary prevention; consider retrial of statin versus PCSK9 inhibitor therapy.  Nelva Bush, MD [facility] Pager: [phone]

## 2017-07-30 ENCOUNTER — Encounter (HOSPITAL_COMMUNITY): Payer: Self-pay | Admitting: Internal Medicine

## 2017-08-03 ENCOUNTER — Encounter (INDEPENDENT_AMBULATORY_CARE_PROVIDER_SITE_OTHER): Payer: Medicare Other | Admitting: Ophthalmology

## 2017-08-03 ENCOUNTER — Telehealth: Payer: Self-pay | Admitting: Pulmonary Disease

## 2017-08-03 DIAGNOSIS — H353221 Exudative age-related macular degeneration, left eye, with active choroidal neovascularization: Secondary | ICD-10-CM

## 2017-08-03 DIAGNOSIS — H33301 Unspecified retinal break, right eye: Secondary | ICD-10-CM

## 2017-08-03 DIAGNOSIS — H43813 Vitreous degeneration, bilateral: Secondary | ICD-10-CM | POA: Diagnosis not present

## 2017-08-03 DIAGNOSIS — H353111 Nonexudative age-related macular degeneration, right eye, early dry stage: Secondary | ICD-10-CM

## 2017-08-03 MED ORDER — PREDNISONE 20 MG PO TABS: 20.0000 mg | ORAL_TABLET | Freq: Every day | ORAL | 0 refills | Status: DC

## 2017-08-03 NOTE — Telephone Encounter (Signed)
Spoke with [facility] and advised BQ was sending in an Rx for prednisone. Sent rx into [facility]. Nothing further is needed.

## 2017-08-03 NOTE — Telephone Encounter (Signed)
Prednisone 20mg daily x 5 days

## 2017-08-03 NOTE — Telephone Encounter (Signed)
Spoke with pt's daughter, [name]. States that pt is not feeling well. Reports coughing, wheezing, increased BP and SOB. Cough is producing clear mucus. Denies chest tightness or fever. Symptoms started yesterday. [name] would like BQ's recommendations.  BQ - please advise. Thanks!

## 2017-08-12 ENCOUNTER — Encounter (INDEPENDENT_AMBULATORY_CARE_PROVIDER_SITE_OTHER): Payer: Self-pay

## 2017-08-12 ENCOUNTER — Institutional Professional Consult (permissible substitution) (INDEPENDENT_AMBULATORY_CARE_PROVIDER_SITE_OTHER): Payer: Medicare Other | Admitting: Cardiothoracic Surgery

## 2017-08-12 ENCOUNTER — Other Ambulatory Visit: Payer: Self-pay

## 2017-08-12 ENCOUNTER — Other Ambulatory Visit: Payer: Self-pay | Admitting: *Deleted

## 2017-08-12 ENCOUNTER — Encounter: Payer: Self-pay | Admitting: Cardiothoracic Surgery

## 2017-08-12 VITALS — BP 144/83 | HR 71 | Ht 71.0 in | Wt 203.0 lb

## 2017-08-12 DIAGNOSIS — I25118 Atherosclerotic heart disease of native coronary artery with other forms of angina pectoris: Secondary | ICD-10-CM | POA: Diagnosis not present

## 2017-08-12 DIAGNOSIS — J42 Unspecified chronic bronchitis: Secondary | ICD-10-CM

## 2017-08-12 DIAGNOSIS — I2510 Atherosclerotic heart disease of native coronary artery without angina pectoris: Secondary | ICD-10-CM

## 2017-08-12 DIAGNOSIS — R918 Other nonspecific abnormal finding of lung field: Secondary | ICD-10-CM

## 2017-08-12 NOTE — Patient Instructions (Signed)
Coronary Artery Disease Treatment Decision Aid Introduction If you have coronary artery disease, you may have several treatment options. This document will review three of those options.  What are my treatment options? ? Stenting ? Medicines can treat and prevent heart disease. Taking medicines may be referred to as Optimal Medical Therapy.: This treatment involves procedures to widen blocked arteries by placing a piece of metal that looks like a coil or spring (stent) into blocked arteries. A small tube (catheter) is inserted into an artery in your groin, your wrist, or the fold of your arm. The catheter is used to guide the stent into place. This treatment is also called angioplasty or angioplasty with stent. Medicines are also needed. ? Heart Bypass Surgery ? Medicines can treat and prevent heart disease. Taking medicines may be referred to as Optimal Medical Therapy.: This treatment involves surgery to allow blood to flow past (bypass) blocked coronary arteries. A section of a blood vessel from another part of the body (usually the leg, arm, or chest) is removed and then inserted into the affected vessel. Like a bypass on a blocked road, this allows blood to bypass the blocked part of the coronary artery. This surgery may be done through a large incision in the front of the chest (open technique) or through an incision over the left chest area (minimally invasive technique). Medicines are also needed.  Who is eligible?  Good candidates may include: ? Stenting ? People with stable ischemic heart disease.: People who have tried medicine-only treatment but continue to have chest pain (angina) that affects quality of life. ? People with coronary stenosis that is not severe, is not in a high-risk area, or supplies only a small area of the heart.: People who have experienced unacceptable side effects of medicines. ? : People with severe or complete coronary artery blockage. ? : People who have  survived a sudden cardiac arrest and have a severe blockage in at least one coronary artery. ? : ? : ? Heart Bypass Surgery ? People with stable ischemic heart disease.: People who have tried medicine-only treatment but continue to have angina that affects quality of life. ? People with coronary stenosis that is not severe, is not in a high-risk area, or supplies only a small area of the heart.: People who have experienced unacceptable side effects of medicines. ? : People with severe or complete coronary artery blockage. ? : People who have survived a sudden cardiac arrest and have a severe blockage in at least one coronary artery. ? : People with multiple coronary artery blockages. ? : People with diabetes. People who have diabetes and multiple blockages may have better long-term success with surgery instead of stenting. What are the benefits?  Benefits may include: ? Stenting ? Prevention of heart attack and death.: Prevention of heart attack and death. ? Not needing any procedures or surgeries.: Ability to have an angiogram at the same time as this procedure to find any other coronary artery blockages. ? : Quick and complete relief of chest pain (angina) symptoms. ? : Needing to take fewer medicines. ? : ? Heart Bypass Surgery ? Prevention of heart attack and death.: Prevention of heart attack and death. ? Not needing any procedures or surgeries.: Improved chance of living longer, for people who have a blockage in the left main coronary artery. ? : Quick and complete relief of chest pain (angina) symptoms. ? : Needing to take fewer medicines. ? : What are the risks?  Risks may  include: ? Stenting ? Side effects from nitrates, such as headache, dizziness, and nausea.: Bleeding in the groin (hematoma) or abdomen (retroperitoneal bleeding). ? Side effects from blood pressure medicines, such as cough, dizziness, or feeling tired.: Return of the blockage (restenosis). ? Side effects  from anticoagulant medicines, such as bruising or bleeding.: Needing emergency coronary artery bypass grafting. ? Allergic reaction.: Kidney injury. ? : Infection. ? : Possibility of a reaction to anesthetic medicines. ? : Bleeding caused by medicines that are taken after the procedure. ? : Heart attack. This is rare. ? : Stroke or blood clot. This is rare. ? : ? : ? Heart Bypass Surgery ? Side effects from nitrates, such as headache, dizziness, and nausea.: Bleeding. ? Side effects from blood pressure medicines, such as cough, dizziness, or feeling tired.: Heart attack. ? Side effects from anticoagulant medicines, such as bruising or bleeding.: Stroke or blood clot. ? Allergic reaction.: Kidney injury. ? : Infection. ? : Possibility of a reaction to anesthetic medicines. ? : Scarring. ? : Possibility of pain and discomfort after the surgery. ? : ? : ? : What preparation is needed?  Preparation may include: ? Stenting ? None: Physical evaluation. This may involve lab work and X-rays. ? : Adjusting medicines that you take or taking new medicines, depending on other medical conditions that you have. ? Heart Bypass Surgery ? None: Physical evaluation. This may involve lab work and X-rays. ? : Adjusting medicines that you take or taking new medicines, depending on other medical conditions that you have. The exact tests and frequency of office visits will be decided by your health care provider based on your individual condition. What are the restrictions after?  Restrictions may include: ? Stenting ? Needing to avoid activities that have a high risk of injury, such as contact sports or working with sharp objects. These medicines can cause internal bleeding.: Needing to avoid activities for several months that have a high risk of injury, such as contact sports or working with sharp objects. These medicines can cause internal bleeding. ? : Not lifting anything that is heavier than 10 lb  (4.5 kg) for 3-7 days after the procedure. ? : Not driving for 3-7 days after the procedure. ? : Not doing activities that require a lot of energy for 3-7 days after the procedure. ? Heart Bypass Surgery ? Needing to avoid activities that have a high risk of injury, such as contact sports or working with sharp objects. These medicines can cause internal bleeding.: Avoiding crossing your legs or sitting for long periods at a time, if incisions were made in your legs. ? : Avoiding lifting, pushing, or pulling anything that is heavier than 10 lb (4.5 kg) for at least 6 weeks after surgery. ? : Not driving until your provider approves, usually at least 6 weeks after surgery. ? : What can I expect from recovery?  You may experience: ? Stenting ? Side effects such as drowsiness or lack of energy. Side effects vary.: Recovery within a week, usually faster than heart bypass surgery. ? Regular follow-up visits to check how well medicines are working.: ? Heart Bypass Surgery ? Side effects such as drowsiness or lack of energy. Side effects vary.: Recovery in 4-6 weeks or longer. Recovery time varies. ? Regular follow-up visits to check how well medicines are working.: Pain that may last for several weeks. This varies. What is the impact to quality of life?  Impact to quality of life may include: ?  Stenting ? Living with certain side effects.: Needing to take blood thinners for several months after the procedure. During this time, you will need to avoid activities that have a high risk of injury. ? Needing to avoid activities that have a high risk of injury such as contact sports or working with sharp objects. Medications can increase risk for bleeding.: Making time for follow-up visits. ? Making time for regular lab tests and follow-up visits.: Taking a few days off from work to recover. ? : ? Heart Bypass Surgery ? Living with certain side effects.: Taking up to 6 weeks off from work and strenuous  activities to recover. ? Needing to avoid activities that have a high risk of injury such as contact sports or working with sharp objects. Medications can increase risk for bleeding.: Having a scar from surgery. ? Making time for regular lab tests and follow-up visits.: Making time for follow-up visits. ? : Limitations on lifting and driving, usually for about 6 weeks. This may impact your regular schedule, including work. The frequency of office visits, therapy, and treatments will be decided by your health care provider and care team based on your individual condition. What is the cost?  Costs may include: ? Stenting - $$ ? Ongoing medicines.: Procedures and recovery. ? Heart Bypass Surgery - $$$ ? Ongoing medicines.: Procedures and recovery. Exact costs will vary based on your location and insurance plan. Contact your insurance company to find out what is covered. Questions to ask your health care provider:  Ask: ? Stenting ? Am I at risk for a heart attack?: Am I at risk for a heart attack? ? How will I know if the medicines are working?: Am I at risk for complications from this treatment? ? What can I do to lower my risk of a heart attack?: What can I do to lower my risk of a heart attack? ? Am I eligible for this option?: Am I eligible for this option? ? What are my risks if I choose this option?: What are my personal risks? ? What are my personal risks?: ? Heart Bypass Surgery ? Am I at risk for a heart attack?: Am I at risk for a heart attack? ? How will I know if the medicines are working?: Am I at risk for complications from surgery? ? What can I do to lower my risk of a heart attack?: What can I do to lower my risk of a heart attack? ? Am I eligible for this option?: Am I eligible for this option? ? What are my risks if I choose this option?: What are my personal risks? ? What are my personal risks?: Follow these instructions at home: Review these options and decide which  one may be right for you.  My decision: ? Medicines Only ? This is right for me:: ? This is not right for me:: ? I am unsure right now:: ? Stenting ? This is right for me:: ? This is not right for me:: ? I am unsure right now:: ? Heart Bypass Surgery ? This is right for me:: ? This is not right for me:: ? I am unsure right now::  This information is not intended to replace advice given to you by your health care provider. Make sure you discuss any questions you have with your health care provider. Document Released: 07/23/2016 Document Revised: 07/23/2016 Document Reviewed: 07/23/2016 Elsevier Interactive Patient Education  2018 [employer].    Coronary Artery Bypass Grafting, Care After  This sheet gives you information about how to care for yourself after your procedure. Your health care provider may also give you more specific instructions. If you have problems or questions, contact your health care provider. What can I expect after the procedure? After the procedure, it is common to have:  Nausea and a lack of appetite.  Constipation.  Weakness and fatigue.  Depression or irritability.  Pain or discomfort in your incision areas.  Follow these instructions at home: Medicines  Take over-the-counter and prescription medicines only as told by your health care provider. Do not stop taking medicines or start any new medicines without approval from your health care provider.  If you were prescribed an antibiotic medicine, take it as told by your health care provider. Do not stop taking the antibiotic even if you start to feel better.  Do not drive or use heavy machinery while taking prescription pain medicine. Incision care  Follow instructions from your health care provider about how to take care of your incisions. Make sure you: ? Wash your hands with soap and water before you change your bandage (dressing). If soap and water are not available, use hand  sanitizer. ? Change your dressing as told by your health care provider. ? Leave stitches (sutures), skin glue, or adhesive strips in place. These skin closures may need to stay in place for 2 weeks or longer. If adhesive strip edges start to loosen and curl up, you may trim the loose edges. Do not remove adhesive strips completely unless your health care provider tells you to do that.  Keep incision areas clean, dry, and protected.  Check your incision areas every day for signs of infection. Check for: ? More redness, swelling, or pain. ? More fluid or blood. ? Warmth. ? Pus or a bad smell.  If incisions were made in your legs: ? Avoid crossing your legs. ? Avoid sitting for long periods of time. Change positions every 30 minutes. ? Raise (elevate) your legs when you are sitting. Bathing  Do not take baths, swim, or use a hot tub until your health care provider approves.  Only take sponge baths. Pat the incisions dry. Do not rub incisions with a washcloth or towel.  Ask your health care provider when you can shower. Eating and drinking  Eat foods that are high in fiber, such as raw fruits and vegetables, whole grains, beans, and nuts. Meats should be lean cut. Avoid canned, processed, and fried foods. This can help prevent constipation and is a recommended part of a heart-healthy diet.  Drink enough fluid to keep your urine clear or pale yellow.  Limit alcohol intake to no more than 1 drink a day for nonpregnant women and 2 drinks a day for men. One drink equals 12 oz of beer, 5 oz of wine, or 1 oz of hard liquor. Activity  Rest and limit your activity as told by your health care provider. You may be instructed to: ? Stop any activity right away if you have chest pain, shortness of breath, irregular heartbeats, or dizziness. Get help right away if you have any of these symptoms. ? Move around frequently for short periods or take short walks as directed by your health care provider.  Gradually increase your activities. You may need physical therapy or cardiac rehabilitation to help strengthen your muscles and build your endurance. ? Avoid lifting, pushing, or pulling anything that is heavier than 10 lb (4.5 kg) for at least 6 weeks or as  told by your health care provider.  Do not drive until your health care provider approves.  Ask your health care provider when you may return to work.  Ask your health care provider when you may resume sexual activity. General instructions  Do not use any products that contain nicotine or tobacco, such as cigarettes and e-cigarettes. If you need help quitting, ask your health care provider.  Take 2-3 deep breaths every few hours during the day, while you recover. This helps expand your lungs and prevent complications like pneumonia after surgery.  If you were given a device called an incentive spirometer, use it several times a day to practice deep breathing. Support your chest with a pillow or your arms when you take deep breaths or cough.  Wear compression stockings as told by your health care provider. These stockings help to prevent blood clots and reduce swelling in your legs.  Weigh yourself every day. This helps identify if your body is holding (retaining) fluid that may make your heart and lungs work harder.  Keep all follow-up visits as told by your health care provider. This is important. Contact a health care provider if:  You have more redness, swelling, or pain around any incision.  You have more fluid or blood coming from any incision.  Any incision feels warm to the touch.  You have pus or a bad smell coming from any incision  You have a fever.  You have swelling in your ankles or legs.  You have pain in your legs.  You gain 2 lb (0.9 kg) or more a day.  You are nauseous or you vomit.  You have diarrhea. Get help right away if:  You have chest pain that spreads to your jaw or arms.  You are short of  breath.  You have a fast or irregular heartbeat.  You notice a "clicking" in your breastbone (sternum) when you move.  You have numbness or weakness in your arms or legs.  You feel dizzy or light-headed. Summary  After the procedure, it is common to have pain or discomfort in the incision areas.  Do not take baths, swim, or use a hot tub until your health care provider approves.  Gradually increase your activities. You may need physical therapy or cardiac rehabilitation to help strengthen your muscles and build your endurance.  Weigh yourself every day. This helps identify if your body is holding (retaining) fluid that may make your heart and lungs work harder. This information is not intended to replace advice given to you by your health care provider. Make sure you discuss any questions you have with your health care provider. Document Released: 03/20/2005 Document Revised: 07/20/2016 Document Reviewed: 07/20/2016 Elsevier Interactive Patient Education  [employer].

## 2017-08-12 NOTE — Progress Notes (Signed)
[address]       [place] [address]             [phone]                    Hinton L Bernhart [place] Medical Record #[id number] Date of Birth: May 26, 1929  Referring: Nelva Bush, MD Primary Care: Raelene Bott, MD  Chief Complaint:    Chief Complaint  Patient presents with  . New Patient (Initial Visit)    CABG evaluation, cath 07/29/2017    History of Present Illness:    Tim [name] 83 y.o. male is seen in the office  today for consideration of coronary artery bypass grafting.  Patient has known coronary artery disease having had stents placed in the past.  3-4 weeks ago he was admitted to [facility] with acute exacerbation of shortness of breath during the night.  He was treated and released and made arrangements to see Dr. Marlou Porch soon afterwards.  Cardiac catheterization was recommended and performed November 15. The patient notes episodic shortness of breath with chest discomfort sometimes radiating into the left arm.  This is occurred at night and also with exertion.  Patient continues to work in his home shop on a daily basis, but does limit his activities notes that he can no longer climb under the house. Patient has history of multiple visits for bladder outlet obstruction prostatitis and chronic obstructive pulmonary disease with acute exacerbations. Former patient of Dr. Gwenette Greet with COPD and a paralyzed R Hemidiaphragm as seen on fluoroscopy in 2007. Lung function testing in 2005 showed clear airflow obstruction, FEV1 of 1.53 L (43% predicted), total lung capacity 62% predicted, DLCO 73% predicted. He smoked 2-3 packs per day for 40 years, quit around 1980.  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   Small peripheral pulmonary nodule, thought to be a granuloma was noted in November 2015 on chest x-ray, not noted on film 02/05/2017. Current Activity/ Functional Status:  Patient is independent with  mobility/ambulation, transfers, ADL's, IADL's.   Zubrod Score: At the time of surgery this patient's most appropriate activity status/level should be described as: []     0    Normal activity, no symptoms []     1    Restricted in physical strenuous activity but ambulatory, able to do out light work [x]     2    Ambulatory and capable of self care, unable to do work activities, up and about               >50 % of waking hours                              []     3    Only limited self care, in bed greater than 50% of waking hours []     4    Completely disabled, no self care, confined to bed or chair []     5    Moribund   Past Medical History:  Diagnosis Date  . BPH (benign prostatic hypertrophy)   . CAD (coronary artery disease)    9 STEMI 2001, PCI, RCA, residual 100% circumflex  /   PCI for in-stent restenosis June, 2001  /  nuclear February, 2010, no ischemia  . Carotid artery disease ([place])    Doppler, June, 2011, 0-39% bilateral  . Cervical disc disease  Status post neurosurgery  . Colon polyps   . Ejection fraction    EF 55-65%, echo, 2009  . Elevated CPK    Chronic mild CPK elevation  . Emphysema    Mild  . Fatigue    Morning fatigue, August, 2011  . GERD (gastroesophageal reflux disease)   . Hyperlipidemia   . Hypertension   . Low back pain    February, 2013  . Paralyzed hemidiaphragm    Right  . Paralyzed hemidiaphragm    Chronic  . Statin intolerance    Elevated CPK in the past    Past Surgical History:  Procedure Laterality Date  . BREAST SURGERY     right  . CERVICAL DISC SURGERY    . LEFT HEART CATH AND CORONARY ANGIOGRAPHY N/A 07/29/2017   Procedure: LEFT HEART CATH AND CORONARY ANGIOGRAPHY;  Surgeon: Nelva Bush, MD;  Location: [facility];  Service: Cardiovascular;  Laterality: N/A;  . SKIN CANCER EXCISION      Family History  Problem Relation Age of Onset  . Diabetes Mother   . Other Father   . Diabetes Sister   . Kidney disease  Brother   . Cancer Brother   . Other Sister     Social History   Socioeconomic History  . Marital status: Married    Spouse name: Not on file  . Number of children: Not on file  . Years of education: Not on file  . Highest education level: Not on file  Social Needs  . Financial resource strain: Not on file  . Food insecurity - worry: Not on file  . Food insecurity - inability: Not on file  . Transportation needs - medical: Not on file  . Transportation needs - non-medical: Not on file  Occupational History  . Occupation: retired Games developer  Tobacco Use  . Smoking status: Former Smoker    Packs/day: 3.00    Years: 40.00    Pack years: 120.00    Types: Cigarettes    Last attempt to quit: 09/15/1975    Years since quitting: 41.9  . Smokeless tobacco: Never Used  Substance and Sexual Activity  . Alcohol use: Not on file  . Drug use: Not on file  . Sexual activity: Not on file  Other Topics Concern  . Not on file  Social History Narrative  . Not on file    Social History   Tobacco Use  Smoking Status Former Smoker  . Packs/day: 3.00  . Years: 40.00  . Pack years: 120.00  . Types: Cigarettes  . Last attempt to quit: 09/15/1975  . Years since quitting: 41.9  Smokeless Tobacco Never Used    Social History   Substance and Sexual Activity  Alcohol Use Not on file     Allergies  Allergen Reactions  . Statins Other (See Comments)    Severe leg cramps with multiple statins    Current Outpatient Medications  Medication Sig Dispense Refill  . Acetaminophen 500 MG coapsule Take 500 mg every 8 (eight) hours as needed by mouth (pain).     [name] albuterol (PROVENTIL HFA;VENTOLIN HFA) 108 (90 BASE) MCG/ACT inhaler Inhale 2 puffs into the lungs every 6 (six) hours as needed for wheezing or shortness of breath. 1 Inhaler 6  . albuterol (PROVENTIL) (2.5 MG/3ML) 0.083% nebulizer solution Take 3 mLs (2.5 mg total) by nebulization every 6 (six) hours as needed for wheezing or  shortness of breath. 75 mL 12  . aspirin 81 MG tablet  Take 1 tablet (81 mg total) by mouth daily. 30 tablet 6  . atenolol (TENORMIN) 50 MG tablet Take 1 tablet (50 mg total) by mouth daily. 90 tablet 2  . doxycycline (PERIOSTAT) 20 MG tablet Take 20 mg by mouth daily.    . finasteride (PROSCAR) 5 MG tablet Take 1 tablet by mouth daily.    . fluticasone furoate-vilanterol (BREO ELLIPTA) 100-25 MCG/INH AEPB Inhale 1 puff daily into the lungs. 1 each 0  . furosemide (LASIX) 40 MG tablet Take 40 mg daily as needed by mouth (chest pressure).    . isosorbide mononitrate (IMDUR) 30 MG 24 hr tablet Take 1 tablet (30 mg total) daily by mouth. 30 tablet 6  . omeprazole (PRILOSEC) 40 MG capsule Take 1 capsule (40 mg total) by mouth daily. 90 capsule 3  . predniSONE (DELTASONE) 20 MG tablet Take 1 tablet (20 mg total) daily with breakfast by mouth. 5 tablet 0  . tamsulosin (FLOMAX) 0.4 MG CAPS capsule Take 0.4 mg by mouth as needed. For urinary symptoms    . azelastine (ASTELIN) 0.1 % nasal spray Place 2 sprays into both nostrils 2 (two) times daily. Use in each nostril as directed     No current facility-administered medications for this visit.     Pertinent items are noted in HPI.   Review of Systems:     Cardiac Review of Systems: Y or N  Chest Pain [  y  ]  Resting SOB [n   ] Exertional SOB  [ y ]  Vertell Limber Florencio.Farrier  ]   Pedal Edema [ n  ]    Palpitations [ n ] Syncope  [ n ]   Presyncope [ n  ]  General Review of Systems: [Y] = yes [  ]=no Constitional: recent weight change [n ];  Wt loss over the last 3 months [   ] anorexia [  ]; fatigue Blue.Reese  ]; nausea [ n ]; night sweats [n  ]; fever [ n ]; or chills [ n ];          Dental: poor dentition[  ]; Last Dentist visit:   Eye : blurred vision [  ]; diplopia [   ]; vision changes [  ];  Amaurosis fugax[  ]; Resp: cough Blue.Reese  ];  wheezing[y  ];  hemoptysis[ n ]; shortness of breath[y  ]; paroxysmal nocturnal dyspnea[ y ]; dyspnea on exertion[ y ]; or  orthopnea[y  ];  GI:  gallstones[  ], vomiting[  ];  dysphagia[  ]; melena[  ];  hematochezia [  ]; heartburn[  ];   Hx of  Colonoscopy[  ]; GU: kidney stones [  ]; hematuria[y  ];   dysuria [  ];  nocturia[  ];  history of     obstruction [  ]; urinary frequency [  ]             Skin: rash, swelling[  ];, hair loss[  ];  peripheral edema[  ];  or itching[  ]; Musculosketetal: myalgias[  ];  joint swelling[  ];  joint erythema[  ];  joint pain[  ];  back pain[  ];  Heme/Lymph: bruising[n  ];  bleeding[  ];  anemia[ n ];  Neuro: TIA[  ];  headaches[  ];  stroke[  ];  vertigo[  ];  seizures[n ];   paresthesias[  ];  difficulty walking[ y ];  Psych:depression[  ]; anxiety[  ];  Endocrine: diabetes[  ];  thyroid dysfunction[  ];  Immunizations: Flu up to date [ y ]; Pneumococcal up to date [ y ];  Other:  Physical Exam: BP (!) 144/83 (BP Location: Right Arm, Patient Position: Sitting, Cuff Size: Large)   Pulse 71   Ht 5\' 11"  (1.803 m)   Wt 203 lb (92.1 kg)   SpO2 98%   BMI 28.31 kg/m   PHYSICAL EXAMINATION: General appearance: alert, cooperative, appears stated age and no distress Head: Normocephalic, without obvious abnormality, atraumatic Neck: no adenopathy, no carotid bruit, no JVD, supple, symmetrical, trachea midline and thyroid not enlarged, symmetric, no tenderness/mass/nodules Lymph nodes: Cervical, supraclavicular, and axillary nodes normal. Resp: diminished breath sounds bibasilar Back: symmetric, no curvature. ROM normal. No CVA tenderness. Cardio: regular rate and rhythm, S1, S2 normal, no murmur, click, rub or gallop GI: soft, non-tender; bowel sounds normal; no masses,  no organomegaly Extremities: extremities normal, atraumatic, no cyanosis or edema and no ulcers, gangrene or trophic changes Neurologic: Grossly normal    6 Minute Walk Test Results  Patient: MADDIX HEINZ Date:  08/12/2017   Supplemental O2 during test? no      Baseline   End  Time             10:20    10:24 Heartrate  71    88 Dyspnea  S    3 Fatigue  S    3 O2 sat   98%    96% Blood pressure 144/83    185/85   Patient ambulated at a NORMAL SLOW pace for a total distance of 588 feet with no stops.  Ambulation was limited primarily due to SOB and hip/knee discomfort  Overall the test was tolerated well    Diagnostic Studies & Laboratory data:     Recent Radiology Findings:   No results found. Old chest x-ray was reviewed question of left upper lobe lung nodule on some of the older films.    Recent Lab Findings: Lab Results  Component Value Date   WBC 7.7 07/27/2017   HGB 13.0 07/27/2017   HCT 38.3 07/27/2017   PLT 269 07/27/2017   GLUCOSE 83 07/27/2017   ALT 18 12/27/2013   AST 17 12/27/2013   NA 142 07/27/2017   K 4.4 07/27/2017   CL 105 07/27/2017   CREATININE 0.94 07/27/2017   BUN 22 07/27/2017   CO2 21 07/27/2017   TSH 1.10 10/31/2012   INR 1.0 07/27/2017    Most recent echo cardiogram is dated February 2014. Patient has had no documented pulmonary function studies  LEFT HEART CATH AND CORONARY ANGIOGRAPHY  Conclusion   Conclusions: 1. Significant three-vessel coronary artery disease, including sequential 70% and 50% proximal/mid LAD stenoses as well as chronic total occlusion of the proximal/mid LCx and distal RCA. 2. Moderate to severe in-stent restenosis in the mid/distal RCA. 3. Normal left ventricular contraction and filling pressure.  Recommendations: 1. Cardiac surgery consultation, given significant three vessel coronary artery disease. OM3 and rPDA are supplied by collaterals. 2. If patient is not a surgical candidate, PCI to proximal/mid LAD could be considered, with continued medical management of chronic total occlusions of the LCx and RCA. 3. Aggressive secondary prevention. Consider retrial of statin versus evaluation for PCSK9 inhibitor therapy. 4. Continue atenolol and isosorbide mononitrate, to be uptitrated as tolerated.                 Nelva Bush, MD [place] Pager: [phone]  Coronary Findings   Diagnostic  Dominance:  Right  Left Main  Vessel is large.  Mid LM to Ost LAD lesion 30% stenosed  Mid LM to Ost LAD lesion is 30% stenosed.  Left Anterior Descending  Vessel is large.  Prox LAD lesion 70% stenosed  Prox LAD lesion is 70% stenosed. The lesion is eccentric. The lesion is mildly calcified.  Mid LAD lesion 50% stenosed  Mid LAD lesion is 50% stenosed. The lesion is eccentric.  First Diagonal Branch  Vessel is moderate in size.  Second Diagonal Branch  Vessel is small in size.  Ramus Intermedius  Vessel is moderate in size. Vessel is angiographically normal.  Left Circumflex  Vessel is moderate in size.  Prox Cx to Mid Cx lesion 100% stenosed  Prox Cx to Mid Cx lesion is 100% stenosed. The lesion is chronically occluded with left-to-left collateral flow.  First Obtuse Marginal Branch  Vessel is small in size.  Second Obtuse Marginal Branch  Vessel is small in size.  Third Obtuse Marginal Branch  Vessel is moderate in size.  Collaterals  3rd Mrg filled by collaterals from 1st Diag.    Right Coronary Artery  Prox RCA lesion 50% stenosed  Prox RCA lesion is 50% stenosed.  Mid RCA to Dist RCA lesion 50% stenosed  Mid RCA to Dist RCA lesion is 50% stenosed. The lesion was previously treated.  Dist RCA lesion 100% stenosed  Dist RCA lesion is 100% stenosed. The lesion is chronically occluded with left-to-right collateral flow.  Acute Marginal Branch  Collaterals  Acute Mrg filled by collaterals from Dist LAD.    Acute Mrg lesion 100% stenosed  Acute Mrg lesion is 100% stenosed. The lesion is chronically occluded with left-to-right collateral flow.  Right Posterior Descending Artery  Collaterals  RPDA filled by collaterals from 2nd Sept.   Hemo Data    Most Recent Value  AO Systolic Pressure 161 mmHg  AO Diastolic Pressure 67 mmHg  AO Mean 096 mmHg  LV Systolic  Pressure 045 mmHg  LV Diastolic Pressure 7 mmHg  LV EDP 11 mmHg  Arterial Occlusion Pressure Extended Systolic Pressure 409 mmHg  Arterial Occlusion Pressure Extended Diastolic Pressure 63 mmHg  Arterial Occlusion Pressure Extended Mean Pressure 95 mmHg  Left Ventricular Apex Extended Systolic Pressure 811 mmHg  Left Ventricular Apex Extended Diastolic Pressure 7 mmHg  Left Ventricular Apex Extended EDP Pressure 13 mmHg      Assessment / Plan:    Three-vessel coronary artery disease, with recent admission for unstable angina versus COPD exacerbation-patient referred for consideration of coronary artery bypass grafting because of his in-stent stenosis of the right coronary artery and proximal LAD lesion and total occlusion of the circumflex.  The patient has known underlying COPD, question of a paralyzed diaphragm on the right.  In spite of his age of 21 he remains relatively active around his house.  I discussed coronary artery bypass grafting with the patient his daughter and son, although he does carry a somewhat increased risk due to age it is not prohibitive in his functional status is such that he could achieve a reasonable recovery.  At this point the patient is not sure if he wants to proceed with invasive surgery but is agreeable with further evaluation that may be pertinent to a decision including current pulmonary function studies and CT of the chest to rule out definitively pulmonary nodules.  He will discussed the recommendations with his family and return to see me next week.  We will have interventional cardiology again review  his films as far as angioplasty options.  Known underlying COPD-history of smoking and asbestos exposure in the past Known prostatic hypertrophy with intermittent bladder outlet obstruction   I  spent 40 minutes counseling the patient face to face and 50% or more the  time was spent in counseling and coordination of care. The total time spent in the  appointment was 60 minutes.  Grace Isaac MD      [address] [place] [address] Office [phone]   Beeper [phone]  08/12/2017 9:55 AM

## 2017-08-13 ENCOUNTER — Ambulatory Visit
Admission: RE | Admit: 2017-08-13 | Discharge: 2017-08-13 | Disposition: A | Discharge status: Home / Self Care | Payer: Medicare Other | Source: Ambulatory Visit | Attending: Cardiothoracic Surgery | Admitting: Cardiothoracic Surgery

## 2017-08-13 ENCOUNTER — Ambulatory Visit (HOSPITAL_COMMUNITY)
Admission: RE | Admit: 2017-08-13 | Discharge: 2017-08-13 | Disposition: A | Discharge status: Home / Self Care | Payer: Medicare Other | Source: Ambulatory Visit | Attending: Cardiothoracic Surgery | Admitting: Cardiothoracic Surgery

## 2017-08-13 DIAGNOSIS — Z841 Family history of disorders of kidney and ureter: Secondary | ICD-10-CM | POA: Diagnosis not present

## 2017-08-13 DIAGNOSIS — N401 Enlarged prostate with lower urinary tract symptoms: Secondary | ICD-10-CM | POA: Diagnosis not present

## 2017-08-13 DIAGNOSIS — Z7951 Long term (current) use of inhaled steroids: Secondary | ICD-10-CM | POA: Insufficient documentation

## 2017-08-13 DIAGNOSIS — I2510 Atherosclerotic heart disease of native coronary artery without angina pectoris: Secondary | ICD-10-CM | POA: Diagnosis present

## 2017-08-13 DIAGNOSIS — E785 Hyperlipidemia, unspecified: Secondary | ICD-10-CM | POA: Insufficient documentation

## 2017-08-13 DIAGNOSIS — Z888 Allergy status to other drugs, medicaments and biological substances status: Secondary | ICD-10-CM | POA: Diagnosis not present

## 2017-08-13 DIAGNOSIS — Z87891 Personal history of nicotine dependence: Secondary | ICD-10-CM | POA: Insufficient documentation

## 2017-08-13 DIAGNOSIS — J439 Emphysema, unspecified: Secondary | ICD-10-CM | POA: Insufficient documentation

## 2017-08-13 DIAGNOSIS — K219 Gastro-esophageal reflux disease without esophagitis: Secondary | ICD-10-CM | POA: Diagnosis not present

## 2017-08-13 DIAGNOSIS — Z79899 Other long term (current) drug therapy: Secondary | ICD-10-CM | POA: Diagnosis not present

## 2017-08-13 DIAGNOSIS — Z77090 Contact with and (suspected) exposure to asbestos: Secondary | ICD-10-CM | POA: Insufficient documentation

## 2017-08-13 DIAGNOSIS — I252 Old myocardial infarction: Secondary | ICD-10-CM | POA: Insufficient documentation

## 2017-08-13 DIAGNOSIS — I10 Essential (primary) hypertension: Secondary | ICD-10-CM | POA: Insufficient documentation

## 2017-08-13 DIAGNOSIS — Z833 Family history of diabetes mellitus: Secondary | ICD-10-CM | POA: Insufficient documentation

## 2017-08-13 DIAGNOSIS — N138 Other obstructive and reflux uropathy: Secondary | ICD-10-CM | POA: Diagnosis not present

## 2017-08-13 DIAGNOSIS — R918 Other nonspecific abnormal finding of lung field: Secondary | ICD-10-CM

## 2017-08-13 DIAGNOSIS — Z955 Presence of coronary angioplasty implant and graft: Secondary | ICD-10-CM | POA: Diagnosis not present

## 2017-08-13 DIAGNOSIS — Z7982 Long term (current) use of aspirin: Secondary | ICD-10-CM | POA: Diagnosis not present

## 2017-08-13 LAB — PULMONARY FUNCTION TEST
DL/VA % pred: 66 %
DL/VA: 3.08 ml/min/mmHg/L
DLCO cor % pred: 45 %
DLCO cor: 15.34 ml/min/mmHg
DLCO unc % pred: 43 %
DLCO unc: 14.6 ml/min/mmHg
FEF 25-75 Post: 1.86 L/sec
FEF 25-75 Pre: 1.36 L/sec
FEF2575-%Change-Post: 36 %
FEF2575-%Pred-Post: 107 %
FEF2575-%Pred-Pre: 78 %
FEV1-%Change-Post: 4 %
FEV1-%Pred-Post: 89 %
FEV1-%Pred-Pre: 85 %
FEV1-Post: 2.42 L
FEV1-Pre: 2.32 L
FEV1FVC-%Change-Post: 2 %
FEV1FVC-%Pred-Pre: 99 %
FEV6-%Change-Post: 3 %
FEV6-%Pred-Post: 93 %
FEV6-%Pred-Pre: 90 %
FEV6-Post: 3.38 L
FEV6-Pre: 3.27 L
FEV6FVC-%Change-Post: 0 %
FEV6FVC-%Pred-Post: 106 %
FEV6FVC-%Pred-Pre: 105 %
FVC-%Change-Post: 2 %
FVC-%Pred-Post: 87 %
FVC-%Pred-Pre: 85 %
FVC-Post: 3.4 L
FVC-Pre: 3.32 L
Post FEV1/FVC ratio: 71 %
Post FEV6/FVC ratio: 99 %
Pre FEV1/FVC ratio: 70 %
Pre FEV6/FVC Ratio: 99 %
RV % pred: 113 %
RV: 3.24 L
TLC % pred: 92 %
TLC: 6.72 L

## 2017-08-13 MED ORDER — ALBUTEROL SULFATE (2.5 MG/3ML) 0.083% IN NEBU
2.5000 mg | INHALATION_SOLUTION | Freq: Once | RESPIRATORY_TRACT | Status: AC
  Administered 2017-08-13: 2.5 mg via RESPIRATORY_TRACT

## 2017-08-16 ENCOUNTER — Encounter: Payer: Self-pay | Admitting: Pulmonary Disease

## 2017-08-16 ENCOUNTER — Ambulatory Visit (INDEPENDENT_AMBULATORY_CARE_PROVIDER_SITE_OTHER): Payer: Medicare Other | Admitting: Pulmonary Disease

## 2017-08-16 ENCOUNTER — Other Ambulatory Visit (INDEPENDENT_AMBULATORY_CARE_PROVIDER_SITE_OTHER): Payer: Medicare Other

## 2017-08-16 VITALS — BP 110/60 | HR 78 | Ht 71.0 in | Wt 200.4 lb

## 2017-08-16 DIAGNOSIS — R911 Solitary pulmonary nodule: Secondary | ICD-10-CM

## 2017-08-16 DIAGNOSIS — J411 Mucopurulent chronic bronchitis: Secondary | ICD-10-CM | POA: Diagnosis not present

## 2017-08-16 DIAGNOSIS — K219 Gastro-esophageal reflux disease without esophagitis: Secondary | ICD-10-CM

## 2017-08-16 DIAGNOSIS — J301 Allergic rhinitis due to pollen: Secondary | ICD-10-CM | POA: Diagnosis not present

## 2017-08-16 LAB — CBC WITH DIFFERENTIAL/PLATELET
Basophils Absolute: 0 10*3/uL (ref 0.0–0.1)
Basophils Relative: 0.2 % (ref 0.0–3.0)
Eosinophils Absolute: 0.3 10*3/uL (ref 0.0–0.7)
Eosinophils Relative: 2.1 % (ref 0.0–5.0)
HCT: 39.7 % (ref 39.0–52.0)
Hemoglobin: 13 g/dL (ref 13.0–17.0)
Lymphocytes Relative: 18.8 % (ref 12.0–46.0)
Lymphs Abs: 2.6 10*3/uL (ref 0.7–4.0)
MCHC: 32.7 g/dL (ref 30.0–36.0)
MCV: 89.8 fl (ref 78.0–100.0)
Monocytes Absolute: 1.3 10*3/uL — ABNORMAL HIGH (ref 0.1–1.0)
Monocytes Relative: 9.4 % (ref 3.0–12.0)
Neutro Abs: 9.6 10*3/uL — ABNORMAL HIGH (ref 1.4–7.7)
Neutrophils Relative %: 69.5 % (ref 43.0–77.0)
Platelets: 285 10*3/uL (ref 150.0–400.0)
RBC: 4.42 Mil/uL (ref 4.22–5.81)
RDW: 14.3 % (ref 11.5–15.5)
WBC: 13.8 10*3/uL — ABNORMAL HIGH (ref 4.0–10.5)

## 2017-08-16 MED ORDER — FLUTICASONE FUROATE-VILANTEROL 100-25 MCG/INH IN AEPB: 1.0000 | INHALATION_SPRAY | Freq: Every day | RESPIRATORY_TRACT | 0 refills | Status: DC

## 2017-08-16 NOTE — Patient Instructions (Signed)
Allergic rhinitis: Use Neil Med rinses with distilled water at least twice per day using the instructions on the package. 1/2 hour after using the [facility] Med rinse, use Nasacort two puffs in each nostril once per day.  Remember that the Nasacort can take 1-2 weeks to work after regular use. Use generic zyrtec (cetirizine) every day.  If this doesn't help, then stop taking it and use chlorpheniramine-phenylephrine combination tablets.  COPD: Continue taking Breo 1 puff daily We will check a complete blood count with differential today and a serum IgE to look for evidence of allergy Practice good hand hygiene this time a year  Gastroesophageal reflux disease: Continue taking antacid therapy as you are doing  Pulmonary nodules: We will arrange for a repeat CT chest in 3 months to follow these to make sure they are not growing as, at this time they appear benign  Coronary artery disease: From my standpoint you are not at excessive risk for a pulmonary complication from thoracic surgery.  Because you have COPD you are at slight risk for having a respiratory problem but I do not think that this is prohibitive.  I recommend that you consider coronary artery bypass graft surgery with Dr. Servando Snare.  Follow-up with me in 3 months

## 2017-08-16 NOTE — Progress Notes (Signed)
Subjective:    Patient ID: Tim [name], male    DOB: Jun 06, 1929, 83 y.o.   MRN: [id number]  Synopsis: Former patient of Tim [name] with COPD and a paralyzed R Hemidiaphragm as seen on fluoroscopy in 2007. Lung function testing in 2005 showed clear airflow obstruction, FEV1 of 1.53 L (43% predicted), total lung capacity 62% predicted, DLCO 73% predicted. He smoked 2-3 packs per day for 40 years, quit around 1980.  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    HPI Chief Complaint  Patient presents with  . Follow-up    cardiologist has suggested heart surgery   He feels better on Breo.  He says that some days (today included) he has more chest congestion in the mornings.  He says that rarely he will cough up green phlegm.  He wheezes some days, not most.  He will cough up green mucus every other day or so.  He says that the Memory Dance is definitely helping him compared to before.  He says that he has less congestion.  He went to cardiology had a heart cath and then was referred to cardiothoracic surgery for coronary artery bypass grafting.  He is making his decision about this now.     Past Medical History:  Diagnosis Date  . BPH (benign prostatic hypertrophy)   . CAD (coronary artery disease)    9 STEMI 2001, PCI, RCA, residual 100% circumflex  /   PCI for in-stent restenosis June, 2001  /  nuclear February, 2010, no ischemia  . Carotid artery disease ([place])    Doppler, June, 2011, 0-39% bilateral  . Cervical disc disease    Status post neurosurgery  . Colon polyps   . Ejection fraction    EF 55-65%, echo, 2009  . Elevated CPK    Chronic mild CPK elevation  . Emphysema    Mild  . Fatigue    Morning fatigue, August, 2011  . GERD (gastroesophageal reflux disease)   . Hyperlipidemia   . Hypertension   . Low back pain    February, 2013  . Paralyzed hemidiaphragm    Right  . Paralyzed hemidiaphragm    Chronic  . Statin intolerance    Elevated CPK in  the past      Review of Systems  Constitutional: Negative for fatigue and fever.  HENT: Negative for postnasal drip and rhinorrhea.   Respiratory: Positive for cough. Negative for shortness of breath and wheezing.   Cardiovascular: Negative for chest pain and leg swelling.       Objective:   Physical Exam Vitals:   08/16/17 0942  BP: 110/60  Pulse: 78  SpO2: 90%  Weight: 200 lb 6 oz (90.9 kg)  Height: 5\' 11"  (1.803 m)   RA  Gen: well appearing HENT: OP clear, TM's clear, neck supple PULM: CTA B, normal percussion CV: RRR, no mgr, trace edema GI: BS+, soft, nontender Derm: no cyanosis or rash Psyche: normal mood and affect   Chest imaging 01/2016 CXR > granuloma left upper lobe November 2018 CT chest: Less than 4 mm solid pleural-based right upper lobe nodule noted, groundglass nodule 11.9 mm right upper lobe, centrilobular emphysema noted, nonspecific scarring in the bases.  Images independently reviewed by me  Records reviewed from his visit with cardiothoracic surgery where they are considering coronary artery bypass grafting.     Assessment & Plan:   Mucopurulent chronic bronchitis ([place]) - Plan: CBC w/Diff, IgE  Solitary pulmonary  nodule - Plan: CT Chest Wo Contrast  Gastroesophageal reflux disease, esophagitis presence not specified  Allergic rhinitis due to pollen, unspecified seasonality  Pulmonary nodule  Discussion: Tim [name] has not had an exacerbation of COPD since we started him on Breo.  He feels significantly better with this.  Because of the repeated exacerbations he had earlier this year we need to get a CBC with differential to evaluate his eosinophil count and serum IgE to see if there is evidence of an underlying allergy contributing to his symptoms.  However, given the significant improvement with this new inhaler I think things have stabilized.  He has new pulmonary nodule seen on CT scanning of the chest from last week.  We will need to follow  these with serial imaging.  From my standpoint his lung disease is not prohibitive for coronary artery bypass grafting.  I advised him of this today.  Plan: Allergic rhinitis: Use Neil Med rinses with distilled water at least twice per day using the instructions on the package. 1/2 hour after using the [facility] Med rinse, use Nasacort two puffs in each nostril once per day.  Remember that the Nasacort can take 1-2 weeks to work after regular use. Use generic zyrtec (cetirizine) every day.  If this doesn't help, then stop taking it and use chlorpheniramine-phenylephrine combination tablets.  COPD: Continue taking Breo 1 puff daily We will check a complete blood count with differential today and a serum IgE to look for evidence of allergy Practice good hand hygiene this time a year  Gastroesophageal reflux disease: Continue taking antacid therapy as you are doing  Pulmonary nodules: We will arrange for a repeat CT chest in 3 months to follow these to make sure they are not growing as, at this time they appear benign  Coronary artery disease: From my standpoint you are not at excessive risk for a pulmonary complication from thoracic surgery.  Because you have COPD you are at slight risk for having a respiratory problem but I do not think that this is prohibitive.  I recommend that you consider coronary artery bypass graft surgery with Dr. Servando [name].  Follow-up with me in 3 months    Current Outpatient Medications:  .  Acetaminophen 500 MG coapsule, Take 500 mg every 8 (eight) hours as needed by mouth (pain). , Disp: , Rfl:  .  albuterol (PROVENTIL HFA;VENTOLIN HFA) 108 (90 BASE) MCG/ACT inhaler, Inhale 2 puffs into the lungs every 6 (six) hours as needed for wheezing or shortness of breath., Disp: 1 Inhaler, Rfl: 6 .  albuterol (PROVENTIL) (2.5 MG/3ML) 0.083% nebulizer solution, Take 3 mLs (2.5 mg total) by nebulization every 6 (six) hours as needed for wheezing or shortness of breath., Disp:  75 mL, Rfl: 12 .  aspirin 81 MG tablet, Take 1 tablet (81 mg total) by mouth daily., Disp: 30 tablet, Rfl: 6 .  atenolol (TENORMIN) 50 MG tablet, Take 1 tablet (50 mg total) by mouth daily., Disp: 90 tablet, Rfl: 2 .  azelastine (ASTELIN) 0.1 % nasal spray, Place 2 sprays into both nostrils 2 (two) times daily. Use in each nostril as directed, Disp: , Rfl:  .  doxycycline (PERIOSTAT) 20 MG tablet, Take 20 mg by mouth daily., Disp: , Rfl:  .  finasteride (PROSCAR) 5 MG tablet, Take 1 tablet by mouth daily., Disp: , Rfl:  .  fluticasone furoate-vilanterol (BREO ELLIPTA) 100-25 MCG/INH AEPB, Inhale 1 puff daily into the lungs., Disp: 1 each, Rfl: 0 .  furosemide (LASIX) 40  MG tablet, Take 40 mg daily as needed by mouth (chest pressure)., Disp: , Rfl:  .  isosorbide mononitrate (IMDUR) 30 MG 24 hr tablet, Take 1 tablet (30 mg total) daily by mouth., Disp: 30 tablet, Rfl: 6 .  omeprazole (PRILOSEC) 40 MG capsule, Take 1 capsule (40 mg total) by mouth daily., Disp: 90 capsule, Rfl: 3 .  predniSONE (DELTASONE) 20 MG tablet, Take 1 tablet (20 mg total) daily with breakfast by mouth., Disp: 5 tablet, Rfl: 0 .  tamsulosin (FLOMAX) 0.4 MG CAPS capsule, Take 0.4 mg by mouth as needed. For urinary symptoms, Disp: , Rfl:

## 2017-08-17 LAB — IGE: IgE (Immunoglobulin E), Serum: 18 kU/L (ref <=114)

## 2017-08-19 ENCOUNTER — Other Ambulatory Visit: Payer: Self-pay

## 2017-08-19 ENCOUNTER — Ambulatory Visit (INDEPENDENT_AMBULATORY_CARE_PROVIDER_SITE_OTHER): Payer: Medicare Other | Admitting: Cardiothoracic Surgery

## 2017-08-19 ENCOUNTER — Encounter: Payer: Self-pay | Admitting: Cardiothoracic Surgery

## 2017-08-19 VITALS — BP 152/78 | HR 75 | Ht 71.0 in | Wt 200.0 lb

## 2017-08-19 DIAGNOSIS — I25118 Atherosclerotic heart disease of native coronary artery with other forms of angina pectoris: Secondary | ICD-10-CM | POA: Diagnosis not present

## 2017-08-19 DIAGNOSIS — I2510 Atherosclerotic heart disease of native coronary artery without angina pectoris: Secondary | ICD-10-CM

## 2017-08-19 NOTE — Patient Instructions (Signed)

## 2017-08-19 NOTE — Progress Notes (Signed)
[address]       [place] [address]             [id number]                    Tim [name] [place] Medical Record #[id number] Date of Birth: October 07, 1928  Referring: Nelva Bush, MD Primary Care: Raelene Bott, MD  Chief Complaint:    Chief Complaint  Patient presents with  . Follow-up    History of Present Illness:    Tim [name] 83 y.o. male is seen in the office  today to continue evaluation for possible coronary artery bypass graft .  Since seen last week CT of the chest and pulmonary function studies have been performed .  The patient has discussed with his family the recommendations made last week .  He now is willing to consider proceeding with  consideration of coronary artery bypass grafting.    Patient has known coronary artery disease having had stents placed in the past.  3-4 weeks ago he was admitted to [facility] with acute exacerbation of shortness of breath during the night.  He was treated and released and made arrangements to see Dr. Marlou Porch soon afterwards.  Cardiac catheterization was recommended and performed November 15. The patient notes episodic shortness of breath with chest discomfort sometimes radiating into the left arm.  This is occurred at night and also with exertion.  Patient continues to work in his home shop on a daily basis, but does limit his activities notes that he can no longer climb under the house. Patient has history of multiple visits for bladder outlet obstruction prostatitis and chronic obstructive pulmonary disease with acute exacerbations. Former patient of Dr. Gwenette Greet with COPD and a paralyzed R Hemidiaphragm as seen on fluoroscopy in 2007. Lung function testing in 2005 showed clear airflow obstruction, FEV1 of 1.53 L (43% predicted), total lung capacity 62% predicted, DLCO 73% predicted. He smoked 2-3 packs per day for 40 years, quit around 1980.  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   Small peripheral pulmonary nodule, thought to be a granuloma was noted in November 2015 on chest x-ray, not noted on film 02/05/2017.  Current Activity/ Functional Status:  Patient is independent with mobility/ambulation, transfers, ADL's, IADL's.   Zubrod Score: At the time of surgery this patient's most appropriate activity status/level should be described as: []     0    Normal activity, no symptoms []     1    Restricted in physical strenuous activity but ambulatory, able to do out light work [x]     2    Ambulatory and capable of self care, unable to do work activities, up and about               >50 % of waking hours                              []     3    Only limited self care, in bed greater than 50% of waking hours []     4    Completely disabled, no self care, confined to bed or chair []     5    Moribund   Past Medical History:  Diagnosis Date  . BPH (benign prostatic hypertrophy)   . CAD (coronary artery disease)    9 STEMI  2001, PCI, RCA, residual 100% circumflex  /   PCI for in-stent restenosis June, 2001  /  nuclear February, 2010, no ischemia  . Carotid artery disease ([place])    Doppler, June, 2011, 0-39% bilateral  . Cervical disc disease    Status post neurosurgery  . Colon polyps   . Ejection fraction    EF 55-65%, echo, 2009  . Elevated CPK    Chronic mild CPK elevation  . Emphysema    Mild  . Fatigue    Morning fatigue, August, 2011  . GERD (gastroesophageal reflux disease)   . Hyperlipidemia   . Hypertension   . Low back pain    February, 2013  . Paralyzed hemidiaphragm    Right  . Paralyzed hemidiaphragm    Chronic  . Statin intolerance    Elevated CPK in the past    Past Surgical History:  Procedure Laterality Date  . BREAST SURGERY     right  . CERVICAL DISC SURGERY    . LEFT HEART CATH AND CORONARY ANGIOGRAPHY N/A 07/29/2017   Procedure: LEFT HEART CATH AND CORONARY ANGIOGRAPHY;  Surgeon: Nelva Bush, MD;   Location: [facility];  Service: Cardiovascular;  Laterality: N/A;  . SKIN CANCER EXCISION      Family History  Problem Relation Age of Onset  . Diabetes Mother   . Other Father   . Diabetes Sister   . Kidney disease Brother   . Cancer Brother   . Other Sister     Social History   Socioeconomic History  . Marital status: Married    Spouse name: Not on file  . Number of children: Not on file  . Years of education: Not on file  . Highest education level: Not on file  Social Needs  . Financial resource strain: Not on file  . Food insecurity - worry: Not on file  . Food insecurity - inability: Not on file  . Transportation needs - medical: Not on file  . Transportation needs - non-medical: Not on file  Occupational History  . Occupation: retired Games developer  Tobacco Use  . Smoking status: Former Smoker    Packs/day: 3.00    Years: 40.00    Pack years: 120.00    Types: Cigarettes    Last attempt to quit: 09/15/1975    Years since quitting: 41.9  . Smokeless tobacco: Never Used  Substance and Sexual Activity  . Alcohol use: Not on file  . Drug use: Not on file  . Sexual activity: Not on file  Other Topics Concern  . Not on file  Social History Narrative  . Not on file    Social History   Tobacco Use  Smoking Status Former Smoker  . Packs/day: 3.00  . Years: 40.00  . Pack years: 120.00  . Types: Cigarettes  . Last attempt to quit: 09/15/1975  . Years since quitting: 41.9  Smokeless Tobacco Never Used    Social History   Substance and Sexual Activity  Alcohol Use Not on file     Allergies  Allergen Reactions  . Statins Other (See Comments)    Severe leg cramps with multiple statins    Current Outpatient Medications  Medication Sig Dispense Refill  . Acetaminophen 500 MG coapsule Take 500 mg every 8 (eight) hours as needed by mouth (pain).     [name] albuterol (PROVENTIL HFA;VENTOLIN HFA) 108 (90 BASE) MCG/ACT inhaler Inhale 2 puffs into the lungs every  6 (six) hours as needed for  wheezing or shortness of breath. 1 Inhaler 6  . albuterol (PROVENTIL) (2.5 MG/3ML) 0.083% nebulizer solution Take 3 mLs (2.5 mg total) by nebulization every 6 (six) hours as needed for wheezing or shortness of breath. 75 mL 12  . aspirin 81 MG tablet Take 1 tablet (81 mg total) by mouth daily. 30 tablet 6  . atenolol (TENORMIN) 50 MG tablet Take 1 tablet (50 mg total) by mouth daily. 90 tablet 2  . azelastine (ASTELIN) 0.1 % nasal spray Place 2 sprays into both nostrils 2 (two) times daily. Use in each nostril as directed    . doxycycline (PERIOSTAT) 20 MG tablet Take 20 mg by mouth daily.    . finasteride (PROSCAR) 5 MG tablet Take 1 tablet by mouth 2 (two) times daily.     . fluticasone furoate-vilanterol (BREO ELLIPTA) 100-25 MCG/INH AEPB Inhale 1 puff into the lungs daily. 1 each 0  . furosemide (LASIX) 40 MG tablet Take 40 mg daily as needed by mouth (chest pressure).    . isosorbide mononitrate (IMDUR) 30 MG 24 hr tablet Take 1 tablet (30 mg total) daily by mouth. 30 tablet 6  . omeprazole (PRILOSEC) 40 MG capsule Take 1 capsule (40 mg total) by mouth daily. 90 capsule 3  . tamsulosin (FLOMAX) 0.4 MG CAPS capsule Take 0.4 mg by mouth 2 (two) times daily. For urinary symptoms      No current facility-administered medications for this visit.     Pertinent items are noted in HPI.   Review of Systems:     Cardiac Review of Systems: Y or N  Chest Pain [  y  ]  Resting SOB [n   ] Exertional SOB  [ y ]  Vertell Limber Florencio.Farrier  ]   Pedal Edema [ n  ]    Palpitations [ n ] Syncope  [ n ]   Presyncope [ n  ]  General Review of Systems: [Y] = yes [  ]=no Constitional: recent weight change [n ];  Wt loss over the last 3 months [   ] anorexia [  ]; fatigue Blue.Reese  ]; nausea [ n ]; night sweats [n  ]; fever [ n ]; or chills [ n ];          Dental: poor dentition[  ]; Last Dentist visit:   Eye : blurred vision [  ]; diplopia [   ]; vision changes [  ];  Amaurosis fugax[  ]; Resp: cough  Blue.Reese  ];  wheezing[y  ];  hemoptysis[ n ]; shortness of breath[y  ]; paroxysmal nocturnal dyspnea[ y ]; dyspnea on exertion[ y ]; or orthopnea[y  ];  GI:  gallstones[  ], vomiting[  ];  dysphagia[  ]; melena[  ];  hematochezia [  ]; heartburn[  ];   Hx of  Colonoscopy[  ]; GU: kidney stones [  ]; hematuria[y  ];   dysuria [  ];  nocturia[  ];  history of     obstruction [  ]; urinary frequency [  ]             Skin: rash, swelling[  ];, hair loss[  ];  peripheral edema[  ];  or itching[  ]; Musculosketetal: myalgias[  ];  joint swelling[  ];  joint erythema[  ];  joint pain[  ];  back pain[  ];  Heme/Lymph: bruising[n  ];  bleeding[  ];  anemia[ n ];  Neuro: TIA[  ];  headaches[  ];  stroke[  ];  vertigo[  ];  seizures[n ];   paresthesias[  ];  difficulty walking[ y ];  Psych:depression[  ]; anxiety[  ];  Endocrine: diabetes[  ];  thyroid dysfunction[  ];  Immunizations: Flu up to date [ y ]; Pneumococcal up to date [ y ];  Other:  Physical Exam: BP (!) 152/78   Pulse 75   Ht 5\' 11"  (1.803 m)   Wt 200 lb (90.7 kg)   SpO2 97%   BMI 27.89 kg/m   PHYSICAL EXAMINATION: General appearance: alert, cooperative, appears stated age and no distress Head: Normocephalic, without obvious abnormality, atraumatic Neck: no adenopathy, no carotid bruit, no JVD, supple, symmetrical, trachea midline and thyroid not enlarged, symmetric, no tenderness/mass/nodules Lymph nodes: Cervical, supraclavicular, and axillary nodes normal. Resp: diminished breath sounds bibasilar Back: symmetric, no curvature. ROM normal. No CVA tenderness. Cardio: regular rate and rhythm, S1, S2 normal, no murmur, click, rub or gallop GI: soft, non-tender; bowel sounds normal; no masses,  no organomegaly Extremities: extremities normal, atraumatic, no cyanosis or edema and no ulcers, gangrene or trophic changes Neurologic: Grossly normal    6 Minute Walk Test Results  Patient: Tim [name] Date:  08/19/2017   Supplemental O2 during test? no      Baseline   End  Time            10:20    10:24 Heartrate  71    88 Dyspnea  S    3 Fatigue  S    3 O2 sat   98%    96% Blood pressure 144/83    185/85   Patient ambulated at a NORMAL SLOW pace for a total distance of 588 feet with no stops.  Ambulation was limited primarily due to SOB and hip/knee discomfort  Overall the test was tolerated well  PFT's : FEV1 2.71 85% DOCO 14.6  43%  Diagnostic Studies & Laboratory data:     Recent Radiology Findings:   Ct Chest Wo Contrast  Result Date: 08/13/2017 CLINICAL DATA:  Possible preop for bypass surgery. Lung nodule. Cough. Shortness of breath on exertion. Remote history of smoking. EXAM: CT CHEST WITHOUT CONTRAST TECHNIQUE: Multidetector CT imaging of the chest was performed following the standard protocol without IV contrast. COMPARISON:  07/24/2017.  02/05/2017.  No prior CT. FINDINGS: Cardiovascular: Tortuous thoracic aorta. Aortic and branch vessel atherosclerosis. Normal heart size, without pericardial effusion. Multivessel coronary artery atherosclerosis. Pulmonary artery enlargement, outflow tract 3.3 cm. Mediastinum/Nodes: No mediastinal or definite hilar adenopathy, given limitations of unenhanced CT. Lungs/Pleura: No pleural fluid.  Moderate centrilobular emphysema. Calcified right upper lobe granuloma 5 mm on image 46/series 5. inferior right upper lobe ground-glass nodule measures 12 mm on image 78/series 5 is felt to be similar to on the prior exam (given thinner slice collimation today). Subpleural bibasilar, lingular and inferior right middle lobe ground-glass opacity and septal thickening. The right lower lobe and right middle lobe opacities were present on the prior, favoring atelectasis. There is also patchy peripheral lingular and superior segment left lower lobe ground-glass opacity. Upper Abdomen: Central left hepatic lobe cyst. Normal imaged portions of  the spleen, stomach, adrenal glands, kidneys. Pancreatic parenchymal calcifications. Musculoskeletal: Probable sebaceous cyst about the upper right paraspinous region at 2.1 cm on image 23/series 5. Bilateral glenohumeral joint osteoarthritis. Moderate thoracic spondylosis. IMPRESSION: 1. No dominant or suspicious pulmonary nodule. 2. inferior right upper lobe ground-glass nodule is felt to be similar back to 11/16/2006, most consistent with  a benign etiology. 3. Multifocal areas of ground-glass opacity with scattered foci septal thickening. Some of these were present in 2008 and could represent atelectasis. Others are new and increased. Considerations include mild infection (including atypical etiologies) or pulmonary edema (less likely given lack of pleural fluid). 4. Pulmonary artery enlargement suggests pulmonary arterial hypertension. 5. Coronary artery atherosclerosis. Aortic Atherosclerosis (ICD10-I70.0). 6. Chronic calcific pancreatitis. Electronically Signed   By: Abigail Miyamoto M.D.   On: 08/13/2017 13:48   I have independently reviewed the above radiology studies  and reviewed the findings with the patient.      Recent Lab Findings: Lab Results  Component Value Date   WBC 13.8 (H) 08/16/2017   HGB 13.0 08/16/2017   HCT 39.7 08/16/2017   PLT 285.0 08/16/2017   GLUCOSE 83 07/27/2017   ALT 18 12/27/2013   AST 17 12/27/2013   NA 142 07/27/2017   K 4.4 07/27/2017   CL 105 07/27/2017   CREATININE 0.94 07/27/2017   BUN 22 07/27/2017   CO2 21 07/27/2017   TSH 1.10 10/31/2012   INR 1.0 07/27/2017    Most recent echo cardiogram is dated February 2014.     LEFT HEART CATH AND CORONARY ANGIOGRAPHY  Conclusion   Conclusions: 1. Significant three-vessel coronary artery disease, including sequential 70% and 50% proximal/mid LAD stenoses as well as chronic total occlusion of the proximal/mid LCx and distal RCA. 2. Moderate to severe in-stent restenosis in the mid/distal RCA. 3. Normal  left ventricular contraction and filling pressure.  Recommendations: 1. Cardiac surgery consultation, given significant three vessel coronary artery disease. OM3 and rPDA are supplied by collaterals. 2. If patient is not a surgical candidate, PCI to proximal/mid LAD could be considered, with continued medical management of chronic total occlusions of the LCx and RCA. 3. Aggressive secondary prevention. Consider retrial of statin versus evaluation for PCSK9 inhibitor therapy. 4. Continue atenolol and isosorbide mononitrate, to be uptitrated as tolerated.                Nelva Bush, MD [facility] Pager: [phone]  Coronary Findings   Diagnostic  Dominance: Right  Left Main  Vessel is large.  Mid LM to Ost LAD lesion 30% stenosed  Mid LM to Ost LAD lesion is 30% stenosed.  Left Anterior Descending  Vessel is large.  Prox LAD lesion 70% stenosed  Prox LAD lesion is 70% stenosed. The lesion is eccentric. The lesion is mildly calcified.  Mid LAD lesion 50% stenosed  Mid LAD lesion is 50% stenosed. The lesion is eccentric.  First Diagonal Branch  Vessel is moderate in size.  Second Diagonal Branch  Vessel is small in size.  Ramus Intermedius  Vessel is moderate in size. Vessel is angiographically normal.  Left Circumflex  Vessel is moderate in size.  Prox Cx to Mid Cx lesion 100% stenosed  Prox Cx to Mid Cx lesion is 100% stenosed. The lesion is chronically occluded with left-to-left collateral flow.  First Obtuse Marginal Branch  Vessel is small in size.  Second Obtuse Marginal Branch  Vessel is small in size.  Third Obtuse Marginal Branch  Vessel is moderate in size.  Collaterals  3rd Mrg filled by collaterals from 1st Diag.    Right Coronary Artery  Prox RCA lesion 50% stenosed  Prox RCA lesion is 50% stenosed.  Mid RCA to Dist RCA lesion 50% stenosed  Mid RCA to Dist RCA lesion is 50% stenosed. The lesion was previously treated.  Dist RCA lesion 100%  stenosed  Dist RCA lesion is 100% stenosed. The lesion is chronically occluded with left-to-right collateral flow.  Acute Marginal Branch  Collaterals  Acute Mrg filled by collaterals from Dist LAD.    Acute Mrg lesion 100% stenosed  Acute Mrg lesion is 100% stenosed. The lesion is chronically occluded with left-to-right collateral flow.  Right Posterior Descending Artery  Collaterals  RPDA filled by collaterals from 2nd Sept.   Hemo Data    Most Recent Value  AO Systolic Pressure 329 mmHg  AO Diastolic Pressure 67 mmHg  AO Mean 924 mmHg  LV Systolic Pressure 268 mmHg  LV Diastolic Pressure 7 mmHg  LV EDP 11 mmHg  Arterial Occlusion Pressure Extended Systolic Pressure 341 mmHg  Arterial Occlusion Pressure Extended Diastolic Pressure 63 mmHg  Arterial Occlusion Pressure Extended Mean Pressure 95 mmHg  Left Ventricular Apex Extended Systolic Pressure 962 mmHg  Left Ventricular Apex Extended Diastolic Pressure 7 mmHg  Left Ventricular Apex Extended EDP Pressure 13 mmHg      Assessment / Plan:    Three-vessel coronary artery disease, with recent admission for unstable angina versus COPD exacerbation-patient referred for consideration of coronary artery bypass grafting because of his in-stent stenosis of the right coronary artery and proximal LAD lesion and total occlusion of the circumflex.  The patient has known underlying COPD, question of a paralyzed diaphragm on the right.  In spite of his age of 11 he remains relatively active around his house.  I discussed coronary artery bypass grafting with the patient his daughter and son, although he does carry a somewhat increased risk due to age it is not prohibitive in his functional status is such that he could achieve a reasonable recovery. CT of the chest shows no worrisome pulmonary nodules Pulmonary function studies does not show prohibitive risk  Known underlying COPD-history of smoking and asbestos exposure in the past Known  prostatic hypertrophy with intermittent bladder outlet obstruction   today in the office again reviewed with the patient and his daughter in detail the risks and options of proceeding with coronary artery bypass grafting.  After he is discussed with his family and thought over his options he is willing to proceed with coronary artery bypass grafting.  We have tentatively scheduled this for December 12.    I  spent 30 minutes counseling the patient face to face and 50% or more the  time was spent in counseling and coordination of care. The total time spent in the appointment was 40 minutes.  Grace Isaac MD      [address] [place] [address] Office [phone]   Beeper [phone]  08/19/2017 11:31 AM

## 2017-08-20 ENCOUNTER — Encounter (HOSPITAL_COMMUNITY)
Admission: RE | Admit: 2017-08-20 | Discharge: 2017-08-20 | Disposition: A | Discharge status: Home / Self Care | Payer: Medicare Other | Source: Ambulatory Visit | Attending: Cardiothoracic Surgery | Admitting: Cardiothoracic Surgery

## 2017-08-20 ENCOUNTER — Other Ambulatory Visit: Payer: Self-pay

## 2017-08-20 ENCOUNTER — Ambulatory Visit (HOSPITAL_COMMUNITY)
Admission: RE | Admit: 2017-08-20 | Discharge: 2017-08-20 | Disposition: A | Discharge status: Home / Self Care | Payer: Medicare Other | Source: Ambulatory Visit | Attending: Cardiothoracic Surgery | Admitting: Cardiothoracic Surgery

## 2017-08-20 ENCOUNTER — Encounter (HOSPITAL_COMMUNITY): Payer: Self-pay

## 2017-08-20 ENCOUNTER — Ambulatory Visit (HOSPITAL_BASED_OUTPATIENT_CLINIC_OR_DEPARTMENT_OTHER)
Admission: RE | Admit: 2017-08-20 | Discharge: 2017-08-20 | Disposition: A | Discharge status: Home / Self Care | Payer: Medicare Other | Source: Ambulatory Visit | Attending: Cardiothoracic Surgery | Admitting: Cardiothoracic Surgery

## 2017-08-20 DIAGNOSIS — Z01810 Encounter for preprocedural cardiovascular examination: Secondary | ICD-10-CM | POA: Insufficient documentation

## 2017-08-20 DIAGNOSIS — I2510 Atherosclerotic heart disease of native coronary artery without angina pectoris: Secondary | ICD-10-CM

## 2017-08-20 DIAGNOSIS — Z01812 Encounter for preprocedural laboratory examination: Secondary | ICD-10-CM | POA: Diagnosis present

## 2017-08-20 DIAGNOSIS — Z01818 Encounter for other preprocedural examination: Secondary | ICD-10-CM | POA: Insufficient documentation

## 2017-08-20 HISTORY — DX: Abdominal aortic aneurysm, without rupture, unspecified: I71.40

## 2017-08-20 HISTORY — DX: Unspecified asthma, uncomplicated: J45.909

## 2017-08-20 HISTORY — DX: Abdominal aortic aneurysm, without rupture: I71.4

## 2017-08-20 HISTORY — DX: Headache, unspecified: R51.9

## 2017-08-20 HISTORY — DX: Headache: R51

## 2017-08-20 HISTORY — DX: Acute myocardial infarction, unspecified: I21.9

## 2017-08-20 HISTORY — DX: Malignant (primary) neoplasm, unspecified: C80.1

## 2017-08-20 LAB — APTT: aPTT: 32 seconds (ref 24–36)

## 2017-08-20 LAB — COMPREHENSIVE METABOLIC PANEL
ALT: 18 U/L (ref 17–63)
AST: 13 U/L — ABNORMAL LOW (ref 15–41)
Albumin: 3.2 g/dL — ABNORMAL LOW (ref 3.5–5.0)
Alkaline Phosphatase: 74 U/L (ref 38–126)
Anion gap: 6 (ref 5–15)
BUN: 21 mg/dL — ABNORMAL HIGH (ref 6–20)
CO2: 23 mmol/L (ref 22–32)
Calcium: 8.5 mg/dL — ABNORMAL LOW (ref 8.9–10.3)
Chloride: 108 mmol/L (ref 101–111)
Creatinine, Ser: 1.08 mg/dL (ref 0.61–1.24)
GFR calc Af Amer: >60 mL/min (ref >60)
GFR calc non Af Amer: >60 mL/min (ref >60)
Glucose, Bld: 95 mg/dL (ref 65–99)
Potassium: 4.3 mmol/L (ref 3.5–5.1)
Sodium: 137 mmol/L (ref 135–145)
Total Bilirubin: 0.6 mg/dL (ref 0.3–1.2)
Total Protein: 5.8 g/dL — ABNORMAL LOW (ref 6.5–8.1)

## 2017-08-20 LAB — BLOOD GAS, ARTERIAL
Acid-base deficit: 0.6 mmol/L (ref 0.0–2.0)
Bicarbonate: 23.2 mmol/L (ref 20.0–28.0)
Drawn by: 470591
FIO2: 21
O2 Saturation: 96.9 %
Patient temperature: 98.6
pCO2 arterial: 35.6 mmHg (ref 32.0–48.0)
pH, Arterial: 7.43 (ref 7.350–7.450)
pO2, Arterial: 85.4 mmHg (ref 83.0–108.0)

## 2017-08-20 LAB — URINALYSIS, ROUTINE W REFLEX MICROSCOPIC
Bilirubin Urine: NEGATIVE
Glucose, UA: NEGATIVE mg/dL
Hgb urine dipstick: NEGATIVE
Ketones, ur: NEGATIVE mg/dL
Leukocytes, UA: NEGATIVE
Nitrite: NEGATIVE
Protein, ur: NEGATIVE mg/dL
Specific Gravity, Urine: 1.009 (ref 1.005–1.030)
pH: 7 (ref 5.0–8.0)

## 2017-08-20 LAB — CBC
HCT: 38.4 % — ABNORMAL LOW (ref 39.0–52.0)
Hemoglobin: 12.7 g/dL — ABNORMAL LOW (ref 13.0–17.0)
MCH: 29.4 pg (ref 26.0–34.0)
MCHC: 33.1 g/dL (ref 30.0–36.0)
MCV: 88.9 fL (ref 78.0–100.0)
Platelets: 255 10*3/uL (ref 150–400)
RBC: 4.32 MIL/uL (ref 4.22–5.81)
RDW: 14 % (ref 11.5–15.5)
WBC: 9.3 10*3/uL (ref 4.0–10.5)

## 2017-08-20 LAB — TYPE AND SCREEN
ABO/RH(D): A POS
Antibody Screen: NEGATIVE

## 2017-08-20 LAB — HEMOGLOBIN A1C
Hgb A1c MFr Bld: 6.4 % — ABNORMAL HIGH (ref 4.8–5.6)
Mean Plasma Glucose: 136.98 mg/dL

## 2017-08-20 LAB — ABO/RH: ABO/RH(D): A POS

## 2017-08-20 LAB — PROTIME-INR
INR: 1.01
Prothrombin Time: 13.3 seconds (ref 11.4–15.2)

## 2017-08-20 LAB — SURGICAL PCR SCREEN
MRSA, PCR: NEGATIVE
Staphylococcus aureus: NEGATIVE

## 2017-08-20 NOTE — Progress Notes (Addendum)
PCP - Wilmon Pali [facility] Cardiologist - Dr. Clearnce Hasten- Dr. Lake Bells  Chest x-ray - 08/20/2017  EKG - 07/29/17 Stress Test - 08/02/13 ECHO - 08/20/17 Cardiac Cath - 07/29/17  Aspirin Instructions: Patient was not given instructions on Aspirin. Message left at Dr. Everrett Coombe office. Patient and son notified to call office to receive these instructions. Patient verbalized understanding.   Patient denies shortness of breath, fever, cough and chest pain at PAT appointment   Patient verbalized understanding of instructions that were given to them at the PAT appointment. Patient was also instructed that they will need to review over the PAT instructions again at home before surgery.

## 2017-08-20 NOTE — Pre-Procedure Instructions (Signed)
Tim [name]  08/20/2017      [id number] [place] [address] Phone: [phone] Fax: [phone]  [facility] [place], [place] [address] [place] [address] Phone: [phone] Fax: [phone]    Your procedure is scheduled on Tuesday December 11.  Report to [facility] Admitting at 5:30 A.M.  Call this number if you have problems the morning of surgery:  [phone]   Remember:  Do not eat food or drink liquids after midnight.  Take these medicines the morning of surgery with A SIP OF WATER:   Atenolol (tenormin) Finasteride (proscar) Isosorbide (imdur) Omeprazole (prilosec) tamsulosin (flomax) Breo-Ellipta Acetaminophen (tylenol) if needed Albuterol if needed  7 days prior to surgery STOP taking any Aleve, Naproxen, Ibuprofen, Motrin, Advil, Goody's, BC's, all herbal medications, fish oil, and all vitamins  FOLLOW Surgeon's instructions on stopping Aspirin. If no instructions were given, please call surgeon's office.      Do not wear jewelry, make-up or nail polish.  Do not wear lotions, powders, or perfumes, or deoderant.  Do not shave 48 hours prior to surgery.  Men may shave face and neck.  Do not bring valuables to the hospital.  [facility] is not responsible for any belongings or valuables.  Contacts, dentures or bridgework may not be worn into surgery.  Leave your suitcase in the car.  After surgery it may be brought to your room.  For patients admitted to the hospital, discharge time will be determined by your treatment team.  Patients discharged the day of surgery will not be allowed to drive home.   Special instructions:    - Preparing For Surgery  Before surgery, you can play an important role. Because skin is not sterile, your skin needs to be as free of germs as possible. You can reduce the  number of germs on your skin by washing with CHG (chlorahexidine gluconate) Soap before surgery.  CHG is an antiseptic cleaner which kills germs and bonds with the skin to continue killing germs even after washing.  Please do not use if you have an allergy to CHG or antibacterial soaps. If your skin becomes reddened/irritated stop using the CHG.  Do not shave (including legs and underarms) for at least 48 hours prior to first CHG shower. It is OK to shave your face.  Please follow these instructions carefully.   1. Shower the NIGHT BEFORE SURGERY and the MORNING OF SURGERY with CHG.   2. If you chose to wash your hair, wash your hair first as usual with your normal shampoo.  3. After you shampoo, rinse your hair and body thoroughly to remove the shampoo.  4. Use CHG as you would any other liquid soap. You can apply CHG directly to the skin and wash gently with a scrungie or a clean washcloth.   5. Apply the CHG Soap to your body ONLY FROM THE NECK DOWN.  Do not use on open wounds or open sores. Avoid contact with your eyes, ears, mouth and genitals (private parts). Wash Face and genitals (private parts)  with your normal soap.  6. Wash thoroughly, paying special attention to the area where your surgery will be performed.  7. Thoroughly rinse your body with warm water from the neck down.  8. DO NOT shower/wash with your normal soap after using and rinsing  off the CHG Soap.  9. Pat yourself dry with a CLEAN TOWEL.  10. Wear CLEAN PAJAMAS to bed the night before surgery, wear comfortable clothes the morning of surgery  11. Place CLEAN SHEETS on your bed the night of your first shower and DO NOT SLEEP WITH PETS.    Day of Surgery: Do not apply any deodorants/lotions. Please wear clean clothes to the hospital/surgery center.      Please read over the following fact sheets that you were given. Coughing and Deep Breathing, MRSA Information and Surgical Site Infection  Prevention

## 2017-08-20 NOTE — Progress Notes (Signed)
Pre-op Cardiac Surgery  Carotid Findings:  Rt- 1-39% ICA stenosis, Le 40-59% mid ICA stenosis. Bilateral vertebral arteries appear patent and antegrade.  Upper Extremity Right Left  Brachial Pressures 119, Tri 152, Tri  Radial Waveforms Tri Tri  Ulnar Waveforms Tri Tri  Palmar Arch (Allen's Test) Waveform obliterate with radial compression and remains unchanged with ulnar compression. Waveform obliterates with radial compression and decreases less than 50% with ulnar compression.    Lower  Extremity Right Left  Dorsalis Pedis 146, Bi 115, Mono  Posterior Tibial 140, Bi 127, Bi  Ankle/Brachial Indices 0.92 0.84   Right ABI: Resting right ankle-brachial index is within normal range. No evidence of significant right lower extremity arterial disease.  Left ABI: Resting left ankle-brachial index indicates mild right lower extremity arterial disease.   Lita Mains- RDMS, RVT 11:51 AM  08/20/2017

## 2017-08-20 NOTE — Progress Notes (Signed)
  Echocardiogram 2D Echocardiogram has been performed.  Tim [name] 08/20/2017, 8:52 AM

## 2017-08-21 LAB — VAS US DOPPLER PRE CABG
LEFT ECA DIAS: -22 cm/s
LEFT VERTEBRAL DIAS: -13 cm/s
Left CCA dist dias: -17 cm/s
Left CCA dist sys: -99 cm/s
Left CCA prox dias: 20 cm/s
Left CCA prox sys: 136 cm/s
Left ICA dist dias: -21 cm/s
Left ICA dist sys: -74 cm/s
Left ICA prox dias: 33 cm/s
Left ICA prox sys: 134 cm/s
RIGHT ECA DIAS: -18 cm/s
RIGHT VERTEBRAL DIAS: -11 cm/s
Right CCA prox dias: 12 cm/s
Right CCA prox sys: 91 cm/s
Right cca dist sys: -100 cm/s

## 2017-08-23 ENCOUNTER — Encounter (HOSPITAL_COMMUNITY): Payer: Self-pay

## 2017-08-23 MED ORDER — DEXMEDETOMIDINE HCL IN NACL 400 MCG/100ML IV SOLN
0.1000 ug/kg/h | INTRAVENOUS | Status: AC
  Administered 2017-08-24: .2 ug/kg/h via INTRAVENOUS
  Filled 2017-08-23: qty 100

## 2017-08-23 MED ORDER — TRANEXAMIC ACID (OHS) BOLUS VIA INFUSION
15.0000 mg/kg | INTRAVENOUS | Status: AC
  Administered 2017-08-24: 1351.5 mg via INTRAVENOUS
  Filled 2017-08-23: qty 1352

## 2017-08-23 MED ORDER — METOPROLOL TARTRATE 12.5 MG HALF TABLET: 12.5000 mg | ORAL_TABLET | Freq: Once | ORAL | Status: DC

## 2017-08-23 MED ORDER — DEXTROSE 5 % IV SOLN
1.5000 g | INTRAVENOUS | Status: AC
  Administered 2017-08-24: 1.5 g via INTRAVENOUS
  Administered 2017-08-24: .75 g via INTRAVENOUS
  Filled 2017-08-23: qty 1.5

## 2017-08-23 MED ORDER — DEXTROSE 5 % IV SOLN
750.0000 mg | INTRAVENOUS | Status: DC
  Filled 2017-08-23: qty 750

## 2017-08-23 MED ORDER — TRANEXAMIC ACID (OHS) PUMP PRIME SOLUTION
2.0000 mg/kg | INTRAVENOUS | Status: DC
  Filled 2017-08-23: qty 1.8

## 2017-08-23 MED ORDER — NITROGLYCERIN IN D5W 200-5 MCG/ML-% IV SOLN
2.0000 ug/min | INTRAVENOUS | Status: AC
  Administered 2017-08-24: 16 ug/min via INTRAVENOUS
  Filled 2017-08-23: qty 250

## 2017-08-23 MED ORDER — MILRINONE LACTATE IN DEXTROSE 20-5 MG/100ML-% IV SOLN
0.1250 ug/kg/min | INTRAVENOUS | Status: DC
  Filled 2017-08-23: qty 100

## 2017-08-23 MED ORDER — MAGNESIUM SULFATE 50 % IJ SOLN
40.0000 meq | INTRAMUSCULAR | Status: DC
  Filled 2017-08-23: qty 9.85

## 2017-08-23 MED ORDER — SODIUM CHLORIDE 0.9 % IV SOLN
INTRAVENOUS | Status: AC
  Administered 2017-08-24: 1 [IU]/h via INTRAVENOUS
  Filled 2017-08-23: qty 1

## 2017-08-23 MED ORDER — SODIUM CHLORIDE 0.9 % IV SOLN
30.0000 ug/min | INTRAVENOUS | Status: AC
  Administered 2017-08-24: 15 ug/min via INTRAVENOUS
  Filled 2017-08-23: qty 2

## 2017-08-23 MED ORDER — POTASSIUM CHLORIDE 2 MEQ/ML IV SOLN
80.0000 meq | INTRAVENOUS | Status: DC
  Filled 2017-08-23: qty 40

## 2017-08-23 MED ORDER — PLASMA-LYTE 148 IV SOLN
INTRAVENOUS | Status: AC
  Administered 2017-08-24: 500 mL
  Filled 2017-08-23: qty 2.5

## 2017-08-23 MED ORDER — CHLORHEXIDINE GLUCONATE 0.12 % MT SOLN
15.0000 mL | Freq: Once | OROMUCOSAL | Status: AC
  Administered 2017-08-24: 15 mL via OROMUCOSAL
  Filled 2017-08-23: qty 15

## 2017-08-23 MED ORDER — DOPAMINE-DEXTROSE 3.2-5 MG/ML-% IV SOLN
0.0000 ug/kg/min | INTRAVENOUS | Status: DC
  Filled 2017-08-23: qty 250

## 2017-08-23 MED ORDER — EPINEPHRINE PF 1 MG/ML IJ SOLN
0.0000 ug/min | INTRAVENOUS | Status: DC
  Filled 2017-08-23: qty 4

## 2017-08-23 MED ORDER — TRANEXAMIC ACID 1000 MG/10ML IV SOLN
1.5000 mg/kg/h | INTRAVENOUS | Status: AC
  Administered 2017-08-24: 1.5 mg/kg/h via INTRAVENOUS
  Filled 2017-08-23: qty 25

## 2017-08-23 MED ORDER — VANCOMYCIN HCL 10 G IV SOLR
1500.0000 mg | INTRAVENOUS | Status: AC
  Administered 2017-08-24: 1500 mg via INTRAVENOUS
  Filled 2017-08-23: qty 1500

## 2017-08-23 MED ORDER — SODIUM CHLORIDE 0.9 % IV SOLN
INTRAVENOUS | Status: DC
  Filled 2017-08-23: qty 30

## 2017-08-23 NOTE — Progress Notes (Signed)
Anesthesia Chart Review: Patient is a 83 year old male scheduled for CABG on 08/24/17 by Dr. Lanelle Bal.   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 07/2017 chest CT showed pulmonary artery enlargement and chronic calcific pancreatitis.   PCP is Dr. Raelene Bott. Cardiologist is Dr. Candee Furbish. Pulmonologist is Dr. Simonne Maffucci. He did not think patient's lung disease was prohibitive for CABG. He recommended serial CT scanning for new pulmonary nodule.   Medications include albuterol, aspirin 81 mg, atenolol, Astelin nasal spray, besifloxacin HCl suspension, doxycycline daily, finasteride, Breo Ellipta, Lasix, Imdur, Prilosec, Flomax.  BP (!) 150/65   Pulse 76   Temp 36.7 C   Resp 20   Ht 5\' 11"  (1.803 m)   Wt 198 lb 11.2 oz (90.1 kg)   SpO2 95%   BMI 27.71 kg/m   EKG 07/29/17: NSR.  Cardiac cath 07/29/17: Conclusions: 1. Significant three-vessel coronary artery disease, including sequential 70% and 50% proximal/mid LAD stenoses as well as chronic total occlusion of the proximal/mid LCx and distal RCA. 2. Moderate to severe in-stent restenosis in the mid/distal RCA. 3. Normal left ventricular contraction and filling pressure. Recommendations: 1. Cardiac surgery consultation, given significant three vessel coronary artery disease. OM3 and rPDA are supplied by collaterals. 2. If patient is not a surgical candidate, PCI to proximal/mid LAD could be considered, with continued medical management of chronic total occlusions of the LCx and RCA. 3. Aggressive secondary prevention. Consider retrial of statin versus evaluation  for PCSK9 inhibitor therapy. 4. Continue atenolol and isosorbide mononitrate, to be uptitrated as tolerated.  Echo 08/20/17: Study Conclusions - Left ventricle: The cavity size was normal. Systolic function was   normal. The estimated ejection fraction was in the range of 55%   to 60%. Probable hypokinesis of the basal-midinferolateral and   inferior myocardium; consistent with ischemia in the distribution   of the right coronary or left circumflex coronary artery. Doppler   parameters are consistent with abnormal left ventricular   relaxation (grade 1 diastolic dysfunction). - Left atrium: The atrium was mildly dilated.  Carotid U/S 08/20/17: Final Interpretation: Right Carotid: There is evidence in the right ICA of a 1-39% stenosis. Left Carotid: There is evidence in the left ICA of a 40-59% stenosis. Vertebrals: Both vertebral arteries were patent with antegrade flow.  Abdominal aortic U/S 07/24/16 ([place]): Aortic atherosclerosis with distal abdominal aortic aneurysm measuring 3 x 3.2 cm. Recommend followup by ultrasound in 2-3 years. This recommendation follows ACR consensus guidelines: White Paper of the ACR Incidental Findings Committee II on Vascular Findings. Joellyn Rued Radiol 2013; 63:875-643  CXR 08/20/17: IMPRESSION: No active cardiopulmonary disease.  CT chest 08/13/17: IMPRESSION: 1. No dominant or suspicious pulmonary nodule. 2. inferior right upper lobe ground-glass nodule is felt to be similar back to 11/16/2006, most consistent with a benign etiology. 3. Multifocal areas of ground-glass opacity with scattered foci septal thickening. Some of these were present in 2008 and could represent atelectasis. Others are new and increased. Considerations include mild infection (including atypical etiologies) or pulmonary edema (less likely given lack of pleural fluid). 4. Pulmonary artery enlargement suggests pulmonary arterial hypertension. 5. Coronary  artery atherosclerosis. Aortic Atherosclerosis (ICD10-I70.0). 6. Chronic calcific  pancreatitis.  PFTs 08/13/17: FVC 3.32 (85%), FEV1 2.32 (85%), DLCO unc 14.60 (43%).  Preoperative labs noted.   If no acute changes then I anticipate that he can proceed as planned.  George Hugh [facility] Short Stay Center/Anesthesiology Phone [phone] 08/23/2017 10:56 AM

## 2017-08-24 ENCOUNTER — Encounter (HOSPITAL_COMMUNITY): Payer: Self-pay | Admitting: Urology

## 2017-08-24 ENCOUNTER — Inpatient Hospital Stay (HOSPITAL_COMMUNITY): Payer: Medicare Other | Admitting: Certified Registered"

## 2017-08-24 ENCOUNTER — Inpatient Hospital Stay (HOSPITAL_COMMUNITY)
Admission: RE | Admit: 2017-08-24 | Discharge: 2017-09-03 | DRG: 236 | Disposition: A | Discharge status: Skilled Nursing Facility | Payer: Medicare Other | Source: Ambulatory Visit | Attending: Cardiothoracic Surgery | Admitting: Cardiothoracic Surgery

## 2017-08-24 ENCOUNTER — Inpatient Hospital Stay (HOSPITAL_COMMUNITY): Payer: Medicare Other

## 2017-08-24 ENCOUNTER — Encounter (HOSPITAL_COMMUNITY)
Admission: RE | Disposition: A | Discharge status: Skilled Nursing Facility | Payer: Self-pay | Source: Ambulatory Visit | Attending: Cardiothoracic Surgery

## 2017-08-24 DIAGNOSIS — D62 Acute posthemorrhagic anemia: Secondary | ICD-10-CM | POA: Diagnosis not present

## 2017-08-24 DIAGNOSIS — Z9689 Presence of other specified functional implants: Secondary | ICD-10-CM

## 2017-08-24 DIAGNOSIS — J90 Pleural effusion, not elsewhere classified: Secondary | ICD-10-CM

## 2017-08-24 DIAGNOSIS — Z87891 Personal history of nicotine dependence: Secondary | ICD-10-CM

## 2017-08-24 DIAGNOSIS — I25119 Atherosclerotic heart disease of native coronary artery with unspecified angina pectoris: Principal | ICD-10-CM | POA: Diagnosis present

## 2017-08-24 DIAGNOSIS — K219 Gastro-esophageal reflux disease without esophagitis: Secondary | ICD-10-CM | POA: Diagnosis present

## 2017-08-24 DIAGNOSIS — Z09 Encounter for follow-up examination after completed treatment for conditions other than malignant neoplasm: Secondary | ICD-10-CM

## 2017-08-24 DIAGNOSIS — N400 Enlarged prostate without lower urinary tract symptoms: Secondary | ICD-10-CM | POA: Diagnosis present

## 2017-08-24 DIAGNOSIS — I10 Essential (primary) hypertension: Secondary | ICD-10-CM | POA: Diagnosis present

## 2017-08-24 DIAGNOSIS — Z79899 Other long term (current) drug therapy: Secondary | ICD-10-CM

## 2017-08-24 DIAGNOSIS — I714 Abdominal aortic aneurysm, without rupture: Secondary | ICD-10-CM | POA: Diagnosis present

## 2017-08-24 DIAGNOSIS — I252 Old myocardial infarction: Secondary | ICD-10-CM | POA: Diagnosis not present

## 2017-08-24 DIAGNOSIS — Z955 Presence of coronary angioplasty implant and graft: Secondary | ICD-10-CM

## 2017-08-24 DIAGNOSIS — I4891 Unspecified atrial fibrillation: Secondary | ICD-10-CM | POA: Diagnosis present

## 2017-08-24 DIAGNOSIS — Z77090 Contact with and (suspected) exposure to asbestos: Secondary | ICD-10-CM | POA: Diagnosis not present

## 2017-08-24 DIAGNOSIS — E8770 Fluid overload, unspecified: Secondary | ICD-10-CM | POA: Diagnosis not present

## 2017-08-24 DIAGNOSIS — I2510 Atherosclerotic heart disease of native coronary artery without angina pectoris: Secondary | ICD-10-CM

## 2017-08-24 DIAGNOSIS — R1310 Dysphagia, unspecified: Secondary | ICD-10-CM | POA: Diagnosis not present

## 2017-08-24 DIAGNOSIS — Z794 Long term (current) use of insulin: Secondary | ICD-10-CM | POA: Diagnosis not present

## 2017-08-24 DIAGNOSIS — I25110 Atherosclerotic heart disease of native coronary artery with unstable angina pectoris: Secondary | ICD-10-CM | POA: Diagnosis not present

## 2017-08-24 DIAGNOSIS — Z888 Allergy status to other drugs, medicaments and biological substances status: Secondary | ICD-10-CM | POA: Diagnosis not present

## 2017-08-24 DIAGNOSIS — N179 Acute kidney failure, unspecified: Secondary | ICD-10-CM | POA: Diagnosis not present

## 2017-08-24 DIAGNOSIS — Z951 Presence of aortocoronary bypass graft: Secondary | ICD-10-CM

## 2017-08-24 DIAGNOSIS — J449 Chronic obstructive pulmonary disease, unspecified: Secondary | ICD-10-CM | POA: Diagnosis present

## 2017-08-24 HISTORY — PX: CORONARY ARTERY BYPASS GRAFT: SHX141

## 2017-08-24 HISTORY — PX: TEE WITHOUT CARDIOVERSION: SHX5443

## 2017-08-24 LAB — CREATININE, SERUM
Creatinine, Ser: 0.89 mg/dL (ref 0.61–1.24)
GFR calc Af Amer: >60 mL/min (ref >60)
GFR calc non Af Amer: >60 mL/min (ref >60)

## 2017-08-24 LAB — POCT I-STAT 3, ART BLOOD GAS (G3+)
Acid-Base Excess: 1 mmol/L (ref 0.0–2.0)
Acid-base deficit: 2 mmol/L (ref 0.0–2.0)
Acid-base deficit: 5 mmol/L — ABNORMAL HIGH (ref 0.0–2.0)
Acid-base deficit: 4 mmol/L — ABNORMAL HIGH (ref 0.0–2.0)
Acid-base deficit: 5 mmol/L — ABNORMAL HIGH (ref 0.0–2.0)
Bicarbonate: 25.5 mmol/L (ref 20.0–28.0)
Bicarbonate: 23.1 mmol/L (ref 20.0–28.0)
Bicarbonate: 20.7 mmol/L (ref 20.0–28.0)
Bicarbonate: 21.2 mmol/L (ref 20.0–28.0)
Bicarbonate: 20.3 mmol/L (ref 20.0–28.0)
O2 Saturation: 100 %
O2 Saturation: 100 %
O2 Saturation: 97 %
O2 Saturation: 97 %
O2 Saturation: 93 %
Patient temperature: 35.9
Patient temperature: 36.8
Patient temperature: 36.8
TCO2: 27 mmol/L (ref 22–32)
TCO2: 24 mmol/L (ref 22–32)
TCO2: 22 mmol/L (ref 22–32)
TCO2: 22 mmol/L (ref 22–32)
TCO2: 21 mmol/L — ABNORMAL LOW (ref 22–32)
pCO2 arterial: 40.4 mmHg (ref 32.0–48.0)
pCO2 arterial: 38 mmHg (ref 32.0–48.0)
pCO2 arterial: 36.5 mmHg (ref 32.0–48.0)
pCO2 arterial: 39.2 mmHg (ref 32.0–48.0)
pCO2 arterial: 38.4 mmHg (ref 32.0–48.0)
pH, Arterial: 7.409 (ref 7.350–7.450)
pH, Arterial: 7.392 (ref 7.350–7.450)
pH, Arterial: 7.355 (ref 7.350–7.450)
pH, Arterial: 7.341 — ABNORMAL LOW (ref 7.350–7.450)
pH, Arterial: 7.329 — ABNORMAL LOW (ref 7.350–7.450)
pO2, Arterial: 425 mmHg — ABNORMAL HIGH (ref 83.0–108.0)
pO2, Arterial: 239 mmHg — ABNORMAL HIGH (ref 83.0–108.0)
pO2, Arterial: 85 mmHg (ref 83.0–108.0)
pO2, Arterial: 95 mmHg (ref 83.0–108.0)
pO2, Arterial: 71 mmHg — ABNORMAL LOW (ref 83.0–108.0)

## 2017-08-24 LAB — POCT I-STAT, CHEM 8
BUN: 17 mg/dL (ref 6–20)
BUN: 17 mg/dL (ref 6–20)
BUN: 15 mg/dL (ref 6–20)
BUN: 16 mg/dL (ref 6–20)
BUN: 15 mg/dL (ref 6–20)
BUN: 14 mg/dL (ref 6–20)
Calcium, Ion: 1.22 mmol/L (ref 1.15–1.40)
Calcium, Ion: 1.22 mmol/L (ref 1.15–1.40)
Calcium, Ion: 1.03 mmol/L — ABNORMAL LOW (ref 1.15–1.40)
Calcium, Ion: 1.19 mmol/L (ref 1.15–1.40)
Calcium, Ion: 1.07 mmol/L — ABNORMAL LOW (ref 1.15–1.40)
Calcium, Ion: 1.11 mmol/L — ABNORMAL LOW (ref 1.15–1.40)
Chloride: 105 mmol/L (ref 101–111)
Chloride: 105 mmol/L (ref 101–111)
Chloride: 103 mmol/L (ref 101–111)
Chloride: 107 mmol/L (ref 101–111)
Chloride: 103 mmol/L (ref 101–111)
Chloride: 105 mmol/L (ref 101–111)
Creatinine, Ser: 0.9 mg/dL (ref 0.61–1.24)
Creatinine, Ser: 0.9 mg/dL (ref 0.61–1.24)
Creatinine, Ser: 0.8 mg/dL (ref 0.61–1.24)
Creatinine, Ser: 0.9 mg/dL (ref 0.61–1.24)
Creatinine, Ser: 0.7 mg/dL (ref 0.61–1.24)
Creatinine, Ser: 0.9 mg/dL (ref 0.61–1.24)
Glucose, Bld: 118 mg/dL — ABNORMAL HIGH (ref 65–99)
Glucose, Bld: 120 mg/dL — ABNORMAL HIGH (ref 65–99)
Glucose, Bld: 107 mg/dL — ABNORMAL HIGH (ref 65–99)
Glucose, Bld: 117 mg/dL — ABNORMAL HIGH (ref 65–99)
Glucose, Bld: 134 mg/dL — ABNORMAL HIGH (ref 65–99)
Glucose, Bld: 140 mg/dL — ABNORMAL HIGH (ref 65–99)
HCT: 32 % — ABNORMAL LOW (ref 39.0–52.0)
HCT: 30 % — ABNORMAL LOW (ref 39.0–52.0)
HCT: 26 % — ABNORMAL LOW (ref 39.0–52.0)
HCT: 27 % — ABNORMAL LOW (ref 39.0–52.0)
HCT: 26 % — ABNORMAL LOW (ref 39.0–52.0)
HCT: 28 % — ABNORMAL LOW (ref 39.0–52.0)
Hemoglobin: 10.9 g/dL — ABNORMAL LOW (ref 13.0–17.0)
Hemoglobin: 10.2 g/dL — ABNORMAL LOW (ref 13.0–17.0)
Hemoglobin: 8.8 g/dL — ABNORMAL LOW (ref 13.0–17.0)
Hemoglobin: 9.2 g/dL — ABNORMAL LOW (ref 13.0–17.0)
Hemoglobin: 8.8 g/dL — ABNORMAL LOW (ref 13.0–17.0)
Hemoglobin: 9.5 g/dL — ABNORMAL LOW (ref 13.0–17.0)
Potassium: 4 mmol/L (ref 3.5–5.1)
Potassium: 4.1 mmol/L (ref 3.5–5.1)
Potassium: 4 mmol/L (ref 3.5–5.1)
Potassium: 4.2 mmol/L (ref 3.5–5.1)
Potassium: 4.6 mmol/L (ref 3.5–5.1)
Potassium: 4.2 mmol/L (ref 3.5–5.1)
Sodium: 142 mmol/L (ref 135–145)
Sodium: 141 mmol/L (ref 135–145)
Sodium: 140 mmol/L (ref 135–145)
Sodium: 141 mmol/L (ref 135–145)
Sodium: 138 mmol/L (ref 135–145)
Sodium: 140 mmol/L (ref 135–145)
TCO2: 26 mmol/L (ref 22–32)
TCO2: 25 mmol/L (ref 22–32)
TCO2: 26 mmol/L (ref 22–32)
TCO2: 27 mmol/L (ref 22–32)
TCO2: 27 mmol/L (ref 22–32)
TCO2: 24 mmol/L (ref 22–32)

## 2017-08-24 LAB — CBC
HCT: 30.3 % — ABNORMAL LOW (ref 39.0–52.0)
HCT: 32.2 % — ABNORMAL LOW (ref 39.0–52.0)
Hemoglobin: 9.9 g/dL — ABNORMAL LOW (ref 13.0–17.0)
Hemoglobin: 10.6 g/dL — ABNORMAL LOW (ref 13.0–17.0)
MCH: 28.9 pg (ref 26.0–34.0)
MCH: 29 pg (ref 26.0–34.0)
MCHC: 32.7 g/dL (ref 30.0–36.0)
MCHC: 32.9 g/dL (ref 30.0–36.0)
MCV: 88.3 fL (ref 78.0–100.0)
MCV: 88 fL (ref 78.0–100.0)
Platelets: 152 10*3/uL (ref 150–400)
Platelets: 178 10*3/uL (ref 150–400)
RBC: 3.43 MIL/uL — ABNORMAL LOW (ref 4.22–5.81)
RBC: 3.66 MIL/uL — ABNORMAL LOW (ref 4.22–5.81)
RDW: 14 % (ref 11.5–15.5)
RDW: 14 % (ref 11.5–15.5)
WBC: 11.7 10*3/uL — ABNORMAL HIGH (ref 4.0–10.5)
WBC: 13.4 10*3/uL — ABNORMAL HIGH (ref 4.0–10.5)

## 2017-08-24 LAB — MAGNESIUM: Magnesium: 2.8 mg/dL — ABNORMAL HIGH (ref 1.7–2.4)

## 2017-08-24 LAB — GLUCOSE, CAPILLARY
Glucose-Capillary: 108 mg/dL — ABNORMAL HIGH (ref 65–99)
Glucose-Capillary: 106 mg/dL — ABNORMAL HIGH (ref 65–99)
Glucose-Capillary: 112 mg/dL — ABNORMAL HIGH (ref 65–99)
Glucose-Capillary: 121 mg/dL — ABNORMAL HIGH (ref 65–99)
Glucose-Capillary: 133 mg/dL — ABNORMAL HIGH (ref 65–99)
Glucose-Capillary: 119 mg/dL — ABNORMAL HIGH (ref 65–99)
Glucose-Capillary: 144 mg/dL — ABNORMAL HIGH (ref 65–99)
Glucose-Capillary: 159 mg/dL — ABNORMAL HIGH (ref 65–99)

## 2017-08-24 LAB — POCT I-STAT 4, (NA,K, GLUC, HGB,HCT)
Glucose, Bld: 113 mg/dL — ABNORMAL HIGH (ref 65–99)
HCT: 29 % — ABNORMAL LOW (ref 39.0–52.0)
Hemoglobin: 9.9 g/dL — ABNORMAL LOW (ref 13.0–17.0)
Potassium: 3.9 mmol/L (ref 3.5–5.1)
Sodium: 142 mmol/L (ref 135–145)

## 2017-08-24 LAB — PROTIME-INR
INR: 1.39
Prothrombin Time: 17 seconds — ABNORMAL HIGH (ref 11.4–15.2)

## 2017-08-24 LAB — PLATELET COUNT: Platelets: 175 10*3/uL (ref 150–400)

## 2017-08-24 LAB — HEMOGLOBIN AND HEMATOCRIT, BLOOD
HCT: 25.9 % — ABNORMAL LOW (ref 39.0–52.0)
Hemoglobin: 8.6 g/dL — ABNORMAL LOW (ref 13.0–17.0)

## 2017-08-24 LAB — APTT: aPTT: 32 seconds (ref 24–36)

## 2017-08-24 SURGERY — CORONARY ARTERY BYPASS GRAFTING (CABG)
Anesthesia: General | Site: Chest

## 2017-08-24 MED ORDER — BISACODYL 10 MG RE SUPP: 10.0000 mg | Freq: Every day | RECTAL | Status: DC

## 2017-08-24 MED ORDER — HEPARIN SODIUM (PORCINE) 1000 UNIT/ML IJ SOLN
INTRAMUSCULAR | Status: AC
Start: 2017-08-24 — End: 2017-08-24
  Filled 2017-08-24: qty 1

## 2017-08-24 MED ORDER — SODIUM CHLORIDE 0.9% FLUSH: 3.0000 mL | INTRAVENOUS | Status: DC | PRN

## 2017-08-24 MED ORDER — PROPOFOL 10 MG/ML IV BOLUS
INTRAVENOUS | Status: AC
Start: 2017-08-24 — End: 2017-08-24
  Filled 2017-08-24: qty 20

## 2017-08-24 MED ORDER — METOPROLOL TARTRATE 12.5 MG HALF TABLET
12.5000 mg | ORAL_TABLET | Freq: Two times a day (BID) | ORAL | Status: DC
  Administered 2017-08-25: 12.5 mg via ORAL
  Filled 2017-08-24: qty 1

## 2017-08-24 MED ORDER — FAMOTIDINE IN NACL 20-0.9 MG/50ML-% IV SOLN
20.0000 mg | Freq: Two times a day (BID) | INTRAVENOUS | Status: DC
  Administered 2017-08-24: 20 mg via INTRAVENOUS

## 2017-08-24 MED ORDER — ACETAMINOPHEN 160 MG/5ML PO SOLN: 650.0000 mg | Freq: Once | ORAL | Status: AC

## 2017-08-24 MED ORDER — MIDAZOLAM HCL 5 MG/5ML IJ SOLN
INTRAMUSCULAR | Status: DC | PRN
  Administered 2017-08-24 (×2): 1 mg via INTRAVENOUS
  Administered 2017-08-24: 2 mg via INTRAVENOUS
  Administered 2017-08-24: 1 mg via INTRAVENOUS

## 2017-08-24 MED ORDER — LACTATED RINGERS IV SOLN
INTRAVENOUS | Status: DC | PRN
  Administered 2017-08-24 (×2): via INTRAVENOUS

## 2017-08-24 MED ORDER — HEMOSTATIC AGENTS (NO CHARGE) OPTIME
TOPICAL | Status: DC | PRN
  Administered 2017-08-24: 1 via TOPICAL

## 2017-08-24 MED ORDER — FINASTERIDE 5 MG PO TABS: 5.0000 mg | ORAL_TABLET | Freq: Two times a day (BID) | ORAL | Status: DC

## 2017-08-24 MED ORDER — SODIUM CHLORIDE 0.9 % IV SOLN: INTRAVENOUS | Status: DC

## 2017-08-24 MED ORDER — ONDANSETRON HCL 4 MG/2ML IJ SOLN
4.0000 mg | Freq: Four times a day (QID) | INTRAMUSCULAR | Status: DC | PRN
  Administered 2017-08-25: 4 mg via INTRAVENOUS
  Filled 2017-08-24: qty 2

## 2017-08-24 MED ORDER — DEXMEDETOMIDINE HCL 200 MCG/2ML IV SOLN
0.0000 ug/kg/h | INTRAVENOUS | Status: DC
  Administered 2017-08-24: 0.3 ug/kg/h via INTRAVENOUS
  Filled 2017-08-24 (×2): qty 2

## 2017-08-24 MED ORDER — MIDAZOLAM HCL 10 MG/2ML IJ SOLN
INTRAMUSCULAR | Status: AC
  Filled 2017-08-24: qty 2

## 2017-08-24 MED ORDER — TRAMADOL HCL 50 MG PO TABS
50.0000 mg | ORAL_TABLET | ORAL | Status: DC | PRN
  Administered 2017-08-24 – 2017-08-25 (×4): 50 mg via ORAL
  Filled 2017-08-24 (×4): qty 1

## 2017-08-24 MED ORDER — ROCURONIUM BROMIDE 10 MG/ML (PF) SYRINGE
PREFILLED_SYRINGE | INTRAVENOUS | Status: AC
Start: 2017-08-24 — End: 2017-08-24
  Filled 2017-08-24: qty 10

## 2017-08-24 MED ORDER — THROMBIN (RECOMBINANT) 5000 UNITS EX SOLR
CUTANEOUS | Status: DC | PRN
  Administered 2017-08-24: 5000 [IU] via TOPICAL

## 2017-08-24 MED ORDER — LACTATED RINGERS IV SOLN: INTRAVENOUS | Status: DC

## 2017-08-24 MED ORDER — LEVALBUTEROL HCL 0.63 MG/3ML IN NEBU
0.6300 mg | INHALATION_SOLUTION | Freq: Three times a day (TID) | RESPIRATORY_TRACT | Status: DC | PRN
  Administered 2017-08-25: 0.63 mg via RESPIRATORY_TRACT
  Filled 2017-08-24: qty 3

## 2017-08-24 MED ORDER — ACETAMINOPHEN 650 MG RE SUPP
650.0000 mg | Freq: Once | RECTAL | Status: AC
  Administered 2017-08-24: 650 mg via RECTAL

## 2017-08-24 MED ORDER — LACTATED RINGERS IV SOLN
500.0000 mL | Freq: Once | INTRAVENOUS | Status: AC | PRN
  Administered 2017-08-25: 500 mL via INTRAVENOUS

## 2017-08-24 MED ORDER — FENTANYL CITRATE (PF) 250 MCG/5ML IJ SOLN
INTRAMUSCULAR | Status: AC
  Filled 2017-08-24: qty 5

## 2017-08-24 MED ORDER — LACTATED RINGERS IV SOLN
INTRAVENOUS | Status: DC | PRN
  Administered 2017-08-24: 07:00:00 via INTRAVENOUS

## 2017-08-24 MED ORDER — METOPROLOL TARTRATE 25 MG/10 ML ORAL SUSPENSION: 12.5000 mg | Freq: Two times a day (BID) | ORAL | Status: DC

## 2017-08-24 MED ORDER — ARTIFICIAL TEARS OPHTHALMIC OINT
TOPICAL_OINTMENT | OPHTHALMIC | Status: DC | PRN
Start: 2017-08-24 — End: 2017-08-24
  Administered 2017-08-24: 1 via OPHTHALMIC

## 2017-08-24 MED ORDER — ARTIFICIAL TEARS OPHTHALMIC OINT
TOPICAL_OINTMENT | OPHTHALMIC | Status: AC
Start: 2017-08-24 — End: 2017-08-24
  Filled 2017-08-24: qty 3.5

## 2017-08-24 MED ORDER — PHENYLEPHRINE 40 MCG/ML (10ML) SYRINGE FOR IV PUSH (FOR BLOOD PRESSURE SUPPORT)
PREFILLED_SYRINGE | INTRAVENOUS | Status: DC | PRN
  Administered 2017-08-24 (×2): 40 ug via INTRAVENOUS

## 2017-08-24 MED ORDER — DOCUSATE SODIUM 100 MG PO CAPS
200.0000 mg | ORAL_CAPSULE | Freq: Every day | ORAL | Status: DC
  Administered 2017-08-25 – 2017-08-29 (×4): 200 mg via ORAL
  Filled 2017-08-24 (×5): qty 2

## 2017-08-24 MED ORDER — ALBUTEROL SULFATE HFA 108 (90 BASE) MCG/ACT IN AERS
INHALATION_SPRAY | RESPIRATORY_TRACT | Status: DC | PRN
  Administered 2017-08-24: 2 via RESPIRATORY_TRACT

## 2017-08-24 MED ORDER — EPHEDRINE SULFATE-NACL 50-0.9 MG/10ML-% IV SOSY
PREFILLED_SYRINGE | INTRAVENOUS | Status: DC | PRN
  Administered 2017-08-24: 10 mg via INTRAVENOUS
  Administered 2017-08-24: 5 mg via INTRAVENOUS

## 2017-08-24 MED ORDER — 0.9 % SODIUM CHLORIDE (POUR BTL) OPTIME
TOPICAL | Status: DC | PRN
  Administered 2017-08-24: 6000 mL

## 2017-08-24 MED ORDER — MIDAZOLAM HCL 2 MG/2ML IJ SOLN: 2.0000 mg | INTRAMUSCULAR | Status: DC | PRN

## 2017-08-24 MED ORDER — CHLORHEXIDINE GLUCONATE CLOTH 2 % EX PADS
6.0000 | MEDICATED_PAD | Freq: Every day | CUTANEOUS | Status: DC
  Administered 2017-08-24 – 2017-09-03 (×10): 6 via TOPICAL

## 2017-08-24 MED ORDER — DEXMEDETOMIDINE HCL IN NACL 200 MCG/50ML IV SOLN
INTRAVENOUS | Status: AC
  Filled 2017-08-24: qty 50

## 2017-08-24 MED ORDER — PHENYLEPHRINE HCL 10 MG/ML IJ SOLN
INTRAVENOUS | Status: DC | PRN
  Administered 2017-08-24: 25 ug/min via INTRAVENOUS

## 2017-08-24 MED ORDER — MORPHINE SULFATE (PF) 4 MG/ML IV SOLN
1.0000 mg | INTRAVENOUS | Status: AC | PRN
  Administered 2017-08-25: 2 mg via INTRAVENOUS
  Filled 2017-08-24: qty 1

## 2017-08-24 MED ORDER — CHLORHEXIDINE GLUCONATE 0.12% ORAL RINSE (MEDLINE KIT): 15.0000 mL | Freq: Two times a day (BID) | OROMUCOSAL | Status: DC

## 2017-08-24 MED ORDER — SODIUM BICARBONATE 8.4 % IV SOLN
25.0000 meq | Freq: Once | INTRAVENOUS | Status: AC
  Administered 2017-08-24: 25 meq via INTRAVENOUS

## 2017-08-24 MED ORDER — FENTANYL CITRATE (PF) 250 MCG/5ML IJ SOLN
INTRAMUSCULAR | Status: AC
  Filled 2017-08-24: qty 25

## 2017-08-24 MED ORDER — SODIUM CHLORIDE 0.9% FLUSH
3.0000 mL | Freq: Two times a day (BID) | INTRAVENOUS | Status: DC
  Administered 2017-08-25 – 2017-08-29 (×10): 3 mL via INTRAVENOUS

## 2017-08-24 MED ORDER — ORAL CARE MOUTH RINSE
15.0000 mL | OROMUCOSAL | Status: DC
  Administered 2017-08-24 (×2): 15 mL via OROMUCOSAL

## 2017-08-24 MED ORDER — TAMSULOSIN HCL 0.4 MG PO CAPS
0.4000 mg | ORAL_CAPSULE | Freq: Two times a day (BID) | ORAL | Status: DC
  Administered 2017-08-25 – 2017-09-03 (×19): 0.4 mg via ORAL
  Filled 2017-08-24 (×19): qty 1

## 2017-08-24 MED ORDER — ICAPS PO CAPS: 1.0000 | ORAL_CAPSULE | Freq: Every day | ORAL | Status: DC

## 2017-08-24 MED ORDER — ROCURONIUM BROMIDE 10 MG/ML (PF) SYRINGE
PREFILLED_SYRINGE | INTRAVENOUS | Status: DC | PRN
  Administered 2017-08-24 (×4): 50 mg via INTRAVENOUS

## 2017-08-24 MED ORDER — ASPIRIN 81 MG PO CHEW: 324.0000 mg | CHEWABLE_TABLET | Freq: Every day | ORAL | Status: DC

## 2017-08-24 MED ORDER — MORPHINE SULFATE (PF) 2 MG/ML IV SOLN: 2.0000 mg | INTRAVENOUS | Status: DC | PRN

## 2017-08-24 MED ORDER — THROMBIN (RECOMBINANT) 5000 UNITS EX SOLR
CUTANEOUS | Status: AC
  Filled 2017-08-24: qty 5000

## 2017-08-24 MED ORDER — MORPHINE SULFATE (PF) 4 MG/ML IV SOLN
2.0000 mg | INTRAVENOUS | Status: DC | PRN
  Administered 2017-08-24: 2 mg via INTRAVENOUS
  Filled 2017-08-24: qty 1

## 2017-08-24 MED ORDER — VANCOMYCIN HCL IN DEXTROSE 1-5 GM/200ML-% IV SOLN
1000.0000 mg | Freq: Once | INTRAVENOUS | Status: AC
  Administered 2017-08-24: 1000 mg via INTRAVENOUS
  Filled 2017-08-24 (×2): qty 200

## 2017-08-24 MED ORDER — SODIUM CHLORIDE 0.9% FLUSH
10.0000 mL | Freq: Two times a day (BID) | INTRAVENOUS | Status: DC
  Administered 2017-08-24 – 2017-08-28 (×9): 10 mL
  Administered 2017-08-29: 20 mL
  Administered 2017-08-29 (×2): 10 mL

## 2017-08-24 MED ORDER — CEFUROXIME SODIUM 1.5 G IV SOLR
1.5000 g | Freq: Two times a day (BID) | INTRAVENOUS | Status: AC
  Administered 2017-08-24 – 2017-08-26 (×4): 1.5 g via INTRAVENOUS
  Filled 2017-08-24 (×5): qty 1.5

## 2017-08-24 MED ORDER — CHLORHEXIDINE GLUCONATE 0.12 % MT SOLN
15.0000 mL | OROMUCOSAL | Status: AC
  Administered 2017-08-24: 15 mL via OROMUCOSAL
  Filled 2017-08-24: qty 15

## 2017-08-24 MED ORDER — MAGNESIUM SULFATE 4 GM/100ML IV SOLN
INTRAVENOUS | Status: AC
  Administered 2017-08-24: 4 g via INTRAVENOUS
  Filled 2017-08-24: qty 100

## 2017-08-24 MED ORDER — MORPHINE SULFATE (PF) 2 MG/ML IV SOLN: 1.0000 mg | INTRAVENOUS | Status: DC | PRN

## 2017-08-24 MED ORDER — ALBUMIN HUMAN 5 % IV SOLN
INTRAVENOUS | Status: DC | PRN
  Administered 2017-08-24: 13:00:00 via INTRAVENOUS

## 2017-08-24 MED ORDER — LIDOCAINE 2% (20 MG/ML) 5 ML SYRINGE
INTRAMUSCULAR | Status: AC
  Filled 2017-08-24: qty 5

## 2017-08-24 MED ORDER — SODIUM CHLORIDE 0.9% FLUSH: 10.0000 mL | INTRAVENOUS | Status: DC | PRN

## 2017-08-24 MED ORDER — PROPOFOL 10 MG/ML IV BOLUS
INTRAVENOUS | Status: DC | PRN
  Administered 2017-08-24: 20 mg via INTRAVENOUS

## 2017-08-24 MED ORDER — ORAL CARE MOUTH RINSE
15.0000 mL | Freq: Two times a day (BID) | OROMUCOSAL | Status: DC
  Administered 2017-08-24 – 2017-09-03 (×14): 15 mL via OROMUCOSAL

## 2017-08-24 MED ORDER — OXYCODONE HCL 5 MG PO TABS
5.0000 mg | ORAL_TABLET | ORAL | Status: DC | PRN
  Administered 2017-08-25 – 2017-08-26 (×4): 5 mg via ORAL
  Filled 2017-08-24 (×4): qty 1

## 2017-08-24 MED ORDER — INSULIN REGULAR BOLUS VIA INFUSION
0.0000 [IU] | Freq: Three times a day (TID) | INTRAVENOUS | Status: DC
  Filled 2017-08-24: qty 10

## 2017-08-24 MED ORDER — ACETAMINOPHEN 500 MG PO TABS
1000.0000 mg | ORAL_TABLET | Freq: Four times a day (QID) | ORAL | Status: AC
  Administered 2017-08-25 – 2017-08-29 (×14): 1000 mg via ORAL
  Filled 2017-08-24 (×14): qty 2

## 2017-08-24 MED ORDER — BISACODYL 5 MG PO TBEC
10.0000 mg | DELAYED_RELEASE_TABLET | Freq: Every day | ORAL | Status: DC
  Administered 2017-08-25 – 2017-08-29 (×4): 10 mg via ORAL
  Filled 2017-08-24 (×5): qty 2

## 2017-08-24 MED ORDER — CHLORHEXIDINE GLUCONATE 4 % EX LIQD: 30.0000 mL | CUTANEOUS | Status: DC

## 2017-08-24 MED ORDER — HEPARIN SODIUM (PORCINE) 1000 UNIT/ML IJ SOLN
INTRAMUSCULAR | Status: DC | PRN
  Administered 2017-08-24: 32000 [IU] via INTRAVENOUS

## 2017-08-24 MED ORDER — PROPOFOL 10 MG/ML IV BOLUS: INTRAVENOUS | Status: DC | PRN

## 2017-08-24 MED ORDER — NITROGLYCERIN IN D5W 200-5 MCG/ML-% IV SOLN: 0.0000 ug/min | INTRAVENOUS | Status: DC

## 2017-08-24 MED ORDER — SODIUM CHLORIDE 0.9 % IV SOLN
INTRAVENOUS | Status: DC | PRN
  Administered 2017-08-24: 13:00:00 via INTRAVENOUS

## 2017-08-24 MED ORDER — SODIUM CHLORIDE 0.9 % IV SOLN: 250.0000 mL | INTRAVENOUS | Status: DC

## 2017-08-24 MED ORDER — PANTOPRAZOLE SODIUM 40 MG PO TBEC
40.0000 mg | DELAYED_RELEASE_TABLET | Freq: Every day | ORAL | Status: DC
  Administered 2017-08-25 – 2017-08-29 (×5): 40 mg via ORAL
  Filled 2017-08-24 (×6): qty 1

## 2017-08-24 MED ORDER — METOPROLOL TARTRATE 5 MG/5ML IV SOLN
2.5000 mg | INTRAVENOUS | Status: DC | PRN
  Administered 2017-08-28 (×2): 2.5 mg via INTRAVENOUS
  Administered 2017-08-28 (×2): 5 mg via INTRAVENOUS
  Administered 2017-08-28: 2.5 mg via INTRAVENOUS
  Filled 2017-08-24 (×4): qty 5

## 2017-08-24 MED ORDER — MIDAZOLAM HCL 2 MG/2ML IJ SOLN
INTRAMUSCULAR | Status: AC
Start: 2017-08-24 — End: 2017-08-24
  Filled 2017-08-24: qty 2

## 2017-08-24 MED ORDER — LACTATED RINGERS IV SOLN
INTRAVENOUS | Status: DC
  Administered 2017-08-25: 05:00:00 via INTRAVENOUS

## 2017-08-24 MED ORDER — SODIUM CHLORIDE 0.9 % IV SOLN
0.0000 ug/min | INTRAVENOUS | Status: DC
  Administered 2017-08-25: 40 ug/min via INTRAVENOUS
  Filled 2017-08-24: qty 2

## 2017-08-24 MED ORDER — ACETAMINOPHEN 160 MG/5ML PO SOLN: 1000.0000 mg | Freq: Four times a day (QID) | ORAL | Status: AC

## 2017-08-24 MED ORDER — POTASSIUM CHLORIDE 10 MEQ/50ML IV SOLN
10.0000 meq | INTRAVENOUS | Status: AC
  Administered 2017-08-24 (×3): 10 meq via INTRAVENOUS

## 2017-08-24 MED ORDER — AZELASTINE HCL 0.1 % NA SOLN
2.0000 | Freq: Two times a day (BID) | NASAL | Status: DC
  Administered 2017-08-26 – 2017-09-03 (×17): 2 via NASAL
  Filled 2017-08-24 (×3): qty 30

## 2017-08-24 MED ORDER — DOXYCYCLINE HYCLATE 20 MG PO TABS: 20.0000 mg | ORAL_TABLET | Freq: Every day | ORAL | Status: DC

## 2017-08-24 MED ORDER — FLUTICASONE FUROATE-VILANTEROL 100-25 MCG/INH IN AEPB
1.0000 | INHALATION_SPRAY | Freq: Every day | RESPIRATORY_TRACT | Status: DC
  Administered 2017-08-26 – 2017-09-03 (×9): 1 via RESPIRATORY_TRACT
  Filled 2017-08-24 (×3): qty 28

## 2017-08-24 MED ORDER — ALBUMIN HUMAN 5 % IV SOLN
250.0000 mL | INTRAVENOUS | Status: AC | PRN
  Administered 2017-08-25 (×2): 250 mL via INTRAVENOUS
  Filled 2017-08-24: qty 250

## 2017-08-24 MED ORDER — SODIUM CHLORIDE 0.45 % IV SOLN
INTRAVENOUS | Status: DC | PRN
  Administered 2017-08-29: 10 mL/h via INTRAVENOUS

## 2017-08-24 MED ORDER — MAGNESIUM SULFATE 4 GM/100ML IV SOLN
4.0000 g | Freq: Once | INTRAVENOUS | Status: AC
  Administered 2017-08-24: 4 g via INTRAVENOUS
  Filled 2017-08-24: qty 100

## 2017-08-24 MED ORDER — SODIUM CHLORIDE 0.9 % IJ SOLN
OROMUCOSAL | Status: DC | PRN
  Administered 2017-08-24 (×3): via TOPICAL

## 2017-08-24 MED ORDER — ASPIRIN EC 325 MG PO TBEC
325.0000 mg | DELAYED_RELEASE_TABLET | Freq: Every day | ORAL | Status: DC
  Administered 2017-08-25 – 2017-08-29 (×5): 325 mg via ORAL
  Filled 2017-08-24 (×6): qty 1

## 2017-08-24 MED ORDER — PROTAMINE SULFATE 10 MG/ML IV SOLN
INTRAVENOUS | Status: DC | PRN
  Administered 2017-08-24: 100 mg via INTRAVENOUS
  Administered 2017-08-24 (×2): 50 mg via INTRAVENOUS
  Administered 2017-08-24: 100 mg via INTRAVENOUS

## 2017-08-24 MED ORDER — FENTANYL CITRATE (PF) 100 MCG/2ML IJ SOLN
INTRAMUSCULAR | Status: DC | PRN
  Administered 2017-08-24 (×3): 25 ug via INTRAVENOUS
  Administered 2017-08-24: 50 ug via INTRAVENOUS
  Administered 2017-08-24: 25 ug via INTRAVENOUS
  Administered 2017-08-24: 100 ug via INTRAVENOUS
  Administered 2017-08-24 (×3): 50 ug via INTRAVENOUS
  Administered 2017-08-24: 150 ug via INTRAVENOUS
  Administered 2017-08-24 (×3): 25 ug via INTRAVENOUS
  Administered 2017-08-24: 50 ug via INTRAVENOUS
  Administered 2017-08-24: 1000 ug via INTRAVENOUS

## 2017-08-24 SURGICAL SUPPLY — 75 items
ADH SKN CLS APL DERMABOND .7 (GAUZE/BANDAGES/DRESSINGS) ×2
BAG DECANTER FOR FLEXI CONT (MISCELLANEOUS) ×3 IMPLANT
BANDAGE ACE 4X5 VEL STRL LF (GAUZE/BANDAGES/DRESSINGS) ×3 IMPLANT
BANDAGE ACE 6X5 VEL STRL LF (GAUZE/BANDAGES/DRESSINGS) ×3 IMPLANT
BANDAGE HEMOSTAT MRDH 4X4 STRL (MISCELLANEOUS) IMPLANT
BLADE NDL 3 SS STRL (BLADE) IMPLANT
BLADE NEEDLE 3 SS STRL (BLADE) ×3 IMPLANT
BLADE STERNUM SYSTEM 6 (BLADE) ×3 IMPLANT
BNDG GAUZE ELAST 4 BULKY (GAUZE/BANDAGES/DRESSINGS) ×3 IMPLANT
BNDG HEMOSTAT MRDH 4X4 STRL (MISCELLANEOUS) ×3
CANISTER SUCT 3000ML PPV (MISCELLANEOUS) ×3 IMPLANT
CATH CPB KIT GERHARDT (MISCELLANEOUS) ×3 IMPLANT
CATH THORACIC 28FR (CATHETERS) ×3 IMPLANT
CLIP VESOCCLUDE SM WIDE 24/CT (CLIP) ×4 IMPLANT
CRADLE DONUT ADULT HEAD (MISCELLANEOUS) ×3 IMPLANT
DERMABOND ADVANCED (GAUZE/BANDAGES/DRESSINGS) ×1
DERMABOND ADVANCED .7 DNX12 (GAUZE/BANDAGES/DRESSINGS) IMPLANT
DRAIN CHANNEL 28F RND 3/8 FF (WOUND CARE) ×3 IMPLANT
DRAPE CARDIOVASCULAR INCISE (DRAPES) ×3
DRAPE SLUSH/WARMER DISC (DRAPES) ×3 IMPLANT
DRAPE SRG 135X102X78XABS (DRAPES) ×2 IMPLANT
DRSG AQUACEL AG ADV 3.5X14 (GAUZE/BANDAGES/DRESSINGS) ×3 IMPLANT
ELECT BLADE 4.0 EZ CLEAN MEGAD (MISCELLANEOUS) ×3
ELECT REM PT RETURN 9FT ADLT (ELECTROSURGICAL) ×6
ELECTRODE BLDE 4.0 EZ CLN MEGD (MISCELLANEOUS) ×2 IMPLANT
ELECTRODE REM PT RTRN 9FT ADLT (ELECTROSURGICAL) ×4 IMPLANT
FELT TEFLON 1X6 (MISCELLANEOUS) ×6 IMPLANT
GAUZE SPONGE 4X4 12PLY STRL (GAUZE/BANDAGES/DRESSINGS) ×6 IMPLANT
GAUZE SPONGE 4X4 12PLY STRL LF (GAUZE/BANDAGES/DRESSINGS) ×2 IMPLANT
GLOVE BIO SURGEON STRL SZ 6 (GLOVE) ×3 IMPLANT
GLOVE BIO SURGEON STRL SZ 6.5 (GLOVE) ×10 IMPLANT
GLOVE BIO SURGEON STRL SZ7 (GLOVE) ×1 IMPLANT
GOWN STRL REUS W/ TWL LRG LVL3 (GOWN DISPOSABLE) ×8 IMPLANT
GOWN STRL REUS W/TWL LRG LVL3 (GOWN DISPOSABLE) ×21
HEMOSTAT POWDER SURGIFOAM 1G (HEMOSTASIS) ×9 IMPLANT
HEMOSTAT SURGICEL 2X14 (HEMOSTASIS) ×3 IMPLANT
KIT BASIN OR (CUSTOM PROCEDURE TRAY) ×3 IMPLANT
KIT CATH SUCT 8FR (CATHETERS) ×3 IMPLANT
KIT ROOM TURNOVER OR (KITS) ×3 IMPLANT
KIT SUCTION CATH 14FR (SUCTIONS) ×6 IMPLANT
KIT VASOVIEW HEMOPRO VH 3000 (KITS) ×3 IMPLANT
LEAD PACING MYOCARDI (MISCELLANEOUS) ×3 IMPLANT
MARKER GRAFT CORONARY BYPASS (MISCELLANEOUS) ×9 IMPLANT
NS IRRIG 1000ML POUR BTL (IV SOLUTION) ×16 IMPLANT
PACK E OPEN HEART (SUTURE) ×3 IMPLANT
PACK OPEN HEART (CUSTOM PROCEDURE TRAY) ×3 IMPLANT
PAD ARMBOARD 7.5X6 YLW CONV (MISCELLANEOUS) ×6 IMPLANT
PAD ELECT DEFIB RADIOL ZOLL (MISCELLANEOUS) ×3 IMPLANT
PENCIL BUTTON HOLSTER BLD 10FT (ELECTRODE) ×3 IMPLANT
PUNCH AORTIC ROTATE  4.5MM 8IN (MISCELLANEOUS) ×1 IMPLANT
SET CARDIOPLEGIA MPS 5001102 (MISCELLANEOUS) ×1 IMPLANT
SPONGE LAP 18X18 X RAY DECT (DISPOSABLE) ×4 IMPLANT
SUT BONE WAX W31G (SUTURE) ×3 IMPLANT
SUT MNCRL AB 4-0 PS2 18 (SUTURE) ×1 IMPLANT
SUT PROLENE 3 0 SH1 36 (SUTURE) ×3 IMPLANT
SUT PROLENE 4 0 TF (SUTURE) ×7 IMPLANT
SUT PROLENE 6 0 C 1 30 (SUTURE) ×2 IMPLANT
SUT PROLENE 6 0 CC (SUTURE) ×10 IMPLANT
SUT PROLENE 7 0 BV1 MDA (SUTURE) ×4 IMPLANT
SUT PROLENE 8 0 BV175 6 (SUTURE) ×1 IMPLANT
SUT STEEL 6MS V (SUTURE) ×3 IMPLANT
SUT STEEL SZ 6 DBL 3X14 BALL (SUTURE) ×3 IMPLANT
SUT VIC AB 1 CTX 18 (SUTURE) ×6 IMPLANT
SUT VIC AB 2-0 CT1 27 (SUTURE) ×6
SUT VIC AB 2-0 CT1 TAPERPNT 27 (SUTURE) IMPLANT
SYSTEM SAHARA CHEST DRAIN ATS (WOUND CARE) ×3 IMPLANT
TAPE CLOTH SURG 4X10 WHT LF (GAUZE/BANDAGES/DRESSINGS) ×1 IMPLANT
TOWEL GREEN STERILE (TOWEL DISPOSABLE) ×8 IMPLANT
TOWEL GREEN STERILE FF (TOWEL DISPOSABLE) ×5 IMPLANT
TOWEL OR 17X24 6PK STRL BLUE (TOWEL DISPOSABLE) ×4 IMPLANT
TOWEL OR 17X26 10 PK STRL BLUE (TOWEL DISPOSABLE) ×4 IMPLANT
TRAY FOLEY SILVER 16FR TEMP (SET/KITS/TRAYS/PACK) ×3 IMPLANT
TUBING INSUFFLATION (TUBING) ×3 IMPLANT
UNDERPAD 30X30 (UNDERPADS AND DIAPERS) ×3 IMPLANT
WATER STERILE IRR 1000ML POUR (IV SOLUTION) ×6 IMPLANT

## 2017-08-24 NOTE — Anesthesia Procedure Notes (Signed)
Central Venous Catheter Insertion Performed by: Roderic Palau, MD, anesthesiologist Start/End12/07/2017 7:00 AM, 08/24/2017 7:15 AM Patient location: Pre-op. Preanesthetic checklist: patient identified, IV checked, site marked, risks and benefits discussed, surgical consent, monitors and equipment checked, pre-op evaluation, timeout performed and anesthesia consent Position: Trendelenburg Lidocaine 1% used for infiltration and patient sedated Hand hygiene performed , maximum sterile barriers used  and Seldinger technique used Catheter size: 9 Fr Total catheter length 10. Central line was placed.MAC introducer Procedure performed using ultrasound guided technique. Ultrasound Notes:anatomy identified, needle tip was noted to be adjacent to the nerve/plexus identified, no ultrasound evidence of intravascular and/or intraneural injection and image(s) printed for medical record Attempts: 1 Following insertion, line sutured, dressing applied and Biopatch. Post procedure assessment: blood return through all ports, free fluid flow and no air  Patient tolerated the procedure well with no immediate complications.

## 2017-08-24 NOTE — OR Nursing (Signed)
2nd call to SICU 1308

## 2017-08-24 NOTE — Addendum Note (Signed)
Addendum  created 08/24/17 1550 by Josephine Igo, CRNA   Sign clinical note

## 2017-08-24 NOTE — Brief Op Note (Signed)
      [address]       [place] [address]             [phone]      08/24/2017  1:17 PM  PATIENT:  Tim [name]  83 y.o. male  PRE-OPERATIVE DIAGNOSIS:  Coronary Artery Disease  POST-OPERATIVE DIAGNOSIS:  Coronary Artery Disease  PROCEDURE:  TRANSESOPHAGEAL ECHOCARDIOGRAM (TEE), MEDIAN STERNOTOMY for CORONARY ARTERY BYPASS GRAFTING x 4 (LIMA to LAD, SVG to DIAGONAL, SVG to CIRCUMFLEX, SVG to PDA) using left internal mammary artery and bilateral greater saphenous vein via EVH  SURGEON:  Surgeon(s) and Role:   Grace Isaac, MD - Primary  PHYSICIAN ASSISTANT: Lars Pinks PA-C  ANESTHESIA:   general  EBL:   396ml  DRAINS: Chest tubes placed in the mediastinal and pleural spaces   COUNTS CORRECT:  YES  DICTATION: .Dragon Dictation  PLAN OF CARE: Admit to inpatient   PATIENT DISPOSITION:  ICU - intubated and hemodynamically stable.   Delay start of Pharmacological VTE agent (>24hrs) due to surgical blood loss or risk of bleeding: yes  BASELINE WEIGHT: 90 kg

## 2017-08-24 NOTE — Anesthesia Postprocedure Evaluation (Signed)
Anesthesia Post Note  Patient: Tim [name]  Procedure(s) Performed: CORONARY ARTERY BYPASS GRAFTING times four using left internal mammary artery and bilateral saphenous vein, using endoscope. TEE (N/A Chest) TRANSESOPHAGEAL ECHOCARDIOGRAM (TEE) (N/A )     Patient location during evaluation: SICU Anesthesia Type: General Level of consciousness: sedated Pain management: pain level controlled Vital Signs Assessment: post-procedure vital signs reviewed and stable Respiratory status: patient remains intubated per anesthesia plan Cardiovascular status: stable Postop Assessment: no apparent nausea or vomiting Anesthetic complications: no    Last Vitals:  Vitals:   08/24/17 0546 08/24/17 1345  BP: (!) 166/83   Pulse: 74   Resp: 20   Temp: (!) 36.4 C   SpO2: 97% 93%    Last Pain:  Vitals:   08/24/17 0546  TempSrc: Oral                 OSSEY,KEVIN DAVID

## 2017-08-24 NOTE — Progress Notes (Signed)
  Echocardiogram Echocardiogram Transesophageal has been performed.  Tim [name] 08/24/2017, 9:29 AM

## 2017-08-24 NOTE — OR Nursing (Signed)
1243 1st call to SICU charge nurse.

## 2017-08-24 NOTE — Anesthesia Procedure Notes (Signed)
Central Venous Catheter Insertion Performed by: Roderic Palau, MD, anesthesiologist Start/End12/07/2017 7:00 AM, 08/24/2017 7:15 AM Patient location: Pre-op. Preanesthetic checklist: patient identified, IV checked, site marked, risks and benefits discussed, surgical consent, monitors and equipment checked, pre-op evaluation, timeout performed and anesthesia consent Hand hygiene performed  and maximum sterile barriers used  PA cath was placed.Swan type:thermodilution PA Cath depth:50 Procedure performed without using ultrasound guided technique. Attempts: 1 Patient tolerated the procedure well with no immediate complications.

## 2017-08-24 NOTE — H&P (Signed)
[address]       [place], [address]             [phone]                    [place] Medical Record #[id number] Date of Birth: [birth year]  Referring: Nelva Bush Primary Care: Raelene Bott, MD  Chief Complaint:    CAD   History of Present Illness:    Tim [name] 83 y.o. male is seen in the office for  evaluation for possible coronary artery bypass graft .  Since seen last week CT of the chest and pulmonary function studies have been performed .  The patient has discussed with his family the recommendations made last week .  He now is willing to consider proceeding with  consideration of coronary artery bypass grafting.    Patient has known coronary artery disease having had stents placed in the past.  3-4 weeks ago he was admitted to [facility] with acute exacerbation of shortness of breath during the night.  He was treated and released and made arrangements to see Dr. Marlou Porch soon afterwards.  Cardiac catheterization was recommended and performed November 15. The patient notes episodic shortness of breath with chest discomfort sometimes radiating into the left arm.  This is occurred at night and also with exertion.  Patient continues to work in his home shop on a daily basis, but does limit his activities notes that he can no longer climb under the house. Patient has history of multiple visits for bladder outlet obstruction prostatitis and chronic obstructive pulmonary disease with acute exacerbations. Former patient of Dr. Gwenette Greet with COPD and a paralyzed R Hemidiaphragm as seen on fluoroscopy in 2007. Lung function testing in 2005 showed clear airflow obstruction, FEV1 of 1.53 L (43% predicted), total lung capacity 62% predicted, DLCO 73% predicted. He smoked 2-3 packs per day for 40 years, quit around 1980.  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   Small peripheral pulmonary nodule,  thought to be a granuloma was noted in November 2015 on chest x-ray, not noted on film 02/05/2017.  Current PFT's have been done and patient seen by Pulmonary. CT of chest done to ro nodule suggested on chest xray   Current Activity/ Functional Status:  Patient is independent with mobility/ambulation, transfers, ADL's, IADL's.   Zubrod Score: At the time of surgery this patient's most appropriate activity status/level should be described as: []     0    Normal activity, no symptoms []     1    Restricted in physical strenuous activity but ambulatory, able to do out light work [x]     2    Ambulatory and capable of self care, unable to do work activities, up and about               >50 % of waking hours                              []     3    Only limited self care, in bed greater than 50% of waking hours []     4    Completely disabled, no self care, confined to bed or chair []     5    Moribund   Past Medical History:  Diagnosis Date  . AAA (abdominal aortic aneurysm) ([place])  3.2 cm 07/2016; 2-3 year f/u recommended  . Asthma   . BPH (benign prostatic hypertrophy)   . CAD (coronary artery disease)    9 STEMI 2001, PCI, RCA, residual 100% circumflex  /   PCI for in-stent restenosis June, 2001  /  nuclear February, 2010, no ischemia  . Cancer ([place])    skin cancer removed  . Carotid artery disease ([place])    Doppler, June, 2011, 0-39% bilateral, pt denies  . Cervical disc disease    Status post neurosurgery  . Colon polyps   . Ejection fraction    EF 55-65%, echo, 2009  . Elevated CPK    Chronic mild CPK elevation  . Emphysema    Mild  . Fatigue    Morning fatigue, August, 2011  . GERD (gastroesophageal reflux disease)   . Headache    back of head  . Hyperlipidemia   . Hypertension   . Low back pain    February, 2013  . Myocardial infarction ([place])   . Paralyzed hemidiaphragm    Right  . Paralyzed hemidiaphragm    Chronic  . Statin intolerance    Elevated CPK in the past      Past Surgical History:  Procedure Laterality Date  . BREAST SURGERY     right breast removed  . CERVICAL DISC SURGERY    . LEFT HEART CATH AND CORONARY ANGIOGRAPHY N/A 07/29/2017   Procedure: LEFT HEART CATH AND CORONARY ANGIOGRAPHY;  Surgeon: Nelva Bush, MD;  Location: [facility];  Service: Cardiovascular;  Laterality: N/A;  . SKIN CANCER EXCISION      Family History  Problem Relation Age of Onset  . Diabetes Mother   . Other Father   . Diabetes Sister   . Kidney disease Brother   . Cancer Brother   . Other Sister     Social History   Socioeconomic History  . Marital status: Married    Spouse name: Not on file  . Number of children: Not on file  . Years of education: Not on file  . Highest education level: Not on file  Social Needs  . Financial resource strain: Not on file  . Food insecurity - worry: Not on file  . Food insecurity - inability: Not on file  . Transportation needs - medical: Not on file  . Transportation needs - non-medical: Not on file  Occupational History  . Occupation: retired Games developer  Tobacco Use  . Smoking status: Former Smoker    Packs/day: 3.00    Years: 40.00    Pack years: 120.00    Types: Cigarettes    Last attempt to quit: 09/15/1975    Years since quitting: 41.9  . Smokeless tobacco: Never Used  Substance and Sexual Activity  . Alcohol use: No    Frequency: Never  . Drug use: No  . Sexual activity: Not on file  Other Topics Concern  . Not on file  Social History Narrative  . Not on file    Social History   Tobacco Use  Smoking Status Former Smoker  . Packs/day: 3.00  . Years: 40.00  . Pack years: 120.00  . Types: Cigarettes  . Last attempt to quit: 09/15/1975  . Years since quitting: 41.9  Smokeless Tobacco Never Used    Social History   Substance and Sexual Activity  Alcohol Use No  . Frequency: Never     Allergies  Allergen Reactions  . Statins Other (See Comments)    CLASS  EFFECT SEVERE  LEG CRAMPS [MULTIPLE STATINS]    Current Facility-Administered Medications  Medication Dose Route Frequency Provider Last Rate Last Dose  . cefUROXime (ZINACEF) 1.5 g in dextrose 5 % 50 mL IVPB  1.5 g Intravenous To OR Grace Isaac, MD      . cefUROXime (ZINACEF) 750 mg in dextrose 5 % 50 mL IVPB  750 mg Intravenous To OR Grace Isaac, MD      . chlorhexidine (HIBICLENS) 4 % liquid 2 application  30 mL Topical UD Grace Isaac, MD      . dexmedetomidine (PRECEDEX) 400 MCG/100ML (4 mcg/mL) infusion  0.1-0.7 mcg/kg/hr Intravenous To OR Grace Isaac, MD      . DOPamine (INTROPIN) 800 mg in dextrose 5 % 250 mL (3.2 mg/mL) infusion  0-10 mcg/kg/min Intravenous To OR Grace Isaac, MD      . EPINEPHrine (ADRENALIN) 4 mg in dextrose 5 % 250 mL (0.016 mg/mL) infusion  0-10 mcg/min Intravenous To OR Grace Isaac, MD      . heparin 2,500 Units, papaverine 30 mg in electrolyte-148 (PLASMALYTE-148) 500 mL irrigation   Irrigation To OR Grace Isaac, MD      . heparin 30,000 units/NS 1000 mL solution for CELLSAVER   Other To OR Grace Isaac, MD      . insulin regular (NOVOLIN R,HUMULIN R) 100 Units in sodium chloride 0.9 % 100 mL (1 Units/mL) infusion   Intravenous To OR Grace Isaac, MD      . magnesium sulfate (IV Push/IM) injection 40 mEq  40 mEq Other To OR Grace Isaac, MD      . metoprolol tartrate (LOPRESSOR) tablet 12.5 mg  12.5 mg Oral Once Grace Isaac, MD      . milrinone (PRIMACOR) 20 MG/100 ML (0.2 mg/mL) infusion  0.125 mcg/kg/min Intravenous To OR Grace Isaac, MD      . nitroGLYCERIN 50 mg in dextrose 5 % 250 mL (0.2 mg/mL) infusion  2-200 mcg/min Intravenous To OR Grace Isaac, MD      . phenylephrine (NEO-SYNEPHRINE) 20 mg in sodium chloride 0.9 % 250 mL (0.08 mg/mL) infusion  30-200 mcg/min Intravenous To OR Grace Isaac, MD      . potassium chloride injection 80 mEq  80 mEq Other To OR Grace Isaac, MD       . tranexamic acid (CYKLOKAPRON) 2,500 mg in sodium chloride 0.9 % 250 mL (10 mg/mL) infusion  1.5 mg/kg/hr Intravenous To OR Grace Isaac, MD      . tranexamic acid (CYKLOKAPRON) bolus via infusion - over 30 minutes 1,351.5 mg  15 mg/kg Intravenous To OR Grace Isaac, MD      . tranexamic acid (CYKLOKAPRON) pump prime solution 180 mg  2 mg/kg Intracatheter To OR Grace Isaac, MD      . vancomycin (VANCOCIN) 1,500 mg in sodium chloride 0.9 % 250 mL IVPB  1,500 mg Intravenous To OR Grace Isaac, MD       Facility-Administered Medications Ordered in Other Encounters  Medication Dose Route Frequency Provider Last Rate Last Dose  . fentaNYL (SUBLIMAZE) injection    Anesthesia Intra-op Imagene Riches, CRNA   50 mcg at 08/24/17 0645  . lactated ringers infusion    Continuous PRN Imagene Riches, CRNA      . lactated ringers infusion    Continuous PRN Imagene Riches, CRNA      . lactated ringers infusion  Continuous PRN Imagene Riches, CRNA        Pertinent items are noted in HPI.   Review of Systems:     Cardiac Review of Systems: Y or N  Chest Pain [  y  ]  Resting SOB [n   ] Exertional SOB  [ y ]  Vertell Limber Florencio.Farrier  ]   Pedal Edema [ n  ]    Palpitations [ n ] Syncope  [ n ]   Presyncope [ n  ]  General Review of Systems: [Y] = yes [  ]=no Constitional: recent weight change [n ];  Wt loss over the last 3 months [   ] anorexia [  ]; fatigue Blue.Reese  ]; nausea [ n ]; night sweats [n  ]; fever [ n ]; or chills [ n ];          Dental: poor dentition[  ]; Last Dentist visit:   Eye : blurred vision [  ]; diplopia [   ]; vision changes [  ];  Amaurosis fugax[  ]; Resp: cough Blue.Reese  ];  wheezing[y  ];  hemoptysis[ n ]; shortness of breath[y  ]; paroxysmal nocturnal dyspnea[ y ]; dyspnea on exertion[ y ]; or orthopnea[y  ];  GI:  gallstones[  ], vomiting[  ];  dysphagia[  ]; melena[  ];  hematochezia [  ]; heartburn[  ];   Hx of  Colonoscopy[  ]; GU: kidney stones [  ]; hematuria[y  ];    dysuria [  ];  nocturia[  ];  history of     obstruction [  ]; urinary frequency [  ]             Skin: rash, swelling[  ];, hair loss[  ];  peripheral edema[  ];  or itching[  ]; Musculosketetal: myalgias[  ];  joint swelling[  ];  joint erythema[  ];  joint pain[  ];  back pain[  ];  Heme/Lymph: bruising[n  ];  bleeding[  ];  anemia[ n ];  Neuro: TIA[  ];  headaches[  ];  stroke[  ];  vertigo[  ];  seizures[n ];   paresthesias[  ];  difficulty walking[ y ];  Psych:depression[  ]; anxiety[  ];  Endocrine: diabetes[  ];  thyroid dysfunction[  ];  Immunizations: Flu up to date [ y ]; Pneumococcal up to date [ y ];  Other:  Physical Exam: BP (!) 166/83   Pulse 74   Temp (!) 97.5 F (36.4 C) (Oral)   Resp 20   SpO2 97%   PHYSICAL EXAMINATION: General appearance: alert, cooperative, appears stated age and no distress Head: Normocephalic, without obvious abnormality, atraumatic Neck: no adenopathy, no carotid bruit, no JVD, supple, symmetrical, trachea midline and thyroid not enlarged, symmetric, no tenderness/mass/nodules Lymph nodes: Cervical, supraclavicular, and axillary nodes normal. Resp: diminished breath sounds bibasilar Back: symmetric, no curvature. ROM normal. No CVA tenderness. Cardio: regular rate and rhythm, S1, S2 normal, no murmur, click, rub or gallop GI: soft, non-tender; bowel sounds normal; no masses,  no organomegaly Extremities: extremities normal, atraumatic, no cyanosis or edema and no ulcers, gangrene or trophic changes Neurologic: Grossly normal    6 Minute Walk Test Results  Patient: Tim [name] Date:  08/24/2017   Supplemental O2 during test? no      Baseline   End  Time            10:20    10:24 Heartrate  71  88 Dyspnea  S    3 Fatigue  S    3 O2 sat   98%    96% Blood pressure 144/83    185/85   Patient ambulated at a NORMAL SLOW pace for a total distance of 588 feet with no stops.  Ambulation was limited primarily due to SOB and  hip/knee discomfort  Overall the test was tolerated well  PFT's :current  FEV1 2.71 85% DOCO 14.6  43%  Diagnostic Studies & Laboratory data:     Recent Radiology Findings:   Ct Chest Wo Contrast  Result Date: 08/13/2017 CLINICAL DATA:  Possible preop for bypass surgery. Lung nodule. Cough. Shortness of breath on exertion. Remote history of smoking. EXAM: CT CHEST WITHOUT CONTRAST TECHNIQUE: Multidetector CT imaging of the chest was performed following the standard protocol without IV contrast. COMPARISON:  07/24/2017.  02/05/2017.  No prior CT. FINDINGS: Cardiovascular: Tortuous thoracic aorta. Aortic and branch vessel atherosclerosis. Normal heart size, without pericardial effusion. Multivessel coronary artery atherosclerosis. Pulmonary artery enlargement, outflow tract 3.3 cm. Mediastinum/Nodes: No mediastinal or definite hilar adenopathy, given limitations of unenhanced CT. Lungs/Pleura: No pleural fluid.  Moderate centrilobular emphysema. Calcified right upper lobe granuloma 5 mm on image 46/series 5. inferior right upper lobe ground-glass nodule measures 12 mm on image 78/series 5 is felt to be similar to on the prior exam (given thinner slice collimation today). Subpleural bibasilar, lingular and inferior right middle lobe ground-glass opacity and septal thickening. The right lower lobe and right middle lobe opacities were present on the prior, favoring atelectasis. There is also patchy peripheral lingular and superior segment left lower lobe ground-glass opacity. Upper Abdomen: Central left hepatic lobe cyst. Normal imaged portions of the spleen, stomach, adrenal glands, kidneys. Pancreatic parenchymal calcifications. Musculoskeletal: Probable sebaceous cyst about the upper right paraspinous region at 2.1 cm on image 23/series 5. Bilateral glenohumeral joint osteoarthritis. Moderate thoracic spondylosis. IMPRESSION: 1. No dominant or suspicious pulmonary nodule. 2. inferior right upper lobe  ground-glass nodule is felt to be similar back to 11/16/2006, most consistent with a benign etiology. 3. Multifocal areas of ground-glass opacity with scattered foci septal thickening. Some of these were present in 2008 and could represent atelectasis. Others are new and increased. Considerations include mild infection (including atypical etiologies) or pulmonary edema (less likely given lack of pleural fluid). 4. Pulmonary artery enlargement suggests pulmonary arterial hypertension. 5. Coronary artery atherosclerosis. Aortic Atherosclerosis (ICD10-I70.0). 6. Chronic calcific pancreatitis. Electronically Signed   By: Abigail Miyamoto M.D.   On: 08/13/2017 13:48   I have independently reviewed the above radiology studies  and reviewed the findings with the patient.      Recent Lab Findings: Lab Results  Component Value Date   WBC 9.3 08/20/2017   HGB 12.7 (L) 08/20/2017   HCT 38.4 (L) 08/20/2017   PLT 255 08/20/2017   GLUCOSE 95 08/20/2017   ALT 18 08/20/2017   AST 13 (L) 08/20/2017   NA 137 08/20/2017   K 4.3 08/20/2017   CL 108 08/20/2017   CREATININE 1.08 08/20/2017   BUN 21 (H) 08/20/2017   CO2 23 08/20/2017   TSH 1.10 10/31/2012   INR 1.01 08/20/2017   HGBA1C 6.4 (H) 08/20/2017   ECHO: Transthoracic Echocardiography  Patient:    Carrson, Lightcap MR #:       [id number] Study Date: 08/20/2017 Gender:     M Age:        67 Height:     180.3 cm Weight:  90.7 kg BSA:        2.15 m^2 Pt. Status: Room:   ATTENDING    Lanelle Bal MD  ORDERING     Lanelle Bal MD  REFERRING    Lanelle Bal MD  SONOGRAPHER  Johny Chess, RDCS, CCT  PERFORMING   [facility], Outpatient  cc:  ------------------------------------------------------------------- LV EF: 55% -   60%  ------------------------------------------------------------------- Indications:      CAD of native vessels 414.01.  ------------------------------------------------------------------- Study  Conclusions  - Left ventricle: The cavity size was normal. Systolic function was   normal. The estimated ejection fraction was in the range of 55%   to 60%. Probable hypokinesis of the basal-midinferolateral and   inferior myocardium; consistent with ischemia in the distribution   of the right coronary or left circumflex coronary artery. Doppler   parameters are consistent with abnormal left ventricular   relaxation (grade 1 diastolic dysfunction). - Left atrium: The atrium was mildly dilated.  ------------------------------------------------------------------- Study data:  Comparison was made to the study of 10/31/2012.  Study status:  Routine.  Procedure:  The patient reported no pain pre or post test. Transthoracic echocardiography. Image quality was adequate.  Study completion:  There were no complications. Transthoracic echocardiography.  M-mode, complete 2D, spectral Doppler, and color Doppler.  Birthdate:  Patient birthdate: Feb 02, 1929.  Age:  Patient is 83 yr old.  Sex:  Gender: male. BMI: 27.9 kg/m^2.  Blood pressure:     158/65  Patient status: Outpatient.  Study date:  Study date: 08/20/2017. Study time: 08:01 AM.  Location:  Echo laboratory.  -------------------------------------------------------------------  ------------------------------------------------------------------- Left ventricle:  The cavity size was normal. Systolic function was normal. The estimated ejection fraction was in the range of 55% to 60%.  Regional wall motion abnormalities:   Probable hypokinesis of the basal-midinferolateral and inferior myocardium; consistent with ischemia in the distribution of the right coronary or left circumflex coronary artery. Doppler parameters are consistent with abnormal left ventricular relaxation (grade 1 diastolic dysfunction). There was no evidence of elevated ventricular filling pressure by Doppler  parameters.  ------------------------------------------------------------------- Aortic valve:   Trileaflet; normal thickness leaflets. Mobility was not restricted.  Doppler:  Transvalvular velocity was within the normal range. There was no stenosis. There was no regurgitation.   ------------------------------------------------------------------- Aorta:  Aortic root: The aortic root was normal in size.  ------------------------------------------------------------------- Mitral valve:   Structurally normal valve.   Mobility was not restricted.  Doppler:  Transvalvular velocity was within the normal range. There was no evidence for stenosis. There was trivial regurgitation.  ------------------------------------------------------------------- Left atrium:  The atrium was mildly dilated.  ------------------------------------------------------------------- Right ventricle:  The cavity size was normal. Wall thickness was normal. Systolic function was normal.  ------------------------------------------------------------------- Pulmonic valve:    Doppler:  Transvalvular velocity was within the normal range. There was no evidence for stenosis.  ------------------------------------------------------------------- Tricuspid valve:   Structurally normal valve.    Doppler: Transvalvular velocity was within the normal range. There was no regurgitation.  ------------------------------------------------------------------- Pulmonary artery:   The main pulmonary artery was normal-sized. Systolic pressure was within the normal range.  ------------------------------------------------------------------- Right atrium:  The atrium was normal in size.  ------------------------------------------------------------------- Pericardium:  There was no pericardial effusion.  ------------------------------------------------------------------- Systemic veins: Inferior vena cava: The vessel was  normal in size.  ------------------------------------------------------------------- Measurements   Left ventricle                         Value  Reference  LV ID, ED, PLAX chordal        (L)     37    mm     43 - 52  LV ID, ES, PLAX chordal                23.4  mm     23 - 38  LV fx shortening, PLAX chordal         37    %      >=29  LV PW thickness, ED                    12    mm     ----------  IVS/LV PW ratio, ED                    0.98         <=1.3  Stroke volume, 2D                      73    ml     ----------  Stroke volume/bsa, 2D                  34    ml/m^2 ----------  LV e&', lateral                         7.4   cm/s   ----------  LV E/e&', lateral                       8.62         ----------  LV e&', medial                          5.64  cm/s   ----------  LV E/e&', medial                        11.31        ----------  LV e&', average                         6.52  cm/s   ----------  LV E/e&', average                       9.79         ----------    Ventricular septum                     Value        Reference  IVS thickness, ED                      11.8  mm     ----------    LVOT                                   Value        Reference  LVOT ID, S                             20    mm     ----------  LVOT area  3.14  cm^2   ----------  LVOT peak velocity, S                  98.6  cm/s   ----------  LVOT mean velocity, S                  59.5  cm/s   ----------  LVOT VTI, S                            23.2  cm     ----------  LVOT peak gradient, S                  4     mm Hg  ----------    Aorta                                  Value        Reference  Aortic root ID, ED                     33    mm     ----------    Left atrium                            Value        Reference  LA ID, A-P, ES                         44    mm     ----------  LA ID/bsa, A-P                         2.05  cm/m^2 <=2.2  LA volume, S                            65.7  ml     ----------  LA volume/bsa, S                       30.6  ml/m^2 ----------  LA volume, ES, 1-p A4C                 68.5  ml     ----------  LA volume/bsa, ES, 1-p A4C             31.9  ml/m^2 ----------  LA volume, ES, 1-p A2C                 54    ml     ----------  LA volume/bsa, ES, 1-p A2C             25.1  ml/m^2 ----------    Mitral valve                           Value        Reference  Mitral E-wave peak velocity            63.8  cm/s   ----------  Mitral A-wave peak velocity            87.4  cm/s   ----------  Mitral deceleration time       (H)     261  ms     150 - 230  Mitral E/A ratio, peak                 0.7          ----------    Right atrium                           Value        Reference  RA ID, S-I, ES, A4C            (H)     53.6  mm     34 - 49  RA area, ES, A4C                       16.6  cm^2   8.3 - 19.5  RA volume, ES, A/L                     42.5  ml     ----------  RA volume/bsa, ES, A/L                 19.8  ml/m^2 ----------    Systemic veins                         Value        Reference  Estimated CVP                          3     mm Hg  ----------    Right ventricle                        Value        Reference  TAPSE                                  27.7  mm     ----------  RV s&', lateral, S                      15.6  cm/s   ----------  Legend: (L)  and  (H)  mark values outside specified reference range.  ------------------------------------------------------------------- Prepared and Electronically Authenticated by  Sanda Klein, MD 2018-12-07T10:57:41      LEFT HEART CATH AND CORONARY ANGIOGRAPHY  Conclusion   Conclusions: 1. Significant three-vessel coronary artery disease, including sequential 70% and 50% proximal/mid LAD stenoses as well as chronic total occlusion of the proximal/mid LCx and distal RCA. 2. Moderate to severe in-stent restenosis in the mid/distal RCA. 3. Normal left ventricular contraction  and filling pressure.  Recommendations: 1. Cardiac surgery consultation, given significant three vessel coronary artery disease. OM3 and rPDA are supplied by collaterals. 2. If patient is not a surgical candidate, PCI to proximal/mid LAD could be considered, with continued medical management of chronic total occlusions of the LCx and RCA. 3. Aggressive secondary prevention. Consider retrial of statin versus evaluation for PCSK9 inhibitor therapy. 4. Continue atenolol and isosorbide mononitrate, to be uptitrated as tolerated.                Nelva Bush, MD [facility] Pager: [phone]  Coronary Findings   Diagnostic  Dominance: Right  Left Main  Vessel is large.  Mid LM to Ost LAD lesion 30%  stenosed  Mid LM to Ost LAD lesion is 30% stenosed.  Left Anterior Descending  Vessel is large.  Prox LAD lesion 70% stenosed  Prox LAD lesion is 70% stenosed. The lesion is eccentric. The lesion is mildly calcified.  Mid LAD lesion 50% stenosed  Mid LAD lesion is 50% stenosed. The lesion is eccentric.  First Diagonal Branch  Vessel is moderate in size.  Second Diagonal Branch  Vessel is small in size.  Ramus Intermedius  Vessel is moderate in size. Vessel is angiographically normal.  Left Circumflex  Vessel is moderate in size.  Prox Cx to Mid Cx lesion 100% stenosed  Prox Cx to Mid Cx lesion is 100% stenosed. The lesion is chronically occluded with left-to-left collateral flow.  First Obtuse Marginal Branch  Vessel is small in size.  Second Obtuse Marginal Branch  Vessel is small in size.  Third Obtuse Marginal Branch  Vessel is moderate in size.  Collaterals  3rd Mrg filled by collaterals from 1st Diag.    Right Coronary Artery  Prox RCA lesion 50% stenosed  Prox RCA lesion is 50% stenosed.  Mid RCA to Dist RCA lesion 50% stenosed  Mid RCA to Dist RCA lesion is 50% stenosed. The lesion was previously treated.  Dist RCA lesion 100% stenosed  Dist RCA lesion is 100%  stenosed. The lesion is chronically occluded with left-to-right collateral flow.  Acute Marginal Branch  Collaterals  Acute Mrg filled by collaterals from Dist LAD.    Acute Mrg lesion 100% stenosed  Acute Mrg lesion is 100% stenosed. The lesion is chronically occluded with left-to-right collateral flow.  Right Posterior Descending Artery  Collaterals  RPDA filled by collaterals from 2nd Sept.   Hemo Data    Most Recent Value  AO Systolic Pressure 342 mmHg  AO Diastolic Pressure 67 mmHg  AO Mean 876 mmHg  LV Systolic Pressure 811 mmHg  LV Diastolic Pressure 7 mmHg  LV EDP 11 mmHg  Arterial Occlusion Pressure Extended Systolic Pressure 572 mmHg  Arterial Occlusion Pressure Extended Diastolic Pressure 63 mmHg  Arterial Occlusion Pressure Extended Mean Pressure 95 mmHg  Left Ventricular Apex Extended Systolic Pressure 620 mmHg  Left Ventricular Apex Extended Diastolic Pressure 7 mmHg  Left Ventricular Apex Extended EDP Pressure 13 mmHg      Assessment / Plan:    Three-vessel coronary artery disease, with recent admission for unstable angina versus COPD exacerbation-patient referred for consideration of coronary artery bypass grafting because of his in-stent stenosis of the right coronary artery and proximal LAD lesion and total occlusion of the circumflex.  The patient has known underlying COPD, question of a paralyzed diaphragm on the right.  In spite of his age of 18 he remains relatively active around his house.  I discussed coronary artery bypass grafting with the patient his daughter and son, although he does carry a somewhat increased risk due to age it is not prohibitive in his functional status is such that he could achieve a reasonable recovery. CT of the chest shows no worrisome pulmonary nodules Pulmonary function studies does not show prohibitive risk  Known underlying COPD-history of smoking and asbestos exposure in the past Known prostatic hypertrophy with intermittent  bladder outlet obstruction  in the office again reviewed with the patient and his daughter in detail the risks and options of proceeding with coronary artery bypass grafting.  After he is discussed with his family and thought over his options he is willing to proceed with coronary artery bypass grafting.  The goals risks and alternatives of the planned surgical procedure Procedure(s): CORONARY ARTERY BYPASS GRAFTING (CABG),TEE (N/A) TRANSESOPHAGEAL ECHOCARDIOGRAM (TEE) (N/A)  have been discussed with the patient in detail. The risks of the procedure including death, infection, stroke, myocardial infarction, bleeding, blood transfusion have all been discussed specifically.  I have quoted [name] a 40 % of perioperative mortality and a complication rate as high as 4 %. The patient's questions have been answered.[name] is willing  to proceed with the planned procedure.   Grace Isaac MD      [address] [place] [address] Office [phone]   Beeper [phone]  08/24/2017 7:14 AM

## 2017-08-24 NOTE — Anesthesia Procedure Notes (Signed)
Procedure Name: Intubation Date/Time: 08/24/2017 7:50 AM Performed by: Imagene Riches, CRNA Pre-anesthesia Checklist: Patient identified, Emergency Drugs available, Suction available and Patient being monitored Patient Re-evaluated:Patient Re-evaluated prior to induction Oxygen Delivery Method: Circle System Utilized Preoxygenation: Pre-oxygenation with 100% oxygen Induction Type: IV induction Ventilation: Mask ventilation without difficulty and Oral airway inserted - appropriate to patient size Laryngoscope Size: Sabra Heck and 2 Grade View: Grade I Tube type: Oral Tube size: 8.5 mm Number of attempts: 1 Airway Equipment and Method: Stylet and Oral airway Placement Confirmation: ETT inserted through vocal cords under direct vision,  positive ETCO2 and breath sounds checked- equal and bilateral Secured at: 23 cm Tube secured with: Tape Dental Injury: Teeth and Oropharynx as per pre-operative assessment

## 2017-08-24 NOTE — Anesthesia Procedure Notes (Signed)
Arterial Line Insertion Start/End12/07/2017 6:50 AM, 08/24/2017 6:54 AM Performed by: Imagene Riches, CRNA, CRNA  Patient location: Pre-op. Preanesthetic checklist: patient identified, IV checked, site marked, risks and benefits discussed, surgical consent, monitors and equipment checked, pre-op evaluation and anesthesia consent Lidocaine 1% used for infiltration and patient sedated Right, radial was placed Catheter size: 20 G Hand hygiene performed  and maximum sterile barriers used   Attempts: 1 Procedure performed without using ultrasound guided technique. Following insertion, dressing applied and Biopatch. Post procedure assessment: normal  Patient tolerated the procedure well with no immediate complications.

## 2017-08-24 NOTE — Anesthesia Preprocedure Evaluation (Addendum)
Anesthesia Evaluation  Patient identified by MRN, date of birth, ID band Patient awake    Reviewed: Allergy & Precautions, NPO status , Patient's Chart, lab work & pertinent test results  Airway Mallampati: I  TM Distance: >3 FB Neck ROM: Full    Dental   Pulmonary COPD, former smoker,    Pulmonary exam normal        Cardiovascular hypertension, Pt. on medications + CAD, + Past MI and + DOE  Normal cardiovascular exam     Neuro/Psych    GI/Hepatic GERD  Medicated and Controlled,  Endo/Other    Renal/GU      Musculoskeletal   Abdominal   Peds  Hematology   Anesthesia Other Findings   Reproductive/Obstetrics                             Anesthesia Physical Anesthesia Plan  ASA: III  Anesthesia Plan: General   Post-op Pain Management:    Induction: Intravenous  PONV Risk Score and Plan: 2 and Treatment may vary due to age or medical condition  Airway Management Planned: Oral ETT  Additional Equipment: Arterial line, CVP, PA Cath, TEE, 3D TEE and Ultrasound Guidance Line Placement  Intra-op Plan:   Post-operative Plan: Post-operative intubation/ventilation  Informed Consent: I have reviewed the patients History and Physical, chart, labs and discussed the procedure including the risks, benefits and alternatives for the proposed anesthesia with the patient or authorized representative who has indicated his/her understanding and acceptance.     Plan Discussed with: CRNA and Surgeon  Anesthesia Plan Comments:         Anesthesia Quick Evaluation

## 2017-08-24 NOTE — Progress Notes (Signed)
[facility] BRIEF SICU PROGRESS NOTE  Day of Surgery  S/P Procedure(s) (LRB): CORONARY ARTERY BYPASS GRAFTING times four using left internal mammary artery and bilateral saphenous vein, using endoscope. TEE (N/A) TRANSESOPHAGEAL ECHOCARDIOGRAM (TEE) (N/A)   Extubated uneventfully AAI paced w/ stable hemodynamics Breathing comfortably w/ O2 sats 93-98% on 2 L/min Chest tube output low UOP adequate Labs okay  Plan: Continue routine early postop  Rexene Alberts, MD 08/24/2017 8:36 PM

## 2017-08-24 NOTE — Procedures (Signed)
Extubation Procedure Note  Patient Details:   Name: Tim [name] DOB: 07-21-1929 MRN: [id number]   Airway Documentation:     Evaluation  O2 sats: stable throughout Complications: No apparent complications Patient did tolerate procedure well. Bilateral Breath Sounds: Clear, Diminished   Yes   Pt extubated to 2L n/c.  NIF > -20, V/C of 864 mL.  No stridor noted.  RN @ bedside.  Donnetta Hail 08/24/2017, 6:12 PM

## 2017-08-24 NOTE — Transfer of Care (Signed)
Immediate Anesthesia Transfer of Care Note  Patient: PANCHO RUSHING  Procedure(s) Performed: CORONARY ARTERY BYPASS GRAFTING times four using left internal mammary artery and bilateral saphenous vein, using endoscope. TEE (N/A Chest) TRANSESOPHAGEAL ECHOCARDIOGRAM (TEE) (N/A )  Patient Location: SICU  Anesthesia Type:General  Level of Consciousness: sedated  Airway & Oxygen Therapy: Patient remains intubated per anesthesia plan  Post-op Assessment: Report given to RN and Post -op Vital signs reviewed and stable  Post vital signs: Reviewed and stable  Last Vitals:  Vitals:   08/24/17 0546  BP: (!) 166/83  Pulse: 74  Resp: 20  Temp: (!) 36.4 C  SpO2: 97%    Last Pain:  Vitals:   08/24/17 0546  TempSrc: Oral         Complications: No apparent anesthesia complications

## 2017-08-25 ENCOUNTER — Ambulatory Visit: Payer: Medicare Other | Admitting: Cardiothoracic Surgery

## 2017-08-25 ENCOUNTER — Inpatient Hospital Stay (HOSPITAL_COMMUNITY): Payer: Medicare Other

## 2017-08-25 ENCOUNTER — Encounter (HOSPITAL_COMMUNITY): Payer: Self-pay | Admitting: Cardiothoracic Surgery

## 2017-08-25 LAB — GLUCOSE, CAPILLARY
Glucose-Capillary: 104 mg/dL — ABNORMAL HIGH (ref 65–99)
Glucose-Capillary: 108 mg/dL — ABNORMAL HIGH (ref 65–99)
Glucose-Capillary: 112 mg/dL — ABNORMAL HIGH (ref 65–99)
Glucose-Capillary: 114 mg/dL — ABNORMAL HIGH (ref 65–99)
Glucose-Capillary: 115 mg/dL — ABNORMAL HIGH (ref 65–99)
Glucose-Capillary: 115 mg/dL — ABNORMAL HIGH (ref 65–99)
Glucose-Capillary: 115 mg/dL — ABNORMAL HIGH (ref 65–99)
Glucose-Capillary: 118 mg/dL — ABNORMAL HIGH (ref 65–99)
Glucose-Capillary: 122 mg/dL — ABNORMAL HIGH (ref 65–99)
Glucose-Capillary: 130 mg/dL — ABNORMAL HIGH (ref 65–99)
Glucose-Capillary: 131 mg/dL — ABNORMAL HIGH (ref 65–99)
Glucose-Capillary: 138 mg/dL — ABNORMAL HIGH (ref 65–99)
Glucose-Capillary: 139 mg/dL — ABNORMAL HIGH (ref 65–99)
Glucose-Capillary: 147 mg/dL — ABNORMAL HIGH (ref 65–99)
Glucose-Capillary: 94 mg/dL (ref 65–99)

## 2017-08-25 LAB — POCT I-STAT, CHEM 8
BUN: 16 mg/dL (ref 6–20)
Calcium, Ion: 1.13 mmol/L — ABNORMAL LOW (ref 1.15–1.40)
Chloride: 107 mmol/L (ref 101–111)
Creatinine, Ser: 0.8 mg/dL (ref 0.61–1.24)
Glucose, Bld: 126 mg/dL — ABNORMAL HIGH (ref 65–99)
HCT: 31 % — ABNORMAL LOW (ref 39.0–52.0)
Hemoglobin: 10.5 g/dL — ABNORMAL LOW (ref 13.0–17.0)
Potassium: 4.4 mmol/L (ref 3.5–5.1)
Sodium: 141 mmol/L (ref 135–145)
TCO2: 22 mmol/L (ref 22–32)

## 2017-08-25 LAB — POCT I-STAT 4, (NA,K, GLUC, HGB,HCT)
Glucose, Bld: 149 mg/dL — ABNORMAL HIGH (ref 65–99)
HCT: 30 % — ABNORMAL LOW (ref 39.0–52.0)
Hemoglobin: 10.2 g/dL — ABNORMAL LOW (ref 13.0–17.0)
Potassium: 4.6 mmol/L (ref 3.5–5.1)
Sodium: 140 mmol/L (ref 135–145)

## 2017-08-25 LAB — CBC
HCT: 33.2 % — ABNORMAL LOW (ref 39.0–52.0)
Hemoglobin: 10.9 g/dL — ABNORMAL LOW (ref 13.0–17.0)
MCH: 29.2 pg (ref 26.0–34.0)
MCHC: 32.8 g/dL (ref 30.0–36.0)
MCV: 89 fL (ref 78.0–100.0)
Platelets: 195 10*3/uL (ref 150–400)
RBC: 3.73 MIL/uL — ABNORMAL LOW (ref 4.22–5.81)
RDW: 14.2 % (ref 11.5–15.5)
WBC: 21.8 10*3/uL — ABNORMAL HIGH (ref 4.0–10.5)

## 2017-08-25 LAB — BASIC METABOLIC PANEL
Anion gap: 7 (ref 5–15)
BUN: 16 mg/dL (ref 6–20)
CO2: 20 mmol/L — ABNORMAL LOW (ref 22–32)
Calcium: 7.8 mg/dL — ABNORMAL LOW (ref 8.9–10.3)
Chloride: 109 mmol/L (ref 101–111)
Creatinine, Ser: 1.09 mg/dL (ref 0.61–1.24)
GFR calc Af Amer: >60 mL/min (ref >60)
GFR calc non Af Amer: 59 mL/min — ABNORMAL LOW (ref >60)
Glucose, Bld: 119 mg/dL — ABNORMAL HIGH (ref 65–99)
Potassium: 4.3 mmol/L (ref 3.5–5.1)
Sodium: 136 mmol/L (ref 135–145)

## 2017-08-25 LAB — TROPONIN I: Troponin I: 0.69 ng/mL (ref <0.03)

## 2017-08-25 LAB — MAGNESIUM
Magnesium: 2.6 mg/dL — ABNORMAL HIGH (ref 1.7–2.4)
Magnesium: 2.5 mg/dL — ABNORMAL HIGH (ref 1.7–2.4)

## 2017-08-25 LAB — CREATININE, SERUM
Creatinine, Ser: 1.65 mg/dL — ABNORMAL HIGH (ref 0.61–1.24)
GFR calc Af Amer: 42 mL/min — ABNORMAL LOW (ref >60)
GFR calc non Af Amer: 36 mL/min — ABNORMAL LOW (ref >60)

## 2017-08-25 MED ORDER — LEVALBUTEROL HCL 0.63 MG/3ML IN NEBU
0.6300 mg | INHALATION_SOLUTION | Freq: Four times a day (QID) | RESPIRATORY_TRACT | Status: DC | PRN
  Administered 2017-08-26 – 2017-08-28 (×2): 0.63 mg via RESPIRATORY_TRACT
  Filled 2017-08-25 (×3): qty 3

## 2017-08-25 MED ORDER — METOPROLOL TARTRATE 25 MG/10 ML ORAL SUSPENSION: 12.5000 mg | Freq: Two times a day (BID) | ORAL | Status: DC

## 2017-08-25 MED ORDER — ENOXAPARIN SODIUM 30 MG/0.3ML ~~LOC~~ SOLN: 30.0000 mg | SUBCUTANEOUS | Status: DC

## 2017-08-25 MED ORDER — LEVALBUTEROL HCL 0.63 MG/3ML IN NEBU: 0.6300 mg | INHALATION_SOLUTION | Freq: Four times a day (QID) | RESPIRATORY_TRACT | Status: DC

## 2017-08-25 MED ORDER — FUROSEMIDE 10 MG/ML IJ SOLN
40.0000 mg | Freq: Once | INTRAMUSCULAR | Status: AC
  Administered 2017-08-25: 40 mg via INTRAVENOUS
  Filled 2017-08-25: qty 4

## 2017-08-25 MED ORDER — INSULIN ASPART 100 UNIT/ML ~~LOC~~ SOLN
0.0000 [IU] | SUBCUTANEOUS | Status: DC
  Administered 2017-08-25 – 2017-08-28 (×7): 2 [IU] via SUBCUTANEOUS
  Administered 2017-08-28: 4 [IU] via SUBCUTANEOUS
  Administered 2017-08-28: 2 [IU] via SUBCUTANEOUS
  Administered 2017-08-28: 4 [IU] via SUBCUTANEOUS
  Administered 2017-08-29 (×5): 2 [IU] via SUBCUTANEOUS

## 2017-08-25 MED ORDER — MOVING RIGHT ALONG BOOK
Freq: Once | Status: AC
  Administered 2017-08-25: 1
  Filled 2017-08-25: qty 1

## 2017-08-25 MED ORDER — ENOXAPARIN SODIUM 30 MG/0.3ML ~~LOC~~ SOLN
30.0000 mg | SUBCUTANEOUS | Status: DC
  Administered 2017-08-25 – 2017-09-02 (×9): 30 mg via SUBCUTANEOUS
  Filled 2017-08-25 (×9): qty 0.3

## 2017-08-25 MED ORDER — METOPROLOL TARTRATE 12.5 MG HALF TABLET
12.5000 mg | ORAL_TABLET | Freq: Two times a day (BID) | ORAL | Status: DC
  Administered 2017-08-26 – 2017-08-28 (×5): 12.5 mg via ORAL
  Filled 2017-08-25 (×5): qty 1

## 2017-08-25 MED ORDER — FINASTERIDE 5 MG PO TABS
5.0000 mg | ORAL_TABLET | Freq: Every day | ORAL | Status: DC
  Administered 2017-08-25 – 2017-09-03 (×10): 5 mg via ORAL
  Filled 2017-08-25 (×10): qty 1

## 2017-08-25 NOTE — Evaluation (Signed)
Clinical/Bedside Swallow Evaluation Patient Details  Name: Tim [name] MRN: [id number] Date of Birth: 29-Jun-1929  Today's Date: 08/25/2017 Time: SLP Start Time (ACUTE ONLY): 1439 SLP Stop Time (ACUTE ONLY): 1500 SLP Time Calculation (min) (ACUTE ONLY): 21 min  Past Medical History:  Past Medical History:  Diagnosis Date  . AAA (abdominal aortic aneurysm) (HCC)    3.2 cm 07/2016; 2-3 year f/u recommended  . Asthma   . BPH (benign prostatic hypertrophy)   . CAD (coronary artery disease)    9 STEMI 2001, PCI, RCA, residual 100% circumflex  /   PCI for in-stent restenosis June, 2001  /  nuclear February, 2010, no ischemia  . Cancer ([facility])    skin cancer removed  . Carotid artery disease ([facility])    Doppler, June, 2011, 0-39% bilateral, pt denies  . Cervical disc disease    Status post neurosurgery  . Colon polyps   . Ejection fraction    EF 55-65%, echo, 2009  . Elevated CPK    Chronic mild CPK elevation  . Emphysema    Mild  . Fatigue    Morning fatigue, August, 2011  . GERD (gastroesophageal reflux disease)   . Headache    back of head  . Hyperlipidemia   . Hypertension   . Low back pain    February, 2013  . Myocardial infarction ([facility])   . Paralyzed hemidiaphragm    Right  . Paralyzed hemidiaphragm    Chronic  . Statin intolerance    Elevated CPK in the past   Past Surgical History:  Past Surgical History:  Procedure Laterality Date  . BREAST SURGERY     right breast removed  . CERVICAL DISC SURGERY    . LEFT HEART CATH AND CORONARY ANGIOGRAPHY N/A 07/29/2017   Procedure: LEFT HEART CATH AND CORONARY ANGIOGRAPHY;  Surgeon: Nelva Bush, MD;  Location: [facility];  Service: Cardiovascular;  Laterality: N/A;  . SKIN CANCER EXCISION     HPI:  Tim [name] y.o.malewho underwent CABG x 4 08/24/17. 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 Small peripheral pulmonary  nodule, thought to be a granuloma was noted in November 2015 on chest x-ray, not noted on film 02/05/2017. CXR Stable low lung volumes, bilateral pleural effusions, and bibasilar airspace disease, likely atelectasis. ST ordered after pt stated he had difficulty swallowing applesauce.    Assessment / Plan / Recommendation Clinical Impression  Pt reports frequent pharyngeal globus sensation since surgery. Skilled recommendations and manuevers such as chin tuck, effortful second swallow and sips liquid attempted. Perceptively pt reported increased pharyngeal sensation with chin tuck but 2nd swallow followed with liquid effective. Pt may have had possible premorbid altered swallow not recognizable prior to surgery and exacerbated with deconditioning, short term intubation (hours), COPD and history of GERD. Recommended to continue full liquids as this should result in less pharyngeal residue. Will continue to treat and make appropriate/safest recommendations re: po's.  SLP Visit Diagnosis: Dysphagia, unspecified (R13.10)    Aspiration Risk  Mild aspiration risk;Moderate aspiration risk    Diet Recommendation Thin liquid(full liquids)   Liquid Administration via: Straw;Cup Medication Administration: Crushed with puree Supervision: Patient able to self feed;Intermittent supervision to cue for compensatory strategies Compensations: Slow rate;Small sips/bites;Multiple dry swallows after each bite/sip;Follow solids with liquid Postural Changes: Seated upright at 90 degrees;Remain upright for at least 30 minutes after po intake    Other  Recommendations Oral  Care Recommendations: Oral care BID   Follow up Recommendations None      Frequency and Duration min 2x/week  2 weeks       Prognosis Prognosis for Safe Diet Advancement: Good      Swallow Study   General HPI: Tim [name] y.o.malewho underwent CABG x 4 08/24/17. 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 Small peripheral pulmonary nodule, thought to be a granuloma was noted in November 2015 on chest x-ray, not noted on film 02/05/2017. CXR Stable low lung volumes, bilateral pleural effusions, and bibasilar airspace disease, likely atelectasis. ST ordered after pt stated he had difficulty swallowing applesauce.  Type of Study: Bedside Swallow Evaluation Previous Swallow Assessment: (none) Diet Prior to this Study: Thin liquids(full liquids) Temperature Spikes Noted: No Respiratory Status: Nasal cannula History of Recent Intubation: Yes Length of Intubations (days): (during surgery) Date extubated: 08/24/17 Behavior/Cognition: Alert;Cooperative;Pleasant mood Oral Cavity Assessment: Within Functional Limits Oral Care Completed by SLP: No Oral Cavity - Dentition: Dentures, top;Dentures, bottom Vision: Functional for self-feeding Self-Feeding Abilities: Able to feed self Patient Positioning: Upright in chair Baseline Vocal Quality: Normal Volitional Cough: Strong Volitional Swallow: Able to elicit    Oral/Motor/Sensory Function Overall Oral Motor/Sensory Function: Within functional limits   Ice Chips Ice chips: Not tested   Thin Liquid Thin Liquid: Within functional limits Presentation: Cup;Straw    Nectar Thick Nectar Thick Liquid: Not tested   Honey Thick Honey Thick Liquid: Not tested   Puree Puree: Impaired Pharyngeal Phase Impairments: Other (comments)(reports of globus sensation)   Solid   GO   Solid: Impaired Pharyngeal Phase Impairments: (reports of pharyngeal globus)        Houston Siren 08/25/2017,3:21 PM  Orbie Pyo [place] [phone]

## 2017-08-25 NOTE — Op Note (Signed)
Tim [name], Tim [name] NO.:  [id number]  MEDICAL RECORD NO.:  [id number]  LOCATION:  [id number]                        FACILITY:  [facility]  PHYSICIAN:  Lanelle Bal, MD    DATE OF BIRTH:  October 19, 1928  DATE OF PROCEDURE:  08/24/2017 DATE OF DISCHARGE:                              OPERATIVE REPORT   PREOPERATIVE DIAGNOSIS:  Coronary occlusive disease with angina.  POSTOPERATIVE DIAGNOSIS:  Coronary occlusive disease with angina.  SURGICAL PROCEDURES:  Coronary artery bypass grafting x4 with the left internal mammary to the left anterior descending coronary artery, reversed saphenous vein graft to the diagonal coronary artery, reversed saphenous vein graft to the distal circumflex coronary artery, reversed saphenous vein graft to the posterior descending coronary artery with bilateral right and left greater saphenous endoscopic vein harvesting.  SURGEON:  Lanelle Bal, MD.  FIRST ASSISTANT:  Lars Pinks, [place].  BRIEF HISTORY:  The patient is an 83 year old male with known underlying pulmonary disease, who presents in November with question of COPD exacerbation, was treated transiently for this, but continued to have chest discomfort and ultimately had cardiac evaluation including cardiac catheterization by Dr. Saunders Revel.  At the time of cardiac catheterization, the patient was found to have a totally occluded circumflex, totally occluded right coronary artery, and complex high-grade stenosis of the proximal LAD and diagonal.  Overall, ventricular function was preserved. The patient was evaluated with his pulmonary disease and counseled about proceeding with coronary artery bypass grafting and the risk involved, especially with his age, pulmonary disease.  However, considering the options, the patient has remained very active and was willing to proceed with coronary artery bypass grafting.  He agreed and signed informed consent.  DESCRIPTION OF PROCEDURE:  With  Swan-Ganz and arterial line monitors placed, the patient underwent general endotracheal anesthesia without incident.  The skin of the chest and legs was prepped with Betadine and draped in the usual sterile manner.  Appropriate time-out was performed. We then proceeded with endoscopic vein harvesting of the right greater saphenous from the right thigh endoscopically and a similar portion from the left thigh.  Median sternotomy was performed.  The left internal mammary artery was dissected down as a pedicle graft.  The distal artery was divided and had good free flow.  Pericardium was opened.  Overall ventricular function appeared preserved.  The patient was systemically heparinized.  Ascending aorta was cannulated.  Right atrium was cannulated.  An aortic root vent cardioplegia needle was introduced into the ascending aorta.  The patient was placed on cardiopulmonary bypass at 2.4 L/min/m2.  Sites of anastomosis were selected and dissected out of the epicardium.  The patient's distal right coronary artery was significantly calcified.  The proximal posterior descending coronary artery was identified.  It was relatively small, somewhat thickened, but was able to be bypassed.  Aortic cross clamp was applied and 500 mL of cold blood potassium cardioplegia was administered antegrade with diastolic arrest of the heart.  Myocardial septal temperature was monitored throughout the cross-clamp.  Attention was turned first to the posterior descending coronary artery, which was opened and admitted a 1- mm probe distally.  The vessel was 1.2-1.3 mm in size.  Using a running  7-0 Prolene, distal anastomosis was performed with a segment of reversed saphenous vein graft.  The heart was then elevated and the distal circumflex branch which was totally occluded was identified, opened.  It was 1.3 mm in size.  Using a running 7-0 Prolene, distal anastomosis was performed with a segment of reversed saphenous  vein graft.  Additional cold blood cardioplegia was administered down the vein grafts intermittently.  We then turned our attention to the diagonal coronary artery, which was a moderate-sized vessel, actually larger than the distal circumflex.  Using a running 7-0 Prolene, distal anastomosis was performed between the mid and distal third of the LAD.  Vessel was opened and was admitted a 1.5-mm probe proximally and distally.  Using a running 8-0 Prolene, the left internal mammary artery was anastomosed to the left anterior descending coronary artery.  With release of the full dog on mammary artery, there was appropriate rise in myocardial septal temperature.  The bulldog was placed back on the mammary artery. Additional cold blood cardioplegia was administered.  With the crossclamp still in place, 3 punch aortotomies were performed.  Each of the 3 vein grafts was anastomosed to the ascending aorta.  The bulldog was then removed on the mammary artery with prompt rise in myocardial septal temperature.  The heart was allowed to passively fill and de-air and the proximal anastomosis completed.  The crossclamp was removed with a total cross-clamp time of 91 minutes.  The patient required electrical defibrillation and turned to a sinus rhythm.  Sites of anastomosis were inspected and free of bleeding.  The patient was then ventilated and was rewarmed and was then ventilated and weaned from cardiopulmonary bypass without difficulty.  He remained hemodynamically stable.  He was decannulated in usual fashion.  Protamine sulfate was administered. With operative field hemostatic, atrial and ventricular pacing wires were applied.  Graft markers were applied.  A left pleural tube and Blake mediastinal drains were left in place.  Pericardium was loosely reapproximated.  Sternum was closed with #6 stainless steel wire. Fascia was closed with interrupted 3-0 Vicryl, running 3-0 Vicryl in subcutaneous  tissue, 3-0 subcuticular stitch in skin edges.  Dry dressings were applied.  Sponge and needle count was reported as correct at the completion of procedure.  Total pump time was 130 minutes.  The patient was transferred to the Surgical Intensive Care Unit for further postoperative care.  The patient did not require any blood bank blood products during the operative procedure.     Lanelle Bal, MD     EG/MEDQ  D:  08/25/2017  T:  08/25/2017  Job:  [id number]

## 2017-08-25 NOTE — Progress Notes (Signed)
Patient ID: Tim [name], male   DOB: [birth year], 83 y.o.   MRN: [id number] [facility] Evening Rounds  Hemodynamically stable off pressors  Has not diuresed yet because he was on pressors this am. Will give him a dose of lasix tonight  CT output low.  BMET    Component Value Date/Time   NA 140 08/25/2017 1712   NA 142 07/27/2017 1020   K 4.6 08/25/2017 1712   CL 109 08/25/2017 0208   CO2 20 (L) 08/25/2017 0208   GLUCOSE 149 (H) 08/25/2017 1712   BUN 16 08/25/2017 0208   BUN 22 07/27/2017 1020   CREATININE 1.09 08/25/2017 0208   CALCIUM 7.8 (L) 08/25/2017 0208   GFRNONAA 59 (L) 08/25/2017 0208   GFRAA >60 08/25/2017 0208   CBC    Component Value Date/Time   WBC 21.8 (H) 08/25/2017 0208   RBC 3.73 (L) 08/25/2017 0208   HGB 10.2 (L) 08/25/2017 1712   HGB 13.0 07/27/2017 1020   HCT 30.0 (L) 08/25/2017 1712   HCT 38.3 07/27/2017 1020   PLT 195 08/25/2017 0208   PLT 269 07/27/2017 1020   MCV 89.0 08/25/2017 0208   MCV 87 07/27/2017 1020   MCH 29.2 08/25/2017 0208   MCHC 32.8 08/25/2017 0208   RDW 14.2 08/25/2017 0208   RDW 14.7 07/27/2017 1020   LYMPHSABS 2.6 08/16/2017 1024   MONOABS 1.3 (H) 08/16/2017 1024   EOSABS 0.3 08/16/2017 1024   BASOSABS 0.0 08/16/2017 1024

## 2017-08-25 NOTE — Progress Notes (Addendum)
TCTS DAILY ICU PROGRESS NOTE                   [address]            [place] [address]          [phone]   1 Day Post-Op Procedure(s) (LRB): CORONARY ARTERY BYPASS GRAFTING times four using left internal mammary artery and bilateral saphenous vein, using endoscope. TEE (N/A) TRANSESOPHAGEAL ECHOCARDIOGRAM (TEE) (N/A)  Total Length of Stay:  LOS: 1 day   Subjective: Patient with complaints of difficulty swallowing-unable to eat applesauce. He also has incisional, sternal pain.  Objective: Vital signs in last 24 hours: Temp:  [96.4 F (35.8 C)-100.2 F (37.9 C)] 99 F (37.2 C) (12/12 1000) Pulse Rate:  [65-103] 90 (12/12 1000) Cardiac Rhythm: (P) Atrial paced (12/12 0800) Resp:  [12-36] 25 (12/12 1000) BP: (89-132)/(55-100) 99/55 (12/12 1000) SpO2:  [74 %-100 %] 97 % (12/12 1000) Arterial Line BP: (73-141)/(33-70) 103/53 (12/12 1000) FiO2 (%):  [40 %-50 %] 40 % (12/11 1800) Weight:  [198 lb 11.2 oz (90.1 kg)-205 lb 0.4 oz (93 kg)] 205 lb 0.4 oz (93 kg) (12/12 0500)  Filed Weights   08/24/17 1415 08/25/17 0500  Weight: 198 lb 11.2 oz (90.1 kg) 205 lb 0.4 oz (93 kg)    Weight change:    Hemodynamic parameters for last 24 hours: PAP: (29-68)/(7-41) 50/31 CO:  [4.2 L/min-6.4 L/min] 4.4 L/min CI:  [2 L/min/m2-3 L/min/m2] 2.1 L/min/m2  Intake/Output from previous day: 12/11 0701 - 12/12 0700 In: 5438.3 [I.V.:4040.3; Blood:138; NG/GT:60; IV Piggyback:1200] Out: 2980 [Urine:1645; Emesis/NG output:75; Blood:900; Chest Tube:360]  Intake/Output this shift: No intake/output data recorded.  Current Meds: Scheduled Meds: . acetaminophen  1,000 mg Oral Q6H   Or  . acetaminophen (TYLENOL) oral liquid 160 mg/5 mL  1,000 mg Per Tube Q6H  . aspirin EC  325 mg Oral Daily   Or  . aspirin  324 mg Per Tube Daily  . azelastine  2 spray Each Nare BID  . bisacodyl  10 mg Oral Daily   Or  . bisacodyl  10 mg Rectal Daily  . Chlorhexidine Gluconate Cloth  6  each Topical Daily  . docusate sodium  200 mg Oral Daily  . [START ON 08/26/2017] finasteride  5 mg Oral BID  . fluticasone furoate-vilanterol  1 puff Inhalation Daily  . insulin regular  0-10 Units Intravenous TID WC  . mouth rinse  15 mL Mouth Rinse BID  . metoprolol tartrate  12.5 mg Oral BID   Or  . metoprolol tartrate  12.5 mg Per Tube BID  . [START ON 08/26/2017] pantoprazole  40 mg Oral Daily  . sodium chloride flush  10-40 mL Intracatheter Q12H  . sodium chloride flush  3 mL Intravenous Q12H  . tamsulosin  0.4 mg Oral BID   Continuous Infusions: . sodium chloride 10 mL/hr at 08/24/17 2100  . sodium chloride    . sodium chloride Stopped (08/24/17 1500)  . albumin human 250 mL (08/25/17 0326)  . cefUROXime (ZINACEF)  IV Stopped (08/25/17 0944)  . dexmedetomidine (PRECEDEX) IV infusion Stopped (08/24/17 1849)  . insulin (NOVOLIN-R) infusion 1.8 Units/hr (08/25/17 0656)  . lactated ringers 20 mL/hr at 08/24/17 2100  . lactated ringers 20 mL/hr at 08/25/17 0453  . nitroGLYCERIN Stopped (08/24/17 1823)  . phenylephrine (NEO-SYNEPHRINE) Adult infusion 40 mcg/min (08/25/17 0453)   PRN Meds:.sodium chloride, albumin human, levalbuterol, metoprolol tartrate, midazolam, morphine injection, ondansetron (ZOFRAN) IV,  oxyCODONE, sodium chloride flush, sodium chloride flush, traMADol  General appearance: alert, cooperative and no distress Neurologic: intact Heart: Paced Lungs: Decreased at bases bilaterally Abdomen: Soft, non tender, bowel sounds present Extremities: Trace LE edema Wound: Aquacel intact on sternal wound. Bilateral LE dressings are clean and dry.  Lab Results: CBC: Recent Labs    08/24/17 1956 08/25/17 0208  WBC 13.4* 21.8*  HGB 10.6* 10.9*  HCT 32.2* 33.2*  PLT 178 195   BMET:  Recent Labs    08/24/17 1952 08/24/17 1956 08/25/17 0208  NA 141  --  136  K 4.4  --  4.3  CL 107  --  109  CO2  --   --  20*  GLUCOSE 126*  --  119*  BUN 16  --  16    CREATININE 0.80 0.89 1.09  CALCIUM  --   --  7.8*    CMET: Lab Results  Component Value Date   WBC 21.8 (H) 08/25/2017   HGB 10.9 (L) 08/25/2017   HCT 33.2 (L) 08/25/2017   PLT 195 08/25/2017   GLUCOSE 119 (H) 08/25/2017   ALT 18 08/20/2017   AST 13 (L) 08/20/2017   NA 136 08/25/2017   K 4.3 08/25/2017   CL 109 08/25/2017   CREATININE 1.09 08/25/2017   BUN 16 08/25/2017   CO2 20 (L) 08/25/2017   TSH 1.10 10/31/2012   INR 1.39 08/24/2017   HGBA1C 6.4 (H) 08/20/2017      PT/INR:  Recent Labs    08/24/17 1401  LABPROT 17.0*  INR 1.39   Radiology: Dg Chest Port 1 View  Result Date: 08/25/2017 CLINICAL DATA:  CABG yesterday. EXAM: PORTABLE CHEST 1 VIEW COMPARISON:  One-view chest x-ray 08/24/2017 FINDINGS: The patient has been extubated. NG tube was removed. Into the Swan-Ganz catheter is in the main pulmonary outflow tract. Left-sided chest tube is in place. There is no significant pneumothorax. Atherosclerotic calcifications are present at the aortic arch. Lung volumes remain low. Bilateral pleural effusions are present. Bibasilar airspace disease likely reflects atelectasis without significant change. Mild pulmonary vascular congestion is present. IMPRESSION: 1. Interval extubation and removal of NG tube. 2. The support apparatus is otherwise stable. 3. Stable low lung volumes, bilateral pleural effusions, and bibasilar airspace disease, likely atelectasis. 4.  Aortic Atherosclerosis (ICD10-I70.0). Electronically Signed   By: San Morelle M.D.   On: 08/25/2017 07:56   Dg Chest Port 1 View  Result Date: 08/24/2017 CLINICAL DATA:  Status post coronary bypass grafting EXAM: PORTABLE CHEST 1 VIEW COMPARISON:  08/20/2017 FINDINGS: Endotracheal tube, nasogastric catheter and Swan-Ganz catheter are noted in position. Left-sided thoracostomy catheter and mediastinal drain are also noted. No pneumothorax is seen. The overall inspiratory effort is poor with bibasilar  atelectatic changes. Changes of recent coronary bypass grafting are noted. No acute bony abnormality is seen. IMPRESSION: Bibasilar atelectatic changes. Tubes and lines as described above. No pneumothorax is noted. Electronically Signed   By: Inez Catalina M.D.   On: 08/24/2017 14:33     Assessment/Plan: S/P Procedure(s) (LRB): CORONARY ARTERY BYPASS GRAFTING times four using left internal mammary artery and bilateral saphenous vein, using endoscope. TEE (N/A) TRANSESOPHAGEAL ECHOCARDIOGRAM (TEE) (N/A)  1. CV-AAI paced. On Neo Synephrine drip and Lopressor 12.5 mg bid (will follow parameters). 2. Pulmonary-Has COPD, elevated right hemidaphragm. On 3 liters of oxygen via [place]. Will work on weaning over next several days. Chest tubes with 360 cc of output since surgery. CXR this am shows low lung volumes,  bilateral pleural effusions and ateletctasis. Continue Xopenex and inhaler.Encourage incentive spirometer and flutter valve. 3. Volume overload-Will diurese once off Neo Synephrine. 4. ABL anemia-H and H 10.9 and 33.2 5.  CBGs 114/104/94. On Insulin drip. Pre op HGA1C 6.4. He likely has pre diabetes. Will not schedule Insulin but will give PRN to avoid hypoglycemia as weaned off drip.  7. BPH-on Proscar and Flomax 8. Statin intolerant so not on-will defer to cardiology after discharge  9. GI- will ask for speech evaluation as want to avoid aspiration. Etiology could be secondary to ET tube. 10. Please see progression orders  Donielle Liston Alba PA-C 08/25/2017 10:56 AM  Patient has notes in chart about elevated hemi diapgram but not present on preop ct or chest xray  Patient awake and alert neuro intact  Ambulate as tolerated  I have seen and examined [name] and agree with the above assessment  and plan.  Grace Isaac MD Beeper [phone] Office [phone] 08/25/2017 12:14 PM

## 2017-08-25 NOTE — Plan of Care (Signed)
Pt progressing as expected. Increasing activity as tolerated and encouraging use of incentive spirometer. Pt has a congested cough with hx of respiratory illness will utilize PRN order of xopenex as needed and will continue to use cough and deep breathing techniques. Pt on IV abx.

## 2017-08-25 NOTE — Progress Notes (Signed)
EKG CRITICAL VALUE     12 lead EKG performed.  Critical value noted.  Carmine Savoy, RN notified.   Genia Plants, CCT 08/25/2017 7:49 AM

## 2017-08-25 NOTE — Progress Notes (Signed)
  Critical Value:  Troponin 0.69 Date & Time Notied: 08/25/17 2250  Provider Notified: MD Gilford Raid  MD reported that elevated troponin likely due to recent cardiac surgery; no orders received.

## 2017-08-26 ENCOUNTER — Inpatient Hospital Stay (HOSPITAL_COMMUNITY): Payer: Medicare Other

## 2017-08-26 ENCOUNTER — Other Ambulatory Visit: Payer: Self-pay

## 2017-08-26 LAB — BASIC METABOLIC PANEL
Anion gap: 9 (ref 5–15)
BUN: 25 mg/dL — ABNORMAL HIGH (ref 6–20)
CO2: 22 mmol/L (ref 22–32)
Calcium: 8.3 mg/dL — ABNORMAL LOW (ref 8.9–10.3)
Chloride: 105 mmol/L (ref 101–111)
Creatinine, Ser: 1.61 mg/dL — ABNORMAL HIGH (ref 0.61–1.24)
GFR calc Af Amer: 43 mL/min — ABNORMAL LOW (ref >60)
GFR calc non Af Amer: 37 mL/min — ABNORMAL LOW (ref >60)
Glucose, Bld: 138 mg/dL — ABNORMAL HIGH (ref 65–99)
Potassium: 4.3 mmol/L (ref 3.5–5.1)
Sodium: 136 mmol/L (ref 135–145)

## 2017-08-26 LAB — GLUCOSE, CAPILLARY
Glucose-Capillary: 118 mg/dL — ABNORMAL HIGH (ref 65–99)
Glucose-Capillary: 130 mg/dL — ABNORMAL HIGH (ref 65–99)
Glucose-Capillary: 111 mg/dL — ABNORMAL HIGH (ref 65–99)
Glucose-Capillary: 139 mg/dL — ABNORMAL HIGH (ref 65–99)

## 2017-08-26 LAB — CBC
HCT: 32.3 % — ABNORMAL LOW (ref 39.0–52.0)
Hemoglobin: 10.5 g/dL — ABNORMAL LOW (ref 13.0–17.0)
MCH: 29.2 pg (ref 26.0–34.0)
MCHC: 32.5 g/dL (ref 30.0–36.0)
MCV: 90 fL (ref 78.0–100.0)
Platelets: 173 10*3/uL (ref 150–400)
RBC: 3.59 MIL/uL — ABNORMAL LOW (ref 4.22–5.81)
RDW: 14.8 % (ref 11.5–15.5)
WBC: 20.6 10*3/uL — ABNORMAL HIGH (ref 4.0–10.5)

## 2017-08-26 MED ORDER — FUROSEMIDE 10 MG/ML IJ SOLN
40.0000 mg | Freq: Once | INTRAMUSCULAR | Status: AC
  Administered 2017-08-26: 40 mg via INTRAVENOUS
  Filled 2017-08-26: qty 4

## 2017-08-26 MED ORDER — TRAMADOL HCL 50 MG PO TABS: 50.0000 mg | ORAL_TABLET | Freq: Two times a day (BID) | ORAL | Status: DC | PRN

## 2017-08-26 NOTE — Discharge Summary (Signed)
Physician Discharge Summary       [address]       [place] [address]             [phone]    Patient ID: Tim [name] MRN: [id number] DOB/AGE: [birth year] 82 y.o.  Admit date: 08/24/2017 Discharge date: 09/03/2017  Admission Diagnoses: Coronary artery disease-STEMI 2001, PCI, RCA, residual 100% circumflex  /   PCI for in-stent restenosis June, 2001  /  nuclear February, 2010, no ischemia  Active Diagnoses:  1. Carotid artery disease ([place]) 2. Hypertension 3. Hyperlipidemia 4. [place] 5. Tobacco abuse 6. Statin intolerance-Elevated CPK in the past 7. Low back pain 8. Paralyzed hemidiaphragm-right 9. GERD (gastroesophageal reflux disease) 10. Cervical disc disease-Status post neurosurgery 11. ABL anemia 12. AKI 13. A fib with RVR  Procedure (s):  Coronary artery bypass grafting x4 with the left internal mammary to the left anterior descending coronary artery, reversed saphenous vein graft to the diagonal coronary artery, reversed saphenous vein graft to the distal circumflex coronary artery, reversed saphenous vein graft to the posterior descending coronary artery with bilateral right and left greater saphenous endoscopic vein harvesting by Dr. Servando Snare on 08/24/2017.  History of Presenting Illness: This is an 83 y.o. male who was seen in the office on 08/12/2017  for consideration of coronary artery bypass grafting.  Patient has known coronary artery disease, having had stents placed in the past.  About 3-4 weeks ago he was admitted to [facility] with acute exacerbation of shortness of breath during the night.  He was treated and released and made arrangements to see Dr. Marlou Porch soon afterwards.  Cardiac catheterization was recommended and performed November 15th. The patient notes episodic shortness of breath with chest discomfort sometimes radiating into the left arm.  This occurred at night and also with exertion.  Patient continues to  work in his home shop on a daily basis, but does limit his activities notes that he can no longer climb under the house. Patient has history of multiple visits for bladder outlet obstruction prostatitis and chronic obstructive pulmonary disease with acute exacerbations. He is a former patient of Dr. Gwenette Greet with COPD and a paralyzed R Hemidiaphragm as seen on fluoroscopy in 2007. Lung function testing in 2005 showed clear airflow obstruction, FEV1 of 1.53 L (43% predicted), total lung capacity 62% predicted, DLCO 73% predicted. He smoked 2-3 packs per day for 40 years, quit around 1980. 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 In addition, he has a small peripheral pulmonary nodule, thought to be a granuloma was noted in November 2015 on chest x-ray, not noted on film 02/05/2017.  Dr. Servando Snare discussed the need for coronary artery bypass grafting surgery. The patient did need further studies before deciding to undergo coronary artery bypass grafting surgery. He had a CT of the chest which showed worrisome pulmonary nodules and pulmonary function studies does not show prohibitive risk. Pre operative carotid duplex US showed no significant right internal carotid artery stenosis and a 40-59% left internal carotid artery stenosis. Potential risks, benefits, and complications were discussed with the patient and hea agreed to proceed with surgery. The patient agreed to proceed with surgery and underwent a CABG x 4 on 08/24/2017.  Brief Hospital Course:  The patient was extubated the evening of surgery without difficulty. He remained afebrile and hemodynamically stable. He was initially AAI paced. He was weaned off of Neo Synephrine drip. Gordy Councilman, a line, chest tubes,  and foley were removed early in the post operative course. Lopressor was started and titrated accordingly. He did go into atrial fibrillation with RVR. He was put on both Amiodarone an Cardizem drips as the rate was  difficult to control. He later converted to sinus rhythm. Lopressor was stopped and he was put back on Atenolol 50 mg daily. He did have complaints of difficulty swallowing on post op day one. This could have been from TEE or ET tube. Speech was consulted and their recommendations were followed accordingly. His creatinine was elevated post op;it went as high as 1.81. This did eventually normalize. He was volume over loaded and diuresed. He had ABL anemia. He did not require a post op transfusion. Last H and H was 9 and 28. He was weaned off the insulin drip.   The patient's glucose remained well controlled.The patient's HGA1C pre op was 6.4.  He is likely pre diabetic and will require further follow up with his medical doctor after discharge. The patient was felt surgically stable for transfer from the ICU to PCTU for further convalescence on 08/30/2017. He continues to progress with cardiac rehab. He was ambulating on 2 liters of oxygen via [place]. He may require oxygen at discharge.  He has been tolerating a diet and has had a bowel movement. Epicardial pacing wires were removed on 08/31/2017. He was put on Atenolol as taken prior to surgery. Unfortunately, he developed wheezing. He was given Xopenex PRN and as discussed with Dr. Servando Snare, Atenolol was stopped and he was put on low dose Lopressor 12.5 mg bid. He had much less wheezing. Chest tube sutures will be removed the day of discharge. It is felt patient would benefit from SNF as he is very deconditioned at discharge. The patient is felt surgically stable for discharge today.  We ask the SNF to please do the following: 1. Please obtain vital signs at least one time daily 2.Please weigh the patient daily. If he or she continues to gain weight or develops lower extremity edema, contact the office at [phone]. 3. Ambulate patient at least three times daily and please use sternal precautions.  Latest Vital Signs: Blood pressure (!) 91/52, pulse 93,  temperature 98.2 F (36.8 C), temperature source Oral, resp. rate 20, height 5\' 11"  (1.803 m), weight 195 lb 11.2 oz (88.8 kg), SpO2 96 %.  Physical Exam: Cardiovascular: RRR Pulmonary: Mostly clear Abdomen: Soft, non tender, bowel sounds present. Extremities: Trace bilateral lower extremity edema. Wounds: Clean and dry.  Slight erythema around distal sternal incision. No drainage.   Discharge Condition: Stable and discharge to home.  Recent laboratory studies:  Lab Results  Component Value Date   WBC 9.9 08/31/2017   HGB 9.0 (L) 08/31/2017   HCT 28.0 (L) 08/31/2017   MCV 88.9 08/31/2017   PLT 301 08/31/2017   Lab Results  Component Value Date   NA 139 09/02/2017   K 4.1 09/02/2017   CL 105 09/02/2017   CO2 27 09/02/2017   CREATININE 1.34 (H) 09/02/2017   GLUCOSE 134 (H) 09/02/2017    Diagnostic Studies:  Ct Chest Wo Contrast  Result Date: 08/13/2017 CLINICAL DATA:  Possible preop for bypass surgery. Lung nodule. Cough. Shortness of breath on exertion. Remote history of smoking. EXAM: CT CHEST WITHOUT CONTRAST TECHNIQUE: Multidetector CT imaging of the chest was performed following the standard protocol without IV contrast. COMPARISON:  07/24/2017.  02/05/2017.  No prior CT. FINDINGS: Cardiovascular: Tortuous thoracic aorta. Aortic and branch vessel atherosclerosis. Normal  heart size, without pericardial effusion. Multivessel coronary artery atherosclerosis. Pulmonary artery enlargement, outflow tract 3.3 cm. Mediastinum/Nodes: No mediastinal or definite hilar adenopathy, given limitations of unenhanced CT. Lungs/Pleura: No pleural fluid.  Moderate centrilobular emphysema. Calcified right upper lobe granuloma 5 mm on image 46/series 5. inferior right upper lobe ground-glass nodule measures 12 mm on image 78/series 5 is felt to be similar to on the prior exam (given thinner slice collimation today). Subpleural bibasilar, lingular and inferior right middle lobe ground-glass opacity  and septal thickening. The right lower lobe and right middle lobe opacities were present on the prior, favoring atelectasis. There is also patchy peripheral lingular and superior segment left lower lobe ground-glass opacity. Upper Abdomen: Central left hepatic lobe cyst. Normal imaged portions of the spleen, stomach, adrenal glands, kidneys. Pancreatic parenchymal calcifications. Musculoskeletal: Probable sebaceous cyst about the upper right paraspinous region at 2.1 cm on image 23/series 5. Bilateral glenohumeral joint osteoarthritis. Moderate thoracic spondylosis. IMPRESSION: 1. No dominant or suspicious pulmonary nodule. 2. inferior right upper lobe ground-glass nodule is felt to be similar back to 11/16/2006, most consistent with a benign etiology. 3. Multifocal areas of ground-glass opacity with scattered foci septal thickening. Some of these were present in 2008 and could represent atelectasis. Others are new and increased. Considerations include mild infection (including atypical etiologies) or pulmonary edema (less likely given lack of pleural fluid). 4. Pulmonary artery enlargement suggests pulmonary arterial hypertension. 5. Coronary artery atherosclerosis. Aortic Atherosclerosis (ICD10-I70.0). 6. Chronic calcific pancreatitis. Electronically Signed   By: Abigail Miyamoto M.D.   On: 08/13/2017 13:48   Dg Chest Port 1 View  Result Date: 08/30/2017 CLINICAL DATA:  Shortness of breath.  CABG on 08/24/2017. EXAM: PORTABLE CHEST 1 VIEW COMPARISON:  08/29/2017 and 08/28/2017 and 08/27/2017 FINDINGS: There are persistent small bilateral pleural effusions, left greater than right with persistent slight atelectasis at the left lung base. Pulmonary vascularity is normal.  No pneumothorax. Sheath remains in the superior vena cava. No acute bone abnormality. IMPRESSION: 1. Persistent small effusions, left greater than right. 2. Persistent slight atelectasis at the left lung base. Electronically Signed   By: Lorriane Shire M.D.   On: 08/30/2017 08:05  Discharge Instructions    Amb Referral to Cardiac Rehabilitation   Complete by:  As directed    Diagnosis:  CABG   CABG X ___:  4     Discharge Medications: Allergies as of 09/03/2017      Reactions   Statins Other (See Comments)   CLASS EFFECT SEVERE LEG CRAMPS [MULTIPLE STATINS]      Medication List    STOP taking these medications   atenolol 50 MG tablet Commonly known as:  TENORMIN   isosorbide mononitrate 30 MG 24 hr tablet Commonly known as:  IMDUR     TAKE these medications   acetaminophen 500 MG tablet Commonly known as:  TYLENOL Take 500 mg by mouth every 8 (eight) hours as needed (for pain.).   albuterol 108 (90 Base) MCG/ACT inhaler Commonly known as:  PROVENTIL HFA;VENTOLIN HFA Inhale 2 puffs into the lungs every 6 (six) hours as needed for wheezing or shortness of breath.   albuterol (2.5 MG/3ML) 0.083% nebulizer solution Commonly known as:  PROVENTIL Take 3 mLs (2.5 mg total) by nebulization every 6 (six) hours as needed for wheezing or shortness of breath.   amiodarone 200 MG tablet Commonly known as:  PACERONE Take 1 tablet (200 mg total) by mouth daily.   aspirin EC 81 MG tablet Take  81 mg by mouth daily.   azelastine 0.1 % nasal spray Commonly known as:  ASTELIN Place 2 sprays into both nostrils 2 (two) times daily. Use in each nostril as directed   BESIVANCE 0.6 % Susp Generic drug:  Besifloxacin HCl Place 1 drop into the left eye See admin instructions. INSTILL 1 DROP INTO LEFT EYE FOUR TIMES DAILY FOR 2 DAYS AFTER EACH MONTHLY EYE INJECTION   doxycycline 20 MG tablet Commonly known as:  PERIOSTAT Take 20 mg by mouth daily.   feeding supplement (GLUCERNA SHAKE) Liqd Take 237 mLs by mouth 3 (three) times daily with meals.   finasteride 5 MG tablet Commonly known as:  PROSCAR Take 5 mg by mouth 2 (two) times daily.   fluticasone furoate-vilanterol 100-25 MCG/INH Aepb Commonly known as:  BREO  ELLIPTA Inhale 1 puff into the lungs daily.   furosemide 40 MG tablet Commonly known as:  LASIX Take 40 mg by mouth daily as needed for fluid (chest pressure).   guaiFENesin 600 MG 12 hr tablet Commonly known as:  MUCINEX Take 1 tablet (600 mg total) by mouth 2 (two) times daily as needed.   ICAPS PO Take 1 capsule by mouth daily.   metoprolol tartrate 25 MG tablet Commonly known as:  LOPRESSOR Take 0.5 tablets (12.5 mg total) by mouth 2 (two) times daily.   omeprazole 40 MG capsule Commonly known as:  PRILOSEC Take 1 capsule (40 mg total) by mouth daily. What changed:  when to take this   potassium chloride SA 20 MEQ tablet Commonly known as:  K-DUR,KLOR-CON Take 1 tablet (20 mEq total) by mouth daily. Start taking on:  09/04/2017   tamsulosin 0.4 MG Caps capsule Commonly known as:  FLOMAX Take 0.4 mg by mouth 2 (two) times daily. For urinary symptoms   traMADol 50 MG tablet Commonly known as:  ULTRAM Take 1 tablet (50 mg total) by mouth every 6 (six) hours as needed for moderate pain.            Durable Medical Equipment  (From admission, onward)        Start     Ordered   09/01/17 1334  For home use only DME oxygen  Once    Question Answer Comment  Mode or (Route) Nasal cannula   Liters per Minute 2   Frequency Continuous (stationary and portable oxygen unit needed)   Oxygen conserving device Yes   Oxygen delivery system Gas      09/01/17 1334   08/31/17 0735  For home use only DME 3 n 1  Once     08/31/17 0735   08/31/17 0734  For home use only DME Walker rolling  Once    Question:  Patient needs a walker to treat with the following condition  Answer:  Physical deconditioning   08/31/17 0735    The patient has been discharged on:   1.Beta Blocker:  Yes [  x ]                              No   [   ]                              If No, reason:  2.Ace Inhibitor/ARB: Yes [   ]  No  [ x   ]                                      If No, reason: labile BP 3.Statin:   Yes [   ]                  No  [ x  ]                  If No, reason:Intolerant  4.[place]:  Yes  [  x ]                  No   [   ]                  If No, reason:  Follow Up Appointments: Follow-up Information    Grace Isaac, MD. Go on 10/04/2017.   Specialty:  Cardiothoracic Surgery Why:  PA/LAT CXR to be taken (at [facility] which is in the same building as Dr. Everrett Coombe office) on 10/04/2017 at 12:30 pm with the physician assistant;Appointment time is at 1:00 pm Contact information: [address] [place] [address] [phone]        Raelene Bott, MD. Call.   Specialty:  Internal Medicine Why:  for a follow up appointment regarding further surveillance of HGA1C 6.4 (pre diabetes) Contact information: [place] [address] [phone]        Isaiah Serge, NP. Go on 09/15/2017.   Specialties:  Cardiology, Radiology Why:  Appointment time is at 11:00 am Contact information: [address] [place] [address] [phone]           Signed: Ellamae Sia 09/03/2017, 3:54 PM

## 2017-08-26 NOTE — Progress Notes (Signed)
  Speech Language Pathology Treatment: Dysphagia  Patient Details Name: KWASI JOUNG MRN: [id number] DOB: [birth year] Today's Date: 08/26/2017 Time: 2841-3244 SLP Time Calculation (min) (ACUTE ONLY): 15 min  Assessment / Plan / Recommendation Clinical Impression  Pt reported improvement with swallowing with breakfast and did not have pharyngeal globus sensation with po's. Observed with regular, Dys 2 texture and thin liquids. No s/s aspiration and no sensation of residuals. SLP explained choices of textures and discussed with pt who chose to initiate Dys 2, continue thin. Pt stated he took handful of pills this morning with water- educated him to take 1-2 at a time depending on size. Pt cognitively intact, pleasant and motivated. Will continue to treat and advance texture as he is able.    HPI HPI: SHARROD ACHILLE y.o.malewho underwent CABG x 4 08/24/17. 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 Small peripheral pulmonary nodule, thought to be a granuloma was noted in November 2015 on chest x-ray, not noted on film 02/05/2017. CXR Stable low lung volumes, bilateral pleural effusions, and bibasilar airspace disease, likely atelectasis. ST ordered after pt stated he had difficulty swallowing applesauce.       SLP Plan  Continue with current plan of care       Recommendations  Diet recommendations: Dysphagia 2 (fine chop);Thin liquid Liquids provided via: Cup;Straw Medication Administration: Whole meds with liquid Supervision: Patient able to self feed;Intermittent supervision to cue for compensatory strategies Compensations: Slow rate;Small sips/bites Postural Changes and/or Swallow Maneuvers: Seated upright 90 degrees                Oral Care Recommendations: Oral care BID Follow up Recommendations: None SLP Visit Diagnosis: Dysphagia, unspecified (R13.10) Plan: Continue  with current plan of care       GO                Houston Siren 08/26/2017, 10:43 AM  Orbie Pyo Colvin Caroli.[employer] [phone]

## 2017-08-26 NOTE — Evaluation (Signed)
Physical Therapy Evaluation Patient Details Name: Tim [name] MRN: [id number] DOB: 05/29/1929 Today's Date: 08/26/2017   History of Present Illness  Pt is an 83 y.o. male s/p CABGx4 on 08/24/17. Pertinent PMH includes paralyzed hemidiaphragm, emphysema, CAD, AAA, asthma.     Clinical Impression  Pt presents with decreased activity tolerance, decreased balance, and an overall decrease in functional mobility secondary to above. PTA, pt mod indep with SPC; lives with children who are available for 24/7 support if needed. Today, able to transfer and ambulate with RW and min guard. Pt with good ability to maintain sternal precautions with intermittent cues. Overall, moving very well. Pt would benefit from continued acute PT services to maximize functional mobility and independence prior to d/c with HHPT services. Recommend OT consult.    Follow Up Recommendations Home health PT;Supervision - Intermittent    Equipment Recommendations  Rolling walker with 5" wheels    Recommendations for Other Services OT consult     Precautions / Restrictions Precautions Precautions: Sternal;Fall Restrictions Other Position/Activity Restrictions: Sternal      Mobility  Bed Mobility Overal bed mobility: Needs Assistance Bed Mobility: Supine to Sit;Sit to Sidelying;Rolling Rolling: Min guard   Supine to sit: Min guard;HOB elevated   Sit to sidelying: Min assist General bed mobility comments: Cues for log roll technique when returning to supine in order to maintain sternal precautions; only assist was minA to help BLEs back into bed. Pt does not tolerate lying flat well (props up with pillows at home)  Transfers Overall transfer level: Needs assistance Equipment used: 4-wheeled walker(Eva walker) Transfers: Sit to/from Stand Sit to Stand: Min guard         General transfer comment: Able to rock into standing with hands on knees and min guard for balance. Good eccentric control into sitting  with hands on knees  Ambulation/Gait Ambulation/Gait assistance: Min guard;Supervision Ambulation Distance (Feet): 500 Feet Assistive device: 4-wheeled walker(Eva walker) Gait Pattern/deviations: Step-through pattern;Decreased stride length;Trunk flexed Gait velocity: Decreased Gait velocity interpretation: <1.8 ft/sec, indicative of risk for recurrent falls General Gait Details: Min guard progressing to supervision for safety. 2x standing rest break secondary to fatigue. HR up to 132. Overall pt moving very well. Intermittent cues for upright posture and deep breathing technique  Stairs            Wheelchair Mobility    Modified Rankin (Stroke Patients Only)       Balance Overall balance assessment: Needs assistance   Sitting balance-Leahy Scale: Fair       Standing balance-Leahy Scale: Fair Standing balance comment: Can static stand with no UE support                             Pertinent Vitals/Pain Pain Assessment: Faces Faces Pain Scale: Hurts a little bit Pain Location: Sternal incision Pain Descriptors / Indicators: Sore;Discomfort Pain Intervention(s): Monitored during session;Limited activity within patient's tolerance    Home Living Family/patient expects to be discharged to:: Private residence Living Arrangements: Children Available Help at Discharge: Family;Available 24 hours/day Type of Home: House Home Access: Ramped entrance     Home Layout: One level Home Equipment: Walker - 4 wheels;Cane - single point Additional Comments: Pt lives with son and daughter-in-law; if they are gone at work, has other children who will stay with him if needed (aka can have 24/7 support)    Prior Function Level of Independence: Independent with assistive device(s)  Comments: Mod indep with SPC     Hand Dominance        Extremity/Trunk Assessment   Upper Extremity Assessment Upper Extremity Assessment: RUE deficits/detail;LUE  deficits/detail RUE Deficits / Details: sternal precautions LUE Deficits / Details: sternal precautions    Lower Extremity Assessment Lower Extremity Assessment: Generalized weakness    Cervical / Trunk Assessment Cervical / Trunk Assessment: Kyphotic  Communication   Communication: HOH  Cognition Arousal/Alertness: Awake/alert Behavior During Therapy: WFL for tasks assessed/performed Overall Cognitive Status: Within Functional Limits for tasks assessed                                        General Comments General comments (skin integrity, edema, etc.): SpO2 >90% on 2L O2 [place]. HR up to 132 with ambulation    Exercises     Assessment/Plan    PT Assessment Patient needs continued PT services  PT Problem List Decreased strength;Decreased range of motion;Decreased activity tolerance;Decreased balance;Decreased mobility;Decreased knowledge of use of DME;Decreased knowledge of precautions;Cardiopulmonary status limiting activity;Pain       PT Treatment Interventions DME instruction;Gait training;Stair training;Therapeutic activities;Therapeutic exercise;Balance training;Functional mobility training;Patient/family education    PT Goals (Current goals can be found in the Care Plan section)  Acute Rehab PT Goals Patient Stated Goal: Get some rest PT Goal Formulation: With patient Time For Goal Achievement: 09/09/17 Potential to Achieve Goals: Good    Frequency Min 3X/week   Barriers to discharge        Co-evaluation               AM-PAC PT "6 Clicks" Daily Activity  Outcome Measure Difficulty turning over in bed (including adjusting bedclothes, sheets and blankets)?: None Difficulty moving from lying on back to sitting on the side of the bed? : Unable Difficulty sitting down on and standing up from a chair with arms (e.g., wheelchair, bedside commode, etc,.)?: A Little Help needed moving to and from a bed to chair (including a wheelchair)?: A  Little Help needed walking in hospital room?: A Little Help needed climbing 3-5 steps with a railing? : A Little 6 Click Score: 17    End of Session Equipment Utilized During Treatment: Gait belt;Oxygen Activity Tolerance: Patient tolerated treatment well Patient left: in bed;with call bell/phone within reach;with nursing/sitter in room Nurse Communication: Mobility status PT Visit Diagnosis: Other abnormalities of gait and mobility (R26.89);Pain Pain - part of body: (Sternum)    Time: 0981-1914 PT Time Calculation (min) (ACUTE ONLY): 27 min   Charges:   PT Evaluation $PT Eval Moderate Complexity: 1 Mod PT Treatments $Gait Training: 8-22 mins   PT G Codes:       Mabeline Caras, PT, DPT Acute Rehab Services  Pager: [place] 08/26/2017, 4:39 PM

## 2017-08-26 NOTE — Progress Notes (Signed)
Patient ID: GRYPHON VANDERVEEN, male   DOB: 08/08/1929, 83 y.o.   MRN: [id number] T[facility] DAILY ICU PROGRESS NOTE                   [address]            ,[place] [address]          [phone]   2 Days Post-Op Procedure(s) (LRB): CORONARY ARTERY BYPASS GRAFTING times four using left internal mammary artery and bilateral saphenous vein, using endoscope. TEE (N/A) TRANSESOPHAGEAL ECHOCARDIOGRAM (TEE) (N/A)  Total Length of Stay:  LOS: 2 days   Subjective: Patient up in chair, walked in the unit this morning  Objective: Vital signs in last 24 hours: Temp:  [97.7 F (36.5 C)-99.1 F (37.3 C)] 97.7 F (36.5 C) (12/13 0400) Pulse Rate:  [88-106] 106 (12/13 0700) Cardiac Rhythm: Atrial paced (12/13 0400) Resp:  [15-33] 15 (12/13 0700) BP: (95-136)/(55-98) 111/73 (12/13 0700) SpO2:  [80 %-100 %] 100 % (12/13 0700) Arterial Line BP: (103-109)/(53-65) 105/65 (12/12 1100) Weight:  [191 lb 9.3 oz (86.9 kg)] 191 lb 9.3 oz (86.9 kg) (12/13 0500)  Filed Weights   08/24/17 1415 08/25/17 0500 08/26/17 0500  Weight: 198 lb 11.2 oz (90.1 kg) 205 lb 0.4 oz (93 kg) 191 lb 9.3 oz (86.9 kg)    Weight change: -1.9 oz (-3.23 kg)   Hemodynamic parameters for last 24 hours: PAP: (45-50)/(23-31) 45/23  Intake/Output from previous day: 12/12 0701 - 12/13 0700 In: 1303.7 [P.O.:690; I.V.:563.7; IV Piggyback:50] Out: 1180 [Urine:990; Chest Tube:190]  Intake/Output this shift: No intake/output data recorded.  Current Meds: Scheduled Meds: . acetaminophen  1,000 mg Oral Q6H   Or  . acetaminophen (TYLENOL) oral liquid 160 mg/5 mL  1,000 mg Per Tube Q6H  . aspirin EC  325 mg Oral Daily   Or  . aspirin  324 mg Per Tube Daily  . azelastine  2 spray Each Nare BID  . bisacodyl  10 mg Oral Daily   Or  . bisacodyl  10 mg Rectal Daily  . Chlorhexidine Gluconate Cloth  6 each Topical Daily  . docusate sodium  200 mg Oral Daily  . enoxaparin (LOVENOX) injection  30 mg Subcutaneous  Q24H  . finasteride  5 mg Oral Daily  . fluticasone furoate-vilanterol  1 puff Inhalation Daily  . insulin aspart  0-24 Units Subcutaneous Q4H  . mouth rinse  15 mL Mouth Rinse BID  . metoprolol tartrate  12.5 mg Oral BID   Or  . metoprolol tartrate  12.5 mg Per Tube BID  . pantoprazole  40 mg Oral Daily  . sodium chloride flush  10-40 mL Intracatheter Q12H  . sodium chloride flush  3 mL Intravenous Q12H  . tamsulosin  0.4 mg Oral BID   Continuous Infusions: . sodium chloride 10 mL/hr at 08/24/17 2100  . sodium chloride    . sodium chloride Stopped (08/24/17 1500)  . cefUROXime (ZINACEF)  IV Stopped (08/25/17 2059)  . dexmedetomidine (PRECEDEX) IV infusion Stopped (08/24/17 1849)  . lactated ringers Stopped (08/25/17 1200)  . lactated ringers 20 mL/hr at 08/26/17 0700  . nitroGLYCERIN Stopped (08/24/17 1823)  . phenylephrine (NEO-SYNEPHRINE) Adult infusion Stopped (08/25/17 1200)   PRN Meds:.sodium chloride, levalbuterol, metoprolol tartrate, morphine injection, ondansetron (ZOFRAN) IV, oxyCODONE, sodium chloride flush, sodium chloride flush, traMADol  General appearance: alert, cooperative, appears stated age and no distress Neurologic: intact Heart: regular rate and rhythm, S1, S2 normal, no murmur, click,  rub or gallop Lungs: diminished breath sounds bibasilar Abdomen: soft, non-tender; bowel sounds normal; no masses,  no organomegaly Extremities: extremities normal, atraumatic, no cyanosis or edema and Homans sign is negative, no sign of DVT Wound: Sternum is stable  Lab Results: CBC: Recent Labs    08/25/17 0208 08/25/17 1712 08/26/17 0434  WBC 21.8*  --  20.6*  HGB 10.9* 10.2* 10.5*  HCT 33.2* 30.0* 32.3*  PLT 195  --  173   BMET:  Recent Labs    08/25/17 0208 08/25/17 1712 08/25/17 2031 08/26/17 0434  NA 136 140  --  136  K 4.3 4.6  --  4.3  CL 109  --   --  105  CO2 20*  --   --  22  GLUCOSE 119* 149*  --  138*  BUN 16  --   --  25*  CREATININE 1.09   --  1.65* 1.61*  CALCIUM 7.8*  --   --  8.3*    CMET: Lab Results  Component Value Date   WBC 20.6 (H) 08/26/2017   HGB 10.5 (L) 08/26/2017   HCT 32.3 (L) 08/26/2017   PLT 173 08/26/2017   GLUCOSE 138 (H) 08/26/2017   ALT 18 08/20/2017   AST 13 (L) 08/20/2017   NA 136 08/26/2017   K 4.3 08/26/2017   CL 105 08/26/2017   CREATININE 1.61 (H) 08/26/2017   BUN 25 (H) 08/26/2017   CO2 22 08/26/2017   TSH 1.10 10/31/2012   INR 1.39 08/24/2017   HGBA1C 6.4 (H) 08/20/2017      PT/INR:  Recent Labs    08/24/17 1401  LABPROT 17.0*  INR 1.39   Radiology: Dg Chest Port 1 View  Result Date: 08/26/2017 CLINICAL DATA:  Shortness of breath.  Chest tube. EXAM: PORTABLE CHEST 1 VIEW COMPARISON:  08/25/2017 . FINDINGS: Interim removal of Swan Ganz catheter. IJ sheath in stable position. Left chest tube in stable position. Prior CABG. Cardiomegaly with normal pulmonary vascularity. Low lung volumes with basilar atelectasis. Small bilateral pleural effusions. Prior cervical spine fusion . IMPRESSION: 1. Interim removal Swan-Ganz catheter. Right IJ sheath in stable position. Left chest tube stable position. No pneumothorax. 2.  Low lung volumes with bibasilar atelectasis/ infiltrates. 3.  Prior CABG.  Stable cardiomegaly . Electronically Signed   By: Marcello Moores  Register   On: 08/26/2017 07:54   Acute Kidney Injury (any one)  Increase in SCr by > 0.3 within 48 hours  Increase SCr to > 1.5 times baseline  Urine volume < 0.5 ml/kg/h for 6 hrs  ?Stage 1 - Increase in serum creatinine to 1.5 to 1.9 times baseline, or increase in serum creatinine by ?0.3 mg/dL (?26.5 micromol/L), or reduction in urine output to <0.5 mL/kg per hour for 6 to 12 hours.  ?Stage 2 - Increase in serum creatinine to 2.0 to 2.9 times baseline, or reduction in urine output to <0.5 mL/kg per hour for ?12 hours.  ?Stage 3 - Increase in serum creatinine to 3.0 times baseline, or increase in serum creatinine to ?4.0 mg/dL (?353.6  micromol/L), or reduction in urine output to <0.3 mL/kg per hour for ?24 hours, or anuria for ?12 hours, or the initiation of renal replacement therapy, or, in patients <18 years, decrease in eGFR to <35 mL   Lab Results  Component Value Date   CREATININE 1.61 (H) 08/26/2017   Estimated Creatinine Clearance: 35.1 mL/min (A) (by C-G formula based on SCr of 1.61 mg/dL (H)).   Assessment/Plan:  S/P Procedure(s) (LRB): CORONARY ARTERY BYPASS GRAFTING times four using left internal mammary artery and bilateral saphenous vein, using endoscope. TEE (N/A) TRANSESOPHAGEAL ECHOCARDIOGRAM (TEE) (N/A) Stage I acute kidney injury with creatinine rise to 1.6, patient continues to have adequate urine output 750 mL overnight, will leave Foley in another 24 hours to monitor urine output DC left chest tube Continue ambulation and mobility Expected Acute  Blood - loss Anemia- continue to monitor  Respiratory status appears stable   Grace Isaac 08/26/2017 8:11 AM

## 2017-08-26 NOTE — Progress Notes (Signed)
TCTS BRIEF SICU PROGRESS NOTE  2 Days Post-Op  S/P Procedure(s) (LRB): CORONARY ARTERY BYPASS GRAFTING times four using left internal mammary artery and bilateral saphenous vein, using endoscope. TEE (N/A) TRANSESOPHAGEAL ECHOCARDIOGRAM (TEE) (N/A)   Stable day NSR w/ stable BP O2 sats 98% on 2 L/min UOP marginal but adequate  Plan: Continue current plan  Rexene Alberts, MD 08/26/2017 7:53 PM

## 2017-08-27 ENCOUNTER — Inpatient Hospital Stay (HOSPITAL_COMMUNITY): Payer: Medicare Other

## 2017-08-27 LAB — CBC
HCT: 28.3 % — ABNORMAL LOW (ref 39.0–52.0)
Hemoglobin: 9.3 g/dL — ABNORMAL LOW (ref 13.0–17.0)
MCH: 29.4 pg (ref 26.0–34.0)
MCHC: 32.9 g/dL (ref 30.0–36.0)
MCV: 89.6 fL (ref 78.0–100.0)
Platelets: 156 10*3/uL (ref 150–400)
RBC: 3.16 MIL/uL — ABNORMAL LOW (ref 4.22–5.81)
RDW: 15 % (ref 11.5–15.5)
WBC: 15 10*3/uL — ABNORMAL HIGH (ref 4.0–10.5)

## 2017-08-27 LAB — BASIC METABOLIC PANEL
Anion gap: 5 (ref 5–15)
Anion gap: 7 (ref 5–15)
BUN: 37 mg/dL — ABNORMAL HIGH (ref 6–20)
BUN: 41 mg/dL — ABNORMAL HIGH (ref 6–20)
CO2: 24 mmol/L (ref 22–32)
CO2: 22 mmol/L (ref 22–32)
Calcium: 8.1 mg/dL — ABNORMAL LOW (ref 8.9–10.3)
Calcium: 8 mg/dL — ABNORMAL LOW (ref 8.9–10.3)
Chloride: 106 mmol/L (ref 101–111)
Chloride: 105 mmol/L (ref 101–111)
Creatinine, Ser: 1.81 mg/dL — ABNORMAL HIGH (ref 0.61–1.24)
Creatinine, Ser: 1.81 mg/dL — ABNORMAL HIGH (ref 0.61–1.24)
GFR calc Af Amer: 37 mL/min — ABNORMAL LOW (ref >60)
GFR calc Af Amer: 37 mL/min — ABNORMAL LOW (ref >60)
GFR calc non Af Amer: 32 mL/min — ABNORMAL LOW (ref >60)
GFR calc non Af Amer: 32 mL/min — ABNORMAL LOW (ref >60)
Glucose, Bld: 128 mg/dL — ABNORMAL HIGH (ref 65–99)
Glucose, Bld: 134 mg/dL — ABNORMAL HIGH (ref 65–99)
Potassium: 4.2 mmol/L (ref 3.5–5.1)
Potassium: 4.1 mmol/L (ref 3.5–5.1)
Sodium: 135 mmol/L (ref 135–145)
Sodium: 134 mmol/L — ABNORMAL LOW (ref 135–145)

## 2017-08-27 LAB — GLUCOSE, CAPILLARY
Glucose-Capillary: 111 mg/dL — ABNORMAL HIGH (ref 65–99)
Glucose-Capillary: 114 mg/dL — ABNORMAL HIGH (ref 65–99)
Glucose-Capillary: 116 mg/dL — ABNORMAL HIGH (ref 65–99)
Glucose-Capillary: 117 mg/dL — ABNORMAL HIGH (ref 65–99)
Glucose-Capillary: 126 mg/dL — ABNORMAL HIGH (ref 65–99)
Glucose-Capillary: 140 mg/dL — ABNORMAL HIGH (ref 65–99)
Glucose-Capillary: 96 mg/dL (ref 65–99)

## 2017-08-27 MED FILL — Magnesium Sulfate Inj 50%: INTRAMUSCULAR | Qty: 10 | Status: AC

## 2017-08-27 MED FILL — Heparin Sodium (Porcine) Inj 1000 Unit/ML: INTRAMUSCULAR | Qty: 30 | Status: AC

## 2017-08-27 MED FILL — Potassium Chloride Inj 2 mEq/ML: INTRAVENOUS | Qty: 40 | Status: AC

## 2017-08-27 NOTE — Progress Notes (Signed)
  Speech Language Pathology Treatment: Dysphagia  Patient Details Name: Tim [name] MRN: [id number] DOB: 1929/06/06 Today's Date: 08/27/2017 Time: 7867-5449 SLP Time Calculation (min) (ACUTE ONLY): 15 min  Assessment / Plan / Recommendation Clinical Impression  ST follow up for therapeutic diet tolerance and possible diet advancement.  The patient is reporting resolution to swallowing issues noted immediately post op.  Chart review revealed that the patient's lungs have been within defined limits except diminished and he has been afebrile.  Per the patient intake has been fair due to no appetite.  Most recent chest xray is showing bibasilar atelectasis.  Nursing reporting no obvious issues.  The patient was observed with dry solids and thin liquids via cup and straw sips.  The patient was observed to have an intermittent cough that was present with and without intake.  The patient is no longer complaining of a globus sensation.  Recommend advancing diet to regular with thin liquids.  ST will follow briefly for therapeutic diet tolerance.     HPI HPI: Tim [name] y.o.malewho underwent CABG x 4 08/24/17. 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 Small peripheral pulmonary nodule, thought to be a granuloma was noted in November 2015 on chest x-ray, not noted on film 02/05/2017. CXR Stable low lung volumes, bilateral pleural effusions, and bibasilar airspace disease, likely atelectasis. ST ordered after pt stated he had difficulty swallowing applesauce.       SLP Plan  Continue with current plan of care       Recommendations  Diet recommendations: Regular;Thin liquid Liquids provided via: Cup;Straw Medication Administration: Whole meds with liquid Supervision: Patient able to self feed Compensations: Slow rate;Small sips/bites Postural Changes and/or Swallow Maneuvers: Seated  upright 90 degrees                Oral Care Recommendations: Oral care BID Follow up Recommendations: None SLP Visit Diagnosis: Dysphagia, unspecified (R13.10) Plan: Continue with current plan of care       GO                Lamar Sprinkles 08/27/2017, 9:08 AM

## 2017-08-27 NOTE — Evaluation (Signed)
Occupational Therapy Evaluation Patient Details Name: Tim [name] MRN: [id number] DOB: 1929-05-07 Today's Date: 08/27/2017    History of Present Illness Pt is an 83 y.o. male s/p CABGx4 on 08/24/17. Pertinent PMH includes paralyzed hemidiaphragm, emphysema, CAD, AAA, asthma.    Clinical Impression   Pt was modified independent in ADL prior to admission. He presents with decreased activity tolerance, generalized weakness and balance deficits. Pt is unable to access his feet for ADL. He ambulated with min guard assist and RW. Anticipate good progress for a home discharge. Will follow acutely.    Follow Up Recommendations  Home health OT    Equipment Recommendations  3 in 1 bedside commode    Recommendations for Other Services       Precautions / Restrictions Precautions Precautions: Sternal;Fall Precaution Comments: pt able to stated 1 sternal precaution, educated Restrictions Weight Bearing Restrictions: Yes(sternal precautions)      Mobility Bed Mobility Overal bed mobility: Needs Assistance Bed Mobility: Supine to Sit           General bed mobility comments: supervision from bed in chair position, pt does not sleep in bed at home  Transfers Overall transfer level: Needs assistance Equipment used: Rolling walker (2 wheeled) Transfers: Sit to/from Stand Sit to Stand: Min guard         General transfer comment: Able to rock into standing with hands on knees and min guard for balance. Good eccentric control into sitting with hands on knees    Balance Overall balance assessment: Needs assistance   Sitting balance-Leahy Scale: Good       Standing balance-Leahy Scale: Fair Standing balance comment: Can static stand with no UE support                           ADL either performed or assessed with clinical judgement   ADL Overall ADL's : Needs assistance/impaired Eating/Feeding: Independent;Bed level   Grooming: Set up;Sitting   Upper  Body Bathing: Minimal assistance;Sitting   Lower Body Bathing: Sit to/from stand;Maximal assistance   Upper Body Dressing : Minimal assistance;Sitting   Lower Body Dressing: Maximal assistance;Sit to/from stand   Toilet Transfer: Min guard;Ambulation;RW;BSC   Toileting- Clothing Manipulation and Hygiene: Maximal assistance;Sit to/from stand       Functional mobility during ADLs: Min guard;Rolling walker       Vision Patient Visual Report: No change from baseline Additional Comments: has been getting injections in his L eye     Perception     Praxis      Pertinent Vitals/Pain Pain Assessment: No/denies pain     Hand Dominance Right   Extremity/Trunk Assessment Upper Extremity Assessment Upper Extremity Assessment: Overall WFL for tasks assessed RUE Deficits / Details: sternal precautions LUE Deficits / Details: sternal precautions   Lower Extremity Assessment Lower Extremity Assessment: Defer to PT evaluation       Communication Communication Communication: HOH   Cognition Arousal/Alertness: Awake/alert Behavior During Therapy: WFL for tasks assessed/performed Overall Cognitive Status: Within Functional Limits for tasks assessed                                     General Comments       Exercises     Shoulder Instructions      Home Living Family/patient expects to be discharged to:: Private residence Living Arrangements: Children(son and daughter in law) Available Help  at Discharge: Family;Available 24 hours/day Type of Home: House Home Access: Ramped entrance     Home Layout: One level     Bathroom Shower/Tub: Occupational psychologist: Handicapped height     Home Equipment: Environmental consultant - 4 wheels;Cane - single point;Shower seat;Grab bars - toilet;Grab bars - tub/shower   Additional Comments: pt states he needs a new shower seat      Prior Functioning/Environment Level of Independence: Independent with assistive  device(s)        Comments: Mod indep with SPC        OT Problem List: Decreased strength;Decreased activity tolerance;Impaired balance (sitting and/or standing);Decreased knowledge of precautions;Decreased knowledge of use of DME or AE;Cardiopulmonary status limiting activity      OT Treatment/Interventions: Self-care/ADL training;DME and/or AE instruction;Therapeutic activities;Patient/family education;Energy conservation    OT Goals(Current goals can be found in the care plan section) Acute Rehab OT Goals Patient Stated Goal: Get some rest OT Goal Formulation: With patient Time For Goal Achievement: 09/10/17 Potential to Achieve Goals: Good ADL Goals Pt Will Perform Grooming: with modified independence;standing Pt Will Perform Lower Body Bathing: with modified independence;with adaptive equipment;sit to/from stand Pt Will Perform Lower Body Dressing: with modified independence;sit to/from stand;with adaptive equipment Pt Will Transfer to Toilet: with modified independence;ambulating;bedside commode Pt Will Perform Toileting - Clothing Manipulation and hygiene: with modified independence;sit to/from stand;with adaptive equipment Pt Will Perform Tub/Shower Transfer: Shower transfer;with supervision;ambulating;shower seat;rolling walker Additional ADL Goal #1: Pt will generalize energy conservation strategies in ADL and mobility. Additional ADL Goal #2: Pt will generalize sternal precautions during ADL and mobility.  OT Frequency: Min 2X/week   Barriers to D/C:            Co-evaluation              AM-PAC PT "6 Clicks" Daily Activity     Outcome Measure Help from another person eating meals?: None Help from another person taking care of personal grooming?: A Little Help from another person toileting, which includes using toliet, bedpan, or urinal?: A Lot Help from another person bathing (including washing, rinsing, drying)?: A Lot Help from another person to put on and  taking off regular upper body clothing?: A Little Help from another person to put on and taking off regular lower body clothing?: A Lot 6 Click Score: 16   End of Session Equipment Utilized During Treatment: Gait belt;Rolling walker;Oxygen Nurse Communication: Mobility status  Activity Tolerance: Patient tolerated treatment well Patient left: in chair;with call bell/phone within reach;with nursing/sitter in room  OT Visit Diagnosis: Unsteadiness on feet (R26.81)                Time: 8250-5397 OT Time Calculation (min): 32 min Charges:  OT General Charges $OT Visit: 1 Visit OT Evaluation $OT Eval Moderate Complexity: 1 Mod OT Treatments $Self Care/Home Management : 8-22 mins G-Codes:     2017-09-18 Nestor Lewandowsky, OTR/L Pager: [phone]  Werner Lean Haze Boyden 09-18-17, 10:03 AM

## 2017-08-27 NOTE — Progress Notes (Signed)
After transferring patient to the bedside commode at 0000 08/27/17, noticed pt's bedpad was wet with urine from the foley catheter-hence the red seal is not intact; Tegaderm applied. Will continue to monitor.

## 2017-08-27 NOTE — Progress Notes (Signed)
Pt has voided in urinal a total of 60 cc 08/27/17. Bladder scan performed at this time. 145ml of fluid present.

## 2017-08-27 NOTE — Progress Notes (Signed)
Foley had continued to leak significantly and was removed at 0500 08/27/2017 and external catheter was placed to measure urine output. Pt has voided since foley removal. Will continue to monitor.

## 2017-08-27 NOTE — Progress Notes (Addendum)
Patient ID: Tim [name], male   DOB: [birth year], 83 y.o.   MRN: [id number] TCTS DAILY ICU PROGRESS NOTE                   [address]            [place] [address]          [phone]   3 Days Post-Op Procedure(s) (LRB): CORONARY ARTERY BYPASS GRAFTING times four using left internal mammary artery and bilateral saphenous vein, using endoscope. TEE (N/A) TRANSESOPHAGEAL ECHOCARDIOGRAM (TEE) (N/A)  Total Length of Stay:  LOS: 3 days   Subjective: Patient up to chair, feels well was,  Good respiratory effor, shortness of breath t Objective: Vital signs in last 24 hours: Temp:  [97.4 F (36.3 C)-98.3 F (36.8 C)] 98.1 F (36.7 C) (12/14 0811) Pulse Rate:  [75-109] 106 (12/14 1000) Cardiac Rhythm: Normal sinus rhythm (12/14 0800) Resp:  [19-31] 22 (12/14 1000) BP: (88-141)/(53-68) 100/53 (12/14 1000) SpO2:  [93 %-100 %] 96 % (12/14 1000)  Filed Weights   08/24/17 1415 08/25/17 0500 08/26/17 0500  Weight: 198 lb 11.2 oz (90.1 kg) 205 lb 0.4 oz (93 kg) 191 lb 9.3 oz (86.9 kg)    Weight change:    Hemodynamic parameters for last 24 hours:    Intake/Output from previous day: 12/13 0701 - 12/14 0700 In: 510 [I.V.:460; IV Piggyback:50] Out: 195 [Urine:195]  Intake/Output this shift: Total I/O In: 200 [P.O.:120; I.V.:80] Out: -   Current Meds: Scheduled Meds: . acetaminophen  1,000 mg Oral Q6H   Or  . acetaminophen (TYLENOL) oral liquid 160 mg/5 mL  1,000 mg Per Tube Q6H  . aspirin EC  325 mg Oral Daily   Or  . aspirin  324 mg Per Tube Daily  . azelastine  2 spray Each Nare BID  . bisacodyl  10 mg Oral Daily   Or  . bisacodyl  10 mg Rectal Daily  . Chlorhexidine Gluconate Cloth  6 each Topical Daily  . docusate sodium  200 mg Oral Daily  . enoxaparin (LOVENOX) injection  30 mg Subcutaneous Q24H  . finasteride  5 mg Oral Daily  . fluticasone furoate-vilanterol  1 puff Inhalation Daily  . insulin aspart  0-24 Units Subcutaneous Q4H  . mouth  rinse  15 mL Mouth Rinse BID  . metoprolol tartrate  12.5 mg Oral BID   Or  . metoprolol tartrate  12.5 mg Per Tube BID  . pantoprazole  40 mg Oral Daily  . sodium chloride flush  10-40 mL Intracatheter Q12H  . sodium chloride flush  3 mL Intravenous Q12H  . tamsulosin  0.4 mg Oral BID   Continuous Infusions: . sodium chloride 10 mL/hr at 08/24/17 2100  . sodium chloride    . sodium chloride Stopped (08/24/17 1500)  . dexmedetomidine (PRECEDEX) IV infusion Stopped (08/24/17 1849)  . lactated ringers Stopped (08/25/17 1200)  . lactated ringers 20 mL/hr at 08/27/17 0600  . nitroGLYCERIN Stopped (08/24/17 1823)  . phenylephrine (NEO-SYNEPHRINE) Adult infusion Stopped (08/25/17 1200)   PRN Meds:.sodium chloride, levalbuterol, metoprolol tartrate, morphine injection, ondansetron (ZOFRAN) IV, oxyCODONE, sodium chloride flush, sodium chloride flush, traMADol  General appearance: alert and cooperative Neurologic: intact Heart: regular rate and rhythm, S1, S2 normal, no murmur, click, rub or gallop Lungs: clear to auscultation bilaterally Abdomen: soft, non-tender; bowel sounds normal; no masses,  no organomegaly Extremities: extremities normal, atraumatic, no cyanosis or edema and Homans sign is negative, no sign  of DVT Wound: Sternum stable dressing intact  Lab Results: CBC: Recent Labs    08/26/17 0434 08/27/17 0430  WBC 20.6* 15.0*  HGB 10.5* 9.3*  HCT 32.3* 28.3*  PLT 173 156   BMET:  Recent Labs    08/26/17 0434 08/27/17 0430  NA 136 135  K 4.3 4.2  CL 105 106  CO2 22 24  GLUCOSE 138* 128*  BUN 25* 37*  CREATININE 1.61* 1.81*  CALCIUM 8.3* 8.1*    CMET: Lab Results  Component Value Date   WBC 15.0 (H) 08/27/2017   HGB 9.3 (L) 08/27/2017   HCT 28.3 (L) 08/27/2017   PLT 156 08/27/2017   GLUCOSE 128 (H) 08/27/2017   ALT 18 08/20/2017   AST 13 (L) 08/20/2017   NA 135 08/27/2017   K 4.2 08/27/2017   CL 106 08/27/2017   CREATININE 1.81 (H) 08/27/2017   BUN  37 (H) 08/27/2017   CO2 24 08/27/2017   TSH 1.10 10/31/2012   INR 1.39 08/24/2017   HGBA1C 6.4 (H) 08/20/2017      PT/INR:  Recent Labs    08/24/17 1401  LABPROT 17.0*  INR 1.39   Radiology: Dg Chest Port 1 View  Result Date: 08/27/2017 CLINICAL DATA:  Recent chest tube removal EXAM: PORTABLE CHEST 1 VIEW COMPARISON:  08/26/2017 FINDINGS: Cardiac shadow remains enlarged. Postsurgical changes are again seen. Right jugular sheath is again noted and stable. Bibasilar atelectatic changes are seen with small effusions. Previously noted left thoracostomy catheter has been removed without evidence of recurrent pneumothorax. IMPRESSION: Bibasilar atelectasis with small effusions. Electronically Signed   By: Inez Catalina M.D.   On: 08/27/2017 07:12   Acute Kidney Injury (any one)  Increase in SCr by > 0.3 within 48 hours  Increase SCr to > 1.5 times baseline  Urine volume < 0.5 ml/kg/h for 6 hrs  ?Stage 1 - Increase in serum creatinine to 1.5 to 1.9 times baseline, or increase in serum creatinine by ?0.3 mg/dL (?26.5 micromol/L), or reduction in urine output to <0.5 mL/kg per hour for 6 to 12 hours.  ?Stage 2 - Increase in serum creatinine to 2.0 to 2.9 times baseline, or reduction in urine output to <0.5 mL/kg per hour for ?12 hours.  ?Stage 3 - Increase in serum creatinine to 3.0 times baseline, or increase in serum creatinine to ?4.0 mg/dL (?353.6 micromol/L), or reduction in urine output to <0.3 mL/kg per hour for ?24 hours, or anuria for ?12 hours, or the initiation of renal replacement therapy, or, in patients <18 years, decrease in eGFR to <35 mL   Lab Results  Component Value Date   CREATININE 1.81 (H) 08/27/2017   Estimated Creatinine Clearance: 31.2 mL/min (A) (by C-G formula based on SCr of 1.81 mg/dL (H)).    Assessment/Plan: S/P Procedure(s) (LRB): CORONARY ARTERY BYPASS GRAFTING times four using left internal mammary artery and bilateral saphenous vein, using  endoscope. TEE (N/A) TRANSESOPHAGEAL ECHOCARDIOGRAM (TEE) (N/A) Foley out today  Monitor renal function carefully  In unit t On flomax as was preop    Tim [name] 08/27/2017 10:16 AM

## 2017-08-27 NOTE — Care Management Note (Addendum)
Case Management Note  Patient Details  Name: ANSEL FERRALL MRN: [id number] Date of Birth: 20-Apr-1929  Subjective/Objective:    From home with son and daughter n law, son works 3rd shift and daughter in Sports coach just works 3 days during the week so he will have someone with him at all times, POD 3 CABG, Per pt /ot eval rec HHPT/HHOT,gave patient the [facility] agency list, he chose [facility], referral made for HHPT,HHOT.  They state start of care will be on  on Wed 12/19 to see patient.  Patient states he has a bsc and a rolling walker at home.  NCM faxed orders to [facility].                 Action/Plan: DC home when stable with [place] services with [facility], will need HHPT, HHOT orders with face to face.   Expected Discharge Date:                  Expected Discharge Plan:  [place]  In-House Referral:     Discharge planning Services  CM Consult  Post Acute Care Choice:    Choice offered to:     DME Arranged:    DME Agency:     HH Arranged:    [place] Agency:     Status of Service:  In process, will continue to follow  If discussed at Long Length of Stay Meetings, dates discussed:    Additional Comments:  Zenon Mayo, RN 08/27/2017, 11:25 AM

## 2017-08-28 ENCOUNTER — Inpatient Hospital Stay (HOSPITAL_COMMUNITY): Payer: Medicare Other

## 2017-08-28 LAB — CBC
HCT: 28 % — ABNORMAL LOW (ref 39.0–52.0)
Hemoglobin: 9.1 g/dL — ABNORMAL LOW (ref 13.0–17.0)
MCH: 28.9 pg (ref 26.0–34.0)
MCHC: 32.5 g/dL (ref 30.0–36.0)
MCV: 88.9 fL (ref 78.0–100.0)
Platelets: 188 10*3/uL (ref 150–400)
RBC: 3.15 MIL/uL — ABNORMAL LOW (ref 4.22–5.81)
RDW: 15.1 % (ref 11.5–15.5)
WBC: 12.1 10*3/uL — ABNORMAL HIGH (ref 4.0–10.5)

## 2017-08-28 LAB — BASIC METABOLIC PANEL
Anion gap: 9 (ref 5–15)
BUN: 43 mg/dL — ABNORMAL HIGH (ref 6–20)
CO2: 22 mmol/L (ref 22–32)
Calcium: 8 mg/dL — ABNORMAL LOW (ref 8.9–10.3)
Chloride: 104 mmol/L (ref 101–111)
Creatinine, Ser: 1.73 mg/dL — ABNORMAL HIGH (ref 0.61–1.24)
GFR calc Af Amer: 39 mL/min — ABNORMAL LOW (ref >60)
GFR calc non Af Amer: 34 mL/min — ABNORMAL LOW (ref >60)
Glucose, Bld: 122 mg/dL — ABNORMAL HIGH (ref 65–99)
Potassium: 4 mmol/L (ref 3.5–5.1)
Sodium: 135 mmol/L (ref 135–145)

## 2017-08-28 LAB — GLUCOSE, CAPILLARY
Glucose-Capillary: 111 mg/dL — ABNORMAL HIGH (ref 65–99)
Glucose-Capillary: 141 mg/dL — ABNORMAL HIGH (ref 65–99)
Glucose-Capillary: 174 mg/dL — ABNORMAL HIGH (ref 65–99)
Glucose-Capillary: 148 mg/dL — ABNORMAL HIGH (ref 65–99)
Glucose-Capillary: 163 mg/dL — ABNORMAL HIGH (ref 65–99)

## 2017-08-28 MED ORDER — METOPROLOL TARTRATE 25 MG PO TABS
25.0000 mg | ORAL_TABLET | Freq: Two times a day (BID) | ORAL | Status: DC
  Administered 2017-08-28 – 2017-08-29 (×3): 25 mg via ORAL
  Filled 2017-08-28 (×4): qty 1

## 2017-08-28 MED ORDER — LEVALBUTEROL HCL 0.63 MG/3ML IN NEBU
0.6300 mg | INHALATION_SOLUTION | RESPIRATORY_TRACT | Status: DC | PRN
  Administered 2017-08-28 – 2017-08-31 (×2): 0.63 mg via RESPIRATORY_TRACT
  Filled 2017-08-28: qty 3

## 2017-08-28 MED ORDER — DILTIAZEM HCL 100 MG IV SOLR
5.0000 mg/h | INTRAVENOUS | Status: DC
  Administered 2017-08-28: 5 mg/h via INTRAVENOUS
  Filled 2017-08-28: qty 100

## 2017-08-28 MED ORDER — AMIODARONE HCL IN DEXTROSE 360-4.14 MG/200ML-% IV SOLN
60.0000 mg/h | INTRAVENOUS | Status: AC
  Administered 2017-08-28: 60 mg/h via INTRAVENOUS
  Filled 2017-08-28: qty 200

## 2017-08-28 MED ORDER — FUROSEMIDE 10 MG/ML IJ SOLN
INTRAMUSCULAR | Status: AC
  Filled 2017-08-28: qty 4

## 2017-08-28 MED ORDER — AMIODARONE HCL IN DEXTROSE 360-4.14 MG/200ML-% IV SOLN
30.0000 mg/h | INTRAVENOUS | Status: DC
  Administered 2017-08-28 – 2017-08-29 (×3): 30 mg/h via INTRAVENOUS
  Filled 2017-08-28 (×3): qty 200

## 2017-08-28 MED ORDER — ALBUMIN HUMAN 5 % IV SOLN
12.5000 g | Freq: Once | INTRAVENOUS | Status: AC
  Administered 2017-08-28: 12.5 g via INTRAVENOUS

## 2017-08-28 MED ORDER — FUROSEMIDE 10 MG/ML IJ SOLN
40.0000 mg | Freq: Once | INTRAMUSCULAR | Status: AC
  Administered 2017-08-28: 40 mg via INTRAVENOUS

## 2017-08-28 MED ORDER — ALBUMIN HUMAN 5 % IV SOLN
INTRAVENOUS | Status: AC
  Administered 2017-08-28: 12.5 g via INTRAVENOUS
  Filled 2017-08-28: qty 250

## 2017-08-28 MED ORDER — AMIODARONE LOAD VIA INFUSION
150.0000 mg | Freq: Once | INTRAVENOUS | Status: AC
  Administered 2017-08-28: 150 mg via INTRAVENOUS
  Filled 2017-08-28: qty 83.34

## 2017-08-28 MED ORDER — METOPROLOL TARTRATE 25 MG/10 ML ORAL SUSPENSION: 25.0000 mg | Freq: Two times a day (BID) | ORAL | Status: DC

## 2017-08-28 MED ORDER — DILTIAZEM HCL 100 MG IV SOLR
5.0000 mg/h | INTRAVENOUS | Status: DC
  Administered 2017-08-28: 8 mg/h via INTRAVENOUS
  Filled 2017-08-28 (×3): qty 100

## 2017-08-28 MED ORDER — GLUCERNA SHAKE PO LIQD
237.0000 mL | Freq: Three times a day (TID) | ORAL | Status: DC
  Administered 2017-08-28 – 2017-09-03 (×7): 237 mL via ORAL
  Filled 2017-08-28: qty 237

## 2017-08-28 MED ORDER — AMIODARONE HCL IN DEXTROSE 360-4.14 MG/200ML-% IV SOLN
INTRAVENOUS | Status: AC
  Administered 2017-08-28: 150 mg via INTRAVENOUS
  Filled 2017-08-28: qty 200

## 2017-08-28 MED ORDER — AMIODARONE LOAD VIA INFUSION
150.0000 mg | Freq: Once | INTRAVENOUS | Status: AC
Start: 2017-08-28 — End: 2017-08-28
  Administered 2017-08-28: 150 mg via INTRAVENOUS
  Filled 2017-08-28: qty 83.34

## 2017-08-28 NOTE — Progress Notes (Signed)
Dr. Prescott Gum notified of pt's HR sustaining 130s-150s. BP remains stable. Orders received to give 150mg  bolus of Amiodarone and okay to titrate Cardizem.

## 2017-08-28 NOTE — Progress Notes (Signed)
Pt has flipped back in to SR at this time.  HR maintaining 80s. Strip printed for chart. Will continue to monitor.

## 2017-08-28 NOTE — Progress Notes (Addendum)
Pt went into new-onset Afib w/RVR rate 130s-160s at 01:40. IV metoprolol 5mg  given, rate unchanged. BP soft 80s-90s/60s (MAP 60s-70s). Pt reports discomfort, anxiety with onset of dysrhythmia.  MD paged at 01:55. Awaiting callback.   Addendum: MD called back at 02:00, gave verbal orders for Amiodarone infusion w/150mg  bolus (over 70min) and 1 albumin to help with BP. Said could start Neo at low rate if unable to maintain pressures.  Will administer and continue to monitor closely.

## 2017-08-28 NOTE — Progress Notes (Addendum)
TCTS DAILY ICU PROGRESS NOTE                   [address]            [place] [address]          [phone]   4 Days Post-Op Procedure(s) (LRB): CORONARY ARTERY BYPASS GRAFTING times four using left internal mammary artery and bilateral saphenous vein, using endoscope. TEE (N/A) TRANSESOPHAGEAL ECHOCARDIOGRAM (TEE) (N/A)  Total Length of Stay:  LOS: 4 days   Subjective: Patient with complaints of difficulty swallowing and he did not sleep well.  Objective: Vital signs in last 24 hours: Temp:  [97.4 F (36.3 C)-98.6 F (37 C)] 97.4 F (36.3 C) (12/15 1200) Pulse Rate:  [38-146] 82 (12/15 0900) Cardiac Rhythm: Atrial fibrillation (12/15 0730) Resp:  [19-34] 31 (12/15 0900) BP: (78-129)/(49-86) 114/86 (12/15 0900) SpO2:  [88 %-100 %] 97 % (12/15 0921) Weight:  [205 lb 4 oz (93.1 kg)] 205 lb 4 oz (93.1 kg) (12/15 0500)  Filed Weights   08/25/17 0500 08/26/17 0500 08/28/17 0500  Weight: 205 lb 0.4 oz (93 kg) 191 lb 9.3 oz (86.9 kg) 205 lb 4 oz (93.1 kg)      Intake/Output from previous day: 12/14 0701 - 12/15 0700 In: 987.7 [P.O.:360; I.V.:377.7; IV Piggyback:250] Out: 435 [Urine:435]  Intake/Output this shift: Total I/O In: -  Out: 100 [Urine:100]  Current Meds: Scheduled Meds: . acetaminophen  1,000 mg Oral Q6H   Or  . acetaminophen (TYLENOL) oral liquid 160 mg/5 mL  1,000 mg Per Tube Q6H  . aspirin EC  325 mg Oral Daily   Or  . aspirin  324 mg Per Tube Daily  . azelastine  2 spray Each Nare BID  . bisacodyl  10 mg Oral Daily   Or  . bisacodyl  10 mg Rectal Daily  . Chlorhexidine Gluconate Cloth  6 each Topical Daily  . docusate sodium  200 mg Oral Daily  . enoxaparin (LOVENOX) injection  30 mg Subcutaneous Q24H  . finasteride  5 mg Oral Daily  . fluticasone furoate-vilanterol  1 puff Inhalation Daily  . insulin aspart  0-24 Units Subcutaneous Q4H  . mouth rinse  15 mL Mouth Rinse BID  . metoprolol tartrate  12.5 mg Oral BID   Or  .  metoprolol tartrate  12.5 mg Per Tube BID  . pantoprazole  40 mg Oral Daily  . sodium chloride flush  10-40 mL Intracatheter Q12H  . sodium chloride flush  3 mL Intravenous Q12H  . tamsulosin  0.4 mg Oral BID   Continuous Infusions: . sodium chloride 10 mL/hr at 08/24/17 2100  . sodium chloride    . sodium chloride Stopped (08/24/17 1500)  . amiodarone 30 mg/hr (08/28/17 1042)  . dexmedetomidine (PRECEDEX) IV infusion Stopped (08/24/17 1849)  . diltiazem (CARDIZEM) infusion 10 mg/hr (08/28/17 1052)  . lactated ringers Stopped (08/25/17 1200)  . lactated ringers Stopped (08/27/17 1203)  . nitroGLYCERIN Stopped (08/24/17 1823)  . phenylephrine (NEO-SYNEPHRINE) Adult infusion Stopped (08/25/17 1200)   PRN Meds:.sodium chloride, levalbuterol, metoprolol tartrate, morphine injection, ondansetron (ZOFRAN) IV, oxyCODONE, sodium chloride flush, sodium chloride flush, traMADol  General appearance: alert, cooperative and no distress Neurologic: intact Heart: IRRR IRRR  Lungs: Decreased at bases bilaterally Abdomen: Soft, non tender, bowel sounds present Extremities: Mild LE edema Wound: Sternal wound is clean and dry. LE wounds are clean and dry.  Lab Results: CBC: Recent Labs    08/27/17 0430  08/28/17 0200  WBC 15.0* 12.1*  HGB 9.3* 9.1*  HCT 28.3* 28.0*  PLT 156 188   BMET:  Recent Labs    08/27/17 1741 08/28/17 0200  NA 134* 135  K 4.1 4.0  CL 105 104  CO2 22 22  GLUCOSE 134* 122*  BUN 41* 43*  CREATININE 1.81* 1.73*  CALCIUM 8.0* 8.0*    CMET: Lab Results  Component Value Date   WBC 12.1 (H) 08/28/2017   HGB 9.1 (L) 08/28/2017   HCT 28.0 (L) 08/28/2017   PLT 188 08/28/2017   GLUCOSE 122 (H) 08/28/2017   ALT 18 08/20/2017   AST 13 (L) 08/20/2017   NA 135 08/28/2017   K 4.0 08/28/2017   CL 104 08/28/2017   CREATININE 1.73 (H) 08/28/2017   BUN 43 (H) 08/28/2017   CO2 22 08/28/2017   TSH 1.10 10/31/2012   INR 1.39 08/24/2017   HGBA1C 6.4 (H) 08/20/2017       PT/INR:  No results for input(s): LABPROT, INR in the last 72 hours. Radiology: Dg Chest Port 1 View  Result Date: 08/28/2017 CLINICAL DATA:  Follow-up chest tube EXAM: PORTABLE CHEST 1 VIEW COMPARISON:  08/27/2017 FINDINGS: Cardiac shadow is enlarged. Postsurgical changes are again seen and stable. Right jugular sheath is again noted. Mild bibasilar atelectasis and effusions are again seen and stable. No new focal abnormality is noted. IMPRESSION: Stable bibasilar changes. Electronically Signed   By: Inez Catalina M.D.   On: 08/28/2017 07:11     Assessment/Plan: S/P Procedure(s) (LRB): CORONARY ARTERY BYPASS GRAFTING times four using left internal mammary artery and bilateral saphenous vein, using endoscope. TEE (N/A) TRANSESOPHAGEAL ECHOCARDIOGRAM (TEE) (N/A)  1. CV-Atrial fibrillation with HR 120's. On Amiodarone and Cardizem drips as well as Lopressor 12.5 mg bid. Will increase Lopressor. As discussed with Dr. Prescott Gum, continue drips and IV Lopressor for now.  2. Pulmonary-Has COPD, ? history of elevated right hemidaphragm. On 2 liters of oxygen via [place]. Will work on weaning over next several days. CXR this am shows bilateral pleural effusions and ateletctasis. Continue Xopenex and inhaler.Encourage incentive spirometer and flutter valve. 3. Creatinine slightly decreased to 1.73. Check in am 4. ABL anemia-H and H stable at 9.1 and 33.2 5.  CBGs 111/141/174. On Insulin drip. Pre op HGA1C 6.4. He likely has pre diabetes. Will not schedule Insulin but will give PRN to avoid hypoglycemia as weaned off drip.  7. BPH-on Proscar and Flomax 8. Statin intolerant so not on-will defer to cardiology after discharge  9. GI- speech evaluation last done 12/14. Recommendations being followed accordingly. Will give Glucerna as not eating much.   Donielle Liston Alba PA-C 08/28/2017 1:04 PM   patient examined and medical record reviewed,agree with above note. Tharon Aquas Trigt  III 08/29/2017

## 2017-08-28 NOTE — Progress Notes (Signed)
Notified Dr. Prescott Gum of Afib rate uncontrolled -- 130s-150s. BP stable. Pt still feels uncomfortable/anxious. No c/o pain.  Given verbal order to start Cardizem gtt at 5mg /hr untitrated.  Will administer and continue to monitor closely.

## 2017-08-28 NOTE — Progress Notes (Signed)
Pt complains of increasing shortness of breath. RR 25 to 35, even at rest. Breath sounds diminished, with congested cough. MD Nils Pyle made aware, new orders for IV lasix 40 mg one time given and administered. RRT made aware as well and breathing treatment also given. Will continue to monitor closely.

## 2017-08-28 NOTE — Progress Notes (Addendum)
EKG completed to evaluate rhythm. EKG shown to [facility].

## 2017-08-29 ENCOUNTER — Inpatient Hospital Stay (HOSPITAL_COMMUNITY): Payer: Medicare Other

## 2017-08-29 LAB — BASIC METABOLIC PANEL
Anion gap: 9 (ref 5–15)
BUN: 45 mg/dL — ABNORMAL HIGH (ref 6–20)
CO2: 24 mmol/L (ref 22–32)
Calcium: 7.8 mg/dL — ABNORMAL LOW (ref 8.9–10.3)
Chloride: 104 mmol/L (ref 101–111)
Creatinine, Ser: 1.52 mg/dL — ABNORMAL HIGH (ref 0.61–1.24)
GFR calc Af Amer: 46 mL/min — ABNORMAL LOW (ref >60)
GFR calc non Af Amer: 40 mL/min — ABNORMAL LOW (ref >60)
Glucose, Bld: 139 mg/dL — ABNORMAL HIGH (ref 65–99)
Potassium: 3.7 mmol/L (ref 3.5–5.1)
Sodium: 137 mmol/L (ref 135–145)

## 2017-08-29 LAB — GLUCOSE, CAPILLARY
Glucose-Capillary: 108 mg/dL — ABNORMAL HIGH (ref 65–99)
Glucose-Capillary: 128 mg/dL — ABNORMAL HIGH (ref 65–99)
Glucose-Capillary: 137 mg/dL — ABNORMAL HIGH (ref 65–99)
Glucose-Capillary: 142 mg/dL — ABNORMAL HIGH (ref 65–99)
Glucose-Capillary: 150 mg/dL — ABNORMAL HIGH (ref 65–99)
Glucose-Capillary: 152 mg/dL — ABNORMAL HIGH (ref 65–99)
Glucose-Capillary: 98 mg/dL (ref 65–99)

## 2017-08-29 LAB — CBC
HCT: 26.4 % — ABNORMAL LOW (ref 39.0–52.0)
Hemoglobin: 8.5 g/dL — ABNORMAL LOW (ref 13.0–17.0)
MCH: 28.3 pg (ref 26.0–34.0)
MCHC: 32.2 g/dL (ref 30.0–36.0)
MCV: 88 fL (ref 78.0–100.0)
Platelets: 213 10*3/uL (ref 150–400)
RBC: 3 MIL/uL — ABNORMAL LOW (ref 4.22–5.81)
RDW: 15.4 % (ref 11.5–15.5)
WBC: 6.9 10*3/uL (ref 4.0–10.5)

## 2017-08-29 MED ORDER — DILTIAZEM HCL-DEXTROSE 100-5 MG/100ML-% IV SOLN (PREMIX)
5.0000 mg/h | INTRAVENOUS | Status: DC
  Administered 2017-08-29: 6 mg/h via INTRAVENOUS

## 2017-08-29 MED ORDER — GUAIFENESIN ER 600 MG PO TB12
600.0000 mg | ORAL_TABLET | Freq: Two times a day (BID) | ORAL | Status: DC
  Administered 2017-08-29 – 2017-09-03 (×11): 600 mg via ORAL
  Filled 2017-08-29 (×11): qty 1

## 2017-08-29 MED ORDER — AMIODARONE HCL 200 MG PO TABS
200.0000 mg | ORAL_TABLET | Freq: Two times a day (BID) | ORAL | Status: DC
  Administered 2017-08-29 – 2017-09-03 (×10): 200 mg via ORAL
  Filled 2017-08-29 (×10): qty 1

## 2017-08-29 MED ORDER — FUROSEMIDE 10 MG/ML IJ SOLN
40.0000 mg | Freq: Once | INTRAMUSCULAR | Status: AC
  Administered 2017-08-29: 40 mg via INTRAVENOUS
  Filled 2017-08-29: qty 4

## 2017-08-29 MED ORDER — ALUM & MAG HYDROXIDE-SIMETH 200-200-20 MG/5ML PO SUSP
15.0000 mL | ORAL | Status: DC | PRN
  Administered 2017-08-29: 15 mL via ORAL
  Filled 2017-08-29: qty 30

## 2017-08-29 MED ORDER — BISMUTH SUBSALICYLATE 262 MG PO CHEW
524.0000 mg | CHEWABLE_TABLET | ORAL | Status: DC | PRN
Start: 2017-08-29 — End: 2017-09-04
  Filled 2017-08-29: qty 2

## 2017-08-29 NOTE — Progress Notes (Signed)
5 Days Post-Op Procedure(s) (LRB): CORONARY ARTERY BYPASS GRAFTING times four using left internal mammary artery and bilateral saphenous vein, using endoscope. TEE (N/A) TRANSESOPHAGEAL ECHOCARDIOGRAM (TEE) (N/A) Subjective: Maintaining nsr Creat better cxr wet  Objective: Vital signs in last 24 hours: Temp:  [97.4 F (36.3 C)-98.9 F (37.2 C)] 97.4 F (36.3 C) (12/16 0752) Pulse Rate:  [63-138] 85 (12/16 0600) Cardiac Rhythm: Normal sinus rhythm (12/16 0400) Resp:  [16-34] 16 (12/16 0600) BP: (105-170)/(53-104) 115/59 (12/16 0600) SpO2:  [92 %-100 %] 97 % (12/16 0953) Weight:  [204 lb (92.5 kg)] 204 lb (92.5 kg) (12/16 0500)  Hemodynamic parameters for last 24 hours:    Intake/Output from previous day: 12/15 0701 - 12/16 0700 In: 1604.3 [P.O.:720; I.V.:884.3] Out: 950 [Urine:950] Intake/Output this shift: No intake/output data recorded.       Exam    General- alert and comfortable   Lungs- clear without rales, wheezes   Cor- regular rate and rhythm, no murmur , gallop   Abdomen- soft, non-tender   Extremities - warm, non-tender, minimal edema   Neuro- oriented, appropriate, no focal weakness   Lab Results: Recent Labs    08/28/17 0200 08/29/17 0400  WBC 12.1* 6.9  HGB 9.1* 8.5*  HCT 28.0* 26.4*  PLT 188 213   BMET:  Recent Labs    08/28/17 0200 08/29/17 0400  NA 135 137  K 4.0 3.7  CL 104 104  CO2 22 24  GLUCOSE 122* 139*  BUN 43* 45*  CREATININE 1.73* 1.52*  CALCIUM 8.0* 7.8*    PT/INR: No results for input(s): LABPROT, INR in the last 72 hours. ABG    Component Value Date/Time   PHART 7.329 (L) 08/24/2017 1908   HCO3 20.3 08/24/2017 1908   TCO2 22 08/24/2017 1952   ACIDBASEDEF 5.0 (H) 08/24/2017 1908   O2SAT 93.0 08/24/2017 1908   CBG (last 3)  Recent Labs    08/29/17 0003 08/29/17 0428 08/29/17 0749  GLUCAP 150* 128* 142*    Assessment/Plan: S/P Procedure(s) (LRB): CORONARY ARTERY BYPASS GRAFTING times four using left internal  mammary artery and bilateral saphenous vein, using endoscope. TEE (N/A) TRANSESOPHAGEAL ECHOCARDIOGRAM (TEE) (N/A) Mobilize Diuresis wean iv cardizem Cont iv amio until later  LOS: 5 days    Tharon Aquas Trigt III 08/29/2017

## 2017-08-29 NOTE — Plan of Care (Signed)
Patient progressing towards self-maintenance and optimum health with treatment of A-fib and resulting symptoms.  A-Fib now resolved.  Will monitor progress closely and increase activity accordingly; moving towards independence.

## 2017-08-30 ENCOUNTER — Inpatient Hospital Stay (HOSPITAL_COMMUNITY): Payer: Medicare Other

## 2017-08-30 LAB — BASIC METABOLIC PANEL
Anion gap: 9 (ref 5–15)
BUN: 39 mg/dL — ABNORMAL HIGH (ref 6–20)
CO2: 24 mmol/L (ref 22–32)
Calcium: 7.9 mg/dL — ABNORMAL LOW (ref 8.9–10.3)
Chloride: 104 mmol/L (ref 101–111)
Creatinine, Ser: 1.42 mg/dL — ABNORMAL HIGH (ref 0.61–1.24)
GFR calc Af Amer: 50 mL/min — ABNORMAL LOW (ref >60)
GFR calc non Af Amer: 43 mL/min — ABNORMAL LOW (ref >60)
Glucose, Bld: 124 mg/dL — ABNORMAL HIGH (ref 65–99)
Potassium: 3.7 mmol/L (ref 3.5–5.1)
Sodium: 137 mmol/L (ref 135–145)

## 2017-08-30 LAB — CBC
HCT: 26.8 % — ABNORMAL LOW (ref 39.0–52.0)
Hemoglobin: 8.7 g/dL — ABNORMAL LOW (ref 13.0–17.0)
MCH: 28.4 pg (ref 26.0–34.0)
MCHC: 32.5 g/dL (ref 30.0–36.0)
MCV: 87.6 fL (ref 78.0–100.0)
Platelets: 230 10*3/uL (ref 150–400)
RBC: 3.06 MIL/uL — ABNORMAL LOW (ref 4.22–5.81)
RDW: 15.4 % (ref 11.5–15.5)
WBC: 6.9 10*3/uL (ref 4.0–10.5)

## 2017-08-30 LAB — GLUCOSE, CAPILLARY
Glucose-Capillary: 112 mg/dL — ABNORMAL HIGH (ref 65–99)
Glucose-Capillary: 115 mg/dL — ABNORMAL HIGH (ref 65–99)
Glucose-Capillary: 126 mg/dL — ABNORMAL HIGH (ref 65–99)
Glucose-Capillary: 115 mg/dL — ABNORMAL HIGH (ref 65–99)
Glucose-Capillary: 137 mg/dL — ABNORMAL HIGH (ref 65–99)

## 2017-08-30 MED ORDER — SODIUM CHLORIDE 0.9 % IV SOLN: 250.0000 mL | INTRAVENOUS | Status: DC | PRN

## 2017-08-30 MED ORDER — ASPIRIN EC 81 MG PO TBEC
81.0000 mg | DELAYED_RELEASE_TABLET | Freq: Every day | ORAL | Status: DC
  Administered 2017-08-30 – 2017-09-03 (×5): 81 mg via ORAL
  Filled 2017-08-30 (×4): qty 1

## 2017-08-30 MED ORDER — POTASSIUM CHLORIDE 10 MEQ/50ML IV SOLN
10.0000 meq | INTRAVENOUS | Status: DC
  Administered 2017-08-30 (×3): 10 meq via INTRAVENOUS
  Filled 2017-08-30 (×3): qty 50

## 2017-08-30 MED ORDER — BISACODYL 5 MG PO TBEC
10.0000 mg | DELAYED_RELEASE_TABLET | Freq: Every day | ORAL | Status: DC | PRN
  Administered 2017-08-30: 10 mg via ORAL

## 2017-08-30 MED ORDER — ATENOLOL 25 MG PO TABS
50.0000 mg | ORAL_TABLET | Freq: Every day | ORAL | Status: DC
  Administered 2017-08-30: 50 mg via ORAL
  Filled 2017-08-30: qty 2

## 2017-08-30 MED ORDER — MOVING RIGHT ALONG BOOK
Freq: Once | Status: AC
  Administered 2017-08-30: 10:00:00
  Filled 2017-08-30: qty 1

## 2017-08-30 MED ORDER — POTASSIUM CHLORIDE CRYS ER 20 MEQ PO TBCR
20.0000 meq | EXTENDED_RELEASE_TABLET | Freq: Every day | ORAL | Status: DC
  Administered 2017-08-30 – 2017-09-03 (×5): 20 meq via ORAL
  Filled 2017-08-30 (×5): qty 1

## 2017-08-30 MED ORDER — SODIUM CHLORIDE 0.9% FLUSH
3.0000 mL | Freq: Two times a day (BID) | INTRAVENOUS | Status: DC
  Administered 2017-08-30 – 2017-09-03 (×9): 3 mL via INTRAVENOUS

## 2017-08-30 MED ORDER — INSULIN ASPART 100 UNIT/ML ~~LOC~~ SOLN
0.0000 [IU] | Freq: Three times a day (TID) | SUBCUTANEOUS | Status: DC
  Administered 2017-08-30 (×2): 2 [IU] via SUBCUTANEOUS

## 2017-08-30 MED ORDER — SODIUM CHLORIDE 0.9% FLUSH: 3.0000 mL | INTRAVENOUS | Status: DC | PRN

## 2017-08-30 MED ORDER — BISACODYL 10 MG RE SUPP
10.0000 mg | Freq: Every day | RECTAL | Status: DC | PRN
Start: 2017-08-30 — End: 2017-09-04

## 2017-08-30 MED ORDER — POTASSIUM CHLORIDE 10 MEQ/50ML IV SOLN: 10.0000 meq | INTRAVENOUS | Status: DC | PRN

## 2017-08-30 MED ORDER — PANTOPRAZOLE SODIUM 40 MG PO TBEC
40.0000 mg | DELAYED_RELEASE_TABLET | Freq: Every day | ORAL | Status: DC
  Administered 2017-08-30 – 2017-09-03 (×5): 40 mg via ORAL
  Filled 2017-08-30 (×4): qty 1

## 2017-08-30 MED ORDER — ONDANSETRON HCL 4 MG PO TABS: 4.0000 mg | ORAL_TABLET | Freq: Four times a day (QID) | ORAL | Status: DC | PRN

## 2017-08-30 MED ORDER — FUROSEMIDE 40 MG PO TABS
40.0000 mg | ORAL_TABLET | Freq: Every day | ORAL | Status: DC
  Administered 2017-08-30 – 2017-09-03 (×5): 40 mg via ORAL
  Filled 2017-08-30 (×5): qty 1

## 2017-08-30 MED ORDER — ALUM & MAG HYDROXIDE-SIMETH 200-200-20 MG/5ML PO SUSP: 15.0000 mL | ORAL | Status: DC | PRN

## 2017-08-30 MED ORDER — ONDANSETRON HCL 4 MG/2ML IJ SOLN: 4.0000 mg | Freq: Four times a day (QID) | INTRAMUSCULAR | Status: DC | PRN

## 2017-08-30 MED ORDER — TRAMADOL HCL 50 MG PO TABS
50.0000 mg | ORAL_TABLET | Freq: Four times a day (QID) | ORAL | Status: DC | PRN
Start: 2017-08-30 — End: 2017-09-04
  Administered 2017-09-02: 50 mg via ORAL
  Filled 2017-08-30: qty 1

## 2017-08-30 NOTE — Progress Notes (Signed)
Came to ambulate and pt was in BR trying to have BM. Returned and pt back in bed (apparently got there independently?). Pt now too tired to walk, looks exhausted. Encouraged him to take and nap and then walk with PT later. I also encouraged more IS and flutter valve. Pt voiced understanding. Will f/u tomorrow. [place], ACSM 08/30/2017 2:39 PM [phone]

## 2017-08-30 NOTE — Progress Notes (Signed)
Physical Therapy Treatment Patient Details Name: Tim [name] MRN: [id number] DOB: September 07, 1929 Today's Date: 08/30/2017    History of Present Illness Pt is an 83 y.o. male s/p CABGx4 on 08/24/17. Pertinent PMH includes paralyzed hemidiaphragm, emphysema, CAD, AAA, asthma.     PT Comments    Pt resting in bed on arrival but agreeable to perform gait this afternoon.  Pt reports this is his 4th walk.  Attempted walk on RA but unable to obtain accurate pulse oximeter reading so returned to room after mild complaints of dizziness.  HR stable.  Replaced finger probe with prob on ear and O2 sats 100% on 2L post session.  Pt requesting to sit edge of bed with son present and denies dizziness in sitting.    Follow Up Recommendations  Home health PT;Supervision - Intermittent     Equipment Recommendations  Rolling walker with 5" wheels    Recommendations for Other Services OT consult     Precautions / Restrictions Precautions Precautions: Sternal;Fall Precaution Comments: Pt required re-eduation on sternal precautions and cues to maintain Restrictions Weight Bearing Restrictions: Yes(sternal precautions) Other Position/Activity Restrictions: Sternal    Mobility  Bed Mobility Overal bed mobility: Needs Assistance Bed Mobility: Rolling;Sidelying to Sit;Sit to Sidelying Rolling: Min guard Sidelying to sit: Min assist     Sit to sidelying: Min assist General bed mobility comments: Cues for hand placement to hold to heart pillow to maintain sternal precautions.  Instructed patient in rolling to improve ease, Pt required min assist to elevate trunk from sidelying position.    Transfers Overall transfer level: Needs assistance Equipment used: Rolling walker (2 wheeled) Transfers: Sit to/from Stand Sit to Stand: Min guard         General transfer comment: Cues for hand placement on knees and using rocking momentum to achieve standing.  Pt remains to present with good eccentric  load when returning to seated position.    Ambulation/Gait Ambulation/Gait assistance: Min guard;Min assist Ambulation Distance (Feet): 60 Feet Assistive device: Rolling walker (2 wheeled) Gait Pattern/deviations: Step-through pattern;Decreased stride length;Trunk flexed Gait velocity: Decreased Gait velocity interpretation: Below normal speed for age/gender General Gait Details: Min guard progressing to min.  Pt attempted to perform gait on RA but reported dizziness unable to obtain clear reading on monitor.  Replaced finger probe with ear placement of the probe and able to obtain reading greater than 90%.     Stairs            Wheelchair Mobility    Modified Rankin (Stroke Patients Only)       Balance Overall balance assessment: Needs assistance Sitting-balance support: No upper extremity supported;Feet supported Sitting balance-Leahy Scale: Good     Standing balance support: Bilateral upper extremity supported;No upper extremity supported Standing balance-Leahy Scale: Fair Standing balance comment: Can static stand with no UE support                            Cognition Arousal/Alertness: Awake/alert Behavior During Therapy: WFL for tasks assessed/performed Overall Cognitive Status: Within Functional Limits for tasks assessed                                        Exercises Other Exercises Other Exercises: Incentive Spirometer 1x10 reps 566ml-750ml quality.  Cues for correct technique and use of IS.      General Comments General  comments (skin integrity, edema, etc.): Pt without supplemental O2 on my arrival. However, noted desaturation to 70% with poor wave form and re-applied 2L O2 with rebound to >90% throughout the rest of session.       Pertinent Vitals/Pain Pain Assessment: 0-10 Pain Score: 3  Faces Pain Scale: Hurts little more Pain Location: Sternal incision Pain Descriptors / Indicators: Sore;Discomfort Pain  Intervention(s): Monitored during session;Repositioned    Home Living                      Prior Function            PT Goals (current goals can now be found in the care plan section) Acute Rehab PT Goals Patient Stated Goal: Get to feeling better Potential to Achieve Goals: Good Progress towards PT goals: Progressing toward goals    Frequency    Min 3X/week      PT Plan Current plan remains appropriate    Co-evaluation              AM-PAC PT "6 Clicks" Daily Activity  Outcome Measure  Difficulty turning over in bed (including adjusting bedclothes, sheets and blankets)?: None Difficulty moving from lying on back to sitting on the side of the bed? : Unable Difficulty sitting down on and standing up from a chair with arms (e.g., wheelchair, bedside commode, etc,.)?: Unable Help needed moving to and from a bed to chair (including a wheelchair)?: A Little Help needed walking in hospital room?: A Little Help needed climbing 3-5 steps with a railing? : A Little 6 Click Score: 15    End of Session Equipment Utilized During Treatment: Gait belt;Oxygen Activity Tolerance: Patient tolerated treatment well Patient left: in bed;with call bell/phone within reach;with nursing/sitter in room(Pt sitting edge of bed on arrival awaiting supper.  ) Nurse Communication: Mobility status PT Visit Diagnosis: Other abnormalities of gait and mobility (R26.89);Pain Pain - part of body: (sternum)     Time: 3007-6226 PT Time Calculation (min) (ACUTE ONLY): 16 min  Charges:  $Gait Training: 8-22 mins                    G Codes:       Governor Rooks, PTA pager [phone]    Cristela Blue 08/30/2017, 5:01 PM

## 2017-08-30 NOTE — Progress Notes (Signed)
Report given to [facility] RN at this time.

## 2017-08-30 NOTE — Progress Notes (Signed)
OT Cancellation Note  Patient Details Name: Tim [name] MRN: [id number] DOB: 01/26/1929   Cancelled Treatment:    Reason Eval/Treat Not Completed: Patient not medically ready. Pt on bedrest until 11:30 per RN. Will check back as able and appropriate.   Norman Herrlich, MS OTR/L  Pager: [name] 08/30/2017, 10:56 AM

## 2017-08-30 NOTE — Progress Notes (Signed)
Patient arrived to room from 2 heart. Patient is sitting up in chair. He is alert and oriented and VS are stable, b/p 120/98 heart rate 85 and resp. 23.  Patient incision site is clean dry and intact. Patient denies any pain.

## 2017-08-30 NOTE — Plan of Care (Signed)
Pt without need of indwelling catheter

## 2017-08-30 NOTE — Progress Notes (Signed)
Patient ID: IBN STIEF, male   DOB: 07-25-1929, 83 y.o.   MRN: [id number] TCTS DAILY ICU PROGRESS NOTE                   [address]            [place] [id number]          [phone]   6 Days Post-Op Procedure(s) (LRB): CORONARY ARTERY BYPASS GRAFTING times four using left internal mammary artery and bilateral saphenous vein, using endoscope. TEE (N/A) TRANSESOPHAGEAL ECHOCARDIOGRAM (TEE) (N/A)  Total Length of Stay:  LOS: 6 days   Subjective: Patient awake alert neurologically intact, remains in sinus rhythm  Objective: Vital signs in last 24 hours: Temp:  [97.5 F (36.4 C)-98.2 F (36.8 C)] 98 F (36.7 C) (12/17 0748) Pulse Rate:  [75-109] 90 (12/17 0748) Cardiac Rhythm: Normal sinus rhythm (12/17 0345) Resp:  [16-29] 23 (12/17 0748) BP: (106-137)/(56-78) 108/64 (12/17 0748) SpO2:  [73 %-98 %] 98 % (12/17 0748) Weight:  [205 lb 14.6 oz (93.4 kg)] 205 lb 14.6 oz (93.4 kg) (12/17 0500)  Filed Weights   08/28/17 0500 08/29/17 0500 08/30/17 0500  Weight: 205 lb 4 oz (93.1 kg) 204 lb (92.5 kg) 205 lb 14.6 oz (93.4 kg)    Weight change: 1 lb 14.6 oz (0.866 kg)   Hemodynamic parameters for last 24 hours:    Intake/Output from previous day: 12/16 0701 - 12/17 0700 In: 1611.2 [P.O.:1320; I.V.:241.2; IV Piggyback:50] Out: 1450 [Urine:1450]  Intake/Output this shift: No intake/output data recorded.  Current Meds: Scheduled Meds: . amiodarone  200 mg Oral BID  . aspirin EC  325 mg Oral Daily   Or  . aspirin  324 mg Per Tube Daily  . azelastine  2 spray Each Nare BID  . bisacodyl  10 mg Oral Daily   Or  . bisacodyl  10 mg Rectal Daily  . Chlorhexidine Gluconate Cloth  6 each Topical Daily  . docusate sodium  200 mg Oral Daily  . enoxaparin (LOVENOX) injection  30 mg Subcutaneous Q24H  . feeding supplement (GLUCERNA SHAKE)  237 mL Oral TID WC  . finasteride  5 mg Oral Daily  . fluticasone furoate-vilanterol  1 puff Inhalation Daily  .  guaiFENesin  600 mg Oral BID  . insulin aspart  0-24 Units Subcutaneous Q4H  . mouth rinse  15 mL Mouth Rinse BID  . metoprolol tartrate  25 mg Oral BID   Or  . metoprolol tartrate  25 mg Per Tube BID  . pantoprazole  40 mg Oral Daily  . sodium chloride flush  10-40 mL Intracatheter Q12H  . sodium chloride flush  3 mL Intravenous Q12H  . tamsulosin  0.4 mg Oral BID   Continuous Infusions: . sodium chloride 10 mL/hr (08/29/17 0825)  . dexmedetomidine (PRECEDEX) IV infusion Stopped (08/24/17 1849)  . phenylephrine (NEO-SYNEPHRINE) Adult infusion Stopped (08/25/17 1200)  . potassium chloride Stopped (08/30/17 0713)   PRN Meds:.sodium chloride, alum & mag hydroxide-simeth, bismuth subsalicylate, levalbuterol, metoprolol tartrate, ondansetron (ZOFRAN) IV, oxyCODONE, sodium chloride flush, sodium chloride flush, traMADol  General appearance: alert, cooperative and no distress Neurologic: intact Heart: regular rate and rhythm, S1, S2 normal, no murmur, click, rub or gallop Lungs: diminished breath sounds bibasilar Abdomen: soft, non-tender; bowel sounds normal; no masses,  no organomegaly Extremities: extremities normal, atraumatic, no cyanosis or edema and Homans sign is negative, no sign of DVT Wound: Sternum stable wound healing well  Lab Results:  CBC: Recent Labs    08/29/17 0400 08/30/17 0404  WBC 6.9 6.9  HGB 8.5* 8.7*  HCT 26.4* 26.8*  PLT 213 230   BMET:  Recent Labs    08/29/17 0400 08/30/17 0404  NA 137 137  K 3.7 3.7  CL 104 104  CO2 24 24  GLUCOSE 139* 124*  BUN 45* 39*  CREATININE 1.52* 1.42*  CALCIUM 7.8* 7.9*    CMET: Lab Results  Component Value Date   WBC 6.9 08/30/2017   HGB 8.7 (L) 08/30/2017   HCT 26.8 (L) 08/30/2017   PLT 230 08/30/2017   GLUCOSE 124 (H) 08/30/2017   ALT 18 08/20/2017   AST 13 (L) 08/20/2017   NA 137 08/30/2017   K 3.7 08/30/2017   CL 104 08/30/2017   CREATININE 1.42 (H) 08/30/2017   BUN 39 (H) 08/30/2017   CO2 24  08/30/2017   TSH 1.10 10/31/2012   INR 1.39 08/24/2017   HGBA1C 6.4 (H) 08/20/2017      PT/INR: No results for input(s): LABPROT, INR in the last 72 hours. Radiology: Dg Chest Port 1 View  Result Date: 08/30/2017 CLINICAL DATA:  Shortness of breath.  CABG on 08/24/2017. EXAM: PORTABLE CHEST 1 VIEW COMPARISON:  08/29/2017 and 08/28/2017 and 08/27/2017 FINDINGS: There are persistent small bilateral pleural effusions, left greater than right with persistent slight atelectasis at the left lung base. Pulmonary vascularity is normal.  No pneumothorax. Sheath remains in the superior vena cava. No acute bone abnormality. IMPRESSION: 1. Persistent small effusions, left greater than right. 2. Persistent slight atelectasis at the left lung base. Electronically Signed   By: Lorriane Shire M.D.   On: 08/30/2017 08:05   Stage 1 aki resolving with cr stable at 1.42 , baseline 0.9   Assessment/Plan: S/P Procedure(s) (LRB): CORONARY ARTERY BYPASS GRAFTING times four using left internal mammary artery and bilateral saphenous vein, using endoscope. TEE (N/A) TRANSESOPHAGEAL ECHOCARDIOGRAM (TEE) (N/A) Mobilize Diuresis Plan for transfer to step-down: see transfer orders     Grace Isaac 08/30/2017 8:34 AM

## 2017-08-30 NOTE — Progress Notes (Signed)
Occupational Therapy Treatment Patient Details Name: Tim [name] MRN: [id number] DOB: [birth year] Today's Date: 08/30/2017    History of present illness Pt is an 83 y.o. male s/p CABGx4 on 08/24/17. Pertinent PMH includes paralyzed hemidiaphragm, emphysema, CAD, AAA, asthma.    OT comments  Pt demonstrating good progress toward OT goals. He presented to therapy fatigued this session and nearly falling asleep at times during ADL. He was able to don/doff his socks with supervision this session and VC's for safe technique post-operatively. However, he does continue to require min assist for sit<>stand aspect of LB dressing tasks. Pt able to complete simulated toilet transfer with min assist and RW this session. He remains motivated to improve independence. OT will continue to follow while admitted.    Follow Up Recommendations  Home health OT    Equipment Recommendations  3 in 1 bedside commode    Recommendations for Other Services      Precautions / Restrictions Precautions Precautions: Sternal;Fall Precaution Comments: Pt required re-eduation on sternal precautions and cues to maintain Restrictions Weight Bearing Restrictions: Yes(sternal precautions) Other Position/Activity Restrictions: Sternal       Mobility Bed Mobility Overal bed mobility: Needs Assistance Bed Mobility: Rolling;Sidelying to Sit;Sit to Sidelying Rolling: Supervision Sidelying to sit: Min guard;HOB elevated     Sit to sidelying: Min assist General bed mobility comments: Min assist for B LE back into bed. VC's for technique.   Transfers Overall transfer level: Needs assistance Equipment used: Rolling walker (2 wheeled) Transfers: Sit to/from Stand Sit to Stand: Min assist         General transfer comment: Rocking into standing with heart pillow held at sternum. Min assist to power up.     Balance Overall balance assessment: Needs assistance Sitting-balance support: No upper extremity  supported;Feet supported Sitting balance-Leahy Scale: Good     Standing balance support: Bilateral upper extremity supported;No upper extremity supported Standing balance-Leahy Scale: Fair Standing balance comment: Can static stand with no UE support                           ADL either performed or assessed with clinical judgement   ADL Overall ADL's : Needs assistance/impaired     Grooming: Set up;Sitting               Lower Body Dressing: Sit to/from stand;Supervision/safety Lower Body Dressing Details (indicate cue type and reason): Min assist for sit<>stand             Functional mobility during ADLs: Minimal assistance;Rolling walker General ADL Comments: Min assist for power up. Pt very fatigued and nearly falling asleep during seated ADL at EOB.      Vision       Perception     Praxis      Cognition Arousal/Alertness: Awake/alert Behavior During Therapy: WFL for tasks assessed/performed Overall Cognitive Status: Within Functional Limits for tasks assessed                                          Exercises     Shoulder Instructions       General Comments Pt without supplemental O2 on my arrival. However, noted desaturation to 70% with poor wave form and re-applied 2L O2 with rebound to >90% throughout the rest of session.     Pertinent Vitals/ Pain  Pain Assessment: 0-10 Pain Score: 3  Faces Pain Scale: Hurts little more Pain Location: Sternal incision Pain Descriptors / Indicators: Sore;Discomfort Pain Intervention(s): Monitored during session;Repositioned  Home Living                                          Prior Functioning/Environment              Frequency  Min 2X/week        Progress Toward Goals  OT Goals(current goals can now be found in the care plan section)  Progress towards OT goals: Progressing toward goals  Acute Rehab OT Goals Patient Stated Goal: Get to  feeling better OT Goal Formulation: With patient Time For Goal Achievement: 09/10/17 Potential to Achieve Goals: Good  Plan Discharge plan remains appropriate    Co-evaluation                 AM-PAC PT "6 Clicks" Daily Activity     Outcome Measure   Help from another person eating meals?: None Help from another person taking care of personal grooming?: A Little Help from another person toileting, which includes using toliet, bedpan, or urinal?: A Little Help from another person bathing (including washing, rinsing, drying)?: A Little Help from another person to put on and taking off regular upper body clothing?: A Little Help from another person to put on and taking off regular lower body clothing?: A Little 6 Click Score: 19    End of Session Equipment Utilized During Treatment: Rolling walker;Oxygen  OT Visit Diagnosis: Unsteadiness on feet (R26.81)   Activity Tolerance Patient tolerated treatment well   Patient Left with call bell/phone within reach;with nursing/sitter in room;in bed   Nurse Communication          Time: 0093-8182 OT Time Calculation (min): 30 min  Charges: OT General Charges $OT Visit: 1 Visit OT Treatments $Self Care/Home Management : 23-37 mins  Norman Herrlich, MS OTR/L  Pager: [place] 08/30/2017, 4:56 PM

## 2017-08-31 ENCOUNTER — Inpatient Hospital Stay (HOSPITAL_COMMUNITY): Payer: Medicare Other

## 2017-08-31 LAB — CBC
HCT: 28 % — ABNORMAL LOW (ref 39.0–52.0)
Hemoglobin: 9 g/dL — ABNORMAL LOW (ref 13.0–17.0)
MCH: 28.6 pg (ref 26.0–34.0)
MCHC: 32.1 g/dL (ref 30.0–36.0)
MCV: 88.9 fL (ref 78.0–100.0)
Platelets: 301 10*3/uL (ref 150–400)
RBC: 3.15 MIL/uL — ABNORMAL LOW (ref 4.22–5.81)
RDW: 15.5 % (ref 11.5–15.5)
WBC: 9.9 10*3/uL (ref 4.0–10.5)

## 2017-08-31 LAB — GLUCOSE, CAPILLARY
Glucose-Capillary: 107 mg/dL — ABNORMAL HIGH (ref 65–99)
Glucose-Capillary: 112 mg/dL — ABNORMAL HIGH (ref 65–99)

## 2017-08-31 LAB — BASIC METABOLIC PANEL
Anion gap: 11 (ref 5–15)
BUN: 40 mg/dL — ABNORMAL HIGH (ref 6–20)
CO2: 24 mmol/L (ref 22–32)
Calcium: 8.3 mg/dL — ABNORMAL LOW (ref 8.9–10.3)
Chloride: 104 mmol/L (ref 101–111)
Creatinine, Ser: 1.49 mg/dL — ABNORMAL HIGH (ref 0.61–1.24)
GFR calc Af Amer: 47 mL/min — ABNORMAL LOW (ref >60)
GFR calc non Af Amer: 41 mL/min — ABNORMAL LOW (ref >60)
Glucose, Bld: 121 mg/dL — ABNORMAL HIGH (ref 65–99)
Potassium: 3.8 mmol/L (ref 3.5–5.1)
Sodium: 139 mmol/L (ref 135–145)

## 2017-08-31 LAB — TSH: TSH: 2.942 u[IU]/mL (ref 0.350–4.500)

## 2017-08-31 MED ORDER — METOPROLOL TARTRATE 12.5 MG HALF TABLET
12.5000 mg | ORAL_TABLET | Freq: Two times a day (BID) | ORAL | Status: DC
  Administered 2017-08-31 – 2017-09-03 (×7): 12.5 mg via ORAL
  Filled 2017-08-31 (×7): qty 1

## 2017-08-31 MED ORDER — FUROSEMIDE 10 MG/ML IJ SOLN
40.0000 mg | Freq: Once | INTRAMUSCULAR | Status: AC
  Administered 2017-08-31: 40 mg via INTRAVENOUS
  Filled 2017-08-31: qty 4

## 2017-08-31 MED ORDER — AMIODARONE HCL IN DEXTROSE 360-4.14 MG/200ML-% IV SOLN: 60.0000 mg/h | INTRAVENOUS | Status: DC

## 2017-08-31 MED ORDER — AMIODARONE HCL IN DEXTROSE 360-4.14 MG/200ML-% IV SOLN
30.0000 mg/h | INTRAVENOUS | Status: DC
  Administered 2017-08-31 – 2017-09-01 (×2): 30 mg/h via INTRAVENOUS
  Filled 2017-08-31 (×2): qty 200

## 2017-08-31 NOTE — Progress Notes (Signed)
CARDIAC REHAB PHASE I   PRE:  Rate/Rhythm: 86 SR    BP: sitting 105/56    SaO2: 99-100 RA  MODE:  Ambulation: 100 ft   POST:  Rate/Rhythm: 104 ST    BP: sitting 130/77     SaO2: 99 RA   Pt in bed, sts hes frustrated about not walking much but then he did walk a short distance with PT yesterday. Able to move out of the bed easily with good mechanics. Able to walk with RW but c/o his left knee feeling weak and unstable. Also some lightheadedness. Pt returned to bed due to needing to pull wires. Encouraged more walking and IS/flutter. He is doing fairly well but frustrated with slow progress. Will f/u. [place], ACSM 08/31/2017 11:35 AM

## 2017-08-31 NOTE — Care Management Important Message (Signed)
Important Message  Patient Details  Name: Tim [name] MRN: [id number] Date of Birth: 14-Sep-1929   Medicare Important Message Given:  Yes    [name], [name] 08/31/2017, 10:04 AM

## 2017-08-31 NOTE — Care Management Important Message (Signed)
Important Message  Patient Details  Name: Tim [name] MRN: [id number] Date of Birth: [birth year]   Medicare Important Message Given:  Yes    [name], [name] 08/31/2017, 10:06 AM

## 2017-08-31 NOTE — Progress Notes (Signed)
Physical Therapy Treatment Patient Details Name: Tim [name] MRN: [id number] DOB: Mar 05, 1929 Today's Date: 08/31/2017    History of Present Illness Pt is an 83 y.o. male s/p CABGx4 on 08/24/17. Pertinent PMH includes paralyzed hemidiaphragm, emphysema, CAD, AAA, asthma.     PT Comments    RN reports patient is doing well this afternoon; pacing wires were pulled between 1-2pm and patient is OK to participate with skilled PT services. Patient received in bed, very pleasant and willing to work with skilled PT services. He continues to require assistance to maintain sternal precautions with mobility; note gait limited by discomfort back and knees this session as well as reports of patient fatigue. Gait trained approximately 69f in hallway, again limited by fatigue. Assisted patient up to chair per patient request, including an additional 159fof ambulation in the room. Patient left up in chair with all needs met, RN aware of patient status.    Follow Up Recommendations  Home health PT;Supervision - Intermittent     Equipment Recommendations  Rolling walker with 5" wheels    Recommendations for Other Services OT consult     Precautions / Restrictions Precautions Precautions: Sternal;Fall Precaution Comments: Pt required re-eduation on sternal precautions and cues to maintain Restrictions Weight Bearing Restrictions: Yes(sternal precautions ) Other Position/Activity Restrictions: Sternal    Mobility  Bed Mobility Overal bed mobility: Needs Assistance Bed Mobility: Rolling;Sidelying to Sit Rolling: Min guard Sidelying to sit: Min guard       General bed mobility comments: cues to maintain sternal precautions   Transfers Overall transfer level: Needs assistance Equipment used: Rolling walker (2 wheeled) Transfers: Sit to/from Stand Sit to Stand: Min guard         General transfer comment: cues to maintain sternal precautions, hugging sternal pillow, used rocking and  multiple attempts for standing   Ambulation/Gait Ambulation/Gait assistance: Min guard Ambulation Distance (Feet): 80 Feet([id number] device: Rolling walker (2 wheeled) Gait Pattern/deviations: Step-through pattern;Decreased stride length;Trunk flexed     General Gait Details: gait on RA, patient more limited by discomfort in knees and back; unable to get accurate reading per pulse ox but finger monitor revealed O2 remaining generally above 90% with activity    Stairs            Wheelchair Mobility    Modified Rankin (Stroke Patients Only)       Balance Overall balance assessment: Needs assistance Sitting-balance support: No upper extremity supported;Feet supported Sitting balance-Leahy Scale: Good     Standing balance support: Bilateral upper extremity supported;During functional activity Standing balance-Leahy Scale: Fair                              Cognition Arousal/Alertness: Awake/alert Behavior During Therapy: WFL for tasks assessed/performed Overall Cognitive Status: Within Functional Limits for tasks assessed                                        Exercises General Exercises - Lower Extremity Long Arc Quad: Both;10 reps;Seated Hip ABduction/ADduction: Both;10 reps;Seated(seated at edge of bed, manual resistance from PT ) Hip Flexion/Marching: Both;20 reps;Seated    General Comments General comments (skin integrity, edema, etc.): session on room air with no significant desaturation noted today       Pertinent Vitals/Pain Pain Assessment: No/denies pain Pain Intervention(s): Monitored during session  Home Living                      Prior Function            PT Goals (current goals can now be found in the care plan section) Acute Rehab PT Goals Patient Stated Goal: Get to feeling better PT Goal Formulation: With patient Time For Goal Achievement: 09/09/17 Potential to Achieve Goals:  Good Progress towards PT goals: Progressing toward goals    Frequency    Min 3X/week      PT Plan Current plan remains appropriate    Co-evaluation              AM-PAC PT "6 Clicks" Daily Activity  Outcome Measure  Difficulty turning over in bed (including adjusting bedclothes, sheets and blankets)?: Unable Difficulty moving from lying on back to sitting on the side of the bed? : Unable Difficulty sitting down on and standing up from a chair with arms (e.g., wheelchair, bedside commode, etc,.)?: Unable Help needed moving to and from a bed to chair (including a wheelchair)?: A Little Help needed walking in hospital room?: A Little Help needed climbing 3-5 steps with a railing? : A Little 6 Click Score: 12    End of Session   Activity Tolerance: Patient tolerated treatment well Patient left: in chair;with call bell/phone within reach Nurse Communication: Mobility status PT Visit Diagnosis: Other abnormalities of gait and mobility (R26.89);Pain Pain - part of body: (sternal )     Time: 1448-1856 PT Time Calculation (min) (ACUTE ONLY): 30 min  Charges:  $Gait Training: 8-22 mins $Therapeutic Exercise: 8-22 mins                    G Codes:       Deniece Ree PT, DPT, CBIS  Supplemental Physical Therapist [place]

## 2017-08-31 NOTE — Progress Notes (Addendum)
      [address]       [place] [address]             [phone]        7 Days Post-Op Procedure(s) (LRB): CORONARY ARTERY BYPASS GRAFTING times four using left internal mammary artery and bilateral saphenous vein, using endoscope. TEE (N/A) TRANSESOPHAGEAL ECHOCARDIOGRAM (TEE) (N/A)  Subjective: He just returned from chest x ray. His biggest complaint is wheezing-it wakes him up  Objective: Vital signs in last 24 hours: Temp:  [98 F (36.7 C)-98.4 F (36.9 C)] 98.4 F (36.9 C) (12/18 0430) Pulse Rate:  [76-101] 78 (12/18 0430) Cardiac Rhythm: Normal sinus rhythm (12/17 1903) Resp:  [19-29] 25 (12/18 0430) BP: (100-139)/(54-103) 114/60 (12/18 0430) SpO2:  [91 %-100 %] 100 % (12/18 0430) Weight:  [208 lb 12.4 oz (94.7 kg)] 208 lb 12.4 oz (94.7 kg) (12/18 0430)  Pre op weight 90.2 kg Current Weight  08/31/17 208 lb 12.4 oz (94.7 kg)      Intake/Output from previous day: 12/17 0701 - 12/18 0700 In: 220 [P.O.:120; IV Piggyback:100] Out: 625 [Urine:625]   Physical Exam:  Cardiovascular: RRR Pulmonary: Wheezing Abdomen: Soft, non tender, bowel sounds present. Extremities: Mild bilateral lower extremity edema. Wounds: Clean and dry.  No erythema or signs of infection.  Lab Results: CBC: Recent Labs    08/30/17 0404 08/31/17 0429  WBC 6.9 9.9  HGB 8.7* 9.0*  HCT 26.8* 28.0*  PLT 230 301   BMET:  Recent Labs    08/30/17 0404 08/31/17 0429  NA 137 139  K 3.7 3.8  CL 104 104  CO2 24 24  GLUCOSE 124* 121*  BUN 39* 40*  CREATININE 1.42* 1.49*  CALCIUM 7.9* 8.3*    PT/INR:  Lab Results  Component Value Date   INR 1.39 08/24/2017   INR 1.01 08/20/2017   INR 1.0 07/27/2017   ABG:  INR: Will add last result for INR, ABG once components are confirmed Will add last 4 CBG results once components are confirmed  Assessment/Plan: 1. CV-Previous a fib with RVR. On Amiodarone 200 mg bid, Atenolol 50 mg daily. As discussed with Dr.  Servando Snare, will stop Atenolol and start low dose Lopressor as has more wheezing. 2. Pulmonary-Has COPD, ? history of elevated right hemidaphragm. On 2 liters of oxygen via [place]. Will wean as able. Xopenex PRN. Encourage incentive spirometer and flutter valve. 3. Volume overload-on Lasix 40 mg daily 4. ABL anemia-H and H this am stable at 9 and 28 5.  CBGs 115/137/107. On Insulin drip. Pre op HGA1C 6.4. He likely has pre diabetes. Will stop accu checks and SS PRN.  7. BPH-on Proscar and Flomax 8. Statin intolerant so not on-will defer to cardiology after discharge  9. AKI (stage I)-creatinine this am 1.49 (creatinine upon admission 0.9) 10. Supplement potassium 11. TSH "WNL" at 2.942. 12. Remove EPW   Tim M ZimmermanPA-C 08/31/2017,7:21 AM  Some increased wheezing when changed back to home beta  blocker, will keep on low dose lopressor Better this afternoon, but has not been walking much since got to 4e I have seen and examined Tim [name] and agree with the above assessment  and plan.  Grace Isaac MD Beeper [phone] Office [phone] 08/31/2017 3:10 PM

## 2017-08-31 NOTE — Progress Notes (Signed)
Pulled pacer wires per order. Patient tolerated well and VS are stable. B/P 98/53 Heart Rate 81, Resp 21. Wires intact

## 2017-08-31 NOTE — Progress Notes (Signed)
Patient having some wheezing, respiratory notified, prn med administered, on call MD notified. No new order received at this time. Will continue to monitor.

## 2017-09-01 ENCOUNTER — Encounter (INDEPENDENT_AMBULATORY_CARE_PROVIDER_SITE_OTHER): Payer: Medicare Other | Admitting: Ophthalmology

## 2017-09-01 NOTE — Care Management (Signed)
CM spoke to patients daughter over the phone about home oxygen choice. They would like to use [facility]. CM will notify [facility] of referral.  Daughter expressed concerns about patient discharging home and would like to see if Dr Servando Snare felt that a rehab stay would benefit the patient. CM will leave in handoff to have this f/u in the am. CM continuing to follow.

## 2017-09-01 NOTE — Progress Notes (Signed)
CARDIAC REHAB PHASE I   PRE:  Rate/Rhythm: 73 SR    BP: sitting 98/55    SaO2: 99 3L finger, 98 RA ear  MODE:  Ambulation: 140 ft   POST:  Rate/Rhythm: 103 ST    BP: sitting 126/63     SaO2: 99 RA ear  Pt in recliner, willing to walk again. C/o lightheadedness at sink, closing eyes. Sat and BP 99/64, SAO2 96 RA on ear. Pt able to stand, BP 106/61. Walked with RW, gait belt, slow pace. Continued to c/o his head feeling foggy walking, closing eyes intermittently. Pt declined needing to sit and he was able to increase distance some today. He is congested and fatigues easily, sts he is "just give out" after walking. Encouraged 1-2 more walks today and IS/flutter. Return to recliner for lunch then will need nap in bed.  [phone]   [place], ACSM 09/01/2017 11:40 AM

## 2017-09-01 NOTE — Progress Notes (Signed)
Physical Therapy Treatment Patient Details Name: Tim [name] MRN: [id number] DOB: [birth year] Today's Date: 09/01/2017    History of Present Illness Pt is an 83 y.o. male s/p CABGx4 on 08/24/17. Pertinent PMH includes paralyzed hemidiaphragm, emphysema, CAD, AAA, asthma.     PT Comments    Patient received in bed, pleasant and willing to work with PT but continuing to voice frustration regarding his current status. Education provided on patient status following major surgery as well as general course of progression with activity and skilled PT services, patient encouraged to communicate openly with RN/cardiac rehab/MD regarding concerns as well. Gait training approximately 164f today on 3LPM O2, improved gait tolerance noted and O2 sats remaining in 90s however patient did continue to experience shortness of breath with gait. Patient left up in chair with all needs met and questions/concerns addressed this morning.    Follow Up Recommendations  Home health PT;Supervision - Intermittent     Equipment Recommendations  Rolling walker with 5" wheels    Recommendations for Other Services OT consult     Precautions / Restrictions Precautions Precautions: Sternal;Fall Precaution Comments: Pt required re-eduation on sternal precautions and cues to maintain Restrictions Weight Bearing Restrictions: Yes Other Position/Activity Restrictions: Sternal    Mobility  Bed Mobility Overal bed mobility: Needs Assistance Bed Mobility: Rolling;Sidelying to Sit Rolling: Min guard Sidelying to sit: Min guard       General bed mobility comments: cues to maintain sternal precautions, patient with difficulty in maintaining precautions without cues   Transfers Overall transfer level: Needs assistance Equipment used: Rolling walker (2 wheeled) Transfers: Sit to/from Stand Sit to Stand: Min guard         General transfer comment: hugging sternal pillow, cues to rock/multiple attempts for  transfer   Ambulation/Gait Ambulation/Gait assistance: Supervision Ambulation Distance (Feet): 100 Feet Assistive device: Rolling walker (2 wheeled) Gait Pattern/deviations: Step-through pattern;Decreased stride length;Trunk flexed     General Gait Details: giat on 3LPM O2, improved tolerance to activity noted, O2 remained in 90s during gait    Stairs            Wheelchair Mobility    Modified Rankin (Stroke Patients Only)       Balance Overall balance assessment: Needs assistance Sitting-balance support: No upper extremity supported;Feet supported Sitting balance-Leahy Scale: Good     Standing balance support: Bilateral upper extremity supported;During functional activity Standing balance-Leahy Scale: Good                              Cognition Arousal/Alertness: Awake/alert Behavior During Therapy: WFL for tasks assessed/performed Overall Cognitive Status: Within Functional Limits for tasks assessed                                        Exercises      General Comments        Pertinent Vitals/Pain Pain Assessment: No/denies pain Pain Intervention(s): Monitored during session    Home Living                      Prior Function            PT Goals (current goals can now be found in the care plan section) Acute Rehab PT Goals Patient Stated Goal: Get to feeling better PT Goal Formulation: With patient Time For Goal Achievement: 09/09/17  Potential to Achieve Goals: Good Progress towards PT goals: Progressing toward goals    Frequency    Min 3X/week      PT Plan Current plan remains appropriate    Co-evaluation              AM-PAC PT "6 Clicks" Daily Activity  Outcome Measure  Difficulty turning over in bed (including adjusting bedclothes, sheets and blankets)?: Unable Difficulty moving from lying on back to sitting on the side of the bed? : Unable Difficulty sitting down on and standing up  from a chair with arms (e.g., wheelchair, bedside commode, etc,.)?: Unable Help needed moving to and from a bed to chair (including a wheelchair)?: A Little Help needed walking in hospital room?: A Little Help needed climbing 3-5 steps with a railing? : A Little 6 Click Score: 12    End of Session Equipment Utilized During Treatment: Oxygen Activity Tolerance: Patient tolerated treatment well Patient left: in chair;with call bell/phone within reach   PT Visit Diagnosis: Other abnormalities of gait and mobility (R26.89);Pain Pain - part of body: (sternal )     Time: 2256-7209 PT Time Calculation (min) (ACUTE ONLY): 23 min  Charges:  $Gait Training: 8-22 mins $Self Care/Home Management: 8-22                    G Codes:       Deniece Ree PT, DPT, CBIS  Supplemental Physical Therapist [place]

## 2017-09-01 NOTE — Progress Notes (Addendum)
      [address]       [place] [address]             [phone]        8 Days Post-Op Procedure(s) (LRB): CORONARY ARTERY BYPASS GRAFTING times four using left internal mammary artery and bilateral saphenous vein, using endoscope. TEE (N/A) TRANSESOPHAGEAL ECHOCARDIOGRAM (TEE) (N/A)  Subjective: He states he slept well. He is not having a good morning. His breakfast was cold and he is a bit aggravated.  Objective: Vital signs in last 24 hours: Temp:  [97.5 F (36.4 C)] 97.5 F (36.4 C) (12/19 0759) Pulse Rate:  [72-126] 84 (12/19 0759) Cardiac Rhythm: Normal sinus rhythm (12/19 0702) Resp:  [20-25] 21 (12/19 0759) BP: (100-117)/(54-67) 117/67 (12/19 0759) SpO2:  [97 %-100 %] 98 % (12/19 0759) Weight:  [198 lb 10.2 oz (90.1 kg)] 198 lb 10.2 oz (90.1 kg) (12/19 0742)  Pre op weight 90.2 kg Current Weight  09/01/17 198 lb 10.2 oz (90.1 kg)      Intake/Output from previous day: No intake/output data recorded.   Physical Exam:  Cardiovascular: RRR Pulmonary: Mostly clear Abdomen: Soft, non tender, bowel sounds present. Extremities: Trace bilateral lower extremity edema. Wounds: Clean and dry.  Slight erythema around distal sternal incision. No drainage.  Lab Results: CBC: Recent Labs    08/30/17 0404 08/31/17 0429  WBC 6.9 9.9  HGB 8.7* 9.0*  HCT 26.8* 28.0*  PLT 230 301   BMET:  Recent Labs    08/30/17 0404 08/31/17 0429  NA 137 139  K 3.7 3.8  CL 104 104  CO2 24 24  GLUCOSE 124* 121*  BUN 39* 40*  CREATININE 1.42* 1.49*  CALCIUM 7.9* 8.3*    PT/INR:  Lab Results  Component Value Date   INR 1.39 08/24/2017   INR 1.01 08/20/2017   INR 1.0 07/27/2017   ABG:  INR: Will add last result for INR, ABG once components are confirmed Will add last 4 CBG results once components are confirmed  Assessment/Plan: 1. CV-Previous a fib with RVR. Put back on Amiodarone drip last night as went back into a fib with RVR. Will stop drip  this am and continue Amiodarone 200 mg bid, Lopressor 12.5 mg bid. Hopefully, will remain in SR so can avoid anticoagulation. 2. Pulmonary-Has COPD, ? history of elevated right hemidaphragm. On 2 liters of oxygen via [place]. Will likely need oxygen at discharge. Xopenex PRN. Encourage incentive spirometer and flutter valve. 3. Volume overload-on Lasix 40 mg daily 4. ABL anemia-H and H this am stable at 9 and 28 5. BPH-on Proscar and Flomax 6. Statin intolerant so not on-will defer to cardiology after discharge  7. AKI (stage I)-last creatinine 1.49 (creatinine upon admission 0.9)   Donielle M ZimmermanPA-C 09/01/2017,8:30 AM  Walked more today  Likely will need o2 at home  Poss home 2 days more  I have seen and examined Tim [name] and agree with the above assessment  and plan.  Grace Isaac MD Beeper [phone] Office [phone] 09/01/2017 5:39 PM

## 2017-09-02 ENCOUNTER — Ambulatory Visit: Payer: Medicare Other | Admitting: Cardiology

## 2017-09-02 LAB — BASIC METABOLIC PANEL
Anion gap: 7 (ref 5–15)
BUN: 32 mg/dL — ABNORMAL HIGH (ref 6–20)
CO2: 27 mmol/L (ref 22–32)
Calcium: 8.3 mg/dL — ABNORMAL LOW (ref 8.9–10.3)
Chloride: 105 mmol/L (ref 101–111)
Creatinine, Ser: 1.34 mg/dL — ABNORMAL HIGH (ref 0.61–1.24)
GFR calc Af Amer: 54 mL/min — ABNORMAL LOW (ref >60)
GFR calc non Af Amer: 46 mL/min — ABNORMAL LOW (ref >60)
Glucose, Bld: 134 mg/dL — ABNORMAL HIGH (ref 65–99)
Potassium: 4.1 mmol/L (ref 3.5–5.1)
Sodium: 139 mmol/L (ref 135–145)

## 2017-09-02 LAB — GLUCOSE, CAPILLARY: Glucose-Capillary: 111 mg/dL — ABNORMAL HIGH (ref 65–99)

## 2017-09-02 NOTE — Progress Notes (Signed)
      [address]       [place] [address]             [phone]        9 Days Post-Op Procedure(s) (LRB): CORONARY ARTERY BYPASS GRAFTING times four using left internal mammary artery and bilateral saphenous vein, using endoscope. TEE (N/A) TRANSESOPHAGEAL ECHOCARDIOGRAM (TEE) (N/A)  Subjective: He   Objective: Vital signs in last 24 hours: Temp:  [97.1 F (36.2 C)-98.3 F (36.8 C)] 98.3 F (36.8 C) (12/20 0400) Pulse Rate:  [66-92] 77 (12/20 0400) Cardiac Rhythm: Normal sinus rhythm (12/19 1900) Resp:  [17-27] 24 (12/20 0400) BP: (100-149)/(51-67) 122/61 (12/20 0400) SpO2:  [94 %-100 %] 94 % (12/20 0400) Weight:  [198 lb 10.2 oz (90.1 kg)] 198 lb 10.2 oz (90.1 kg) (12/19 0742)  Pre op weight 90.2 kg Current Weight  09/01/17 198 lb 10.2 oz (90.1 kg)      Intake/Output from previous day: 12/19 0701 - 12/20 0700 In: -  Out: 1300 [Urine:1300]   Physical Exam:  Cardiovascular: RRR Pulmonary: Mostly clear Abdomen: Soft, non tender, bowel sounds present. Extremities: Trace bilateral lower extremity edema. Wounds: Clean and dry.  Slight erythema around distal sternal incision. No drainage.  Lab Results: CBC: Recent Labs    08/31/17 0429  WBC 9.9  HGB 9.0*  HCT 28.0*  PLT 301   BMET:  Recent Labs    08/31/17 0429 09/02/17 0602  NA 139 139  K 3.8 4.1  CL 104 105  CO2 24 27  GLUCOSE 121* 134*  BUN 40* 32*  CREATININE 1.49* 1.34*  CALCIUM 8.3* 8.3*    PT/INR:  Lab Results  Component Value Date   INR 1.39 08/24/2017   INR 1.01 08/20/2017   INR 1.0 07/27/2017   ABG:  INR: Will add last result for INR, ABG once components are confirmed Will add last 4 CBG results once components are confirmed  Assessment/Plan: 1. CV-Previous a fib with RVR. Put back on Amiodarone the night of 12/18 as went back into a fib with RVR. Maintaining SR since yesterday morning.s continue Amiodarone 200 mg bid, Lopressor 12.5 mg bid. Hopefully,  will remain in SR so can avoid anticoagulation. 2. Pulmonary-Has COPD, ? history of elevated right hemidaphragm. On room air this am but has been on 2 liters of oxygen via [place]. Will ask nurse to evaluate to see if needs oxygen. Xopenex PRN. Encourage incentive spirometer and flutter valve. 3. Volume overload-on Lasix 40 mg daily 4. ABL anemia-H and H this am stable at 9 and 28 5. BPH-on Proscar and Flomax 6. Statin intolerant so not on-will defer to cardiology after discharge  7. AKI (stage I)-last creatinine 1.34 (creatinine upon admission 0.9) 8. Hope to discharge in am. I spoke with daughter and await social work to see if he is able to go to SNF for short stay as is very deconditioned.  Donielle M ZimmermanPA-C 09/02/2017,7:13 AM

## 2017-09-02 NOTE — Progress Notes (Signed)
PT Cancellation Note  Patient Details Name: AUGUSTEN LIPKIN MRN: [id number] DOB: April 06, 1929   Cancelled Treatment:    Reason Eval/Treat Not Completed: Patient at procedure or test/unavailable Patient actively participating with cardiac rehab, PT to try back later in day as time allows.   Deniece Ree PT, DPT, CBIS  Supplemental Physical Therapist [facility]

## 2017-09-02 NOTE — Progress Notes (Signed)
Physical Therapy Treatment Patient Details Name: Tim [name] MRN: [id number] DOB: 1929-03-23 Today's Date: 09/02/2017    History of Present Illness Pt is an 83 y.o. male s/p CABGx4 on 08/24/17. Pertinent PMH includes paralyzed hemidiaphragm, emphysema, CAD, AAA, asthma.     PT Comments    Patient received after participating with cardiac rehab, reporting high levels of fatigue and requesting in room exercise only this afternoon. Focused on functional exercises in seated and standing positions today, with rest breaks as needed per patient fatigue. Note HR increasing to up to 120-125BPM with standing exercises today, patient limited by fatigue with exercise this session. Patient left up in chair with all needs met this afternoon.     Follow Up Recommendations  Supervision - Intermittent;SNF(patient requesting and appears appropriate for SNF due to concerns about mobility and fatigue, safety )     Equipment Recommendations  Rolling walker with 5" wheels    Recommendations for Other Services       Precautions / Restrictions Precautions Precautions: Sternal;Fall Precaution Comments: Pt required re-eduation on sternal precautions and cues to maintain Restrictions Weight Bearing Restrictions: No Other Position/Activity Restrictions: Sternal    Mobility  Bed Mobility               General bed mobility comments: DNT, received up in chair   Transfers Overall transfer level: Needs assistance Equipment used: Rolling walker (2 wheeled) Transfers: Sit to/from Stand Sit to Stand: Min guard         General transfer comment: cues for sternal precautions   Ambulation/Gait             General Gait Details: DNT, patient fatigued after completing cardiac rehab prior to PT session    Stairs            Wheelchair Mobility    Modified Rankin (Stroke Patients Only)       Balance Overall balance assessment: Needs assistance Sitting-balance support: No upper  extremity supported;Feet supported Sitting balance-Leahy Scale: Good     Standing balance support: Bilateral upper extremity supported;During functional activity Standing balance-Leahy Scale: Good                              Cognition Arousal/Alertness: Awake/alert Behavior During Therapy: WFL for tasks assessed/performed Overall Cognitive Status: Within Functional Limits for tasks assessed                                        Exercises General Exercises - Lower Extremity Ankle Circles/Pumps: Seated;10 reps;Both Long Arc Quad: Both;10 reps;Seated Hip ABduction/ADduction: Both;10 reps;Seated(manual resistance from PT ) Hip Flexion/Marching: Both;20 reps;Seated;Standing Heel Raises: Both;10 reps;Standing    General Comments General comments (skin integrity, edema, etc.): patient highly fatigued after working with cardiac rehab prior to this session, requests in room exercises only       Pertinent Vitals/Pain Pain Assessment: No/denies pain Pain Intervention(s): Monitored during session    Home Living                      Prior Function            PT Goals (current goals can now be found in the care plan section) Acute Rehab PT Goals Patient Stated Goal: Get to feeling better PT Goal Formulation: With patient Time For Goal Achievement: 09/09/17 Potential to Achieve Goals:  Good Progress towards PT goals: Progressing toward goals    Frequency    Min 3X/week      PT Plan Current plan remains appropriate    Co-evaluation              AM-PAC PT "6 Clicks" Daily Activity  Outcome Measure  Difficulty turning over in bed (including adjusting bedclothes, sheets and blankets)?: Unable Difficulty moving from lying on back to sitting on the side of the bed? : Unable Difficulty sitting down on and standing up from a chair with arms (e.g., wheelchair, bedside commode, etc,.)?: Unable Help needed moving to and from a bed to  chair (including a wheelchair)?: A Little Help needed walking in hospital room?: A Little Help needed climbing 3-5 steps with a railing? : A Little 6 Click Score: 12    End of Session   Activity Tolerance: Patient tolerated treatment well;Patient limited by fatigue Patient left: in chair;with call bell/phone within reach   PT Visit Diagnosis: Other abnormalities of gait and mobility (R26.89);Pain     Time: 1530-1550 PT Time Calculation (min) (ACUTE ONLY): 20 min  Charges:  $Therapeutic Exercise: 8-22 mins                    G Codes:       Deniece Ree PT, DPT, CBIS  Supplemental Physical Therapist [place]

## 2017-09-02 NOTE — Progress Notes (Signed)
SATURATION QUALIFICATIONS: (This note is used to comply with regulatory documentation for home oxygen)  Patient Saturations on Room Air at Rest = 98%  Patient Saturations on Room Air while Ambulating = 95%  Patient Saturations on N/A Liters of oxygen while Ambulating = N/A  Please briefly explain why patient needs home oxygen: N/A

## 2017-09-02 NOTE — Progress Notes (Signed)
CARDIAC REHAB PHASE I   PRE:  Rate/Rhythm: 85 SR    BP: sitting 112/63    SaO2: 97 RA  MODE:  Ambulation: 320 ft   POST:  Rate/Rhythm: 124 ST    BP: sitting 94/72     SaO2: 93-98 RA  Pt reluctant to walk, sts he is cold. Got a warm blanket for pt. He moves well, good mechanics. Able to walk with RW 140 ft, then sat and rested in room for 8 min. Pt sts he continues to feel like his head is foggy when he is up and walking. He also c/o unstable knees. Able to walk another 180 ft then to recliner. I took pts BP sitting and standing to check orthostasis but unremarkable. However his HR is high walking today and BP slightly lower. Encouraged pt to keep mobility up.  [place], ACSM 09/02/2017 2:54 PM

## 2017-09-03 MED ORDER — TRAMADOL HCL 50 MG PO TABS: 50.0000 mg | ORAL_TABLET | Freq: Four times a day (QID) | ORAL | 0 refills | Status: DC | PRN

## 2017-09-03 MED ORDER — GUAIFENESIN ER 600 MG PO TB12: 600.0000 mg | ORAL_TABLET | Freq: Two times a day (BID) | ORAL | Status: DC | PRN

## 2017-09-03 MED ORDER — POTASSIUM CHLORIDE CRYS ER 20 MEQ PO TBCR
20.0000 meq | EXTENDED_RELEASE_TABLET | Freq: Every day | ORAL | Status: DC
Start: 2017-09-04 — End: 2017-09-28

## 2017-09-03 MED ORDER — AMIODARONE HCL 200 MG PO TABS: 200.0000 mg | ORAL_TABLET | Freq: Every day | ORAL | Status: DC

## 2017-09-03 MED ORDER — GLUCERNA SHAKE PO LIQD: 237.0000 mL | Freq: Three times a day (TID) | ORAL | 0 refills | Status: DC

## 2017-09-03 MED ORDER — METOPROLOL TARTRATE 25 MG PO TABS: 12.5000 mg | ORAL_TABLET | Freq: Two times a day (BID) | ORAL | Status: DC

## 2017-09-03 NOTE — Clinical Social Work Placement (Addendum)
   CLINICAL SOCIAL WORK PLACEMENT  NOTE  Date:  09/03/2017  Patient Details  Name: Tim [name] MRN: [id number] Date of Birth: [birth year]  Clinical Social Work is seeking post-discharge placement for this patient at the [place] level of care (*CSW will initial, date and re-position this form in  chart as items are completed):      Patient/family provided with [facility] Work Department's list of facilities offering this level of care within the geographic area requested by the patient (or if unable, by the patient's family).  Yes   Patient/family informed of their freedom to choose among providers that offer the needed level of care, that participate in Medicare, Medicaid or managed care program needed by the patient, have an available bed and are willing to accept the patient.      Patient/family informed of [facility]'s ownership interest in [facility] and [facility], as well as of the fact that they are under no obligation to receive care at these facilities.  PASRR submitted to EDS on       PASRR number received on 09/03/17     Existing PASRR number confirmed on       FL2 transmitted to all facilities in geographic area requested by pt/family on 09/03/17     FL2 transmitted to all facilities within larger geographic area on       Patient informed that his/her managed care company has contracts with or will negotiate with certain facilities, including the following:        Yes   Patient/family informed of bed offers received.  Patient chooses bed at [facility]  Physician recommends and patient chooses bed at      Patient to be transferred to [facility] on 09/03/17.  Patient to be transferred to facility by Corey Harold)     Patient family notified on 09/03/17 of transfer.  Name of family member notified:  [name]     PHYSICIAN Please prepare priority discharge summary, including medications, Please prepare  prescriptions, Please sign FL2     Additional Comment:    _______________________________________________ Eileen Stanford, LCSW 09/03/2017, 2:42 PM

## 2017-09-03 NOTE — Clinical Social Work Note (Signed)
Clinical Social Work Assessment  Patient Details  Name: Tim [name] MRN: [id number] Date of Birth: 05-02-1929  Date of referral:  09/03/17               Reason for consult:  Facility Placement                Permission sought to share information with:  Family Supports Permission granted to share information::  Yes, Verbal Permission Granted  Name::     Financial planner::     Relationship::  Daughter in Financial trader Information:  [phone]  Housing/Transportation Living arrangements for the past 2 months:  [place] of Information:  Patient Patient Interpreter Needed:  None Criminal Activity/Legal Involvement Pertinent to Current Situation/Hospitalization:  No - Comment as needed Significant Relationships:  Adult Children Lives with:  Adult Children Do you feel safe going back to the place where you live?  Yes Need for family participation in patient care:     Care giving concerns:  No family or friends present at bedside during initial assessment. Pt gave CSW verbal permission to contact daughter in law.    Social Worker assessment / plan:  CSW met with pt at bedside. Pt understands the need for SNF placement at d/c. Pt states he wants to go somewhere near [facility], where he resides. Pt states his son and daughter in law will be making the placement decision. Pt verbalized permission for CSW to contact daughter in law. CSW will follow up with daughter in law and f/o.   Employment status:  Retired Forensic scientist:  Medicare PT Recommendations:  [place] / Referral to community resources:  [place]  Patient/Family's Response to care:  Pt verbalized understanding of CSW role and expressed appreciation for support. Pt denies any concern regarding pt care at this time.   Patient/Family's Understanding of and Emotional Response to Diagnosis, Current Treatment, and Prognosis:  Pt understanding and realistic  regarding physical limitations. Pt understands the need for SNF placement at d/c. Pt agreeable to SNF placement at d/c, at this time. Pt's responses emotionally appropriate during conversation with CSW. Pt denies any concern regarding treatment plan at this time. CSW will continue to provide support and facilitate d/c needs.   Emotional Assessment Appearance:  Appears stated age Attitude/Demeanor/Rapport:  (Patient was appropriate.) Affect (typically observed):  Accepting, Appropriate, Calm Orientation:  Oriented to Self, Oriented to Place, Oriented to  Time, Oriented to Situation Alcohol / Substance use:  Not Applicable Psych involvement (Current and /or in the community):  No (Comment)  Discharge Needs  Concerns to be addressed:  Care Coordination Readmission within the last 30 days:  No Current discharge risk:  Dependent with Mobility Barriers to Discharge:  No Barriers Identified   Bridget A Cobb, LCSW 09/03/2017, 11:57 AM

## 2017-09-03 NOTE — Care Management Note (Signed)
Case Management Note Original Note Created Zenon Mayo, RN 08/27/2017, 11:25 AM  Patient Details  Name: Tim [name] MRN: [id number] Date of Birth: 1928-10-07  Subjective/Objective:    From home with son and daughter n law, son works 3rd shift and daughter in Sports coach just works 3 days during the week so he will have someone with him at all times, POD 3 CABG, Per pt /ot eval rec HHPT/HHOT,gave patient the [facility] agency list, he chose [facility], referral made for HHPT,HHOT.  They state start of care will be on  on Wed 12/19 to see patient.  Patient states he has a bsc and a rolling walker at home.  NCM faxed orders to [facility].                 Action/Plan: DC home when stable with [place] services with [facility], will need HHPT, HHOT orders with face to face.   Expected Discharge Date:  09/03/17               Expected Discharge Plan:  [place]  In-House Referral:  Clinical Social Work  Discharge planning Services  CM Consult  Post Acute Care Choice:  Durable Medical Equipment, Home Health Choice offered to:  Patient  DME Arranged:  3-N-1, Walker rolling DME Agency:     HH Arranged:  RN, PT, OT, Respirator Therapy [place] Agency:  [place]  Status of Service:  Completed, signed off  If discussed at [place] of Stay Meetings, dates discussed:    Discharge Disposition: skilled facility   Additional Comments:  09/03/17- 1530- Tim Gibbons RN, CM- plan now if for SNF placement- CSW consulted for placement needs- pt stable for discharge- CSW following for placement needs  Dawayne Patricia, RN 09/03/2017, 3:55 PM [phone]

## 2017-09-03 NOTE — Progress Notes (Signed)
09/03/2017 1645 Chest tube sutures removed.  Pt tolerated well. [place]

## 2017-09-03 NOTE — Care Management Important Message (Signed)
Important Message  Patient Details  Name: Tim [name] MRN: [id number] Date of Birth: [birth year]   Medicare Important Message Given:  Yes    Nathen May 09/03/2017, 9:52 AM

## 2017-09-03 NOTE — Discharge Instructions (Signed)
We ask the SNF to please do the following: °1. Please obtain vital signs at least one time daily °2.Please weigh the patient daily. If he or she continues to gain weight or develops lower extremity edema, contact the office at [phone]. °3. Ambulate patient at least three times daily and please use sternal precautions. ° °Coronary Artery Bypass Grafting, Care After °These instructions give you information on caring for yourself after your procedure. Your doctor may also give you more specific instructions. Call your doctor if you have any problems or questions after your procedure. °Follow these instructions at home: °· Only take medicine as told by your doctor. Take medicines exactly as told. Do not stop taking medicines or start any new medicines without talking to your doctor first. °· Take your pulse as told by your doctor. °· Do deep breathing as told by your doctor. Use your breathing device (incentive spirometer), if given, to practice deep breathing several times a day. Support your chest with a pillow or your arms when you take deep breaths or cough. °· Keep the area clean, dry, and protected where the surgery cuts (incisions) were made. Remove bandages (dressings) only as told by your doctor. If strips were applied to surgical area, do not take them off. They fall off on their own. °· Check the surgery area daily for puffiness (swelling), redness, or leaking fluid. °· If surgery cuts were made in your legs: °? Avoid crossing your legs. °? Avoid sitting for long periods of time. Change positions every 30 minutes. °? Raise your legs when you are sitting. Place them on pillows. °· Wear stockings that help keep blood clots from forming in your legs (compression stockings). °· Only take sponge baths until your doctor says it is okay to take showers. Pat the surgery area dry. Do not rub the surgery area with a washcloth or towel. Do not bathe, swim, or use a hot tub until your doctor says it is  okay. °· Eat foods that are high in fiber. These include raw fruits and vegetables, whole grains, beans, and nuts. Choose lean meats. Avoid canned, processed, and fried foods. °· Drink enough fluids to keep your pee (urine) clear or pale yellow. °· Weigh yourself every day. °· Rest and limit activity as told by your doctor. You may be told to: °? Stop any activity if you have chest pain, shortness of breath, changes in heartbeat, or dizziness. Get help right away if this happens. °? Move around often for short amounts of time or take short walks as told by your doctor. Gradually become more active. You may need help to strengthen your muscles and build endurance. °? Avoid lifting, pushing, or pulling anything heavier than 10 pounds (4.5 kg) for at least 6 weeks after surgery. °· Do not drive until your doctor says it is okay. °· Ask your doctor when you can go back to work. °· Ask your doctor when you can begin sexual activity again. °· Follow up with your doctor as told. °Contact a doctor if: °· You have puffiness, redness, more pain, or fluid draining from the incision site. °· You have a fever. °· You have puffiness in your ankles or legs. °· You have pain in your legs. °· You gain 2 or more pounds (0.9 kg) a day. °· You feel sick to your stomach (nauseous) or throw up (vomit). °· You have watery poop (diarrhea). °Get help right away if: °· You have chest pain that goes to your   jaw or arms.  You have shortness of breath.  You have a fast or irregular heartbeat.  You notice a "clicking" in your breastbone when you move.  You have numbness or weakness in your arms or legs.  You feel dizzy or light-headed. This information is not intended to replace advice given to you by your health care provider. Make sure you discuss any questions you have with your health care provider. Document Released: 09/05/2013 Document Revised: 02/06/2016 Document Reviewed: 02/07/2013 Elsevier Interactive Patient Education   2017 Elsevier Inc.   Preventing Type 2 Diabetes Mellitus Type 2 diabetes (type 2 diabetes mellitus) is a long-term (chronic) disease that affects blood sugar (glucose) levels. Normally, a hormone called insulin allows glucose to enter cells in the body. The cells use glucose for energy. In type 2 diabetes, one or both of these problems may be present:  The body does not make enough insulin.  The body does not respond properly to insulin that it makes (insulin resistance).  Insulin resistance or lack of insulin causes excess glucose to build up in the blood instead of going into cells. As a result, high blood glucose (hyperglycemia) develops, which can cause many complications. Being overweight or obese and having an inactive (sedentary) lifestyle can increase your risk for diabetes. Type 2 diabetes can be delayed or prevented by making certain nutrition and lifestyle changes. What nutrition changes can be made?  Eat healthy meals and snacks regularly. Keep a healthy snack with you for when you get hungry between meals, such as fruit or a handful of nuts.  Eat lean meats and proteins that are low in saturated fats, such as chicken, fish, egg whites, and beans. Avoid processed meats.  Eat plenty of fruits and vegetables and plenty of grains that have not been processed (whole grains). It is recommended that you eat: ? 1?2 cups of fruit every day. ? 2?3 cups of vegetables every day. ? 6?8 oz of whole grains every day, such as oats, whole wheat, bulgur, brown rice, quinoa, and millet.  Eat low-fat dairy products, such as milk, yogurt, and cheese.  Eat foods that contain healthy fats, such as nuts, avocado, olive oil, and canola oil.  Drink water throughout the day. Avoid drinks that contain added sugar, such as soda or sweet tea.  Follow instructions from your health care provider about specific eating or drinking restrictions.  Control how much food you eat at a time (portion  size). ? Check food labels to find out the serving sizes of foods. ? Use a kitchen scale to weigh amounts of foods.  Saute or steam food instead of frying it. Cook with water or broth instead of oils or butter.  Limit your intake of: ? Salt (sodium). Have no more than 1 tsp (2,400 mg) of sodium a day. If you have heart disease or high blood pressure, have less than ? tsp (1,500 mg) of sodium a day. ? Saturated fat. This is fat that is solid at room temperature, such as butter or fat on meat. What lifestyle changes can be made?  Activity  Do moderate-intensity physical activity for at least 30 minutes on at least 5 days of the week, or as much as told by your health care provider.  Ask your health care provider what activities are safe for you. A mix of physical activities may be best, such as walking, swimming, cycling, and strength training.  Try to add physical activity into your day. For example: ? Park in  spots that are farther away than usual, so that you walk more. For example, park in a far corner of the parking lot when you go to the office or the grocery store. ? Take a walk during your lunch break. ? Use stairs instead of elevators or escalators. Weight Loss  Lose weight as directed. Your health care provider can determine how much weight loss is best for you and can help you lose weight safely.  If you are overweight or obese, you may be instructed to lose at least 5?7 % of your body weight. Alcohol and Tobacco   Limit alcohol intake to no more than 1 drink a day for nonpregnant women and 2 drinks a day for men. One drink equals 12 oz of beer, 5 oz of wine, or 1 oz of hard liquor.  Do not use any tobacco products, such as cigarettes, chewing tobacco, and e-cigarettes. If you need help quitting, ask your health care provider. Work With [place] Provider  Have your blood glucose tested regularly, as told by your health care provider.  Discuss your risk factors  and how you can reduce your risk for diabetes.  Get screening tests as told by your health care provider. You may have screening tests regularly, especially if you have certain risk factors for type 2 diabetes.  Make an appointment with a diet and nutrition specialist (registered dietitian). A registered dietitian can help you make a healthy eating plan and can help you understand portion sizes and food labels. Why are these changes important?  It is possible to prevent or delay type 2 diabetes and related health problems by making lifestyle and nutrition changes.  It can be difficult to recognize signs of type 2 diabetes. The best way to avoid possible damage to your body is to take actions to prevent the disease before you develop symptoms. What can happen if changes are not made?  Your blood glucose levels may keep increasing. Having high blood glucose for a long time is dangerous. Too much glucose in your blood can damage your blood vessels, heart, kidneys, nerves, and eyes.  You may develop prediabetes or type 2 diabetes. Type 2 diabetes can lead to many chronic health problems and complications, such as: ? Heart disease. ? Stroke. ? Blindness. ? Kidney disease. ? Depression. ? Poor circulation in the feet and legs, which could lead to surgical removal (amputation) in severe cases. Where to find support:  Ask your health care provider to recommend a registered dietitian, diabetes educator, or weight loss program.  Look for local or online weight loss groups.  Join a gym, fitness club, or outdoor activity group, such as a walking club. Where to find more information: To learn more about diabetes and diabetes prevention, visit:  American Diabetes Association (ADA): www.diabetes.[employer] of Diabetes and Digestive and Kidney Diseases: FindSpin.nl  To learn more about healthy eating, visit:  The U.S. Department of Agriculture  Scientist, research (physical sciences)), Choose My Plate: http://wiley-williams.com/  Office of Disease Prevention and Health Promotion (ODPHP), Dietary Guidelines: SurferLive.at  Summary  You can reduce your risk for type 2 diabetes by increasing your physical activity, eating healthy foods, and losing weight as directed.  Talk with your health care provider about your risk for type 2 diabetes. Ask about any blood tests or screening tests that you need to have. This information is not intended to replace advice given to you by your health care provider. Make sure you discuss any questions you have  with your health care provider. Document Released: 12/23/2015 Document Revised: 02/06/2016 Document Reviewed: 10/22/2015 Elsevier Interactive Patient Education  [employer].

## 2017-09-03 NOTE — Progress Notes (Addendum)
      [address]       [place] [address]             [phone]        10 Days Post-Op Procedure(s) (LRB): CORONARY ARTERY BYPASS GRAFTING times four using left internal mammary artery and bilateral saphenous vein, using endoscope. TEE (N/A) TRANSESOPHAGEAL ECHOCARDIOGRAM (TEE) (N/A)  Subjective: He states he had a good night.   Objective: Vital signs in last 24 hours: Temp:  [98 F (36.7 C)-98.5 F (36.9 C)] 98.3 F (36.8 C) (12/21 0312) Pulse Rate:  [53-106] 53 (12/21 0312) Cardiac Rhythm: Normal sinus rhythm (12/21 0345) Resp:  [18-27] 26 (12/21 0312) BP: (96-142)/(46-75) 115/56 (12/21 0312) SpO2:  [93 %-99 %] 96 % (12/21 0312) Weight:  [195 lb 11.2 oz (88.8 kg)-198 lb 10.2 oz (90.1 kg)] 195 lb 11.2 oz (88.8 kg) (12/21 0312)  Pre op weight 90.2 kg Current Weight  09/03/17 195 lb 11.2 oz (88.8 kg)      Intake/Output from previous day: 12/20 0701 - 12/21 0700 In: 356 [P.O.:356] Out: 1550 [Urine:1550]   Physical Exam:  Cardiovascular: RRR Pulmonary: Mostly clear Abdomen: Soft, non tender, bowel sounds present. Extremities: Trace bilateral lower extremity edema. Wounds: Clean and dry.  Slight erythema around distal sternal incision. No drainage.  Lab Results: CBC: No results for input(s): WBC, HGB, HCT, PLT in the last 72 hours. BMET:  Recent Labs    09/02/17 0602  NA 139  K 4.1  CL 105  CO2 27  GLUCOSE 134*  BUN 32*  CREATININE 1.34*  CALCIUM 8.3*    PT/INR:  Lab Results  Component Value Date   INR 1.39 08/24/2017   INR 1.01 08/20/2017   INR 1.0 07/27/2017   ABG:  INR: Will add last result for INR, ABG once components are confirmed Will add last 4 CBG results once components are confirmed  Assessment/Plan: 1. CV-Previous a fib with RVR. Put back on Amiodarone the night of 12/18 as went back into a fib with RVR. Maintaining SR since yesterday morning.s continue Amiodarone 200 mg bid, Lopressor 12.5 mg bid. Hopefully,  will remain in SR so can avoid anticoagulation. 2. Pulmonary-Has COPD, ? history of elevated right hemidaphragm. On room air. Xopenex PRN. Encourage incentive spirometer and flutter valve. 3. Volume overload-on Lasix 40 mg daily 4. ABL anemia-last H and H  stable at 9 and 28 5. BPH-on Proscar and Flomax 6. Statin intolerant so not on-will defer to cardiology after discharge  7. AKI (stage I)-last creatinine 1.34 (creatinine upon admission 0.9) 8. Hope to discharge as soon as SNF bed available.  Donielle M ZimmermanPA-C 09/03/2017,7:09 AM  Overall patient feels better, notes that he walked better today he is concerned about Care when he goes home, considering rehab at SNF I have seen and examined Tim [name] and agree with the above assessment  and plan.  Grace Isaac MD [place] [phone] Office [phone] 09/03/2017 8:39 AM

## 2017-09-03 NOTE — Progress Notes (Signed)
CARDIAC REHAB PHASE I   PRE:  Rate/Rhythm: 95 SR    BP: sitting 103/58    SaO2: 94 RA  MODE:  Ambulation: 300 ft   POST:  Rate/Rhythm: 117 ST    BP: sitting 127/57     SaO2: 97 RA  Pt stood and ambulated with RW initially then rollator. Sat x1 for fatigue. His "foggy" head feeling was not as noticeable with his glasses on. Overall tolerated better but pt struggles to see this. Return to recliner. HR still elevated today.   Ed completed with pt and son and daughter in Sports coach. Good reception. Pt feels much better, now seeing that he is progressing. Eager to go to SNF. Will send referral to [facility] [address] [id number], [place], ACSM 09/03/2017 10:56 AM

## 2017-09-03 NOTE — Clinical Social Work Note (Signed)
Clinical Social Worker facilitated patient discharge including contacting patient family and facility to confirm patient discharge plans.  Clinical information faxed to facility and family agreeable with plan.  CSW arranged ambulance transport via [facility] to [facility] .  RN to call [phone] for report prior to discharge.  Clinical Social Worker will sign off for now as social work intervention is no longer needed. Please consult [place] again if new need arises.  [place], [place]

## 2017-09-03 NOTE — [place] FL2 (Signed)
[place] MEDICAID FL2 LEVEL OF CARE SCREENING TOOL     IDENTIFICATION  Patient Name: Tim [name] Birthdate: [birth year] Sex: male Admission Date (Current Location): 08/24/2017  [facility] and [place] Number:  Herbalist and Address:  The . [facility], [place] [address] [place], [place] [address]      Provider Number: [id number]  Attending Physician Name and Address:  Grace Isaac, MD  Relative Name and Phone Number:  Anne Fu, daughter, [phone]    Current Level of Care: Hospital Recommended Level of Care: [place] Prior Approval Number:    Date Approved/Denied:   PASRR Number: [id number]  Discharge Plan: SNF    Current Diagnoses: Patient Active Problem List   Diagnosis Date Noted  . Coronary artery disease 08/24/2017  . Unstable angina ([place]) 07/29/2017  . CAP (community acquired pneumonia) 02/05/2017  . Solitary pulmonary nodule 02/06/2016  . Chronic rhinitis 02/01/2013  . Low back pain   . CAD (coronary artery disease)   . GERD (gastroesophageal reflux disease)   . Cervical disc disease   . BPH (benign prostatic hypertrophy)   . Paralyzed hemidiaphragm   . Statin intolerance   . Carotid artery disease ([place])   . Elevated CPK   . Fatigue   . Ejection fraction   . LEG CRAMPS 11/21/2010  . Hyperlipidemia 11/01/2008  . HYPERTENSION, BENIGN 11/01/2008  . COPD (chronic obstructive pulmonary disease) ([place]) 07/14/2007  . DOE (dyspnea on exertion) 07/14/2007    Orientation RESPIRATION BLADDER Height & Weight     Self, Time, Situation, Place  Normal Incontinent, External catheter Weight: 195 lb 11.2 oz (88.8 kg) Height:  5\' 11"  (180.3 cm)  BEHAVIORAL SYMPTOMS/MOOD NEUROLOGICAL BOWEL NUTRITION STATUS      Incontinent Diet(heart healthy/carb modified; please see DC summary)  AMBULATORY STATUS COMMUNICATION OF NEEDS Skin   Limited Assist Verbally Surgical wounds(closed incisions on leg and chest)                        Personal Care Assistance Level of Assistance  Bathing, Feeding, Dressing Bathing Assistance: Limited assistance Feeding assistance: Independent Dressing Assistance: Limited assistance     Functional Limitations Info  Sight, Hearing, Speech Sight Info: Adequate Hearing Info: Impaired Speech Info: Adequate    SPECIAL CARE FACTORS FREQUENCY  PT (By licensed PT)     PT Frequency: 5x/week              Contractures Contractures Info: Not present    Additional Factors Info  Allergies, Code Status Code Status Info: Full Allergies Info: Statins           Current Medications (09/03/2017):  This is the current hospital active medication list Current Facility-Administered Medications  Medication Dose Route Frequency Provider Last Rate Last Dose  . 0.9 %  sodium chloride infusion  250 mL Intravenous PRN Grace Isaac, MD      . alum & mag hydroxide-simeth (MAALOX/MYLANTA) 200-200-20 MG/5ML suspension 15 mL  15 mL Oral Q4H PRN Grace Isaac, MD      . amiodarone (PACERONE) tablet 200 mg  200 mg Oral BID Ivin Poot, MD   200 mg at 09/02/17 2154  . aspirin EC tablet 81 mg  81 mg Oral Daily Grace Isaac, MD   81 mg at 09/02/17 3419  . azelastine (ASTELIN) 0.1 % nasal spray 2 spray  2 spray Each Nare BID Lars Pinks M, PA-C   2 spray at  09/02/17 2153  . bisacodyl (DULCOLAX) EC tablet 10 mg  10 mg Oral Daily PRN Grace Isaac, MD   10 mg at 08/30/17 5809   Or  . bisacodyl (DULCOLAX) suppository 10 mg  10 mg Rectal Daily PRN Grace Isaac, MD      . bismuth subsalicylate (PEPTO BISMOL) chewable tablet 524 mg  524 mg Oral PRN Ivin Poot, MD      . Chlorhexidine Gluconate Cloth 2 % PADS 6 each  6 each Topical Daily Grace Isaac, MD   6 each at 09/01/17 [phone]  . enoxaparin (LOVENOX) injection 30 mg  30 mg Subcutaneous Q24H Grace Isaac, MD   30 mg at 09/02/17 2153  . feeding supplement (GLUCERNA SHAKE) (GLUCERNA  SHAKE) liquid 237 mL  237 mL Oral TID WC Lars Pinks M, PA-C   237 mL at 09/03/17 0809  . finasteride (PROSCAR) tablet 5 mg  5 mg Oral Daily Lars Pinks M, PA-C   5 mg at 09/02/17 8250  . fluticasone furoate-vilanterol (BREO ELLIPTA) 100-25 MCG/INH 1 puff  1 puff Inhalation Daily Lars Pinks M, PA-C   1 puff at 09/02/17 5397  . furosemide (LASIX) tablet 40 mg  40 mg Oral Daily Grace Isaac, MD   40 mg at 09/02/17 0948  . guaiFENesin (MUCINEX) 12 hr tablet 600 mg  600 mg Oral BID Ivin Poot, MD   600 mg at 09/02/17 2154  . levalbuterol (XOPENEX) nebulizer solution 0.63 mg  0.63 mg Nebulization Q4H PRN Grace Isaac, MD   0.63 mg at 08/31/17 0044  . MEDLINE mouth rinse  15 mL Mouth Rinse BID Grace Isaac, MD   15 mL at 09/02/17 0951  . metoprolol tartrate (LOPRESSOR) tablet 12.5 mg  12.5 mg Oral BID Lars Pinks M, PA-C   12.5 mg at 09/02/17 2154  . ondansetron (ZOFRAN) tablet 4 mg  4 mg Oral Q6H PRN Grace Isaac, MD       Or  . ondansetron [facility]) injection 4 mg  4 mg Intravenous Q6H PRN Grace Isaac, MD      . pantoprazole (PROTONIX) EC tablet 40 mg  40 mg Oral QAC breakfast Grace Isaac, MD   40 mg at 09/03/17 0809  . potassium chloride SA (K-DUR,KLOR-CON) CR tablet 20 mEq  20 mEq Oral Daily Grace Isaac, MD   20 mEq at 09/02/17 0949  . sodium chloride flush (NS) 0.9 % injection 3 mL  3 mL Intravenous Q12H Grace Isaac, MD   3 mL at 09/02/17 2154  . sodium chloride flush (NS) 0.9 % injection 3 mL  3 mL Intravenous PRN Grace Isaac, MD      . tamsulosin [facility]) capsule 0.4 mg  0.4 mg Oral BID Lars Pinks M, PA-C   0.4 mg at 09/02/17 2154  . traMADol (ULTRAM) tablet 50 mg  50 mg Oral Q6H PRN Grace Isaac, MD   50 mg at 09/02/17 2154     Discharge Medications: Please see discharge summary for a list of discharge medications.  Relevant Imaging Results:  Relevant Lab Results:   Additional  Information SSN: [id number]  Estanislado Emms, LCSW

## 2017-09-10 ENCOUNTER — Telehealth: Payer: Self-pay | Admitting: Cardiology

## 2017-09-10 NOTE — Telephone Encounter (Signed)
Close encounter 

## 2017-09-10 NOTE — Telephone Encounter (Signed)
Patient's daughter [name] [facility] on file) calling and wanting to reschedule patient's appointment on 09/15/16. She states that the patient is in a rehab facility and is expected to be discharged in about 2 weeks. Daughter requesting appointment be changed to the week of 14th. She states that the patient is not having any chest pain, SOB, or any other problems.  Appointment rescheduled to 09/28/17 at 2:30 PM. Daughter verbalized understanding and thanked me for the call.

## 2017-09-10 NOTE — Telephone Encounter (Signed)
Patient Daughter, [name] calling, states that patient is at a nursing home getting rehab. She would like to know if patient needs to keep appt or if it could wait until patient is finished with rehab?

## 2017-09-15 ENCOUNTER — Ambulatory Visit: Payer: Medicare Other | Admitting: Cardiology

## 2017-09-17 MED FILL — Electrolyte-R (PH 7.4) Solution: INTRAVENOUS | Qty: 3000 | Status: AC

## 2017-09-17 MED FILL — Mannitol IV Soln 20%: INTRAVENOUS | Qty: 500 | Status: AC

## 2017-09-17 MED FILL — Sodium Chloride IV Soln 0.9%: INTRAVENOUS | Qty: 2000 | Status: AC

## 2017-09-17 MED FILL — Heparin Sodium (Porcine) Inj 1000 Unit/ML: INTRAMUSCULAR | Qty: 30 | Status: AC

## 2017-09-17 MED FILL — Sodium Bicarbonate IV Soln 8.4%: INTRAVENOUS | Qty: 50 | Status: AC

## 2017-09-17 MED FILL — Lidocaine HCl IV Inj 20 MG/ML: INTRAVENOUS | Qty: 5 | Status: AC

## 2017-09-28 ENCOUNTER — Ambulatory Visit (INDEPENDENT_AMBULATORY_CARE_PROVIDER_SITE_OTHER): Payer: Medicare Other | Admitting: Pulmonary Disease

## 2017-09-28 ENCOUNTER — Encounter: Payer: Self-pay | Admitting: Cardiology

## 2017-09-28 ENCOUNTER — Encounter: Payer: Self-pay | Admitting: Pulmonary Disease

## 2017-09-28 ENCOUNTER — Ambulatory Visit: Payer: Medicare Other | Admitting: Cardiology

## 2017-09-28 ENCOUNTER — Ambulatory Visit (INDEPENDENT_AMBULATORY_CARE_PROVIDER_SITE_OTHER)
Admission: RE | Admit: 2017-09-28 | Discharge: 2017-09-28 | Disposition: A | Discharge status: Home / Self Care | Payer: Medicare Other | Source: Ambulatory Visit | Attending: Pulmonary Disease | Admitting: Pulmonary Disease

## 2017-09-28 ENCOUNTER — Other Ambulatory Visit (INDEPENDENT_AMBULATORY_CARE_PROVIDER_SITE_OTHER): Payer: Medicare Other

## 2017-09-28 VITALS — BP 122/66 | HR 114 | Ht 71.0 in | Wt 198.0 lb

## 2017-09-28 VITALS — BP 126/62 | HR 105 | Ht 71.0 in | Wt 198.8 lb

## 2017-09-28 DIAGNOSIS — K219 Gastro-esophageal reflux disease without esophagitis: Secondary | ICD-10-CM

## 2017-09-28 DIAGNOSIS — J410 Simple chronic bronchitis: Secondary | ICD-10-CM

## 2017-09-28 DIAGNOSIS — D500 Iron deficiency anemia secondary to blood loss (chronic): Secondary | ICD-10-CM | POA: Diagnosis not present

## 2017-09-28 DIAGNOSIS — N179 Acute kidney failure, unspecified: Secondary | ICD-10-CM

## 2017-09-28 DIAGNOSIS — R911 Solitary pulmonary nodule: Secondary | ICD-10-CM

## 2017-09-28 DIAGNOSIS — Z951 Presence of aortocoronary bypass graft: Secondary | ICD-10-CM

## 2017-09-28 DIAGNOSIS — E876 Hypokalemia: Secondary | ICD-10-CM

## 2017-09-28 DIAGNOSIS — I480 Paroxysmal atrial fibrillation: Secondary | ICD-10-CM

## 2017-09-28 DIAGNOSIS — R0600 Dyspnea, unspecified: Secondary | ICD-10-CM

## 2017-09-28 DIAGNOSIS — J45909 Unspecified asthma, uncomplicated: Secondary | ICD-10-CM | POA: Diagnosis not present

## 2017-09-28 DIAGNOSIS — Z789 Other specified health status: Secondary | ICD-10-CM | POA: Diagnosis not present

## 2017-09-28 DIAGNOSIS — R609 Edema, unspecified: Secondary | ICD-10-CM

## 2017-09-28 DIAGNOSIS — I2510 Atherosclerotic heart disease of native coronary artery without angina pectoris: Secondary | ICD-10-CM

## 2017-09-28 DIAGNOSIS — J301 Allergic rhinitis due to pollen: Secondary | ICD-10-CM | POA: Diagnosis not present

## 2017-09-28 DIAGNOSIS — E7800 Pure hypercholesterolemia, unspecified: Secondary | ICD-10-CM

## 2017-09-28 LAB — CBC WITH DIFFERENTIAL/PLATELET
Basophils Absolute: 0 10*3/uL (ref 0.0–0.1)
Basophils Relative: 0.3 % (ref 0.0–3.0)
Eosinophils Absolute: 0.2 10*3/uL (ref 0.0–0.7)
Eosinophils Relative: 1.8 % (ref 0.0–5.0)
HCT: 28.6 % — ABNORMAL LOW (ref 39.0–52.0)
Hemoglobin: 9.2 g/dL — ABNORMAL LOW (ref 13.0–17.0)
Lymphocytes Relative: 12.4 % (ref 12.0–46.0)
Lymphs Abs: 1.3 10*3/uL (ref 0.7–4.0)
MCHC: 32 g/dL (ref 30.0–36.0)
MCV: 86.3 fl (ref 78.0–100.0)
Monocytes Absolute: 0.6 10*3/uL (ref 0.1–1.0)
Monocytes Relative: 6.4 % (ref 3.0–12.0)
Neutro Abs: 8 10*3/uL — ABNORMAL HIGH (ref 1.4–7.7)
Neutrophils Relative %: 79.1 % — ABNORMAL HIGH (ref 43.0–77.0)
Platelets: 324 10*3/uL (ref 150.0–400.0)
RBC: 3.32 Mil/uL — ABNORMAL LOW (ref 4.22–5.81)
RDW: 16.1 % — ABNORMAL HIGH (ref 11.5–15.5)
WBC: 10.1 10*3/uL (ref 4.0–10.5)

## 2017-09-28 LAB — NITRIC OXIDE: Nitric Oxide: 22

## 2017-09-28 MED ORDER — POTASSIUM CHLORIDE CRYS ER 20 MEQ PO TBCR: EXTENDED_RELEASE_TABLET | ORAL | 1 refills | Status: DC

## 2017-09-28 MED ORDER — OMEPRAZOLE 40 MG PO CPDR: 40.0000 mg | DELAYED_RELEASE_CAPSULE | Freq: Two times a day (BID) | ORAL | 5 refills | Status: DC

## 2017-09-28 MED ORDER — MONTELUKAST SODIUM 10 MG PO TABS: 10.0000 mg | ORAL_TABLET | Freq: Every day | ORAL | 11 refills | Status: DC

## 2017-09-28 MED ORDER — FUROSEMIDE 40 MG PO TABS: ORAL_TABLET | ORAL | 1 refills | Status: DC

## 2017-09-28 NOTE — Progress Notes (Addendum)
Cardiology Office Note   Date:  09/28/2017   ID:  Tim [name], DOB 10/18/1928, MRN [id number]  PCP:  Raelene Bott, MD  Cardiologist:  Dr. Magdalen Spatz    Chief Complaint  Patient presents with  . Hospitalization Follow-up      History of Present Illness: Tim [name] is a 84 y.o. male who presents for post hospitalization for CABG.    He has a hx of bilateral carotid artery disease,, coronary artery disease status post STEMI in 2001 with PCI to RCA, residual 100% circumflex, subsequent PCI for in-stent restenosis in June 2001, nuclear in 2014 with no scar, no ischemia 62%. He has had trouble with statins in the past, elevated CPK in the past. Former smoker quit in 1980.  When seen in Nov 2018 he had angina and cardiac cath was arranged.  Cath with significant three-vessel coronary artery disease, including sequential 70% and 50% proximal/mid LAD stenoses as well as chronic total occlusion of the proximal/mid LCx and distal RCA., Moderate to severe in-stent restenosis in the mid/distal RCA.  Pt then was evaluated by Dr. Servando Snare and pulmonary with Dr. Lake Bells for COPD eval for surgery.  He has paralyzed Rt hemidiaphragm.     He underwent CABG X 4 08/24/17  with LIMA to LAD, VG to diagonal, VG to LCX, VG to PDA  Discharged 09/03/17  He did have post op PAF with RVR on amiodarone and dilt drips  He has peaked T waves post op most likely pericarditis. He did convert to SR and discharged on atenolol but developed wheezing so changed to lopressor.    His diabetes was controlled.  Discharged on amiodarone daily 200 mg and BB.    Today he presents with SOB and edema.  No chest pain. + wheezes, and has seen Dr. Lake Bells today. He felt wheezes related to Reflux so PPI increase to BID.  Singulair added  And his PCP wanted to increase lasix.    He has no fevers, his appetite is good, does not add salt, is exercising by walking.  Is out of SNF and now PT is coming to his home.  He will discuss  with [facility] if he could continue home PT or go to cardiac rehab.   He does have DOE.  His lasix has been prn.  No awareness of rapid heart rate.  Does have edema and some drainage of vein harvesting site.    Past Medical History:  Diagnosis Date  . AAA (abdominal aortic aneurysm) (HCC)    3.2 cm 07/2016; 2-3 year f/u recommended  . Asthma   . BPH (benign prostatic hypertrophy)   . CAD (coronary artery disease)    9 STEMI 2001, PCI, RCA, residual 100% circumflex  /   PCI for in-stent restenosis June, 2001  /  nuclear February, 2010, no ischemia  . Cancer ([place])    skin cancer removed  . Carotid artery disease ([place])    Doppler, June, 2011, 0-39% bilateral, pt denies  . Cervical disc disease    Status post neurosurgery  . Colon polyps   . Ejection fraction    EF 55-65%, echo, 2009  . Elevated CPK    Chronic mild CPK elevation  . Emphysema    Mild  . Fatigue    Morning fatigue, August, 2011  . GERD (gastroesophageal reflux disease)   . Headache    back of head  . Hyperlipidemia   . Hypertension   . Low back pain  February, 2013  . Myocardial infarction ([place])   . Paralyzed hemidiaphragm    Right  . Paralyzed hemidiaphragm    Chronic  . Statin intolerance    Elevated CPK in the past    Past Surgical History:  Procedure Laterality Date  . BREAST SURGERY     right breast removed  . CERVICAL DISC SURGERY    . CORONARY ARTERY BYPASS GRAFT N/A 08/24/2017   Procedure: CORONARY ARTERY BYPASS GRAFTING times four using left internal mammary artery and bilateral saphenous vein, using endoscope. TEE;  Surgeon: Grace Isaac, MD;  Location: [place];  Service: Open Heart Surgery;  Laterality: N/A;  . LEFT HEART CATH AND CORONARY ANGIOGRAPHY N/A 07/29/2017   Procedure: LEFT HEART CATH AND CORONARY ANGIOGRAPHY;  Surgeon: Nelva Bush, MD;  Location: [place] [facility];  Service: Cardiovascular;  Laterality: N/A;  . SKIN CANCER EXCISION    . TEE WITHOUT CARDIOVERSION N/A  08/24/2017   Procedure: TRANSESOPHAGEAL ECHOCARDIOGRAM (TEE);  Surgeon: Grace Isaac, MD;  Location: [facility];  Service: Open Heart Surgery;  Laterality: N/A;     Current Outpatient Medications  Medication Sig Dispense Refill  . acetaminophen (TYLENOL) 500 MG tablet Take 500 mg by mouth every 8 (eight) hours as needed (for pain.).    [name] albuterol (PROVENTIL HFA;VENTOLIN HFA) 108 (90 BASE) MCG/ACT inhaler Inhale 2 puffs into the lungs every 6 (six) hours as needed for wheezing or shortness of breath. 1 Inhaler 6  . albuterol (PROVENTIL) (2.5 MG/3ML) 0.083% nebulizer solution Take 3 mLs (2.5 mg total) by nebulization every 6 (six) hours as needed for wheezing or shortness of breath. 75 mL 12  . amiodarone (PACERONE) 200 MG tablet Take 1 tablet (200 mg total) by mouth daily.    [name] aspirin EC 81 MG tablet Take 81 mg by mouth daily.    [name] doxycycline (PERIOSTAT) 20 MG tablet Take 20 mg by mouth daily.    . finasteride (PROSCAR) 5 MG tablet Take 5 mg by mouth 2 (two) times daily.     . fluticasone furoate-vilanterol (BREO ELLIPTA) 100-25 MCG/INH AEPB Inhale 1 puff into the lungs daily. 1 each 0  . metoprolol tartrate (LOPRESSOR) 25 MG tablet Take 0.5 tablets (12.5 mg total) by mouth 2 (two) times daily.    . montelukast (SINGULAIR) 10 MG tablet Take 10 mg by mouth at bedtime.    . Multiple Vitamins-Minerals (ICAPS PO) Take 1 capsule by mouth daily.    [name] omeprazole (PRILOSEC) 40 MG capsule Take 1 capsule (40 mg total) by mouth 2 (two) times daily. 60 capsule 5  . tamsulosin (FLOMAX) 0.4 MG CAPS capsule Take 0.4 mg by mouth 2 (two) times daily. For urinary symptoms     . furosemide (LASIX) 40 MG tablet Take 1 tablet by mouth at 5 a.m, take 1 tablet by mouth at lunch time 60 tablet 1  . potassium chloride SA (K-DUR,KLOR-CON) 20 MEQ tablet Take 1 tablet by mouth at 5 a.m, take 1/2 tablet by mouth at lunch time 45 tablet 1   No current facility-administered medications for this visit.     Allergies:    Statins    Social History:  The patient  reports that he quit smoking about 42 years ago. His smoking use included cigarettes. He has a 120.00 pack-year smoking history. he has never used smokeless tobacco. He reports that he does not drink alcohol or use drugs.   Family History:  The patient's family history includes Cancer in his brother;  Diabetes in his mother and sister; Kidney disease in his brother; Other in his father and sister.    ROS:  General:no colds or fevers, no weight changes Skin:no rashes or ulcers HEENT:no blurred vision, no congestion CV:see HPI PUL:see HPI GI:no diarrhea constipation or melena, no indigestion GU:no hematuria, no dysuria MS:no joint pain, no claudication Neuro:no syncope, no lightheadedness Endo:no diabetes, no thyroid disease  Wt Readings from Last 3 Encounters:  09/28/17 198 lb 12.8 oz (90.2 kg)  09/28/17 198 lb (89.8 kg)  09/03/17 195 lb 11.2 oz (88.8 kg)     PHYSICAL EXAM: VS:  BP 126/62   Pulse (!) 105   Ht 5\' 11"  (1.803 m)   Wt 198 lb 12.8 oz (90.2 kg)   SpO2 95%   BMI 27.73 kg/m  , BMI Body mass index is 27.73 kg/m. General:Pleasant affect, NAD Skin:Warm and dry, brisk capillary refill HEENT:normocephalic, sclera clear, mucus membranes moist Neck:supple, + JVD, no bruits  Heart:S1S2 RRR without murmur, gallup, rub or click, chest incision well healed mild redness at bottom portion.  No drainage.  WBC today is 10 K  Lungs: with rales in bases, no rhonchi, or wheezes CZY:SAYT, non tender, + BS, do not palpate liver spleen or masses Ext:2-3+ edema of lower ext to knees , 2+ pedal pulses, 2+ radial pulses Neuro:alert and oriented X 3, MAE, follows commands, + facial symmetry    EKG:  EKG is ordered today. The ekg ordered today demonstrates ST at 109 flipped T waves in V5-6 and I, new but pt is post CABG.    Recent Labs: 08/20/2017: ALT 18 08/25/2017: Magnesium 2.5 08/31/2017: TSH 2.942 09/02/2017: BUN 32; Creatinine, Ser  1.34; Potassium 4.1; Sodium 139 09/28/2017: Hemoglobin 9.2; Platelets 324.0    Lipid Panel No results found for: CHOL, TRIG, HDL, CHOLHDL, VLDL, LDLCALC, LDLDIRECT     Other studies Reviewed: Additional studies/ records that were reviewed today include:   Cardiac cath 07/29/17 .Conclusion   Conclusions: 1. Significant three-vessel coronary artery disease, including sequential 70% and 50% proximal/mid LAD stenoses as well as chronic total occlusion of the proximal/mid LCx and distal RCA. 2. Moderate to severe in-stent restenosis in the mid/distal RCA. 3. Normal left ventricular contraction and filling pressure.  Recommendations: 1. Cardiac surgery consultation, given significant three vessel coronary artery disease. OM3 and rPDA are supplied by collaterals. 2. If patient is not a surgical candidate, PCI to proximal/mid LAD could be considered, with continued medical management of chronic total occlusions of the LCx and RCA. 3. Aggressive secondary prevention. Consider retrial of statin versus evaluation for PCSK9 inhibitor therapy. 4. Continue atenolol and isosorbide mononitrate, to be uptitrated as tolerated.                   ECHO 08/20/17 Study Conclusions  - Left ventricle: The cavity size was normal. Systolic function was   normal. The estimated ejection fraction was in the range of 55%   to 60%. Probable hypokinesis of the basal-midinferolateral and   inferior myocardium; consistent with ischemia in the distribution   of the right coronary or left circumflex coronary artery. Doppler   parameters are consistent with abnormal left ventricular   relaxation (grade 1 diastolic dysfunction). - Left atrium: The atrium was mildly dilated.  Vas dopplers 08/20/17 Final Interpretation: Right Carotid: There is evidence in the right ICA of a 1-39% stenosis.  Left Carotid: There is evidence in the left ICA of a 40-59% stenosis. Vertebrals: Both vertebral arteries were patent with  antegrade flow. Subclavians:  Right ABI: Resting right ankle-brachial index is within normal range. No evidence of significant right lower extremity arterial disease. Left ABI: Resting left ankle-brachial index indicates mild right lower extremity arterial disease. Right Upper Extremity: No significant arterial obstruction detected in the right upper extremity. Doppler waveform obliterate with compression. Doppler waveforms remain within normal limits with compression. Left Upper Extremity: No significant arterial obstruction detected in the left upper extremity. Doppler waveform obliterate with compression. Doppler waveforms decrease <50% w/compression.   CABG 08/24/17 CORONARY ARTERY BYPASS GRAFTING x 4 (LIMA to LAD, SVG to DIAGONAL, SVG to CIRCUMFLEX, SVG to PDA) using left internal mammary artery and bilateral greater saphenous vein via EVH     ASSESSMENT AND PLAN:  1.  CAD - severe with unstable angina and underwent CABF X 4  (LIMA to LAD, SVG to DIAGONAL, SVG to CIRCUMFLEX, SVG to PDA) using left internal mammary artery and bilateral greater saphenous vein via EVH.  On asa, BB, statin intolerance  Will follow up in 2 weeks with myself or Dr. Marlou Porch.    2.  PAF post op on amiodarone 200 mg daily.  Continue   3.  HLD statin intolerant  With elevated CPK in the past.   4.  ABL anemia post op,  CBC today with hgb 9.2   5.  COPD followed by pulmonary --did not tolerate atenolol in hospital  6.  AKI will recheck BMP  7.  Volume overload, will give lasix 40 mg BID and Kdur 30 meq per day.  Check BMP today and again in 1 week   8.  GERD with increase of PPI.    9. Chest incision if redness increases please call or any fever.     Current medicines are reviewed with the patient today.  The patient Has no concerns regarding medicines.  The following changes have been made:  See above Labs/ tests ordered today include:see above  Disposition:   FU:  see above  Signed, Cecilie Kicks, NP  09/28/2017 [facility] [place], [place], [place] Phone: [phone]; Fax: [phone]

## 2017-09-28 NOTE — Progress Notes (Signed)
Subjective:    Patient ID: Tim [name], male    DOB: [birth year], 84 y.o.   MRN: [id number]  Synopsis: Former patient of Dr. Gwenette [name] with COPD and a paralyzed R Hemidiaphragm as seen on fluoroscopy in 2007. Lung function testing in 2005 showed clear airflow obstruction, FEV1 of 1.53 L (43% predicted), total lung capacity 62% predicted, DLCO 73% predicted. He smoked 2-3 packs per day for 40 years, quit around 1980.  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    HPI Chief Complaint  Patient presents with  . Follow-up    pt had open heart sx since last OV.  c/o increased sob with any exertion, wheezing worse when laying down, prod cough with white mucus.    [name] says that he is not quite "up to par" but he is doing fairly well considering the recent CABG. He has been taking the [facility] lately.  He still has a cough and wheeze when he lay flat.  This was present prior to surgery, but now worse.  He takes an acid reflux pill bid.  He is having some tightness and breathlessness since surgery, but this is slowly improving.  His oxygen is improving.  He is doing cardiac rehab.  He still has some trouble breathing with walking.  He has his albuterol, this helps, he is using as much as 6 doses a day between the neb and HFA.  He someitmes brings up mucus, sometimes worse than others.  He says this will be worse in the morning after lying down all night.  His cough has improved.       Past Medical History:  Diagnosis Date  . AAA (abdominal aortic aneurysm) (HCC)    3.2 cm 07/2016; 2-3 year f/u recommended  . Asthma   . BPH (benign prostatic hypertrophy)   . CAD (coronary artery disease)    9 STEMI 2001, PCI, RCA, residual 100% circumflex  /   PCI for in-stent restenosis June, 2001  /  nuclear February, 2010, no ischemia  . Cancer ([place])    skin cancer removed  . Carotid artery disease ([place])    Doppler, June, 2011, 0-39% bilateral, pt denies  . Cervical disc  disease    Status post neurosurgery  . Colon polyps   . Ejection fraction    EF 55-65%, echo, 2009  . Elevated CPK    Chronic mild CPK elevation  . Emphysema    Mild  . Fatigue    Morning fatigue, August, 2011  . GERD (gastroesophageal reflux disease)   . Headache    back of head  . Hyperlipidemia   . Hypertension   . Low back pain    February, 2013  . Myocardial infarction ([place])   . Paralyzed hemidiaphragm    Right  . Paralyzed hemidiaphragm    Chronic  . Statin intolerance    Elevated CPK in the past      Review of Systems  Constitutional: Negative for fatigue and fever.  HENT: Negative for postnasal drip and rhinorrhea.   Respiratory: Positive for cough. Negative for shortness of breath and wheezing.   Cardiovascular: Negative for chest pain and leg swelling.       Objective:   Physical Exam Vitals:   09/28/17 1143  BP: 122/66  Pulse: (!) 114  SpO2: 98%  Weight: 198 lb (89.8 kg)  Height: 5\' 11"  (1.803 m)   RA  Gen: well appearing HENT: OP clear, TM's  clear, neck supple PULM: Lower airway wheezing on exam, normal air movement, normal percussion CV: RRR, no mgr, trace edema GI: BS+, soft, nontender Derm: no cyanosis or rash Psyche: normal mood and affect   Chest imaging 01/2016 CXR > granuloma left upper lobe November 2018 CT chest: Less than 4 mm solid pleural-based right upper lobe nodule noted, groundglass nodule 11.9 mm right upper lobe, centrilobular emphysema noted, nonspecific scarring in the bases.  Images independently reviewed by me  Labs: CBC    Component Value Date/Time   WBC 9.9 08/31/2017 0429   RBC 3.15 (L) 08/31/2017 0429   HGB 9.0 (L) 08/31/2017 0429   HGB 13.0 07/27/2017 1020   HCT 28.0 (L) 08/31/2017 0429   HCT 38.3 07/27/2017 1020   PLT 301 08/31/2017 0429   PLT 269 07/27/2017 1020   MCV 88.9 08/31/2017 0429   MCV 87 07/27/2017 1020   MCH 28.6 08/31/2017 0429   MCHC 32.1 08/31/2017 0429   RDW 15.5 08/31/2017 0429   RDW  14.7 07/27/2017 1020   LYMPHSABS 2.6 08/16/2017 1024   MONOABS 1.3 (H) 08/16/2017 1024   EOSABS 0.3 08/16/2017 1024   BASOSABS 0.0 08/16/2017 1024   08/2017 IgE normal  Records reviewed, he had CABG x4 on December 11 by Dr. Servando [name]      Assessment & Plan:   Dyspnea, unspecified type - Plan: DG Chest 2 View  Uncomplicated asthma, unspecified asthma severity, unspecified whether persistent - Plan: Nitric oxide, CBC w/Diff, IgE  Solitary pulmonary nodule  Allergic rhinitis due to pollen, unspecified seasonality  Gastroesophageal reflux disease, esophagitis presence not specified  Discussion: Tim [name] reports worsening wheezing and some cough since surgery.  I think in part this is due to poor control of acid reflux as his symptoms are worse while lying flat.  However, with his shortness of breath and wheezing on physical exam I think this also represents poor asthma control.  Gastroesophageal reflux disease can make asthma control worse so we will increase his PPI.  We will also add Singulair.  I will repeat blood work and and exhaled nitric oxide test to look for evidence of eosinophilic asthma.  He may need to be treated with an injectable biologic agent for his asthma if we do not see improved symptom control with this regimen.  The differential diagnosis of his shortness of breath post cardiac surgery includes a pleural effusion and general deconditioning.  We will get a chest x-ray.  Wheezing: I think this is due to poor asthma control as well as gastroesophageal reflux disease We will check an exhaled nitric oxide test, blood test to look for inflammation: CBC with differential, serum IgE Add Singulair 10 mg daily  Gastroesophageal reflux disease: We will increase omeprazole to 40 mg twice a day  COPD with asthma: As listed above, we are going to add Singulair Continue Brio daily Use albuterol as needed for chest tightness wheezing or shortness of breath  Shortness of  breath: We will check a chest x-ray  Pulmonary nodule seen in November 2018: We will repeat a CT chest in February  We will see you back in February   Current Outpatient Medications:  .  acetaminophen (TYLENOL) 500 MG tablet, Take 500 mg by mouth every 8 (eight) hours as needed (for pain.)., Disp: , Rfl:  .  albuterol (PROVENTIL HFA;VENTOLIN HFA) 108 (90 BASE) MCG/ACT inhaler, Inhale 2 puffs into the lungs every 6 (six) hours as needed for wheezing or shortness of breath., Disp: 1 Inhaler,  Rfl: 6 .  albuterol (PROVENTIL) (2.5 MG/3ML) 0.083% nebulizer solution, Take 3 mLs (2.5 mg total) by nebulization every 6 (six) hours as needed for wheezing or shortness of breath., Disp: 75 mL, Rfl: 12 .  amiodarone (PACERONE) 200 MG tablet, Take 1 tablet (200 mg total) by mouth daily., Disp: , Rfl:  .  aspirin EC 81 MG tablet, Take 81 mg by mouth daily., Disp: , Rfl:  .  azelastine (ASTELIN) 0.1 % nasal spray, Place 2 sprays into both nostrils 2 (two) times daily. Use in each nostril as directed, Disp: , Rfl:  .  Besifloxacin HCl (BESIVANCE) 0.6 % SUSP, Place 1 drop into the left eye See admin instructions. INSTILL 1 DROP INTO LEFT EYE FOUR TIMES DAILY FOR 2 DAYS AFTER EACH MONTHLY EYE INJECTION, Disp: , Rfl:  .  doxycycline (PERIOSTAT) 20 MG tablet, Take 20 mg by mouth daily., Disp: , Rfl:  .  feeding supplement, GLUCERNA SHAKE, (GLUCERNA SHAKE) LIQD, Take 237 mLs by mouth 3 (three) times daily with meals., Disp: , Rfl: 0 .  finasteride (PROSCAR) 5 MG tablet, Take 5 mg by mouth 2 (two) times daily. , Disp: , Rfl:  .  fluticasone furoate-vilanterol (BREO ELLIPTA) 100-25 MCG/INH AEPB, Inhale 1 puff into the lungs daily., Disp: 1 each, Rfl: 0 .  furosemide (LASIX) 40 MG tablet, Take 40 mg by mouth daily as needed for fluid (chest pressure). , Disp: , Rfl:  .  guaiFENesin (MUCINEX) 600 MG 12 hr tablet, Take 1 tablet (600 mg total) by mouth 2 (two) times daily as needed., Disp: , Rfl:  .  metoprolol tartrate  (LOPRESSOR) 25 MG tablet, Take 0.5 tablets (12.5 mg total) by mouth 2 (two) times daily., Disp: , Rfl:  .  Multiple Vitamins-Minerals (ICAPS PO), Take 1 capsule by mouth daily., Disp: , Rfl:  .  omeprazole (PRILOSEC) 40 MG capsule, Take 1 capsule (40 mg total) by mouth 2 (two) times daily., Disp: 60 capsule, Rfl: 5 .  potassium chloride SA (K-DUR,KLOR-CON) 20 MEQ tablet, Take 1 tablet (20 mEq total) by mouth daily., Disp: , Rfl:  .  tamsulosin (FLOMAX) 0.4 MG CAPS capsule, Take 0.4 mg by mouth 2 (two) times daily. For urinary symptoms , Disp: , Rfl:  .  traMADol (ULTRAM) 50 MG tablet, Take 1 tablet (50 mg total) by mouth every 6 (six) hours as needed for moderate pain., Disp: 30 tablet, Rfl: 0 .  montelukast (SINGULAIR) 10 MG tablet, Take 1 tablet (10 mg total) by mouth at bedtime., Disp: 30 tablet, Rfl: 11

## 2017-09-28 NOTE — Patient Instructions (Addendum)
Wheezing: I think this is due to poor asthma control as well as gastroesophageal reflux disease We will check an exhaled nitric oxide test, blood test to look for inflammation: CBC with differential, serum IgE Add Singulair 10 mg daily  Gastroesophageal reflux disease: We will increase omeprazole to 40 mg twice a day  COPD with asthma: As listed above, we are going to add Singulair Continue Brio daily Use albuterol as needed for chest tightness wheezing or shortness of breath  Shortness of breath: We will check a chest x-ray  Pulmonary nodule seen in November 2018: We will repeat a CT chest in February  We will see you back in February

## 2017-09-28 NOTE — Patient Instructions (Addendum)
Medication Instructions:  Your physician has recommended you make the following change in your medication:  1.  CHANGE the Lasix to 40 mg taking 1 tablet at 5 a.m and 1 tablet at lunch time 2.  CHANGE the Potassium to 20 meq taking 1 tablet at 5 a.m and 1/2 tablet at lunch time   Labwork: TODAY:  BMET  10/04/17 ANYTIME THAT DAY:   BMET   Testing/Procedures: None ordered  Follow-Up: Your physician recommends that you schedule a follow-up appointment in: 10/11/17 ARRIVE AT 2:15 TO SEE Tim Kicks, NP   Any Other Special Instructions Will Be Listed Below (If Applicable).     If you need a refill on your cardiac medications before your next appointment, please call your pharmacy.

## 2017-09-29 LAB — BASIC METABOLIC PANEL
BUN/Creatinine Ratio: 14 (ref 10–24)
BUN: 18 mg/dL (ref 8–27)
CO2: 25 mmol/L (ref 20–29)
Calcium: 8.5 mg/dL — ABNORMAL LOW (ref 8.6–10.2)
Chloride: 106 mmol/L (ref 96–106)
Creatinine, Ser: 1.28 mg/dL — ABNORMAL HIGH (ref 0.76–1.27)
GFR calc Af Amer: 58 mL/min/{1.73_m2} — ABNORMAL LOW (ref >59)
GFR calc non Af Amer: 50 mL/min/{1.73_m2} — ABNORMAL LOW (ref >59)
Glucose: 122 mg/dL — ABNORMAL HIGH (ref 65–99)
Potassium: 4.2 mmol/L (ref 3.5–5.2)
Sodium: 145 mmol/L — ABNORMAL HIGH (ref 134–144)

## 2017-09-29 LAB — IGE: IgE (Immunoglobulin E), Serum: 46 kU/L (ref <=114)

## 2017-09-30 ENCOUNTER — Telehealth: Payer: Self-pay | Admitting: *Deleted

## 2017-09-30 ENCOUNTER — Other Ambulatory Visit: Payer: Self-pay | Admitting: Cardiothoracic Surgery

## 2017-09-30 DIAGNOSIS — Z951 Presence of aortocoronary bypass graft: Secondary | ICD-10-CM

## 2017-09-30 MED ORDER — FUROSEMIDE 40 MG PO TABS: 40.0000 mg | ORAL_TABLET | Freq: Every day | ORAL | 3 refills | Status: DC

## 2017-09-30 MED ORDER — POTASSIUM CHLORIDE CRYS ER 20 MEQ PO TBCR: 20.0000 meq | EXTENDED_RELEASE_TABLET | Freq: Every day | ORAL | 3 refills | Status: DC

## 2017-09-30 NOTE — Telephone Encounter (Signed)
-----   Message from Isaiah Serge, NP sent at 09/30/2017  1:22 PM EST ----- Decrease lasix back to 40 mg daily and K dur to 20 meq daily beginning Sat.  Is his swelling any better?

## 2017-09-30 NOTE — Telephone Encounter (Signed)
Pt daughter [name] called back. She states that pt reports that his swelling while he is lying down, is ok. When he gets up, his right legs swells moreso than the left one.

## 2017-10-02 ENCOUNTER — Telehealth: Payer: Self-pay | Admitting: Internal Medicine

## 2017-10-02 NOTE — Telephone Encounter (Signed)
Daughter Tim [name] for Tim [name] -  says patient c/o yellow sputum since  10/02/2017 AM - NEW issue and then ongoing But no fever, or other issues. No other sympotms. Feels fine otherwise. On chronic doxycycline for rosacea. Had CABG 08/24/17    Plan  - cephalexin 500mg  three times daily x  5 days (hold doxy durin gthis period)    Allergies  Allergen Reactions  . Statins Other (See Comments)    CLASS EFFECT SEVERE LEG CRAMPS [MULTIPLE STATINS]    Dr. Brand [name], M.D., [facility].C.P Pulmonary and Critical Care Medicine Staff Physician, [place] Director - Interstitial Lung Disease  Program  Pulmonary [place] [facility] [place], [place], [address]  Pager: [phone], If no answer or between  15:00h - 7:00h: call [phone] Telephone: [phone]

## 2017-10-04 ENCOUNTER — Ambulatory Visit: Payer: Self-pay

## 2017-10-04 ENCOUNTER — Other Ambulatory Visit: Payer: Medicare Other

## 2017-10-07 ENCOUNTER — Ambulatory Visit
Admission: RE | Admit: 2017-10-07 | Discharge: 2017-10-07 | Disposition: A | Discharge status: Home / Self Care | Payer: Medicare Other | Source: Ambulatory Visit | Attending: Cardiothoracic Surgery | Admitting: Cardiothoracic Surgery

## 2017-10-07 ENCOUNTER — Ambulatory Visit (INDEPENDENT_AMBULATORY_CARE_PROVIDER_SITE_OTHER): Payer: Self-pay | Admitting: Surgical

## 2017-10-07 ENCOUNTER — Other Ambulatory Visit: Payer: Medicare Other

## 2017-10-07 ENCOUNTER — Other Ambulatory Visit: Payer: Self-pay

## 2017-10-07 ENCOUNTER — Encounter: Payer: Self-pay | Admitting: Surgical

## 2017-10-07 VITALS — BP 103/62 | HR 108 | Resp 20 | Ht 71.0 in | Wt 190.2 lb

## 2017-10-07 DIAGNOSIS — I25118 Atherosclerotic heart disease of native coronary artery with other forms of angina pectoris: Secondary | ICD-10-CM

## 2017-10-07 DIAGNOSIS — Z951 Presence of aortocoronary bypass graft: Secondary | ICD-10-CM

## 2017-10-07 DIAGNOSIS — E876 Hypokalemia: Secondary | ICD-10-CM

## 2017-10-07 LAB — BASIC METABOLIC PANEL
BUN/Creatinine Ratio: 16 (ref 10–24)
BUN: 20 mg/dL (ref 8–27)
CO2: 24 mmol/L (ref 20–29)
Calcium: 9 mg/dL (ref 8.6–10.2)
Chloride: 103 mmol/L (ref 96–106)
Creatinine, Ser: 1.28 mg/dL — ABNORMAL HIGH (ref 0.76–1.27)
GFR calc Af Amer: 58 mL/min/{1.73_m2} — ABNORMAL LOW (ref >59)
GFR calc non Af Amer: 50 mL/min/{1.73_m2} — ABNORMAL LOW (ref >59)
Glucose: 77 mg/dL (ref 65–99)
Potassium: 4.9 mmol/L (ref 3.5–5.2)
Sodium: 142 mmol/L (ref 134–144)

## 2017-10-07 MED ORDER — METOPROLOL TARTRATE 25 MG PO TABS: 12.5000 mg | ORAL_TABLET | Freq: Two times a day (BID) | ORAL | 1 refills | Status: DC

## 2017-10-07 NOTE — Patient Instructions (Signed)
Continue to increase ambulation as instructed.  Continue incentive spirometer.  Use care with feeding using small bites.

## 2017-10-07 NOTE — Progress Notes (Signed)
[address]       [place] [address]             [phone]      [facility] [place] Medical Record #[id number] Date of Birth: June 06, 1929  Referring: Nelva Bush, MD Primary Care: Raelene Bott, MD Primary Cardiologist: Candee Furbish, MD   Chief Complaint:   POST OP FOLLOW UP PHYSICIAN:  Lanelle Bal, MD    DATE OF BIRTH:  11/01/1928  DATE OF PROCEDURE:  08/24/2017 DATE OF DISCHARGE:                              OPERATIVE REPORT   PREOPERATIVE DIAGNOSIS:  Coronary occlusive disease with angina.  POSTOPERATIVE DIAGNOSIS:  Coronary occlusive disease with angina.  SURGICAL PROCEDURES:  Coronary artery bypass grafting x4 with the left internal mammary to the left anterior descending coronary artery, reversed saphenous vein graft to the diagonal coronary artery, reversed saphenous vein graft to the distal circumflex coronary artery, reversed saphenous vein graft to the posterior descending coronary artery with bilateral right and left greater saphenous endoscopic vein harvesting.  SURGEON:  Lanelle Bal, MD.  FIRST ASSISTANT:  Lars Pinks, [place].   History of Present Illness:    The patient is an 84 year old male status post the above described procedure seen in the office on today's date and routine postsurgical follow-up.  Patient denies any pain and is not taking any analgesics.  He denies shortness of breath.  He is having no anginal equivalents.  He continues to have some lower extremity edema but this is steadily improving.  He denies fevers, chills or other constitutional symptoms.  He is improving his ambulation over time.  He does feel somewhat weak at times but this is in an intermittent pattern.  In general he feels as though he is making good overall progress in a fairly steady fashion.  He does describe some occasional difficulty with swallowing.  This may be related to intubation or possibly TEE.  We discussed  using very small bites and being careful with liquids.  He does see pulmonology.  If this does not improve over time he may require further evaluation/swallow study but he appears to be tolerating it pretty well with care while eating.      Past Medical History:  Diagnosis Date  . AAA (abdominal aortic aneurysm) (HCC)    3.2 cm 07/2016; 2-3 year f/u recommended  . Asthma   . BPH (benign prostatic hypertrophy)   . CAD (coronary artery disease)    9 STEMI 2001, PCI, RCA, residual 100% circumflex  /   PCI for in-stent restenosis June, 2001  /  nuclear February, 2010, no ischemia  . Cancer ([place])    skin cancer removed  . Carotid artery disease ([place])    Doppler, June, 2011, 0-39% bilateral, pt denies  . Cervical disc disease    Status post neurosurgery  . Colon polyps   . Ejection fraction    EF 55-65%, echo, 2009  . Elevated CPK    Chronic mild CPK elevation  . Emphysema    Mild  . Fatigue    Morning fatigue, August, 2011  . GERD (gastroesophageal reflux disease)   . Headache    back of head  . Hyperlipidemia   . Hypertension   . Low back pain    February, 2013  . Myocardial infarction ([place])   . Paralyzed hemidiaphragm  Right  . Paralyzed hemidiaphragm    Chronic  . Statin intolerance    Elevated CPK in the past     Social History   Tobacco Use  Smoking Status Former Smoker  . Packs/day: 3.00  . Years: 40.00  . Pack years: 120.00  . Types: Cigarettes  . Last attempt to quit: 09/15/1975  . Years since quitting: 42.0  Smokeless Tobacco Never Used    Social History   Substance and Sexual Activity  Alcohol Use No  . Frequency: Never     Allergies  Allergen Reactions  . Statins Other (See Comments)    CLASS EFFECT SEVERE LEG CRAMPS [MULTIPLE STATINS]    Current Outpatient Medications  Medication Sig Dispense Refill  . acetaminophen (TYLENOL) 500 MG tablet Take 500 mg by mouth every 8 (eight) hours as needed (for pain.).    [name] albuterol (PROVENTIL  HFA;VENTOLIN HFA) 108 (90 BASE) MCG/ACT inhaler Inhale 2 puffs into the lungs every 6 (six) hours as needed for wheezing or shortness of breath. 1 Inhaler 6  . albuterol (PROVENTIL) (2.5 MG/3ML) 0.083% nebulizer solution Take 3 mLs (2.5 mg total) by nebulization every 6 (six) hours as needed for wheezing or shortness of breath. 75 mL 12  . amiodarone (PACERONE) 200 MG tablet Take 1 tablet (200 mg total) by mouth daily.    [name] aspirin EC 81 MG tablet Take 81 mg by mouth daily.    [name] doxycycline (PERIOSTAT) 20 MG tablet Take 20 mg by mouth daily.    . finasteride (PROSCAR) 5 MG tablet Take 5 mg by mouth 2 (two) times daily.     . fluticasone furoate-vilanterol (BREO ELLIPTA) 100-25 MCG/INH AEPB Inhale 1 puff into the lungs daily. 1 each 0  . furosemide (LASIX) 40 MG tablet Take 1 tablet (40 mg total) by mouth daily. 90 tablet 3  . montelukast (SINGULAIR) 10 MG tablet Take 10 mg by mouth at bedtime.    . Multiple Vitamins-Minerals (ICAPS PO) Take 1 capsule by mouth daily.    [name] omeprazole (PRILOSEC) 40 MG capsule Take 1 capsule (40 mg total) by mouth 2 (two) times daily. 60 capsule 5  . potassium chloride SA (K-DUR,KLOR-CON) 20 MEQ tablet Take 1 tablet (20 mEq total) by mouth daily. 90 tablet 3  . tamsulosin (FLOMAX) 0.4 MG CAPS capsule Take 0.4 mg by mouth 2 (two) times daily. For urinary symptoms     . metoprolol tartrate (LOPRESSOR) 25 MG tablet Take 0.5 tablets (12.5 mg total) by mouth 2 (two) times daily. (Patient not taking: Reported on 10/07/2017)     No current facility-administered medications for this visit.        Physical Exam: BP 103/62 (BP Location: Right Arm, Patient Position: Sitting, Cuff Size: Large)   Pulse (!) 108   Resp 20   Ht 5\' 11"  (1.803 m)   Wt 190 lb 3.2 oz (86.3 kg)   SpO2 95% Comment: RA  BMI 26.53 kg/m   General appearance: alert, cooperative and no distress Heart: regular rate and rhythm and Tachycardic Lungs: Minimally diminished in the left base Abdomen:  soft, non-tender; bowel sounds normal; no masses,  no organomegaly Extremities: Bilateral lower extremity edema Wound: Incisions all healing well without evidence of infection.   Diagnostic Studies & Laboratory data:     Recent Radiology Findings:   Dg Chest 2 View  Result Date: 10/07/2017 CLINICAL DATA:  Chest tenderness and history of CABG. EXAM: CHEST  2 VIEW COMPARISON:  09/28/2017.  08/31/2017 FINDINGS: Heart  size is within normal limits and stable. Post CABG changes. Stable blunting of both costophrenic angles, left side is larger than right. These findings appear chronic and favor pleural thickening versus effusions. No pulmonary edema. No focal airspace disease. Surgical plate and screws in the lower cervical spine. IMPRESSION: No acute cardiopulmonary disease. Stable blunting of both costophrenic angles. These findings are not significantly changed and likely related to pleural thickening. Difficult to exclude small effusions. No pulmonary edema. Electronically Signed   By: Markus Daft M.D.   On: 10/07/2017 13:28      Recent Lab Findings: Lab Results  Component Value Date   WBC 10.1 09/28/2017   HGB 9.2 (L) 09/28/2017   HCT 28.6 (L) 09/28/2017   PLT 324.0 09/28/2017   GLUCOSE 122 (H) 09/28/2017   ALT 18 08/20/2017   AST 13 (L) 08/20/2017   NA 145 (H) 09/28/2017   K 4.2 09/28/2017   CL 106 09/28/2017   CREATININE 1.28 (H) 09/28/2017   BUN 18 09/28/2017   CO2 25 09/28/2017   TSH 2.942 08/31/2017   INR 1.39 08/24/2017   HGBA1C 6.4 (H) 08/20/2017      Assessment / Plan: The patient is making very steady continuous progress.  He is tachycardic but it is noted that he ran out of his metoprolol.  I have renewed this prescription for him 12.5 mg p.o. twice daily with 1 refill.  He is currently on Lasix and potassium supplement which he will continue.  His edema reportedly is improving over time.  We will see the patient in 1 month with a repeat chest x-ray at that time.  He is  scheduled to see Dr. Marlou Porch next week.         John Giovanni, PA-C 10/07/2017 1:36 PM

## 2017-10-08 ENCOUNTER — Telehealth: Payer: Self-pay | Admitting: Cardiology

## 2017-10-08 NOTE — Telephone Encounter (Signed)
New Message   Pt c/o medication issue:  1. Name of Medication: metoprolol tartrate 2. How are you currently taking this medication (dosage and times per day)? 25 mg tablet take 1/2 tablet twice a day   3. Are you having a reaction (difficulty breathing--STAT)? Dizzy    4. What is your medication issue? Patients daughter states that the surgeon put her father on the metoprolol tartrate. But when he father took it today he started feeling dizzy. Please call to discuss.

## 2017-10-08 NOTE — Telephone Encounter (Signed)
Spoke with daughter how reports pt has not bee n taking Metoprolol since December and was restarted on it this AM by the surgeon d/t increased HR. (per note 108 bpm).  Pt in the past had been on Atenolol 50 mg a day but this was d/ced 09/03/2017 and changed to Metoprolol 25 mg 1/2 tablet BID however pt did not start it.  He took the 1/2 tablet this am and became dizzy shortly after.  Advised to hold Metoprolol for now, wear knee high compression stockings, make sure he is drinking enough fluid and take care in changing positions.  Pt does have an appt scheduled on Monday with Cecilie Kicks and will be re-evaluated at that time.  Daughter was very grateful to the c/b and information.

## 2017-10-11 ENCOUNTER — Encounter: Payer: Self-pay | Admitting: Cardiology

## 2017-10-11 ENCOUNTER — Ambulatory Visit: Payer: Medicare Other | Admitting: Cardiology

## 2017-10-11 VITALS — BP 124/44 | HR 53 | Resp 16 | Ht 71.0 in | Wt 191.8 lb

## 2017-10-11 DIAGNOSIS — E7800 Pure hypercholesterolemia, unspecified: Secondary | ICD-10-CM | POA: Diagnosis not present

## 2017-10-11 DIAGNOSIS — I480 Paroxysmal atrial fibrillation: Secondary | ICD-10-CM | POA: Diagnosis not present

## 2017-10-11 DIAGNOSIS — I10 Essential (primary) hypertension: Secondary | ICD-10-CM | POA: Diagnosis not present

## 2017-10-11 DIAGNOSIS — Z951 Presence of aortocoronary bypass graft: Secondary | ICD-10-CM | POA: Diagnosis not present

## 2017-10-11 DIAGNOSIS — N183 Chronic kidney disease, stage 3 unspecified: Secondary | ICD-10-CM

## 2017-10-11 DIAGNOSIS — I2510 Atherosclerotic heart disease of native coronary artery without angina pectoris: Secondary | ICD-10-CM | POA: Diagnosis not present

## 2017-10-11 MED ORDER — DILTIAZEM HCL ER COATED BEADS 120 MG PO CP24: 120.0000 mg | ORAL_CAPSULE | Freq: Every day | ORAL | 3 refills | Status: DC

## 2017-10-11 NOTE — Patient Instructions (Addendum)
Medication Instructions:  1. STOP AMIODARONE 2. STOP LOPRESSOR 3. START DILTIAZEM CD 120 MG DAILY; PRESCRIPTION HAS BEEN SENT IN   Labwork: BMET TODAY  Testing/Procedures: NONE  ORDERED TODAY  Follow-Up: 11/08/17 @ 3:20 WITH DR. Marlou Porch   Any Other Special Instructions Will Be Listed Below (If Applicable). STOP DRINKING ALOE VERA JUICE     If you need a refill on your cardiac medications before your next appointment, please call your pharmacy.

## 2017-10-11 NOTE — Progress Notes (Signed)
Cardiology Office Note   Date:  10/11/2017   ID:  [name], DOB 01/16/1929, MRN [id number]  PCP:  Raelene Bott, MD  Cardiologist:  Dr. Marlou Porch    Chief Complaint  Patient presents with  . Follow-up    CAD, fatigue       History of Present Illness: Tim [name] is a 84 y.o. male who presents for edema post CABG.    He has a hx of bilateral carotid artery disease,, coronary artery disease status post STEMI in 2001 with PCI to RCA, residual 100% circumflex, subsequent PCI for in-stent restenosis in June 2001, nuclear in 2014 with no scar, no ischemia 62%. He has had trouble with statins in the past, elevated CPK in the past. Former smoker quit in 1980.  When seen in Nov 2018 he had angina and cardiac cath was arranged.  Cath with significant three-vessel coronary artery disease, including sequential 70% and 50% proximal/mid LAD stenoses as well as chronic total occlusion of the proximal/mid LCx and distal RCA., Moderate to severe in-stent restenosis in the mid/distal RCA.  Pt then was evaluated by Dr. Servando Snare and pulmonary with Dr. Lake Bells for COPD eval for surgery.  He has paralyzed Rt hemidiaphragm.     He underwent CABG X 4 08/24/17  with LIMA to LAD, VG to diagonal, VG to LCX, VG to PDA  Discharged 09/03/17   He did have post op PAF with RVR on amiodarone and dilt drips  He has peaked T waves post op most likely pericarditis. He did convert to SR and discharged on atenolol but developed wheezing so changed to lopressor.    His diabetes was controlled.  Discharged on amiodarone daily 200 mg and BB.    On last visit he had increased edema.  Also with DOE.  Back today for follow up.    Today still with some lower ext edema but improved.  He complains of no appetite, not wanting to drink.  He actually has been drinking aloe vera drink and when checked in Up to Date. Could cause rapid HR, diarrhea we have asked him not to drink.   He is maintaining SR to ST.   He also  relates that when he takes lopressor he becomes very dizzy.  So he is not taking.  Unfortunately his HR climbs easily.  His EF is normal.  BP log he brought was more elevated than low, though on occ to 323 systolic.      Otherwise he is bored and would like to be doing more.     Past Medical History:  Diagnosis Date  . AAA (abdominal aortic aneurysm) (HCC)    3.2 cm 07/2016; 2-3 year f/u recommended  . Asthma   . BPH (benign prostatic hypertrophy)   . CAD (coronary artery disease)    9 STEMI 2001, PCI, RCA, residual 100% circumflex  /   PCI for in-stent restenosis June, 2001  /  nuclear February, 2010, no ischemia  . Cancer ([place])    skin cancer removed  . Carotid artery disease ([place])    Doppler, June, 2011, 0-39% bilateral, pt denies  . Cervical disc disease    Status post neurosurgery  . Colon polyps   . Ejection fraction    EF 55-65%, echo, 2009  . Elevated CPK    Chronic mild CPK elevation  . Emphysema    Mild  . Fatigue    Morning fatigue, August, 2011  . GERD (gastroesophageal reflux disease)   .  Headache    back of head  . Hyperlipidemia   . Hypertension   . Low back pain    February, 2013  . Myocardial infarction ([place])   . Paralyzed hemidiaphragm    Right  . Paralyzed hemidiaphragm    Chronic  . Statin intolerance    Elevated CPK in the past    Past Surgical History:  Procedure Laterality Date  . BREAST SURGERY     right breast removed  . CERVICAL DISC SURGERY    . CORONARY ARTERY BYPASS GRAFT N/A 08/24/2017   Procedure: CORONARY ARTERY BYPASS GRAFTING times four using left internal mammary artery and bilateral saphenous vein, using endoscope. TEE;  Surgeon: Grace Isaac, MD;  Location: [facility];  Service: Open Heart Surgery;  Laterality: N/A;  . LEFT HEART CATH AND CORONARY ANGIOGRAPHY N/A 07/29/2017   Procedure: LEFT HEART CATH AND CORONARY ANGIOGRAPHY;  Surgeon: Nelva Bush, MD;  Location: [facility];  Service: Cardiovascular;  Laterality:  N/A;  . SKIN CANCER EXCISION    . TEE WITHOUT CARDIOVERSION N/A 08/24/2017   Procedure: TRANSESOPHAGEAL ECHOCARDIOGRAM (TEE);  Surgeon: Grace Isaac, MD;  Location: [facility];  Service: Open Heart Surgery;  Laterality: N/A;     Current Outpatient Medications  Medication Sig Dispense Refill  . albuterol (PROVENTIL HFA;VENTOLIN HFA) 108 (90 BASE) MCG/ACT inhaler Inhale 2 puffs into the lungs every 6 (six) hours as needed for wheezing or shortness of breath. 1 Inhaler 6  . albuterol (PROVENTIL) (2.5 MG/3ML) 0.083% nebulizer solution Take 3 mLs (2.5 mg total) by nebulization every 6 (six) hours as needed for wheezing or shortness of breath. 75 mL 12  . amiodarone (PACERONE) 200 MG tablet Take 1 tablet (200 mg total) by mouth daily.    [name] aspirin EC 81 MG tablet Take 81 mg by mouth daily.    [name] doxycycline (PERIOSTAT) 20 MG tablet Take 20 mg by mouth daily.    . finasteride (PROSCAR) 5 MG tablet Take 5 mg by mouth 2 (two) times daily.     . fluticasone furoate-vilanterol (BREO ELLIPTA) 100-25 MCG/INH AEPB Inhale 1 puff into the lungs daily. 1 each 0  . furosemide (LASIX) 40 MG tablet Take 1 tablet (40 mg total) by mouth daily. 90 tablet 3  . montelukast (SINGULAIR) 10 MG tablet Take 10 mg by mouth at bedtime.    . Multiple Vitamins-Minerals (ICAPS PO) Take 1 capsule by mouth daily.    [name] omeprazole (PRILOSEC) 40 MG capsule Take 1 capsule (40 mg total) by mouth 2 (two) times daily. 60 capsule 5  . potassium chloride SA (K-DUR,KLOR-CON) 20 MEQ tablet Take 1 tablet (20 mEq total) by mouth daily. 90 tablet 3  . tamsulosin (FLOMAX) 0.4 MG CAPS capsule Take 0.4 mg by mouth 2 (two) times daily. For urinary symptoms     . metoprolol tartrate (LOPRESSOR) 25 MG tablet Take 0.5 tablets (12.5 mg total) by mouth 2 (two) times daily. (Patient not taking: Reported on 10/11/2017) 30 tablet 1   No current facility-administered medications for this visit.     Allergies:   Statins    Social History:  The patient   reports that he quit smoking about 42 years ago. His smoking use included cigarettes. He has a 120.00 pack-year smoking history. he has never used smokeless tobacco. He reports that he does not drink alcohol or use drugs.   Family History:  The patient's family history includes Cancer in his brother; Diabetes in his mother and sister;  Kidney disease in his brother; Other in his father and sister.    ROS:  General:no colds or fevers, no weight changes Skin:no rashes or ulcers HEENT:no blurred vision, no congestion CV:see HPI PUL:see HPI GI:no diarrhea constipation or melena, no indigestion GU:no hematuria, no dysuria MS:no joint pain, no claudication Neuro:no syncope, no lightheadedness Endo:no diabetes, no thyroid disease  Wt Readings from Last 3 Encounters:  10/11/17 191 lb 12.8 oz (87 kg)  10/07/17 190 lb 3.2 oz (86.3 kg)  09/28/17 198 lb 12.8 oz (90.2 kg)     PHYSICAL EXAM: VS:  BP (!) 124/44   Pulse (!) 53   Resp 16   Ht 5\' 11"  (1.803 m)   Wt 191 lb 12.8 oz (87 kg)   SpO2 98%   BMI 26.75 kg/m  , BMI Body mass index is 26.75 kg/m. General:Pleasant affect, NAD Skin:Warm and dry, brisk capillary refill HEENT:normocephalic, sclera clear, mucus membranes moist Neck:supple, no JVD, no bruits  Heart:S1S2 RRR without murmur, gallup, rub or click Lungs: without rales, rhonchi, + wheezes JOI:NOMV, non tender, + BS, do not palpate liver spleen or masses Ext:1-2 + lower ext edema, 2+ pedal pulses, 2+ radial pulses Neuro:alert and oriented X 3, MAE, follows commands, + facial symmetry    EKG:  EKG is ordered today. The ekg ordered today demonstrates SR at 96  + PVC, no acute changes otherwise.    Recent Labs: 08/20/2017: ALT 18 08/25/2017: Magnesium 2.5 08/31/2017: TSH 2.942 09/28/2017: Hemoglobin 9.2; Platelets 324.0 10/07/2017: BUN 20; Creatinine, Ser 1.28; Potassium 4.9; Sodium 142    Lipid Panel No results found for: CHOL, TRIG, HDL, CHOLHDL, VLDL, LDLCALC,  LDLDIRECT     Other studies Reviewed: Additional studies/ records that were reviewed today include: . Cardiac cath 07/29/17 .Conclusion   Conclusions: 1. Significant three-vessel coronary artery disease, including sequential 70% and 50% proximal/mid LAD stenoses as well as chronic total occlusion of the proximal/mid LCx and distal RCA. 2. Moderate to severe in-stent restenosis in the mid/distal RCA. 3. Normal left ventricular contraction and filling pressure.  Recommendations: 1. Cardiac surgery consultation, given significant three vessel coronary artery disease. OM3 and rPDA are supplied by collaterals. 2. If patient is not a surgical candidate, PCI to proximal/mid LAD could be considered, with continued medical management of chronic total occlusions of the LCx and RCA. 3. Aggressive secondary prevention. Consider retrial of statin versus evaluation for PCSK9 inhibitor therapy. 4. Continue atenolol and isosorbide mononitrate, to be uptitrated as tolerated.     ECHO 08/20/17 Study Conclusions  - Left ventricle: The cavity size was normal. Systolic function was normal. The estimated ejection fraction was in the range of 55% to 60%. Probable hypokinesis of the basal-midinferolateral and inferior myocardium; consistent with ischemia in the distribution of the right coronary or left circumflex coronary artery. Doppler parameters are consistent with abnormal left ventricular relaxation (grade 1 diastolic dysfunction). - Left atrium: The atrium was mildly dilated.  Vas dopplers 08/20/17 Final Interpretation: Right Carotid: There is evidence in the right ICA of a 1-39% stenosis.  Left Carotid: There is evidence in the left ICA of a 40-59% stenosis. Vertebrals: Both vertebral arteries were patent with antegrade flow. Subclavians:  Right ABI: Resting right ankle-brachial index is within normal range. No evidence of significant right lower extremity arterial  disease. Left ABI: Resting left ankle-brachial index indicates mild right lower extremity arterial disease. Right Upper Extremity: No significant arterial obstruction detected in the right upper extremity. Doppler waveform obliterate with compression. Doppler waveforms  remain within normal limits with compression. Left Upper Extremity: No significant arterial obstruction detected in the left upper extremity. Doppler waveform obliterate with compression. Doppler waveforms decrease <50% w/compression.   CABG 08/24/17 CORONARY ARTERY BYPASS GRAFTINGx 4 (LIMA to LAD, SVG to DIAGONAL, SVG to CIRCUMFLEX, SVG to PDA)using left internal mammary artery and bilateral greatersaphenous veinvia EVH      ASSESSMENT AND PLAN:  1.  CAD with unstable angina and CABG X 4 LIMA to LAD, SVG to DIAGONAL, SVG to CIRCUMFLEX, SVG to PDA)using left internal mammary artery and bilateral greatersaphenous veinvia EVH.  --on ASA, he is not taking BB due to dizziness.  HR is still elevated.  Will stop BB and use Dilt CD 120 mg daily.  He did not tolerate the atenolol post op due to wheezes.  follow up with Dr. Marlou Porch in 4-5 weeks.   2.  PAF pos op amiodarone at 200mg  but with decreased appetite, will stop-  Discussed with Dr. Marlou Porch, and added dilt as above.   Not on anticoagulation.    3.  HLD statin intolerant with elevated CPK in the past - would recommend lipid clinic.  4.  COPD followed by Pulmonary   Now on singulair and increased dose of PPI  5.   Continues with volume overload, stopping sloe vera juice may help, for now continue lasix once daily and BMP today.  6.  GERD with increase of PPI.       Current medicines are reviewed with the patient today.  The patient Has no concerns regarding medicines.  The following changes have been made:  See above Labs/ tests ordered today include:see above  Disposition:   FU:  see above  Signed, Cecilie Kicks, NP  10/11/2017 2:37 PM    [facility], [place], [address] [place], [place] Phone: [phone]; Fax: [phone]

## 2017-10-12 ENCOUNTER — Telehealth: Payer: Self-pay | Admitting: Cardiology

## 2017-10-12 LAB — BASIC METABOLIC PANEL
BUN/Creatinine Ratio: 14 (ref 10–24)
BUN: 18 mg/dL (ref 8–27)
CO2: 25 mmol/L (ref 20–29)
Calcium: 8.3 mg/dL — ABNORMAL LOW (ref 8.6–10.2)
Chloride: 104 mmol/L (ref 96–106)
Creatinine, Ser: 1.27 mg/dL (ref 0.76–1.27)
GFR calc Af Amer: 59 mL/min/{1.73_m2} — ABNORMAL LOW (ref >59)
GFR calc non Af Amer: 51 mL/min/{1.73_m2} — ABNORMAL LOW (ref >59)
Glucose: 102 mg/dL — ABNORMAL HIGH (ref 65–99)
Potassium: 4.7 mmol/L (ref 3.5–5.2)
Sodium: 142 mmol/L (ref 134–144)

## 2017-10-12 NOTE — Telephone Encounter (Signed)
Returned [employer], daughter, DPR on file. She has been made aware of pts lab results. See result note.

## 2017-10-12 NOTE — Telephone Encounter (Signed)
-----   Message from Isaiah Serge, NP sent at 10/12/2017  8:03 AM EST ----- Labs great.  Continue meds

## 2017-10-12 NOTE — Telephone Encounter (Signed)
Daughter returned this office call please call back if possible around 10:30a she will be on break then.

## 2017-11-04 ENCOUNTER — Encounter: Payer: Self-pay | Admitting: Cardiology

## 2017-11-08 ENCOUNTER — Ambulatory Visit: Payer: Medicare Other | Admitting: Cardiology

## 2017-11-10 ENCOUNTER — Encounter: Payer: Self-pay | Admitting: Pulmonary Disease

## 2017-11-10 ENCOUNTER — Other Ambulatory Visit: Payer: Medicare Other

## 2017-11-10 ENCOUNTER — Ambulatory Visit (INDEPENDENT_AMBULATORY_CARE_PROVIDER_SITE_OTHER)
Admission: RE | Admit: 2017-11-10 | Discharge: 2017-11-10 | Disposition: A | Discharge status: Home / Self Care | Payer: Medicare Other | Source: Ambulatory Visit | Attending: Pulmonary Disease | Admitting: Pulmonary Disease

## 2017-11-10 ENCOUNTER — Other Ambulatory Visit: Payer: Self-pay | Admitting: Cardiothoracic Surgery

## 2017-11-10 ENCOUNTER — Ambulatory Visit: Payer: Medicare Other | Admitting: Pulmonary Disease

## 2017-11-10 VITALS — BP 124/64 | HR 106 | Ht 71.0 in | Wt 183.4 lb

## 2017-11-10 DIAGNOSIS — J301 Allergic rhinitis due to pollen: Secondary | ICD-10-CM

## 2017-11-10 DIAGNOSIS — J4550 Severe persistent asthma, uncomplicated: Secondary | ICD-10-CM

## 2017-11-10 DIAGNOSIS — R0982 Postnasal drip: Secondary | ICD-10-CM | POA: Diagnosis not present

## 2017-11-10 DIAGNOSIS — J411 Mucopurulent chronic bronchitis: Secondary | ICD-10-CM | POA: Diagnosis not present

## 2017-11-10 DIAGNOSIS — R918 Other nonspecific abnormal finding of lung field: Secondary | ICD-10-CM | POA: Diagnosis not present

## 2017-11-10 DIAGNOSIS — R911 Solitary pulmonary nodule: Secondary | ICD-10-CM | POA: Diagnosis not present

## 2017-11-10 DIAGNOSIS — J432 Centrilobular emphysema: Secondary | ICD-10-CM | POA: Diagnosis not present

## 2017-11-10 DIAGNOSIS — I2510 Atherosclerotic heart disease of native coronary artery without angina pectoris: Secondary | ICD-10-CM

## 2017-11-10 MED ORDER — SODIUM CHLORIDE 3 % IN NEBU: INHALATION_SOLUTION | Freq: Two times a day (BID) | RESPIRATORY_TRACT | 11 refills | Status: DC

## 2017-11-10 NOTE — Progress Notes (Signed)
Subjective:    Patient ID: Tim [name], male    DOB: 02-02-1929, 84 y.o.   MRN: [id number]  Synopsis: Former patient of Dr. Gwenette [name] with COPD and a paralyzed R Hemidiaphragm as seen on fluoroscopy in 2007. Lung function testing in 2005 showed clear airflow obstruction, FEV1 of 1.53 L (43% predicted), total lung capacity 62% predicted, DLCO 73% predicted. He smoked 2-3 packs per day for 40 years, quit around 1980.  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    HPI Chief Complaint  Patient presents with  . Follow-up    review CT chest from this morning.  pt c/o increased wheezing, prod cough with clear mucus, PND.     Tim [name] is still coughing and wheezing all the time. He says that it is worse when he lay flat.  He gets mucus from his sinuses that goes into his throat.  He is not taking any allergy medicines besides the singulair.  He is taking Breo regularly.    He has a little chest tightness and shortness of breath.  Albuterol helps.   He is taking the antacid twice a day.    Past Medical History:  Diagnosis Date  . AAA (abdominal aortic aneurysm) (HCC)    3.2 cm 07/2016; 2-3 year f/u recommended  . Asthma   . BPH (benign prostatic hypertrophy)   . CAD (coronary artery disease)    9 STEMI 2001, PCI, RCA, residual 100% circumflex  /   PCI for in-stent restenosis June, 2001  /  nuclear February, 2010, no ischemia  . Cancer ([place])    skin cancer removed  . Carotid artery disease ([place])    Doppler, June, 2011, 0-39% bilateral, pt denies  . Cervical disc disease    Status post neurosurgery  . Colon polyps   . Ejection fraction    EF 55-65%, echo, 2009  . Elevated CPK    Chronic mild CPK elevation  . Emphysema    Mild  . Fatigue    Morning fatigue, August, 2011  . GERD (gastroesophageal reflux disease)   . Headache    back of head  . Hyperlipidemia   . Hypertension   . Low back pain    February, 2013  . Myocardial infarction ([place])   .  Paralyzed hemidiaphragm    Right  . Paralyzed hemidiaphragm    Chronic  . Statin intolerance    Elevated CPK in the past      Review of Systems  Constitutional: Negative for fatigue and fever.  HENT: Negative for postnasal drip and rhinorrhea.   Respiratory: Positive for cough. Negative for shortness of breath and wheezing.   Cardiovascular: Negative for chest pain and leg swelling.       Objective:   Physical Exam Vitals:   11/10/17 1037  BP: 124/64  Pulse: (!) 106  SpO2: 95%  Weight: 183 lb 6.4 oz (83.2 kg)  Height: 5\' 11"  (1.803 m)   RA  Gen: well appearing HENT: OP clear, TM's clear, neck supple PULM: Wheezing bilaterally B, normal percussion CV: RRR, no mgr, trace edema GI: BS+, soft, nontender Derm: no cyanosis or rash Psyche: normal mood and affect    Chest imaging 01/2016 CXR > granuloma left upper lobe November 2018 CT chest: Less than 4 mm solid pleural-based right upper lobe nodule noted, groundglass nodule 11.9 mm right upper lobe, centrilobular emphysema noted, nonspecific scarring in the bases.  Images independently reviewed by me  Labs:  CBC    Component Value Date/Time   WBC 10.1 09/28/2017 1239   RBC 3.32 (L) 09/28/2017 1239   HGB 9.2 (L) 09/28/2017 1239   HGB 13.0 07/27/2017 1020   HCT 28.6 (L) 09/28/2017 1239   HCT 38.3 07/27/2017 1020   PLT 324.0 09/28/2017 1239   PLT 269 07/27/2017 1020   MCV 86.3 09/28/2017 1239   MCV 87 07/27/2017 1020   MCH 28.6 08/31/2017 0429   MCHC 32.0 09/28/2017 1239   RDW 16.1 (H) 09/28/2017 1239   RDW 14.7 07/27/2017 1020   LYMPHSABS 1.3 09/28/2017 1239   MONOABS 0.6 09/28/2017 1239   EOSABS 0.2 09/28/2017 1239   BASOSABS 0.0 09/28/2017 1239   Labs: 08/2017 IgE normal January 2019 IgE normal  Exhaled nitric oxide test: January 2018 22 ppm  Chest imaging: November 10, 2017 CT chest images independently reviewed showing mild to moderate centrilobular emphysema, atelectasis in the bases, left upper  lobe nodule 12 mm in size new from prior, stable right lung groundglass nodule is dating back to 2008, moderate to large loculated pericardial effusion noted     Assessment & Plan:   Solitary pulmonary nodule  Abnormal findings on diagnostic imaging of lung - Plan: Fungus Culture & Smear, Respiratory or Resp and Sputum Culture, AFB Culture & Smear, CT Chest Wo Contrast  Postnasal drip - Plan: CT Maxillofacial LTD WO CM  Allergic rhinitis due to pollen, unspecified seasonality  Mucopurulent chronic bronchitis (HCC)  Severe persistent asthma without complication  Centrilobular emphysema (HCC)  Discussion: He continues to struggle with asthma symptoms from a COPD asthma overlap syndrome.  Blood work earlier this year did not show evidence of a high serum IgE or high serum eosinophil count.  We need to collect mucus to make sure there is not a chronic infection but I doubt it considering the fact that the mucus is predominantly clear.  His allergic rhinitis is poorly controlled.  He needs to take a regular regimen for that.  If he still has difficult to control asthma after getting his allergic rhinitis under control then we need to consider a biologic agent for severe persistent asthma.  Specifically I would consider dupilumab.  He was also noted today to have a new pulmonary nodule in the left upper lobe.  We will monitor this with a repeat CT scan.  Because of his history of chronic sinus symptoms after having a head neck surgery in 2009 I think a CT scan of his sinuses would be worthwhile considering all the sinus symptoms he has had recently.  Finally, I do not know what to make of the pericardial effusion seen on today's CT scan.  I will call the thoracic surgery clinic and make sure they know about this prior to seeing him tomorrow.  Allergic rhinitis: Keep taking singulair Use Neil Med rinses with distilled water at least twice per day using the instructions on the package. 1/2  hour after using the [facility] Med rinse, use Nasacort two puffs in each nostril once per day.  Remember that the Nasacort can take 1-2 weeks to work after regular use. Use generic zyrtec (cetirizine) every day.  If this doesn't help, then stop taking it and use chlorpheniramine-phenylephrine combination tablets.  Chronic sinusitis: We will check a CT scan of your sinuses  GERD: Continue prilosec twice a day  Pulmonary nodules: We will repeat a CT scan of your chest in 6 months  Pericardial effusion I will mention this to Dr. Servando Snare  COPD-Asthma:  Keep taking Breo daily  Keep taking Singulair Use hypertonic saline nebulizer before bedtime Use albuterol as needed for chest tightness, wheezing or shortness of breath  Follow up in 6-8 weeks    Current Outpatient Medications:  .  albuterol (PROVENTIL HFA;VENTOLIN HFA) 108 (90 BASE) MCG/ACT inhaler, Inhale 2 puffs into the lungs every 6 (six) hours as needed for wheezing or shortness of breath., Disp: 1 Inhaler, Rfl: 6 .  albuterol (PROVENTIL) (2.5 MG/3ML) 0.083% nebulizer solution, Take 3 mLs (2.5 mg total) by nebulization every 6 (six) hours as needed for wheezing or shortness of breath., Disp: 75 mL, Rfl: 12 .  aspirin EC 81 MG tablet, Take 81 mg by mouth daily., Disp: , Rfl:  .  diltiazem (CARDIZEM CD) 120 MG 24 hr capsule, Take 1 capsule (120 mg total) by mouth daily., Disp: 90 capsule, Rfl: 3 .  doxycycline (PERIOSTAT) 20 MG tablet, Take 20 mg by mouth daily., Disp: , Rfl:  .  finasteride (PROSCAR) 5 MG tablet, Take 5 mg by mouth 2 (two) times daily. , Disp: , Rfl:  .  fluticasone furoate-vilanterol (BREO ELLIPTA) 100-25 MCG/INH AEPB, Inhale 1 puff into the lungs daily., Disp: 1 each, Rfl: 0 .  furosemide (LASIX) 40 MG tablet, Take 1 tablet (40 mg total) by mouth daily., Disp: 90 tablet, Rfl: 3 .  montelukast (SINGULAIR) 10 MG tablet, Take 10 mg by mouth at bedtime., Disp: , Rfl:  .  Multiple Vitamins-Minerals (ICAPS PO), Take 1  capsule by mouth daily., Disp: , Rfl:  .  omeprazole (PRILOSEC) 40 MG capsule, Take 1 capsule (40 mg total) by mouth 2 (two) times daily., Disp: 60 capsule, Rfl: 5 .  potassium chloride SA (K-DUR,KLOR-CON) 20 MEQ tablet, Take 1 tablet (20 mEq total) by mouth daily., Disp: 90 tablet, Rfl: 3 .  tamsulosin (FLOMAX) 0.4 MG CAPS capsule, Take 0.4 mg by mouth 2 (two) times daily. For urinary symptoms , Disp: , Rfl:  .  sodium chloride HYPERTONIC 3 % nebulizer solution, Take by nebulization 2 (two) times daily., Disp: 300 mL, Rfl: 11

## 2017-11-10 NOTE — Patient Instructions (Addendum)
Allergic rhinitis: Keep taking singulair Use Neil Med rinses with distilled water at least twice per day using the instructions on the package. 1/2 hour after using the [facility] Med rinse, use Nasacort two puffs in each nostril once per day.  Remember that the Nasacort can take 1-2 weeks to work after regular use. Use generic zyrtec (cetirizine) every day.  If this doesn't help, then stop taking it and use chlorpheniramine-phenylephrine combination tablets.  Chronic sinusitis: We will check a CT scan of your sinuses  Pulmonary nodules: We will repeat a CT scan of your chest in 6 months  Pericardial effusion I will mention this to Dr. Servando Snare  COPD-Asthma: Keep taking Breo daily  Keep taking Singulair Use hypertonic saline nebulizer before bedtime Use albuterol as needed for chest tightness, wheezing or shortness of breath  Follow up in 6-8 weeks

## 2017-11-11 ENCOUNTER — Ambulatory Visit
Admission: RE | Admit: 2017-11-11 | Discharge: 2017-11-11 | Disposition: A | Discharge status: Home / Self Care | Payer: Medicare Other | Source: Ambulatory Visit | Attending: Cardiothoracic Surgery | Admitting: Cardiothoracic Surgery

## 2017-11-11 ENCOUNTER — Other Ambulatory Visit: Payer: Self-pay

## 2017-11-11 ENCOUNTER — Ambulatory Visit (INDEPENDENT_AMBULATORY_CARE_PROVIDER_SITE_OTHER)
Admission: RE | Admit: 2017-11-11 | Discharge: 2017-11-11 | Disposition: A | Discharge status: Home / Self Care | Payer: Medicare Other | Source: Ambulatory Visit | Attending: Pulmonary Disease | Admitting: Pulmonary Disease

## 2017-11-11 ENCOUNTER — Ambulatory Visit: Payer: Medicare Other | Admitting: Cardiology

## 2017-11-11 ENCOUNTER — Ambulatory Visit (HOSPITAL_COMMUNITY): Payer: Medicare Other | Attending: Cardiovascular Disease

## 2017-11-11 ENCOUNTER — Encounter: Payer: Self-pay | Admitting: Cardiothoracic Surgery

## 2017-11-11 ENCOUNTER — Encounter: Payer: Self-pay | Admitting: Cardiology

## 2017-11-11 ENCOUNTER — Ambulatory Visit (INDEPENDENT_AMBULATORY_CARE_PROVIDER_SITE_OTHER): Payer: Self-pay | Admitting: Cardiothoracic Surgery

## 2017-11-11 ENCOUNTER — Other Ambulatory Visit: Payer: Self-pay | Admitting: *Deleted

## 2017-11-11 VITALS — BP 120/66 | HR 112 | Ht 71.0 in | Wt 183.8 lb

## 2017-11-11 VITALS — BP 100/57 | HR 103 | Resp 18 | Ht 71.0 in | Wt 184.0 lb

## 2017-11-11 DIAGNOSIS — I2510 Atherosclerotic heart disease of native coronary artery without angina pectoris: Secondary | ICD-10-CM | POA: Diagnosis not present

## 2017-11-11 DIAGNOSIS — R0982 Postnasal drip: Secondary | ICD-10-CM | POA: Diagnosis not present

## 2017-11-11 DIAGNOSIS — E785 Hyperlipidemia, unspecified: Secondary | ICD-10-CM | POA: Insufficient documentation

## 2017-11-11 DIAGNOSIS — I313 Pericardial effusion (noninflammatory): Secondary | ICD-10-CM | POA: Diagnosis not present

## 2017-11-11 DIAGNOSIS — I10 Essential (primary) hypertension: Secondary | ICD-10-CM | POA: Diagnosis not present

## 2017-11-11 DIAGNOSIS — I714 Abdominal aortic aneurysm, without rupture: Secondary | ICD-10-CM | POA: Diagnosis not present

## 2017-11-11 DIAGNOSIS — Z951 Presence of aortocoronary bypass graft: Secondary | ICD-10-CM | POA: Insufficient documentation

## 2017-11-11 DIAGNOSIS — I700 Atherosclerosis of aorta: Secondary | ICD-10-CM

## 2017-11-11 DIAGNOSIS — I480 Paroxysmal atrial fibrillation: Secondary | ICD-10-CM | POA: Diagnosis not present

## 2017-11-11 DIAGNOSIS — I252 Old myocardial infarction: Secondary | ICD-10-CM | POA: Diagnosis not present

## 2017-11-11 DIAGNOSIS — I314 Cardiac tamponade: Secondary | ICD-10-CM | POA: Diagnosis not present

## 2017-11-11 DIAGNOSIS — I3139 Other pericardial effusion (noninflammatory): Secondary | ICD-10-CM

## 2017-11-11 DIAGNOSIS — Z789 Other specified health status: Secondary | ICD-10-CM

## 2017-11-11 LAB — ECHOCARDIOGRAM COMPLETE
Height: 71 in
Weight: 2944 oz

## 2017-11-11 NOTE — Progress Notes (Signed)
Cardiology Office Note    Date:  11/11/2017   ID:  Tim [name], DOB 16-Dec-1928, MRN [id number]  PCP:  Tim Bott, MD  Cardiologist:   Tim Furbish, MD     History of Present Illness:  Tim [name] is a 84 y.o. male here for follow-up, former patient of Dr. Ron [name] with bilateral carotid artery disease,, coronary artery disease status post STEMI in 2001 with PCI to RCA, residual 100% circumflex, subsequent PCI for in-stent restenosis in June 2001, nuclear in 2014 with no scar, no ischemia 62%. He has had trouble with statins in the past, elevated CPK in the past. Former smoker quit in 1980. Here for follow up.  Prior neck surgery.  07/27/17-he was seen at [facility] with minimally elevated troponin, increased slightly.  This was thought to be secondary to elevated blood pressure and demand ischemia.  He was placed on a heparin shot.  He states.  His blood pressure was as high as 381 systolic.  This was unusual for him.  His troponin was 0.07.  Down trended.  EKG showed no changes.  It was recommended that he consider a nuclear stress test in the future, most recent was 2014 and was normal.  Finished a course of Z-Pak.  11/11/17 - Back in Nov of 2018 after his demand ischemia episode, he went for cath and this showed 3v disease, including sequential 70% and 50% proximal/mid LAD stenoses as well as chronic total occlusion of the proximal/mid LCx and distal RCA.,Moderate to severe in-stent restenosis in the mid/distal RCA. Pt then was evaluated by Dr. Servando [name] and pulmonary with Dr. Lake [name] for COPD eval for surgery. He has paralyzed Rt hemidiaphragm.   He underwent CABG X 4 08/24/17 with LIMA to LAD, VG to diagonal, VG to LCX, VG to PDA Discharged 09/03/17.   Complicated by PAF post op, amio. Pericarditis (T wave peaking on ECG). Wheezing on atenolol, changed to metoprolol. Dizzy with this. Edema. Stopped taking it. Tingling in chest. No pain since surgery he says. He  wants to get back to work, [employer]...   Past Medical History:  Diagnosis Date  . AAA (abdominal aortic aneurysm) (HCC)    3.2 cm 07/2016; 2-3 year f/u recommended  . Asthma   . BPH (benign prostatic hypertrophy)   . CAD (coronary artery disease)    9 STEMI 2001, PCI, RCA, residual 100% circumflex  /   PCI for in-stent restenosis June, 2001  /  nuclear February, 2010, no ischemia  . Cancer ([place])    skin cancer removed  . Carotid artery disease ([place])    Doppler, June, 2011, 0-39% bilateral, pt denies  . Cervical disc disease    Status post neurosurgery  . Colon polyps   . Ejection fraction    EF 55-65%, echo, 2009  . Elevated CPK    Chronic mild CPK elevation  . Emphysema    Mild  . Fatigue    Morning fatigue, August, 2011  . GERD (gastroesophageal reflux disease)   . Headache    back of head  . Hyperlipidemia   . Hypertension   . Low back pain    February, 2013  . Myocardial infarction ([place])   . Paralyzed hemidiaphragm    Right  . Paralyzed hemidiaphragm    Chronic  . Statin intolerance    Elevated CPK in the past    Past Surgical History:  Procedure Laterality Date  . BREAST SURGERY     right breast removed  .  CERVICAL DISC SURGERY    . CORONARY ARTERY BYPASS GRAFT N/A 08/24/2017   Procedure: CORONARY ARTERY BYPASS GRAFTING times four using left internal mammary artery and bilateral saphenous vein, using endoscope. TEE;  Surgeon: Tim Isaac, MD;  Location: [facility];  Service: Open Heart Surgery;  Laterality: N/A;  . LEFT HEART CATH AND CORONARY ANGIOGRAPHY N/A 07/29/2017   Procedure: LEFT HEART CATH AND CORONARY ANGIOGRAPHY;  Surgeon: Tim Bush, MD;  Location: [facility];  Service: Cardiovascular;  Laterality: N/A;  . SKIN CANCER EXCISION    . TEE WITHOUT CARDIOVERSION N/A 08/24/2017   Procedure: TRANSESOPHAGEAL ECHOCARDIOGRAM (TEE);  Surgeon: Tim Isaac, MD;  Location: [facility];  Service: Open Heart Surgery;  Laterality: N/A;     Current Medications: Outpatient Medications Prior to Visit  Medication Sig Dispense Refill  . albuterol (PROVENTIL HFA;VENTOLIN HFA) 108 (90 BASE) MCG/ACT inhaler Inhale 2 puffs into the lungs every 6 (six) hours as needed for wheezing or shortness of breath. 1 Inhaler 6  . albuterol (PROVENTIL) (2.5 MG/3ML) 0.083% nebulizer solution Take 3 mLs (2.5 mg total) by nebulization every 6 (six) hours as needed for wheezing or shortness of breath. 75 mL 12  . aspirin EC 81 MG tablet Take 81 mg by mouth daily.    [name] diltiazem (CARDIZEM CD) 120 MG 24 hr capsule Take 1 capsule (120 mg total) by mouth daily. 90 capsule 3  . doxycycline (PERIOSTAT) 20 MG tablet Take 20 mg by mouth daily.    . finasteride (PROSCAR) 5 MG tablet Take 5 mg by mouth 2 (two) times daily.     . fluticasone furoate-vilanterol (BREO ELLIPTA) 100-25 MCG/INH AEPB Inhale 1 puff into the lungs daily. 1 each 0  . furosemide (LASIX) 40 MG tablet Take 1 tablet (40 mg total) by mouth daily. 90 tablet 3  . montelukast (SINGULAIR) 10 MG tablet Take 10 mg by mouth at bedtime.    . Multiple Vitamins-Minerals (ICAPS PO) Take 1 capsule by mouth daily.    [name] omeprazole (PRILOSEC) 40 MG capsule Take 1 capsule (40 mg total) by mouth 2 (two) times daily. 60 capsule 5  . potassium chloride SA (K-DUR,KLOR-CON) 20 MEQ tablet Take 1 tablet (20 mEq total) by mouth daily. 90 tablet 3  . sodium chloride HYPERTONIC 3 % nebulizer solution Take by nebulization 2 (two) times daily. 300 mL 11  . tamsulosin (FLOMAX) 0.4 MG CAPS capsule Take 0.4 mg by mouth 2 (two) times daily. For urinary symptoms      No facility-administered medications prior to visit.      Allergies:   Statins   Social History   Socioeconomic History  . Marital status: Married    Spouse name: None  . Number of children: None  . Years of education: None  . Highest education level: None  Social Needs  . Financial resource strain: None  . Food insecurity - worry: None  . Food  insecurity - inability: None  . Transportation needs - medical: None  . Transportation needs - non-medical: None  Occupational History  . Occupation: retired Games developer  Tobacco Use  . Smoking status: Former Smoker    Packs/day: 3.00    Years: 40.00    Pack years: 120.00    Types: Cigarettes    Last attempt to quit: 09/15/1975    Years since quitting: 42.1  . Smokeless tobacco: Never Used  Substance and Sexual Activity  . Alcohol use: No    Frequency: Never  . Drug use:  No  . Sexual activity: None  Other Topics Concern  . None  Social History Narrative  . None     Family History:  The patient's family history includes Cancer in his brother; Diabetes in his mother and sister; Kidney disease in his brother; Other in his father and sister.   ROS:   Please see the history of present illness.    Review of Systems  All other systems reviewed and are negative.     PHYSICAL EXAM:   VS:  BP 120/66   Pulse (!) 112   Ht 5\' 11"  (1.803 m)   Wt 183 lb 12.8 oz (83.4 kg)   SpO2 95%   BMI 25.63 kg/m    GEN: Well nourished, well developed, in no acute distress  HEENT: normal  Neck: no JVD, carotid bruits, or masses Cardiac: reg tachy; no murmurs, rubs, or gallops,mild B edema  Respiratory:  Poor air movement bilaterally, mild wheeze posterior, normal work of breathing GI: soft, nontender, nondistended, + BS MS: no deformity or atrophy  Skin: warm and dry, no rash Neuro:  Alert and Oriented x 3, Strength and sensation are intact Psych: euthymic mood, full affect    Wt Readings from Last 3 Encounters:  11/11/17 183 lb 12.8 oz (83.4 kg)  11/10/17 183 lb 6.4 oz (83.2 kg)  10/11/17 191 lb 12.8 oz (87 kg)      Studies/Labs Reviewed:   EKG:   07/27/17-sinus rhythm specific ST-T wave changes, mild peaking of T waves in V2.  Personally viewed-prior sinus rhythm, 65, J-point elevation precordial leads, no significant change from prior EKG. Personally viewed.  Recent  Labs: 08/20/2017: ALT 18 08/25/2017: Magnesium 2.5 08/31/2017: TSH 2.942 09/28/2017: Hemoglobin 9.2; Platelets 324.0 10/11/2017: BUN 18; Creatinine, Ser 1.27; Potassium 4.7; Sodium 142   Lipid Panel No results found for: CHOL, TRIG, HDL, CHOLHDL, VLDL, LDLCALC, LDLDIRECT  Additional studies/ records that were reviewed today include:  Prior office notes reviewed, lab work reviewed  Cardiac cath 07/29/17 .Conclusion   Conclusions: 1. Significant three-vessel coronary artery disease, including sequential 70% and 50% proximal/mid LAD stenoses as well as chronic total occlusion of the proximal/mid LCx and distal RCA. 2. Moderate to severe in-stent restenosis in the mid/distal RCA. 3. Normal left ventricular contraction and filling pressure.  Recommendations: 1. Cardiac surgery consultation, given significant three vessel coronary artery disease. OM3 and rPDA are supplied by collaterals. 2. If patient is not a surgical candidate, PCI to proximal/mid LAD could be considered, with continued medical management of chronic total occlusions of the LCx and RCA. 3. Aggressive secondary prevention. Consider retrial of statin versus evaluation for PCSK9 inhibitor therapy. 4. Continue atenolol and isosorbide mononitrate, to be uptitrated as tolerated.     ECHO 08/20/17 Study Conclusions  - Left ventricle: The cavity size was normal. Systolic function was normal. The estimated ejection fraction was in the range of 55% to 60%. Probable hypokinesis of the basal-midinferolateral and inferior myocardium; consistent with ischemia in the distribution of the right coronary or left circumflex coronary artery. Doppler parameters are consistent with abnormal left ventricular relaxation (grade 1 diastolic dysfunction). - Left atrium: The atrium was mildly dilated.  Vas dopplers 08/20/17 Final Interpretation: Right Carotid: There is evidence in the right ICA of a 1-39% stenosis.  Left  Carotid: There is evidence in the left ICA of a 40-59% stenosis. Vertebrals: Both vertebral arteries were patent with antegrade flow. Subclavians:  Right ABI: Resting right ankle-brachial index is within normal range. No evidence of  significant right lower extremity arterial disease. Left ABI: Resting left ankle-brachial index indicates mild right lower extremity arterial disease. Right Upper Extremity: No significant arterial obstruction detected in the right upper extremity. Doppler waveform obliterate with compression. Doppler waveforms remain within normal limits with compression. Left Upper Extremity: No significant arterial obstruction detected in the left upper extremity. Doppler waveform obliterate with compression. Doppler waveforms decrease <50% w/compression.   CABG 08/24/17 CORONARY ARTERY BYPASS GRAFTINGx 4 (LIMA to LAD, SVG to DIAGONAL, SVG to CIRCUMFLEX, SVG to PDA)using left internal mammary artery and bilateral greatersaphenous veinvia EVH       ASSESSMENT:    1. Pericardial effusion   2. CAD, multiple vessel   3. S/P CABG x 4   4. PAF (paroxysmal atrial fibrillation) (HCC)   5. Statin intolerance   6. Aortic atherosclerosis (HCC)      PLAN:  In order of problems listed above:  Coronary artery disease post  CABG X 4 LIMA to LAD, SVG to DIAGONAL, SVG to CIRCUMFLEX, SVG to PDA)using left internal mammary artery and bilateral greatersaphenous veinvia EVH.  - History of STEMI in 2001, right coronary artery stent placement.  - Feeling better he states.  Main complaint is continued mucus production from his COPD/bronchitis.  Still coughing.  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  PAF  - amio stopped post op because of nausea.   - Dilt cd 120. Dizzy with metoprolol, wheeze with atenolol.   - Not on anticoagulation. Last ECG sinus rhythm.  Now currently in sinus rhythm.  COPD  - Pulmonary medicine, Dr. Lake [name] he  is seeing, PPI, singulair. Increased mucus.   Carotid artery disease  - Moderate, bilateral, watching with carotid Dopplers.  - Carotid duplex on 07/24/16 showed moderate disease 50-69% bilaterally  Statin intolerance/hyperlipidemia  - Unable to take statins. Dr. Ron [name] tried several he says. Used to make legs weak. Will have him see lipid clinic. ?once a week [facility]. ?PCSK9. Dr. Heber  checks lipids.   Mildly dilated abdominal aorta/aortic atherosclerosis  - 3.2 cm, minimal. Should be of no clinical consequence. Iliac artery showed mild calcified plaque. Mild aortic atherosclerosis seen.  Pericardial effusion  - noted on pulm CT scan. Reported as large along LV 8cm. Unusual. Will check ECHO. Dr. Servando [name] aware. Will clarify with echo.   [name] in 3 months, me 6.   Medication Adjustments/Labs and Tests Ordered: Current medicines are reviewed at length with the patient today.  Concerns regarding medicines are outlined above.  Medication changes, Labs and Tests ordered today are listed in the Patient Instructions below. Patient Instructions  Medication Instructions:  The current medical regimen is effective;  continue present plan and medications.  Testing/Procedures: Your physician has requested that you have an echocardiogram. Echocardiography is a painless test that uses sound waves to create images of your heart. It provides your doctor with information about the size and shape of your heart and how well your heart's chambers and valves are working. This procedure takes approximately one hour. There are no restrictions for this procedure.  You are being referred to the [facility].  Follow-Up: Follow up in 3 months with Cecilie Kicks, NP.  You will receive a letter in the mail 2 months before you are due.  Please call us when you receive this letter to schedule your follow up appointment.  Follow up in 6 months with Dr. Marlou Porch.  You will receive a letter in the mail 2  months before  you are due.  Please call us when you receive this letter to schedule your follow up appointment.  If you need a refill on your cardiac medications before your next appointment, please call your pharmacy.  Thank you for choosing [facility]!!        Signed, Tim Furbish, MD  11/11/2017 8:32 AM    [address], [place], [place]  [address] Phone: [phone]; Fax: [phone]

## 2017-11-11 NOTE — Progress Notes (Signed)
[address]       [place] [address]             [phone]      Marvyn L Ahola [facility] Medical Record #[id number] Date of Birth: 1929/06/20  Referring: Nelva Bush, MD Primary Care: Raelene Bott, MD Primary Cardiologist: Candee Furbish, MD   Chief Complaint:   POST OP FOLLOW UP 08/24/2017 OPERATIVE REPORT PREOPERATIVE DIAGNOSIS:  Coronary occlusive disease with angina. POSTOPERATIVE DIAGNOSIS:  Coronary occlusive disease with angina. SURGICAL PROCEDURES:  Coronary artery bypass grafting x4 with the left internal mammary to the left anterior descending coronary artery, reversed saphenous vein graft to the diagonal coronary artery, reversed saphenous vein graft to the distal circumflex coronary artery, reversed saphenous vein graft to the posterior descending coronary artery with bilateral right and left greater saphenous endoscopic vein harvesting. SURGEON:  Lanelle Bal, MD.   History of Present Illness:     Patient doing well postoperatively he still remains short of breath with exertion consistent with his severe underlying lung disease.  He does note that he has been working around his [employer], academic but not doing any heavy lifting.  He has had no chest discomfort.      Past Medical History:  Diagnosis Date  . AAA (abdominal aortic aneurysm) (HCC)    3.2 cm 07/2016; 2-3 year f/u recommended  . Asthma   . BPH (benign prostatic hypertrophy)   . CAD (coronary artery disease)    9 STEMI 2001, PCI, RCA, residual 100% circumflex  /   PCI for in-stent restenosis June, 2001  /  nuclear February, 2010, no ischemia  . Cancer ([facility])    skin cancer removed  . Carotid artery disease ([facility])    Doppler, June, 2011, 0-39% bilateral, pt denies  . Cervical disc disease    Status post neurosurgery  . Colon polyps   . Ejection fraction    EF 55-65%, echo, 2009  . Elevated CPK    Chronic mild CPK elevation  . Emphysema    Mild  .  Fatigue    Morning fatigue, August, 2011  . GERD (gastroesophageal reflux disease)   . Headache    back of head  . Hyperlipidemia   . Hypertension   . Low back pain    February, 2013  . Myocardial infarction ([facility])   . Paralyzed hemidiaphragm    Right  . Paralyzed hemidiaphragm    Chronic  . Statin intolerance    Elevated CPK in the past     Social History   Tobacco Use  Smoking Status Former Smoker  . Packs/day: 3.00  . Years: 40.00  . Pack years: 120.00  . Types: Cigarettes  . Last attempt to quit: 09/15/1975  . Years since quitting: 42.1  Smokeless Tobacco Never Used    Social History   Substance and Sexual Activity  Alcohol Use No  . Frequency: Never     Allergies  Allergen Reactions  . Statins Other (See Comments)    CLASS EFFECT SEVERE LEG CRAMPS [MULTIPLE STATINS]    Current Outpatient Medications  Medication Sig Dispense Refill  . albuterol (PROVENTIL HFA;VENTOLIN HFA) 108 (90 BASE) MCG/ACT inhaler Inhale 2 puffs into the lungs every 6 (six) hours as needed for wheezing or shortness of breath. 1 Inhaler 6  . albuterol (PROVENTIL) (2.5 MG/3ML) 0.083% nebulizer solution Take 3 mLs (2.5 mg total) by nebulization every 6 (six) hours as needed for wheezing or shortness of breath.  75 mL 12  . aspirin EC 81 MG tablet Take 81 mg by mouth daily.    [name] diltiazem (CARDIZEM CD) 120 MG 24 hr capsule Take 1 capsule (120 mg total) by mouth daily. 90 capsule 3  . doxycycline (PERIOSTAT) 20 MG tablet Take 20 mg by mouth daily.    . finasteride (PROSCAR) 5 MG tablet Take 5 mg by mouth 2 (two) times daily.     . fluticasone furoate-vilanterol (BREO ELLIPTA) 100-25 MCG/INH AEPB Inhale 1 puff into the lungs daily. 1 each 0  . furosemide (LASIX) 40 MG tablet Take 1 tablet (40 mg total) by mouth daily. 90 tablet 3  . montelukast (SINGULAIR) 10 MG tablet Take 10 mg by mouth at bedtime.    . Multiple Vitamins-Minerals (ICAPS PO) Take 1 capsule by mouth daily.    [name] omeprazole  (PRILOSEC) 40 MG capsule Take 1 capsule (40 mg total) by mouth 2 (two) times daily. 60 capsule 5  . potassium chloride SA (K-DUR,KLOR-CON) 20 MEQ tablet Take 1 tablet (20 mEq total) by mouth daily. 90 tablet 3  . sodium chloride HYPERTONIC 3 % nebulizer solution Take by nebulization 2 (two) times daily. 300 mL 11  . tamsulosin (FLOMAX) 0.4 MG CAPS capsule Take 0.4 mg by mouth 2 (two) times daily. For urinary symptoms      No current facility-administered medications for this visit.        Physical Exam: BP (!) 100/57 (BP Location: Left Arm, Patient Position: Sitting, Cuff Size: Large)   Pulse (!) 103   Resp 18   Ht 5\' 11"  (1.803 m)   Wt 184 lb (83.5 kg)   SpO2 94% Comment: RA  BMI 25.66 kg/m   General appearance: alert and cooperative Neurologic: intact Heart: regular rate and rhythm, S1, S2 normal, no murmur, click, rub or gallop Lungs: clear to auscultation bilaterally Abdomen: soft, non-tender; bowel sounds normal; no masses,  no organomegaly Extremities: extremities normal, atraumatic, no cyanosis or edema and Homans sign is negative, no sign of DVT Wound: Incisions are stable and healing well without evidence of infection   Diagnostic Studies & Laboratory data:     Recent Radiology Findings:   Dg Chest 2 View  Result Date: 11/11/2017 CLINICAL DATA:  History of CABG 08/24/2017.  No chest complaints. EXAM: CHEST  2 VIEW COMPARISON:  11/10/2017. FINDINGS: There is small area of scarring in the right lower lobe and lingula. There is no focal consolidation. There is no pleural effusion or pneumothorax. The heart and mediastinal contours are unremarkable. There is evidence of prior CABG. The osseous structures are unremarkable. IMPRESSION: No active cardiopulmonary disease. Electronically Signed   By: Kathreen Devoid   On: 11/11/2017 10:00   Ct Chest Wo Contrast  Result Date: 11/10/2017 CLINICAL DATA:  Productive cough and shortness of breath for 2 months. COPD. Asbestos exposure.  Follow-up pulmonary nodules. EXAM: CT CHEST WITHOUT CONTRAST TECHNIQUE: Multidetector CT imaging of the chest was performed following the standard protocol without IV contrast. COMPARISON:  08/13/2017 and 11/16/1998 and FINDINGS: Cardiovascular: Aortic and coronary artery atherosclerosis. Patient has undergone CABG since prior study. A loculated fluid collection is seen along the posterior left heart border which is new since previous study and measures 8.9 x 3.4 cm. This is consistent with a loculated pericardial effusion, and demonstrates mass effect on the left ventricle. Mediastinum/Nodes: No masses or pathologically enlarged lymph nodes identified on this unenhanced exam. Lungs/Pleura: Mild emphysema again seen. New atelectasis is seen both lower lungs. No  evidence pleural effusion. No evidence of calcified pleural plaque. Right upper lobe ground-glass nodule measuring 11 mm shows no significant change compared to previous study in 2008. A ground-glass nodule is seen in the left upper lobe measuring 12 mm on image 54/3, which is new since recent study 3 months ago, and is consistent with inflammatory etiology given its rapid appearance since the recent exam. No evidence of pulmonary consolidation or pleural effusion. Upper Abdomen:  Unremarkable. Musculoskeletal:  No suspicious bone lesions. IMPRESSION: Interval CABG. Moderate to large loculated pericardial effusion along posterior left heart border, with mass effect on left ventricle. Increased bilateral lower lung atelectasis. Stable right upper lobe ground-glass nodule compared to previous studies dating back to 2008. New 12 mm left upper lobe ground-glass nodule since recent study 3 months ago, most consistent with inflammatory etiology. Recommend follow-up by chest CT without contrast in 6 months to confirm resolution. Aortic Atherosclerosis (ICD10-I70.0) and Emphysema (ICD10-J43.9). Electronically Signed   By: Earle Gell M.D.   On: 11/10/2017 09:31    Ct Maxillofacial Ltd Wo Cm  Result Date: 11/11/2017 CLINICAL DATA:  Postnasal drip EXAM: CT PARANASAL SINUS LIMITED WITHOUT CONTRAST TECHNIQUE: Non-contiguous multidetector CT images of the paranasal sinuses were obtained in a single plane without contrast. COMPARISON:  None. FINDINGS: Clear paranasal sinuses. Essentially midline nasal septum. No incidental osseous, orbital, or intracranial finding. IMPRESSION: Negative exam.  Clear paranasal sinuses. Electronically Signed   By: Monte Fantasia M.D.   On: 11/11/2017 09:39      Recent Lab Findings: Lab Results  Component Value Date   WBC 10.1 09/28/2017   HGB 9.2 (L) 09/28/2017   HCT 28.6 (L) 09/28/2017   PLT 324.0 09/28/2017   GLUCOSE 102 (H) 10/11/2017   ALT 18 08/20/2017   AST 13 (L) 08/20/2017   NA 142 10/11/2017   K 4.7 10/11/2017   CL 104 10/11/2017   CREATININE 1.27 10/11/2017   BUN 18 10/11/2017   CO2 25 10/11/2017   TSH 2.942 08/31/2017   INR 1.39 08/24/2017   HGBA1C 6.4 (H) 08/20/2017   Transthoracic Echocardiography  Patient:    Marlos, Carmen MR #:       [id number] Study Date: 11/11/2017 Gender:     M Age:        84 Height:     180.3 cm Weight:     83.5 kg BSA:        2.05 m^2 Pt. Status: Room:   ATTENDING    Jenkins Rouge, M.D.  ORDERING     Lanelle Bal MD  REFERRING    Lanelle Bal MD  SONOGRAPHER  Marygrace Drought, RCS  PERFORMING   [facility], Outpatient  cc:  ------------------------------------------------------------------- LV EF: 60% -   65%  ------------------------------------------------------------------- Indications:      Cardiac Tamponade (I31.4).  ------------------------------------------------------------------- History:   PMH:  Hyperlipidemia, AAA.  PMH:   Myocardial infarction.  Risk factors:  Hypertension.  ------------------------------------------------------------------- Study Conclusions  - Left ventricle: The cavity size was normal. Wall thickness was    normal. Systolic function was normal. The estimated ejection   fraction was in the range of 60% to 65%. Doppler parameters are   consistent with both elevated ventricular end-diastolic filling   pressure and elevated left atrial filling pressure. - Left atrium: The atrium was mildly dilated. - Atrial septum: No defect or patent foramen ovale was identified. - Pericardium, extracardiac: Moderate localized posterior lateral   pericardial effusion no tamponade.  ------------------------------------------------------------------- Labs, prior tests, procedures, and surgery: Coronary  artery bypass grafting.  ------------------------------------------------------------------- Study data:  Comparison was made to the study of 08/20/2017.  Study status:  Routine.  Procedure:  The patient reported no pain pre or post test. Transthoracic echocardiography. Image quality was adequate.          Transthoracic echocardiography.  M-mode, complete 2D, spectral Doppler, and color Doppler.  Birthdate: Patient birthdate: Mar 12, 1929.  Age:  Patient is 84 yr old.  Sex: Gender: male.    BMI: 25.7 kg/m^2.  Blood pressure:     100/57 Patient status:  Outpatient.  Study date:  Study date: 11/11/2017. Study time: 01:12 PM.  Location:  [facility]  -------------------------------------------------------------------  ------------------------------------------------------------------- Left ventricle:  The cavity size was normal. Wall thickness was normal. Systolic function was normal. The estimated ejection fraction was in the range of 60% to 65%. Doppler parameters are consistent with both elevated ventricular end-diastolic filling pressure and elevated left atrial filling pressure.  ------------------------------------------------------------------- Aortic valve:   Trileaflet; moderately thickened, moderately calcified leaflets. Sclerosis without  stenosis.  ------------------------------------------------------------------- Aorta:  The aorta was normal, not dilated, and non-diseased.  ------------------------------------------------------------------- Mitral valve:   Doppler:  There was no significant regurgitation.  Peak gradient (D): 6 mm Hg.  ------------------------------------------------------------------- Left atrium:  The atrium was mildly dilated.  ------------------------------------------------------------------- Atrial septum:  No defect or patent foramen ovale was identified.   ------------------------------------------------------------------- Right ventricle:  The cavity size was normal. Wall thickness was normal. Systolic function was normal.  ------------------------------------------------------------------- Pulmonic valve:    Doppler:  There was mild regurgitation.  ------------------------------------------------------------------- Tricuspid valve:   Doppler:  There was mild regurgitation.  ------------------------------------------------------------------- Right atrium:  The atrium was normal in size.  ------------------------------------------------------------------- Pericardium:  Moderate localized posterior lateral pericardial effusion no tamponade.  ------------------------------------------------------------------- Systemic veins: Inferior vena cava: The vessel was normal in size. The respirophasic diameter changes were in the normal range (= 50%), consistent with normal central venous pressure. Diameter: 18 mm.  ------------------------------------------------------------------- Post procedure conclusions Ascending Aorta:  - The aorta was normal, not dilated, and non-diseased.  ------------------------------------------------------------------- Measurements   IVC                                        Value        Reference  ID                                         18     mm     ----------    Left ventricle                             Value        Reference  LV ID, ED, PLAX chordal            (L)     38.7  mm     43 - 52  LV ID, ES, PLAX chordal                    29.8  mm     23 - 38  LV fx shortening, PLAX chordal     (L)     23    %      >=29  LV PW thickness, ED  14.7  mm     ----------  IVS/LV PW ratio, ED                        0.76         <=1.3  Stroke volume, 2D                          88    ml     ----------  Stroke volume/bsa, 2D                      43    ml/m^2 ----------  LV e&', lateral                             6.64  cm/s   ----------  LV E/e&', lateral                           18.52        ----------  LV e&', medial                              7.83  cm/s   ----------  LV E/e&', medial                            15.71        ----------  LV e&', average                             7.24  cm/s   ----------  LV E/e&', average                           17           ----------    Ventricular septum                         Value        Reference  IVS thickness, ED                          11.2  mm     ----------    LVOT                                       Value        Reference  LVOT ID, S                                 21    mm     ----------  LVOT area                                  3.46  cm^2   ----------  LVOT peak velocity, S                      116   cm/s   ----------  LVOT mean velocity, S                      81.9  cm/s   ----------  LVOT VTI, S                                25.5  cm     ----------  LVOT peak gradient, S                      5     mm Hg  ----------    Aorta                                      Value        Reference  Aortic root ID, ED                         35    mm     ----------    Left atrium                                Value        Reference  LA ID, A-P, ES                             40    mm     ----------  LA ID/bsa, A-P                             1.95  cm/m^2 <=2.2   LA volume, S                               46.7  ml     ----------  LA volume/bsa, S                           22.7  ml/m^2 ----------  LA volume, ES, 1-p A4C                     32.2  ml     ----------  LA volume/bsa, ES, 1-p A4C                 15.7  ml/m^2 ----------  LA volume, ES, 1-p A2C                     65.9  ml     ----------  LA volume/bsa, ES, 1-p A2C                 32.1  ml/m^2 ----------    Mitral valve                               Value        Reference  Mitral E-wave peak velocity                123   cm/s   ----------  Mitral A-wave peak velocity  88.3  cm/s   ----------  Mitral deceleration time                   197   ms     150 - 230  Mitral peak gradient, D                    6     mm Hg  ----------  Mitral E/A ratio, peak                     1.4          ----------    Pulmonary arteries                         Value        Reference  PA pressure, S, DP                         18    mm Hg  <=30    Tricuspid valve                            Value        Reference  Tricuspid regurg peak velocity             193   cm/s   ----------  Tricuspid peak RV-RA gradient              15    mm Hg  ----------  Tricuspid maximal regurg velocity,         193   cm/s   ----------  PISA    Right atrium                               Value        Reference  RA ID, S-I, ES, A4C                (H)     59    mm     34 - 49  RA area, ES, A4C                           17.4  cm^2   8.3 - 19.5  RA volume, ES, A/L                         41.8  ml     ----------  RA volume/bsa, ES, A/L                     20.3  ml/m^2 ----------    Systemic veins                             Value        Reference  Estimated CVP                              3     mm Hg  ----------    Right ventricle                            Value  Reference  TAPSE                                      14.7  mm     ----------  RV pressure, S, DP                         18    mm Hg  <=30  RV s&',  lateral, S                          8.81  cm/s   ----------  Legend: (L)  and  (H)  mark values outside specified reference range.  ------------------------------------------------------------------- Prepared and Electronically Authenticated by  Jenkins Rouge, M.D. 2019-02-28T16:25:03   Assessment / Plan:      Patient doing well following coronary artery bypass grafting, CT and echocardiogram shows post anterior pericardial effusion without evidence of tamponade patient has no symptoms of tamponade.  We will continue monitoring.  He is not currently on anticoagulation Otherwise the patient is making good progress following high risk coronary artery bypass grafting in an 84 year old male with significant underlying chronic pulmonary disease. We will plan to see him back in 3-4 weeks      Grace Isaac MD      [address] [place] [address] Office [phone]   Beeper [phone]  11/11/2017 11:05 AM

## 2017-11-11 NOTE — Patient Instructions (Signed)
Medication Instructions:  The current medical regimen is effective;  continue present plan and medications.  Testing/Procedures: Your physician has requested that you have an echocardiogram. Echocardiography is a painless test that uses sound waves to create images of your heart. It provides your doctor with information about the size and shape of your heart and how well your heart's chambers and valves are working. This procedure takes approximately one hour. There are no restrictions for this procedure.  You are being referred to the [facility].  Follow-Up: Follow up in 3 months with Cecilie Kicks, NP.  You will receive a letter in the mail 2 months before you are due.  Please call us when you receive this letter to schedule your follow up appointment.  Follow up in 6 months with Dr. Marlou Porch.  You will receive a letter in the mail 2 months before you are due.  Please call us when you receive this letter to schedule your follow up appointment.  If you need a refill on your cardiac medications before your next appointment, please call your pharmacy.  Thank you for choosing [facility]!!

## 2017-11-15 LAB — RESPIRATORY CULTURE OR RESPIRATORY AND SPUTUM CULTURE
MICRO NUMBER:: 90256579
SPECIMEN QUALITY:: ADEQUATE

## 2017-11-25 ENCOUNTER — Ambulatory Visit: Payer: Medicare Other | Admitting: Cardiothoracic Surgery

## 2017-11-25 ENCOUNTER — Other Ambulatory Visit: Payer: Self-pay

## 2017-11-25 ENCOUNTER — Encounter: Payer: Self-pay | Admitting: Cardiothoracic Surgery

## 2017-11-25 VITALS — BP 100/59 | HR 92 | Resp 20 | Ht 71.0 in | Wt 185.8 lb

## 2017-11-25 DIAGNOSIS — I2510 Atherosclerotic heart disease of native coronary artery without angina pectoris: Secondary | ICD-10-CM | POA: Diagnosis not present

## 2017-11-25 DIAGNOSIS — Z951 Presence of aortocoronary bypass graft: Secondary | ICD-10-CM | POA: Diagnosis not present

## 2017-11-25 NOTE — Progress Notes (Signed)
[facility] [address]       [place] [address]             [phone]      Tim [name] [facility] Medical Record #[id number] Date of Birth: April 11, 1929  Referring: Nelva Bush, MD Primary Care: Raelene Bott, MD Primary Cardiologist: Candee Furbish, MD   Chief Complaint:   POST OP FOLLOW UP 08/24/2017 OPERATIVE REPORT PREOPERATIVE DIAGNOSIS:  Coronary occlusive disease with angina. POSTOPERATIVE DIAGNOSIS:  Coronary occlusive disease with angina. SURGICAL PROCEDURES:  Coronary artery bypass grafting x4 with the left internal mammary to the left anterior descending coronary artery, reversed saphenous vein graft to the diagonal coronary artery, reversed saphenous vein graft to the distal circumflex coronary artery, reversed saphenous vein graft to the posterior descending coronary artery with bilateral right and left greater saphenous endoscopic vein harvesting. SURGEON:  Lanelle Bal, MD.   History of Present Illness:     Patient doing well postoperatively he still remains short of breath with exertion consistent with his severe underlying lung disease.  Patient's had no chest pain.  Notes that since discharge home he is only used 2 pain pills. He continues to increase his physical activity appropriately. Past Medical History:  Diagnosis Date  . AAA (abdominal aortic aneurysm) (HCC)    3.2 cm 07/2016; 2-3 year f/u recommended  . Asthma   . BPH (benign prostatic hypertrophy)   . CAD (coronary artery disease)    9 STEMI 2001, PCI, RCA, residual 100% circumflex  /   PCI for in-stent restenosis June, 2001  /  nuclear February, 2010, no ischemia  . Cancer ([place])    skin cancer removed  . Carotid artery disease ([place])    Doppler, June, 2011, 0-39% bilateral, pt denies  . Cervical disc disease    Status post neurosurgery  . Colon polyps   . Ejection fraction    EF 55-65%, echo, 2009  . Elevated CPK    Chronic mild CPK elevation  . Emphysema    Mild    . Fatigue    Morning fatigue, August, 2011  . GERD (gastroesophageal reflux disease)   . Headache    back of head  . Hyperlipidemia   . Hypertension   . Low back pain    February, 2013  . Myocardial infarction ([place])   . Paralyzed hemidiaphragm    Right  . Paralyzed hemidiaphragm    Chronic  . Statin intolerance    Elevated CPK in the past     Social History   Tobacco Use  Smoking Status Former Smoker  . Packs/day: 3.00  . Years: 40.00  . Pack years: 120.00  . Types: Cigarettes  . Last attempt to quit: 09/15/1975  . Years since quitting: 42.2  Smokeless Tobacco Never Used    Social History   Substance and Sexual Activity  Alcohol Use No  . Frequency: Never     Allergies  Allergen Reactions  . Statins Other (See Comments)    CLASS EFFECT SEVERE LEG CRAMPS [MULTIPLE STATINS]    Current Outpatient Medications  Medication Sig Dispense Refill  . albuterol (PROVENTIL HFA;VENTOLIN HFA) 108 (90 BASE) MCG/ACT inhaler Inhale 2 puffs into the lungs every 6 (six) hours as needed for wheezing or shortness of breath. 1 Inhaler 6  . albuterol (PROVENTIL) (2.5 MG/3ML) 0.083% nebulizer solution Take 3 mLs (2.5 mg total) by nebulization every 6 (six) hours as needed for wheezing or shortness of breath. 75 mL 12  .  aspirin EC 81 MG tablet Take 81 mg by mouth daily.    [name] diltiazem (CARDIZEM CD) 120 MG 24 hr capsule Take 1 capsule (120 mg total) by mouth daily. 90 capsule 3  . doxycycline (PERIOSTAT) 20 MG tablet Take 20 mg by mouth daily.    . finasteride (PROSCAR) 5 MG tablet Take 5 mg by mouth 2 (two) times daily.     . fluticasone furoate-vilanterol (BREO ELLIPTA) 100-25 MCG/INH AEPB Inhale 1 puff into the lungs daily. 1 each 0  . furosemide (LASIX) 40 MG tablet Take 1 tablet (40 mg total) by mouth daily. 90 tablet 3  . montelukast (SINGULAIR) 10 MG tablet Take 10 mg by mouth at bedtime.    . Multiple Vitamins-Minerals (ICAPS PO) Take 1 capsule by mouth daily.    [name] omeprazole  (PRILOSEC) 40 MG capsule Take 1 capsule (40 mg total) by mouth 2 (two) times daily. 60 capsule 5  . potassium chloride SA (K-DUR,KLOR-CON) 20 MEQ tablet Take 1 tablet (20 mEq total) by mouth daily. 90 tablet 3  . sodium chloride HYPERTONIC 3 % nebulizer solution Take by nebulization 2 (two) times daily. 300 mL 11  . tamsulosin (FLOMAX) 0.4 MG CAPS capsule Take 0.4 mg by mouth 2 (two) times daily. For urinary symptoms      No current facility-administered medications for this visit.        Physical Exam: BP (!) 100/59 (BP Location: Right Arm, Patient Position: Sitting, Cuff Size: Large)   Pulse 92   Resp 20   Ht 5\' 11"  (1.803 m)   Wt 185 lb 12.8 oz (84.3 kg)   SpO2 96% Comment: RA  BMI 25.91 kg/m   General appearance: alert and cooperative Head: Normocephalic, without obvious abnormality, atraumatic Neck: no adenopathy, no carotid bruit, no JVD, supple, symmetrical, trachea midline and thyroid not enlarged, symmetric, no tenderness/mass/nodules Resp: clear to auscultation bilaterally Cardio: regular rate and rhythm, S1, S2 normal, no murmur, click, rub or gallop GI: soft, non-tender; bowel sounds normal; no masses,  no organomegaly Extremities: extremities normal, atraumatic, no cyanosis or edema and Homans sign is negative, no sign of DVT Neurologic: Grossly normal  Diagnostic Studies & Laboratory data:      Recent Radiology Findings:   Dg Chest 2 View  Result Date: 11/11/2017 CLINICAL DATA:  History of CABG 08/24/2017.  No chest complaints. EXAM: CHEST  2 VIEW COMPARISON:  11/10/2017. FINDINGS: There is small area of scarring in the right lower lobe and lingula. There is no focal consolidation. There is no pleural effusion or pneumothorax. The heart and mediastinal contours are unremarkable. There is evidence of prior CABG. The osseous structures are unremarkable. IMPRESSION: No active cardiopulmonary disease. Electronically Signed   By: Kathreen Devoid   On: 11/11/2017 10:00   Ct  Chest Wo Contrast  Result Date: 11/10/2017 CLINICAL DATA:  Productive cough and shortness of breath for 2 months. COPD. Asbestos exposure. Follow-up pulmonary nodules. EXAM: CT CHEST WITHOUT CONTRAST TECHNIQUE: Multidetector CT imaging of the chest was performed following the standard protocol without IV contrast. COMPARISON:  08/13/2017 and 11/16/1998 and FINDINGS: Cardiovascular: Aortic and coronary artery atherosclerosis. Patient has undergone CABG since prior study. A loculated fluid collection is seen along the posterior left heart border which is new since previous study and measures 8.9 x 3.4 cm. This is consistent with a loculated pericardial effusion, and demonstrates mass effect on the left ventricle. Mediastinum/Nodes: No masses or pathologically enlarged lymph nodes identified on this unenhanced exam. Lungs/Pleura: Mild  emphysema again seen. New atelectasis is seen both lower lungs. No evidence pleural effusion. No evidence of calcified pleural plaque. Right upper lobe ground-glass nodule measuring 11 mm shows no significant change compared to previous study in 2008. A ground-glass nodule is seen in the left upper lobe measuring 12 mm on image 54/3, which is new since recent study 3 months ago, and is consistent with inflammatory etiology given its rapid appearance since the recent exam. No evidence of pulmonary consolidation or pleural effusion. Upper Abdomen:  Unremarkable. Musculoskeletal:  No suspicious bone lesions. IMPRESSION: Interval CABG. Moderate to large loculated pericardial effusion along posterior left heart border, with mass effect on left ventricle. Increased bilateral lower lung atelectasis. Stable right upper lobe ground-glass nodule compared to previous studies dating back to 2008. New 12 mm left upper lobe ground-glass nodule since recent study 3 months ago, most consistent with inflammatory etiology. Recommend follow-up by chest CT without contrast in 6 months to confirm  resolution. Aortic Atherosclerosis (ICD10-I70.0) and Emphysema (ICD10-J43.9). Electronically Signed   By: Earle Gell M.D.   On: 11/10/2017 09:31   Ct Maxillofacial Ltd Wo Cm  Result Date: 11/11/2017 CLINICAL DATA:  Postnasal drip EXAM: CT PARANASAL SINUS LIMITED WITHOUT CONTRAST TECHNIQUE: Non-contiguous multidetector CT images of the paranasal sinuses were obtained in a single plane without contrast. COMPARISON:  None. FINDINGS: Clear paranasal sinuses. Essentially midline nasal septum. No incidental osseous, orbital, or intracranial finding. IMPRESSION: Negative exam.  Clear paranasal sinuses. Electronically Signed   By: Monte Fantasia M.D.   On: 11/11/2017 09:39      Recent Lab Findings: Lab Results  Component Value Date   WBC 10.1 09/28/2017   HGB 9.2 (L) 09/28/2017   HCT 28.6 (L) 09/28/2017   PLT 324.0 09/28/2017   GLUCOSE 102 (H) 10/11/2017   ALT 18 08/20/2017   AST 13 (L) 08/20/2017   NA 142 10/11/2017   K 4.7 10/11/2017   CL 104 10/11/2017   CREATININE 1.27 10/11/2017   BUN 18 10/11/2017   CO2 25 10/11/2017   TSH 2.942 08/31/2017   INR 1.39 08/24/2017   HGBA1C 6.4 (H) 08/20/2017   Transthoracic Echocardiography  Patient:    Tim [name], Tim [name] MR #:       [id number] Study Date: 11/11/2017 Gender:     M Age:        56 Height:     180.3 cm Weight:     83.5 kg BSA:        2.05 m^2 Pt. Status: Room:   ATTENDING    Jenkins Rouge, M.D.  ORDERING     Lanelle Bal MD  REFERRING    Lanelle Bal MD  SONOGRAPHER  Marygrace Drought, RCS  PERFORMING   [facility], Outpatient  cc:  ------------------------------------------------------------------- LV EF: 60% -   65%  ------------------------------------------------------------------- Indications:      Cardiac Tamponade (I31.4).  ------------------------------------------------------------------- History:   PMH:  Hyperlipidemia, AAA.  PMH:   Myocardial infarction.  Risk factors:   Hypertension.  ------------------------------------------------------------------- Study Conclusions  - Left ventricle: The cavity size was normal. Wall thickness was   normal. Systolic function was normal. The estimated ejection   fraction was in the range of 60% to 65%. Doppler parameters are   consistent with both elevated ventricular end-diastolic filling   pressure and elevated left atrial filling pressure. - Left atrium: The atrium was mildly dilated. - Atrial septum: No defect or patent foramen ovale was identified. - Pericardium, extracardiac: Moderate localized posterior lateral   pericardial effusion  no tamponade.  ------------------------------------------------------------------- Labs, prior tests, procedures, and surgery: Coronary artery bypass grafting.  ------------------------------------------------------------------- Study data:  Comparison was made to the study of 08/20/2017.  Study status:  Routine.  Procedure:  The patient reported no pain pre or post test. Transthoracic echocardiography. Image quality was adequate.          Transthoracic echocardiography.  M-mode, complete 2D, spectral Doppler, and color Doppler.  Birthdate: Patient birthdate: Mar 26, 1929.  Age:  Patient is 84 yr old.  Sex: Gender: male.    BMI: 25.7 kg/m^2.  Blood pressure:     100/57 Patient status:  Outpatient.  Study date:  Study date: 11/11/2017. Study time: 01:12 PM.  Location:  [facility]  -------------------------------------------------------------------  ------------------------------------------------------------------- Left ventricle:  The cavity size was normal. Wall thickness was normal. Systolic function was normal. The estimated ejection fraction was in the range of 60% to 65%. Doppler parameters are consistent with both elevated ventricular end-diastolic filling pressure and elevated left atrial filling  pressure.  ------------------------------------------------------------------- Aortic valve:   Trileaflet; moderately thickened, moderately calcified leaflets. Sclerosis without stenosis.  ------------------------------------------------------------------- Aorta:  The aorta was normal, not dilated, and non-diseased.  ------------------------------------------------------------------- Mitral valve:   Doppler:  There was no significant regurgitation.  Peak gradient (D): 6 mm Hg.  ------------------------------------------------------------------- Left atrium:  The atrium was mildly dilated.  ------------------------------------------------------------------- Atrial septum:  No defect or patent foramen ovale was identified.   ------------------------------------------------------------------- Right ventricle:  The cavity size was normal. Wall thickness was normal. Systolic function was normal.  ------------------------------------------------------------------- Pulmonic valve:    Doppler:  There was mild regurgitation.  ------------------------------------------------------------------- Tricuspid valve:   Doppler:  There was mild regurgitation.  ------------------------------------------------------------------- Right atrium:  The atrium was normal in size.  ------------------------------------------------------------------- Pericardium:  Moderate localized posterior lateral pericardial effusion no tamponade.  ------------------------------------------------------------------- Systemic veins: Inferior vena cava: The vessel was normal in size. The respirophasic diameter changes were in the normal range (= 50%), consistent with normal central venous pressure. Diameter: 18 mm.  ------------------------------------------------------------------- Post procedure conclusions Ascending Aorta:  - The aorta was normal, not dilated, and  non-diseased.  ------------------------------------------------------------------- Measurements   IVC                                        Value        Reference  ID                                         18    mm     ----------    Left ventricle                             Value        Reference  LV ID, ED, PLAX chordal            (L)     38.7  mm     43 - 52  LV ID, ES, PLAX chordal                    29.8  mm     23 - 38  LV fx shortening, PLAX chordal     (L)     23    %      >=  29  LV PW thickness, ED                        14.7  mm     ----------  IVS/LV PW ratio, ED                        0.76         <=1.3  Stroke volume, 2D                          88    ml     ----------  Stroke volume/bsa, 2D                      43    ml/m^2 ----------  LV e&', lateral                             6.64  cm/s   ----------  LV E/e&', lateral                           18.52        ----------  LV e&', medial                              7.83  cm/s   ----------  LV E/e&', medial                            15.71        ----------  LV e&', average                             7.24  cm/s   ----------  LV E/e&', average                           17           ----------    Ventricular septum                         Value        Reference  IVS thickness, ED                          11.2  mm     ----------    LVOT                                       Value        Reference  LVOT ID, S                                 21    mm     ----------  LVOT area                                  3.46  cm^2   ----------  LVOT peak velocity, S  116   cm/s   ----------  LVOT mean velocity, S                      81.9  cm/s   ----------  LVOT VTI, S                                25.5  cm     ----------  LVOT peak gradient, S                      5     mm Hg  ----------    Aorta                                      Value        Reference  Aortic root ID, ED                         35    mm      ----------    Left atrium                                Value        Reference  LA ID, A-P, ES                             40    mm     ----------  LA ID/bsa, A-P                             1.95  cm/m^2 <=2.2  LA volume, S                               46.7  ml     ----------  LA volume/bsa, S                           22.7  ml/m^2 ----------  LA volume, ES, 1-p A4C                     32.2  ml     ----------  LA volume/bsa, ES, 1-p A4C                 15.7  ml/m^2 ----------  LA volume, ES, 1-p A2C                     65.9  ml     ----------  LA volume/bsa, ES, 1-p A2C                 32.1  ml/m^2 ----------    Mitral valve                               Value        Reference  Mitral E-wave peak velocity                123   cm/s   ----------  Mitral  A-wave peak velocity                88.3  cm/s   ----------  Mitral deceleration time                   197   ms     150 - 230  Mitral peak gradient, D                    6     mm Hg  ----------  Mitral E/A ratio, peak                     1.4          ----------    Pulmonary arteries                         Value        Reference  PA pressure, S, DP                         18    mm Hg  <=30    Tricuspid valve                            Value        Reference  Tricuspid regurg peak velocity             193   cm/s   ----------  Tricuspid peak RV-RA gradient              15    mm Hg  ----------  Tricuspid maximal regurg velocity,         193   cm/s   ----------  PISA    Right atrium                               Value        Reference  RA ID, S-I, ES, A4C                (H)     59    mm     34 - 49  RA area, ES, A4C                           17.4  cm^2   8.3 - 19.5  RA volume, ES, A/L                         41.8  ml     ----------  RA volume/bsa, ES, A/L                     20.3  ml/m^2 ----------    Systemic veins                             Value        Reference  Estimated CVP                              3     mm Hg  ----------     Right ventricle  Value        Reference  TAPSE                                      14.7  mm     ----------  RV pressure, S, DP                         18    mm Hg  <=30  RV s&', lateral, S                          8.81  cm/s   ----------  Legend: (L)  and  (H)  mark values outside specified reference range.  ------------------------------------------------------------------- Prepared and Electronically Authenticated by  Jenkins Rouge, M.D. 2019-02-28T16:25:03   Assessment / Plan:   Patient progressing well following coronary artery bypass grafting. Plan to see back in 6 weeks with a repeat CT scan to evaluate the degree of pericardial effusion noted on echocardiogram Symptomatically the patient continues to improve.    Grace Isaac MD      [address] [place] [address] [place]   B[facility] [phone]  11/25/2017 4:02 PM

## 2017-11-26 ENCOUNTER — Other Ambulatory Visit (HOSPITAL_COMMUNITY): Payer: Medicare Other

## 2017-11-26 ENCOUNTER — Ambulatory Visit: Payer: Medicare Other

## 2017-12-02 ENCOUNTER — Other Ambulatory Visit: Payer: Self-pay | Admitting: *Deleted

## 2017-12-02 DIAGNOSIS — I3139 Other pericardial effusion (noninflammatory): Secondary | ICD-10-CM

## 2017-12-02 DIAGNOSIS — I313 Pericardial effusion (noninflammatory): Secondary | ICD-10-CM

## 2017-12-08 LAB — FUNGUS CULTURE W SMEAR
MICRO NUMBER:: 90256576
SMEAR:: NONE SEEN
SPECIMEN QUALITY:: ADEQUATE

## 2017-12-14 ENCOUNTER — Ambulatory Visit: Payer: Medicare Other | Admitting: Cardiothoracic Surgery

## 2017-12-27 LAB — MYCOBACTERIA,CULT W/FLUOROCHROME SMEAR
MICRO NUMBER:: 90256575
SMEAR:: NONE SEEN
SPECIMEN QUALITY:: ADEQUATE

## 2017-12-29 ENCOUNTER — Ambulatory Visit: Payer: Medicare Other | Admitting: Pulmonary Disease

## 2017-12-29 ENCOUNTER — Ambulatory Visit (INDEPENDENT_AMBULATORY_CARE_PROVIDER_SITE_OTHER): Payer: Medicare Other | Admitting: Pharmacist

## 2017-12-29 ENCOUNTER — Encounter: Payer: Self-pay | Admitting: Pulmonary Disease

## 2017-12-29 VITALS — BP 120/64 | HR 83 | Ht 71.0 in | Wt 183.4 lb

## 2017-12-29 DIAGNOSIS — J411 Mucopurulent chronic bronchitis: Secondary | ICD-10-CM | POA: Diagnosis not present

## 2017-12-29 DIAGNOSIS — J301 Allergic rhinitis due to pollen: Secondary | ICD-10-CM

## 2017-12-29 DIAGNOSIS — E7800 Pure hypercholesterolemia, unspecified: Secondary | ICD-10-CM

## 2017-12-29 DIAGNOSIS — J4550 Severe persistent asthma, uncomplicated: Secondary | ICD-10-CM | POA: Diagnosis not present

## 2017-12-29 DIAGNOSIS — R911 Solitary pulmonary nodule: Secondary | ICD-10-CM

## 2017-12-29 DIAGNOSIS — J432 Centrilobular emphysema: Secondary | ICD-10-CM

## 2017-12-29 DIAGNOSIS — R0982 Postnasal drip: Secondary | ICD-10-CM | POA: Diagnosis not present

## 2017-12-29 DIAGNOSIS — K219 Gastro-esophageal reflux disease without esophagitis: Secondary | ICD-10-CM

## 2017-12-29 NOTE — Progress Notes (Signed)
Subjective:    Patient ID: Tim [name], male    DOB: [birth year], 84 y.o.   MRN: [id number]  Synopsis: Former patient of Dr. Gwenette [name] with COPD and a paralyzed R Hemidiaphragm as seen on fluoroscopy in 2007. Lung function testing in 2005 showed clear airflow obstruction, FEV1 of 1.53 L (43% predicted), total lung capacity 62% predicted, DLCO 73% predicted. He smoked 2-3 packs per day for 40 years, quit around 1980.  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    HPI Chief Complaint  Patient presents with  . Follow-up    6 week, reports he has been great.    He has been doing well since the last visit.  He still has some shortness of breath and some cough with clear mucus production but this has been improving.  He has been increasing his activity gradually over the last 2 months.  He has not had bronchitis or a flare of his asthma which has required antibiotics or prednisone.  In general he says that he is doing fairly well.  He does note some pain along his surgical scar in the skin.  Past Medical History:  Diagnosis Date  . AAA (abdominal aortic aneurysm) (HCC)    3.2 cm 07/2016; 2-3 year f/u recommended  . Asthma   . BPH (benign prostatic hypertrophy)   . CAD (coronary artery disease)    9 STEMI 2001, PCI, RCA, residual 100% circumflex  /   PCI for in-stent restenosis June, 2001  /  nuclear February, 2010, no ischemia  . Cancer ([place])    skin cancer removed  . Carotid artery disease ([place])    Doppler, June, 2011, 0-39% bilateral, pt denies  . Cervical disc disease    Status post neurosurgery  . Colon polyps   . Ejection fraction    EF 55-65%, echo, 2009  . Elevated CPK    Chronic mild CPK elevation  . Emphysema    Mild  . Fatigue    Morning fatigue, August, 2011  . GERD (gastroesophageal reflux disease)   . Headache    back of head  . Hyperlipidemia   . Hypertension   . Low back pain    February, 2013  . Myocardial infarction ([place])   .  Paralyzed hemidiaphragm    Right  . Paralyzed hemidiaphragm    Chronic  . Statin intolerance    Elevated CPK in the past      Review of Systems  Constitutional: Negative for fatigue and fever.  HENT: Negative for postnasal drip and rhinorrhea.   Respiratory: Positive for cough. Negative for shortness of breath and wheezing.   Cardiovascular: Negative for chest pain and leg swelling.       Objective:   Physical Exam Vitals:   12/29/17 0900  BP: 120/64  Pulse: 83  SpO2: 98%  Weight: 183 lb 6.4 oz (83.2 kg)  Height: 5\' 11"  (1.803 m)   RA  Gen: well appearing HENT: OP clear, TM's clear, neck supple PULM: Some wheezing B, normal percussion CV: RRR, no mgr, trace edema GI: BS+, soft, nontender Derm: no cyanosis or rash Psyche: normal mood and affect    Chest imaging 01/2016 CXR > granuloma left upper lobe November 2018 CT chest: Less than 4 mm solid pleural-based right upper lobe nodule noted, groundglass nodule 11.9 mm right upper lobe, centrilobular emphysema noted, nonspecific scarring in the bases.  Images independently reviewed by me  Labs: CBC  Component Value Date/Time   WBC 10.1 09/28/2017 1239   RBC 3.32 (L) 09/28/2017 1239   HGB 9.2 (L) 09/28/2017 1239   HGB 13.0 07/27/2017 1020   HCT 28.6 (L) 09/28/2017 1239   HCT 38.3 07/27/2017 1020   PLT 324.0 09/28/2017 1239   PLT 269 07/27/2017 1020   MCV 86.3 09/28/2017 1239   MCV 87 07/27/2017 1020   MCH 28.6 08/31/2017 0429   MCHC 32.0 09/28/2017 1239   RDW 16.1 (H) 09/28/2017 1239   RDW 14.7 07/27/2017 1020   LYMPHSABS 1.3 09/28/2017 1239   MONOABS 0.6 09/28/2017 1239   EOSABS 0.2 09/28/2017 1239   BASOSABS 0.0 09/28/2017 1239   Labs: 08/2017 IgE normal January 2019 IgE normal  Exhaled nitric oxide test: January 2018 22 ppm  Chest imaging: November 10, 2017 CT chest images independently reviewed showing mild to moderate centrilobular emphysema, atelectasis in the bases, left upper lobe nodule  12 mm in size new from prior, stable right lung groundglass nodule is dating back to 2008, moderate to large loculated pericardial effusion noted  Records from his two visits with [facility] reviewed where they are watching his pericardial effusion with repeat imaging.      Assessment & Plan:   Solitary pulmonary nodule  Postnasal drip  Allergic rhinitis due to pollen, unspecified seasonality  Mucopurulent chronic bronchitis (HCC)  Severe persistent asthma without complication  Centrilobular emphysema (HCC)  Gastroesophageal reflux disease, esophagitis presence not specified  Discussion: In general this is been a stable interval for Tim [name].  He still has severe symptoms from his asthma COPD overlap syndrome but in general things have improved.  I am still considering whether or not he would benefit from a biologic agent but considering his mild improvement I would prefer to hold off at this time as those agents are incredibly expensive.  If he has another flareup of bronchitis or asthma within the next 4 months then we will need to start something like Tim [name].  Plan: Allergic rhinitis: Continue Singulair Continue saline rinses Continue Nasacort Continue Zyrtec  Gastroesophageal reflux disease: Continue Prilosec twice a day  Pulmonary nodules: You will have another CT scan of your chest in August, I will see you after that  Pericardial effusion: Continue follow-up with Dr. Servando [name]  COPD asthma overlap syndrome: Keep taking Breo daily Keep taking Singulair Keep using hypertonic saline before bedtime Keep using albuterol as needed for chest tightness wheezing or shortness of breath If you have any new or worsening chest symptoms like bronchitis come back and see me prior to the next visit  I will see you back in August or sooner if needed  p Current Outpatient Medications:  .  albuterol (PROVENTIL HFA;VENTOLIN HFA) 108 (90 BASE) MCG/ACT inhaler, Inhale 2  puffs into the lungs every 6 (six) hours as needed for wheezing or shortness of breath., Disp: 1 Inhaler, Rfl: 6 .  albuterol (PROVENTIL) (2.5 MG/3ML) 0.083% nebulizer solution, Take 3 mLs (2.5 mg total) by nebulization every 6 (six) hours as needed for wheezing or shortness of breath., Disp: 75 mL, Rfl: 12 .  aspirin EC 81 MG tablet, Take 81 mg by mouth daily., Disp: , Rfl:  .  diltiazem (CARDIZEM CD) 120 MG 24 hr capsule, Take 1 capsule (120 mg total) by mouth daily., Disp: 90 capsule, Rfl: 3 .  doxycycline (PERIOSTAT) 20 MG tablet, Take 20 mg by mouth daily., Disp: , Rfl:  .  finasteride (PROSCAR) 5 MG tablet, Take 5 mg by  mouth 2 (two) times daily. , Disp: , Rfl:  .  fluticasone furoate-vilanterol (BREO ELLIPTA) 100-25 MCG/INH AEPB, Inhale 1 puff into the lungs daily., Disp: 1 each, Rfl: 0 .  furosemide (LASIX) 40 MG tablet, Take 1 tablet (40 mg total) by mouth daily., Disp: 90 tablet, Rfl: 3 .  montelukast (SINGULAIR) 10 MG tablet, Take 10 mg by mouth at bedtime., Disp: , Rfl:  .  Multiple Vitamins-Minerals (ICAPS PO), Take 1 capsule by mouth daily., Disp: , Rfl:  .  omeprazole (PRILOSEC) 40 MG capsule, Take 1 capsule (40 mg total) by mouth 2 (two) times daily., Disp: 60 capsule, Rfl: 5 .  potassium chloride SA (K-DUR,KLOR-CON) 20 MEQ tablet, Take 1 tablet (20 mEq total) by mouth daily., Disp: 90 tablet, Rfl: 3 .  sodium chloride HYPERTONIC 3 % nebulizer solution, Take by nebulization 2 (two) times daily., Disp: 300 mL, Rfl: 11 .  tamsulosin (FLOMAX) 0.4 MG CAPS capsule, Take 0.4 mg by mouth 2 (two) times daily. For urinary symptoms , Disp: , Rfl:

## 2017-12-29 NOTE — Progress Notes (Signed)
Patient ID: Tim [name]                 DOB: [birth year]                    MRN: [id number]     HPI: Tim [name] is a 84 y.o. male patient of Dr. Marlou Porch that presents today for lipid evaluation.  PMH includes bilateral carotid artery disease,, coronary artery disease status post STEMI in 2001 with PCI to RCA, residual 100% circumflex, subsequent PCI for in-stent restenosis in June 2001, nuclear in 2014 with no scar, no ischemia 62%. Now s/p CABGx4 in Dec 2018. He has had trouble with statins in the past, elevated CPK in the past.  He presents for discussion of cholesterol with his daughter [name], whom he states is his 'ears.' He reports that he tried statin medications when he was seeing Dr. Ron Parker and he was unable to tolerate them due to severe muscle aching. He states he does not recall doses, but does remember taking Lipitor, Crestor and Zocor. He states he also believes he took pravastatin as he tried at least 4 different statin medications. He reports that the aching did improve when he stopped the medications.   Risk Factors: CAD s/p STEMI with PCI in 2001 and CABG in 2018 LDL Goal: <70  Current Medications: none Intolerances: Lipitor, Crestor, pravastatin?, Zocor (muscle aces in legs an lower back, per Dr. Ron Parker pt also experienced CPK elevations on statins)  Diet: He eats from home and out and he states he will eat just about anything put in front of him. He occasionally uses. He eats meats fried grilled and baked. He admits he could be better with vegetables. He drinks mostly water or tea. He drinks coffee 3-4 cups per day with cream and sugar.   Exercise: He exercises every day at bedtime. He does leg exercises and rides bike in bed.   Social History: former smoker - quit in [place]: faxed from PCP - Dr. Jodene Nam office 07/21/17:  TC 201, TG 140, HDL 37, LDL 136 (no therapy)   Past Medical History:  Diagnosis Date  . AAA (abdominal aortic aneurysm) (HCC)    3.2 cm  07/2016; 2-3 year f/u recommended  . Asthma   . BPH (benign prostatic hypertrophy)   . CAD (coronary artery disease)    9 STEMI 2001, PCI, RCA, residual 100% circumflex  /   PCI for in-stent restenosis June, 2001  /  nuclear February, 2010, no ischemia  . Cancer ([place])    skin cancer removed  . Carotid artery disease ([place])    Doppler, June, 2011, 0-39% bilateral, pt denies  . Cervical disc disease    Status post neurosurgery  . Colon polyps   . Ejection fraction    EF 55-65%, echo, 2009  . Elevated CPK    Chronic mild CPK elevation  . Emphysema    Mild  . Fatigue    Morning fatigue, August, 2011  . GERD (gastroesophageal reflux disease)   . Headache    back of head  . Hyperlipidemia   . Hypertension   . Low back pain    February, 2013  . Myocardial infarction ([place])   . Paralyzed hemidiaphragm    Right  . Paralyzed hemidiaphragm    Chronic  . Statin intolerance    Elevated CPK in the past    Current Outpatient Medications on File Prior to Visit  Medication Sig Dispense Refill  .  albuterol (PROVENTIL HFA;VENTOLIN HFA) 108 (90 BASE) MCG/ACT inhaler Inhale 2 puffs into the lungs every 6 (six) hours as needed for wheezing or shortness of breath. 1 Inhaler 6  . albuterol (PROVENTIL) (2.5 MG/3ML) 0.083% nebulizer solution Take 3 mLs (2.5 mg total) by nebulization every 6 (six) hours as needed for wheezing or shortness of breath. 75 mL 12  . aspirin EC 81 MG tablet Take 81 mg by mouth daily.    [name] diltiazem (CARDIZEM CD) 120 MG 24 hr capsule Take 1 capsule (120 mg total) by mouth daily. 90 capsule 3  . doxycycline (PERIOSTAT) 20 MG tablet Take 20 mg by mouth daily.    . finasteride (PROSCAR) 5 MG tablet Take 5 mg by mouth 2 (two) times daily.     . fluticasone furoate-vilanterol (BREO ELLIPTA) 100-25 MCG/INH AEPB Inhale 1 puff into the lungs daily. 1 each 0  . furosemide (LASIX) 40 MG tablet Take 1 tablet (40 mg total) by mouth daily. 90 tablet 3  . montelukast (SINGULAIR) 10 MG  tablet Take 10 mg by mouth at bedtime.    . Multiple Vitamins-Minerals (ICAPS PO) Take 1 capsule by mouth daily.    [name] omeprazole (PRILOSEC) 40 MG capsule Take 1 capsule (40 mg total) by mouth 2 (two) times daily. 60 capsule 5  . potassium chloride SA (K-DUR,KLOR-CON) 20 MEQ tablet Take 1 tablet (20 mEq total) by mouth daily. 90 tablet 3  . sodium chloride HYPERTONIC 3 % nebulizer solution Take by nebulization 2 (two) times daily. 300 mL 11  . tamsulosin (FLOMAX) 0.4 MG CAPS capsule Take 0.4 mg by mouth 2 (two) times daily. For urinary symptoms      No current facility-administered medications on file prior to visit.     Allergies  Allergen Reactions  . Statins Other (See Comments)    CLASS EFFECT SEVERE LEG CRAMPS [MULTIPLE STATINS]    Assessment/Plan: Hyperlipidemia: Lipid results faxed from Primary MD. LDL is not at goal less than 70. Patient has been intolerant to statins due to CPK elevations and muscle pains. Discussed options including Zetia and PCSK9i therapy. Given pt LDL and no evidence for Zetia monotherapy will pursue PCSK9i therapy with Repatha 140mg  Q14D. Risk/Benefit, Injection technique, and financial obligations were discussed. Will contact patient and daughter once approved.    Thank you,  Lelan Pons. Patterson Hammersmith, [facility]  12/29/2017 9:19 AM

## 2017-12-29 NOTE — Addendum Note (Signed)
Addended by: Len Blalock on: 12/29/2017 09:22 AM   Modules accepted: Orders

## 2017-12-29 NOTE — Patient Instructions (Addendum)
Depending on your cholesterol results we will send for coverage of Repatha through your insurance. We will call [name] with the results and what option we will pursue to control your cholesterol.  [phone] - if you have any questions or concerns.    Cholesterol Cholesterol is a fat. Your body needs a small amount of cholesterol. Cholesterol (plaque) may build up in your blood vessels (arteries). That makes you more likely to have a heart attack or stroke. You cannot feel your cholesterol level. Having a blood test is the only way to find out if your level is high. Keep your test results. Work with your doctor to keep your cholesterol at a good level. What do the results mean?  Total cholesterol is how much cholesterol is in your blood.  LDL is bad cholesterol. This is the type that can build up. Try to have low LDL.  HDL is good cholesterol. It cleans your blood vessels and carries LDL away. Try to have high HDL.  Triglycerides are fat that the body can store or burn for energy. What are good levels of cholesterol?  Total cholesterol below 200.  LDL below 100 is good for people who have health risks. LDL below 70 is good for people who have very high risks.  HDL above 40 is good. It is best to have HDL of 60 or higher.  Triglycerides below 150. How can I lower my cholesterol? Diet Follow your diet program as told by your doctor.  Choose fish, white meat chicken, or [place] that is roasted or baked. Try not to eat red meat, fried foods, sausage, or lunch meats.  Eat lots of fresh fruits and vegetables.  Choose whole grains, beans, pasta, potatoes, and cereals.  Choose olive oil, corn oil, or canola oil. Only use small amounts.  Try not to eat butter, mayonnaise, shortening, or palm kernel oils.  Try not to eat foods with trans fats.  Choose low-fat or nonfat dairy foods. ? Drink skim or nonfat milk. ? Eat low-fat or nonfat yogurt and cheeses. ? Try not to drink whole  milk or cream. ? Try not to eat ice cream, egg yolks, or full-fat cheeses.  Healthy desserts include angel food cake, ginger snaps, animal crackers, hard candy, popsicles, and low-fat or nonfat frozen yogurt. Try not to eat pastries, cakes, pies, and cookies.  Exercise Follow your exercise program as told by your doctor.  Be more active. Try gardening, walking, and taking the stairs.  Ask your doctor about ways that you can be more active.  Medicine  Take over-the-counter and prescription medicines only as told by your doctor. This information is not intended to replace advice given to you by your health care provider. Make sure you discuss any questions you have with your health care provider. Document Released: 11/27/2008 Document Revised: 04/01/2016 Document Reviewed: 03/12/2016 Elsevier Interactive Patient Education  [employer].

## 2017-12-29 NOTE — Patient Instructions (Signed)
Allergic rhinitis: Continue Singulair Continue saline rinses Continue Nasacort Continue Zyrtec  Gastroesophageal reflux disease: Continue Prilosec twice a day  Pulmonary nodules: You will have another CT scan of your chest in August, I will see you after that  Pericardial effusion: Continue follow-up with Dr. Servando Snare  COPD asthma overlap syndrome: Keep taking Breo daily Keep taking Singulair Keep using hypertonic saline before bedtime Keep using albuterol as needed for chest tightness wheezing or shortness of breath If you have any new or worsening chest symptoms like bronchitis come back and see me prior to the next visit  I will see you back in August or sooner if needed

## 2017-12-30 ENCOUNTER — Encounter: Payer: Self-pay | Admitting: Pharmacist

## 2018-01-19 ENCOUNTER — Telehealth: Payer: Self-pay | Admitting: Pharmacist

## 2018-01-19 MED ORDER — EVOLOCUMAB 140 MG/ML ~~LOC~~ SOAJ: 1.0000 "pen " | SUBCUTANEOUS | 11 refills | Status: DC

## 2018-01-19 NOTE — Telephone Encounter (Signed)
Prior authorization for Repatha has been approved. Rx sent to [facility] per insurance requirement. Pt aware to contact clinic with any issues in filling medication. Will need f/u labs scheduled after 3-4 injections.

## 2018-01-20 ENCOUNTER — Ambulatory Visit
Admission: RE | Admit: 2018-01-20 | Discharge: 2018-01-20 | Disposition: A | Discharge status: Home / Self Care | Payer: Medicare Other | Source: Ambulatory Visit | Attending: Cardiothoracic Surgery | Admitting: Cardiothoracic Surgery

## 2018-01-20 ENCOUNTER — Ambulatory Visit: Payer: Medicare Other | Admitting: Cardiothoracic Surgery

## 2018-01-20 ENCOUNTER — Encounter: Payer: Self-pay | Admitting: Cardiothoracic Surgery

## 2018-01-20 ENCOUNTER — Other Ambulatory Visit: Payer: Self-pay

## 2018-01-20 VITALS — BP 107/64 | HR 83 | Resp 16 | Ht 71.0 in | Wt 184.6 lb

## 2018-01-20 DIAGNOSIS — R918 Other nonspecific abnormal finding of lung field: Secondary | ICD-10-CM

## 2018-01-20 DIAGNOSIS — Z951 Presence of aortocoronary bypass graft: Secondary | ICD-10-CM

## 2018-01-20 DIAGNOSIS — I3139 Other pericardial effusion (noninflammatory): Secondary | ICD-10-CM

## 2018-01-20 DIAGNOSIS — I313 Pericardial effusion (noninflammatory): Secondary | ICD-10-CM

## 2018-01-20 DIAGNOSIS — I2510 Atherosclerotic heart disease of native coronary artery without angina pectoris: Secondary | ICD-10-CM | POA: Diagnosis not present

## 2018-01-20 NOTE — Progress Notes (Signed)
[address]       [place] [address]             [phone]      Sirron L Patteson [facility] Medical Record #[id number] Date of Birth: [birth year]  Referring: Nelva Bush, MD Primary Care: Raelene Bott, MD Primary Cardiologist: Candee Furbish, MD   Chief Complaint:   POST OP FOLLOW UP 08/24/2017 OPERATIVE REPORT PREOPERATIVE DIAGNOSIS:  Coronary occlusive disease with angina. POSTOPERATIVE DIAGNOSIS:  Coronary occlusive disease with angina. SURGICAL PROCEDURES:  Coronary artery bypass grafting x4 with the left internal mammary to the left anterior descending coronary artery, reversed saphenous vein graft to the diagonal coronary artery, reversed saphenous vein graft to the distal circumflex coronary artery, reversed saphenous vein graft to the posterior descending coronary artery with bilateral right and left greater saphenous endoscopic vein harvesting. SURGEON:  Lanelle Bal, MD.   History of Present Illness:     Patient returns to the office today in follow-up after coronary artery bypass grafting done 5 months ago.  He had severe underlying pulmonary disease prior to surgery, in spite of this he continues to make reasonable recovery.  He notes that he is back to near normal activities.  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  He is back cutting the grass on a limited basis with a riding more.  He denies any overt symptoms of congestive heart failure or recurrent angina.   Postop CT scan of the chest done by the pulmonary service suggested a loculated posterior pericardial effusion.  He returns today for follow-up CT scan    Past Medical History:  Diagnosis Date  . AAA (abdominal aortic aneurysm) (HCC)    3.2 cm 07/2016; 2-3 year f/u recommended  . Asthma   . BPH (benign prostatic hypertrophy)   . CAD (coronary artery disease)    9 STEMI 2001, PCI, RCA, residual 100% circumflex  /   PCI for in-stent  restenosis June, 2001  /  nuclear February, 2010, no ischemia  . Cancer ([facility])    skin cancer removed  . Carotid artery disease ([facility])    Doppler, June, 2011, 0-39% bilateral, pt denies  . Cervical disc disease    Status post neurosurgery  . Colon polyps   . Ejection fraction    EF 55-65%, echo, 2009  . Elevated CPK    Chronic mild CPK elevation  . Emphysema    Mild  . Fatigue    Morning fatigue, August, 2011  . GERD (gastroesophageal reflux disease)   . Headache    back of head  . Hyperlipidemia   . Hypertension   . Low back pain    February, 2013  . Myocardial infarction ([facility])   . Paralyzed hemidiaphragm    Right  . Paralyzed hemidiaphragm    Chronic  . Statin intolerance    Elevated CPK in the past     Social History   Tobacco Use  Smoking Status Former Smoker  . Packs/day: 3.00  . Years: 40.00  . Pack years: 120.00  . Types: Cigarettes  . Last attempt to quit: 09/15/1975  . Years since quitting: 42.3  Smokeless Tobacco Never Used    Social History   Substance and Sexual Activity  Alcohol Use No  . Frequency: Never     Allergies  Allergen Reactions  . Statins Other (See Comments)    CLASS EFFECT SEVERE LEG CRAMPS [MULTIPLE STATINS]  Current Outpatient Medications  Medication Sig Dispense Refill  . albuterol (PROVENTIL HFA;VENTOLIN HFA) 108 (90 BASE) MCG/ACT inhaler Inhale 2 puffs into the lungs every 6 (six) hours as needed for wheezing or shortness of breath. 1 Inhaler 6  . albuterol (PROVENTIL) (2.5 MG/3ML) 0.083% nebulizer solution Take 3 mLs (2.5 mg total) by nebulization every 6 (six) hours as needed for wheezing or shortness of breath. 75 mL 12  . aspirin EC 81 MG tablet Take 81 mg by mouth daily.    [name] diltiazem (CARDIZEM CD) 120 MG 24 hr capsule Take 1 capsule (120 mg total) by mouth daily. 90 capsule 3  . doxycycline (PERIOSTAT) 20 MG tablet Take 20 mg by mouth daily.    . Evolocumab (REPATHA SURECLICK) 101 MG/ML SOAJ Inject 1 pen into the  skin every 14 (fourteen) days. 2 pen 11  . finasteride (PROSCAR) 5 MG tablet Take 5 mg by mouth 2 (two) times daily.     . fluticasone furoate-vilanterol (BREO ELLIPTA) 100-25 MCG/INH AEPB Inhale 1 puff into the lungs daily. 1 each 0  . furosemide (LASIX) 40 MG tablet Take 1 tablet (40 mg total) by mouth daily. 90 tablet 3  . montelukast (SINGULAIR) 10 MG tablet Take 10 mg by mouth at bedtime.    . Multiple Vitamins-Minerals (ICAPS PO) Take 1 capsule by mouth daily.    [name] omeprazole (PRILOSEC) 40 MG capsule Take 1 capsule (40 mg total) by mouth 2 (two) times daily. 60 capsule 5  . potassium chloride SA (K-DUR,KLOR-CON) 20 MEQ tablet Take 1 tablet (20 mEq total) by mouth daily. 90 tablet 3  . sodium chloride HYPERTONIC 3 % nebulizer solution Take by nebulization 2 (two) times daily. 300 mL 11  . tamsulosin (FLOMAX) 0.4 MG CAPS capsule Take 0.4 mg by mouth 2 (two) times daily. For urinary symptoms      No current facility-administered medications for this visit.        Physical Exam: BP 107/64 (BP Location: Right Arm, Patient Position: Sitting, Cuff Size: Normal)   Pulse 83   Resp 16   Ht 5\' 11"  (1.803 m)   Wt 184 lb 9.6 oz (83.7 kg)   SpO2 95% Comment: ON RA  BMI 25.75 kg/m  General appearance: alert, cooperative and cachectic Head: Normocephalic, without obvious abnormality, atraumatic Neck: no adenopathy, no carotid bruit, no JVD, supple, symmetrical, trachea midline and thyroid not enlarged, symmetric, no tenderness/mass/nodules Lymph nodes: Cervical, supraclavicular, and axillary nodes normal. Resp: clear to auscultation bilaterally Back: symmetric, no curvature. ROM normal. No CVA tenderness. Cardio: regular rate and rhythm, S1, S2 normal, no murmur, click, rub or gallop GI: soft, non-tender; bowel sounds normal; no masses,  no organomegaly Extremities: extremities normal, atraumatic, no cyanosis or edema and Homans sign is negative, no sign of DVT Neurologic: Grossly  normal  Diagnostic Studies & Laboratory data:      Recent Radiology Findings:  Ct Chest Wo Contrast  Result Date: 01/20/2018 CLINICAL DATA:  Follow-up pericardial effusion. CABG 08/24/2017. No acute symptoms reported. EXAM: CT CHEST WITHOUT CONTRAST TECHNIQUE: Multidetector CT imaging of the chest was performed following the standard protocol without IV contrast. COMPARISON:  11/10/2017 chest CT.  11/11/2017 chest radiograph. FINDINGS: Cardiovascular: Top-normal heart size. Persistent thick-walled loculated left posterior pericardial fluid collection measuring 8.7 x 3.3 cm (series 2/image 98), with stable mass-effect on left ventricle. Stable smaller loculated thick-walled pericardial fluid collection in the superior left pericardium measuring 1.8 x 1.3 cm (series 2/image 75). Left main and 3 vessel  coronary atherosclerosis status post CABG. Atherosclerotic nonaneurysmal thoracic aorta. Normal caliber pulmonary arteries. Mediastinum/Nodes: No discrete thyroid nodules. Unremarkable esophagus. No pathologically enlarged axillary, mediastinal or hilar lymph nodes, noting limited sensitivity for the detection of hilar adenopathy on this noncontrast study. Lungs/Pleura: No pneumothorax. No pleural effusion. Moderate centrilobular and paraseptal emphysema with diffuse bronchial wall thickening. Basilar right upper lobe 1.4 cm ground-glass pulmonary nodule (series 8/image 60), previously 1.4 cm on 11/10/2017 chest CT and 1.1 cm on 11/16/2006 chest CT. Left upper lobe 1.3 cm ground-glass pulmonary nodule (series 8/image 50), stable in size since 11/10/2017 chest CT, slightly decreased in density. New clustered solid nodularity in the posterior left lower lobe up to 8 mm (series 8/image 38), which appear centrilobular in distribution, probably inflammatory. Parenchymal bands and minimal patchy ground-glass opacity in the lower lobes, compatible with postinfectious/postinflammatory scarring and resolving pneumonia.  Upper abdomen: Simple 1.0 cm central liver cyst. Musculoskeletal: No aggressive appearing focal osseous lesions. Intact sternotomy wires. Marked thoracic spondylosis. IMPRESSION: 1. Persistent loculated thick-walled left pericardial fluid collections, most prominent posteriorly, unchanged since 11/10/2017 chest CT, with stable mass-effect on the left ventricle. 2. New clustered solid nodularity in the posterior left upper lobe up to 8 mm, probably inflammatory. Recommend attention on follow-up chest CT in 3-6 months. 3. Right upper lobe 1.4 cm ground-glass pulmonary nodule, stable since 11/10/2017 chest CT, minimally increased since 2008 chest CT. Left upper lobe 1.3 cm ground-glass pulmonary nodule, stable since 11/10/2017 chest CT, new since 2008 chest CT. Repeat noncontrast chest CT is recommended every 2 years until 5 years of stability has been established. This recommendation follows the consensus statement: Guidelines for Management of Incidental Pulmonary Nodules Detected on CT Images:From the Fleischner Society 2017; published online before print (10.1148/radiol.6222979892). 4. Postinfectious/postinflammatory scarring at the lung bases with nearly resolved basilar pneumonia. Aortic Atherosclerosis (ICD10-I70.0) and Emphysema (ICD10-J43.9). Electronically Signed   By: Ilona Sorrel M.D.   On: 01/20/2018 11:25   I have independently reviewed the above radiology studies  and reviewed the findings with the patient.    Dg Chest 2 View  Result Date: 11/11/2017 CLINICAL DATA:  History of CABG 08/24/2017.  No chest complaints. EXAM: CHEST  2 VIEW COMPARISON:  11/10/2017. FINDINGS: There is small area of scarring in the right lower lobe and lingula. There is no focal consolidation. There is no pleural effusion or pneumothorax. The heart and mediastinal contours are unremarkable. There is evidence of prior CABG. The osseous structures are unremarkable. IMPRESSION: No active cardiopulmonary disease.  Electronically Signed   By: Kathreen Devoid   On: 11/11/2017 10:00   Ct Chest Wo Contrast  Result Date: 11/10/2017 CLINICAL DATA:  Productive cough and shortness of breath for 2 months. COPD. Asbestos exposure. Follow-up pulmonary nodules. EXAM: CT CHEST WITHOUT CONTRAST TECHNIQUE: Multidetector CT imaging of the chest was performed following the standard protocol without IV contrast. COMPARISON:  08/13/2017 and 11/16/1998 and FINDINGS: Cardiovascular: Aortic and coronary artery atherosclerosis. Patient has undergone CABG since prior study. A loculated fluid collection is seen along the posterior left heart border which is new since previous study and measures 8.9 x 3.4 cm. This is consistent with a loculated pericardial effusion, and demonstrates mass effect on the left ventricle. Mediastinum/Nodes: No masses or pathologically enlarged lymph nodes identified on this unenhanced exam. Lungs/Pleura: Mild emphysema again seen. New atelectasis is seen both lower lungs. No evidence pleural effusion. No evidence of calcified pleural plaque. Right upper lobe ground-glass nodule measuring 11 mm shows no significant change  compared to previous study in 2008. A ground-glass nodule is seen in the left upper lobe measuring 12 mm on image 54/3, which is new since recent study 3 months ago, and is consistent with inflammatory etiology given its rapid appearance since the recent exam. No evidence of pulmonary consolidation or pleural effusion. Upper Abdomen:  Unremarkable. Musculoskeletal:  No suspicious bone lesions. IMPRESSION: Interval CABG. Moderate to large loculated pericardial effusion along posterior left heart border, with mass effect on left ventricle. Increased bilateral lower lung atelectasis. Stable right upper lobe ground-glass nodule compared to previous studies dating back to 2008. New 12 mm left upper lobe ground-glass nodule since recent study 3 months ago, most consistent with inflammatory etiology. Recommend  follow-up by chest CT without contrast in 6 months to confirm resolution. Aortic Atherosclerosis (ICD10-I70.0) and Emphysema (ICD10-J43.9). Electronically Signed   By: Earle Gell M.D.   On: 11/10/2017 09:31   Ct Maxillofacial Ltd Wo Cm  Result Date: 11/11/2017 CLINICAL DATA:  Postnasal drip EXAM: CT PARANASAL SINUS LIMITED WITHOUT CONTRAST TECHNIQUE: Non-contiguous multidetector CT images of the paranasal sinuses were obtained in a single plane without contrast. COMPARISON:  None. FINDINGS: Clear paranasal sinuses. Essentially midline nasal septum. No incidental osseous, orbital, or intracranial finding. IMPRESSION: Negative exam.  Clear paranasal sinuses. Electronically Signed   By: Monte Fantasia M.D.   On: 11/11/2017 09:39      Recent Lab Findings: Lab Results  Component Value Date   WBC 10.1 09/28/2017   HGB 9.2 (L) 09/28/2017   HCT 28.6 (L) 09/28/2017   PLT 324.0 09/28/2017   GLUCOSE 102 (H) 10/11/2017   ALT 18 08/20/2017   AST 13 (L) 08/20/2017   NA 142 10/11/2017   K 4.7 10/11/2017   CL 104 10/11/2017   CREATININE 1.27 10/11/2017   BUN 18 10/11/2017   CO2 25 10/11/2017   TSH 2.942 08/31/2017   INR 1.39 08/24/2017   HGBA1C 6.4 (H) 08/20/2017   Transthoracic Echocardiography  Patient:    Sky, Borboa MR #:       [id number] Study Date: 11/11/2017 Gender:     M Age:        84 Height:     180.3 cm Weight:     83.5 kg BSA:        2.05 m^2 Pt. Status: Room:   ATTENDING    Jenkins Rouge, M.D.  ORDERING     Lanelle Bal MD  REFERRING    Lanelle Bal MD  SONOGRAPHER  Marygrace Drought, RCS  PERFORMING   [facility], Outpatient  cc:  ------------------------------------------------------------------- LV EF: 60% -   65%  ------------------------------------------------------------------- Indications:      Cardiac Tamponade (I31.4).  ------------------------------------------------------------------- History:   PMH:  Hyperlipidemia, AAA.  PMH:    Myocardial infarction.  Risk factors:  Hypertension.  ------------------------------------------------------------------- Study Conclusions  - Left ventricle: The cavity size was normal. Wall thickness was   normal. Systolic function was normal. The estimated ejection   fraction was in the range of 60% to 65%. Doppler parameters are   consistent with both elevated ventricular end-diastolic filling   pressure and elevated left atrial filling pressure. - Left atrium: The atrium was mildly dilated. - Atrial septum: No defect or patent foramen ovale was identified. - Pericardium, extracardiac: Moderate localized posterior lateral   pericardial effusion no tamponade.  ------------------------------------------------------------------- Labs, prior tests, procedures, and surgery: Coronary artery bypass grafting.  ------------------------------------------------------------------- Study data:  Comparison was made to the study of 08/20/2017.  Study status:  Routine.  Procedure:  The patient reported no pain pre or post test. Transthoracic echocardiography. Image quality was adequate.          Transthoracic echocardiography.  M-mode, complete 2D, spectral Doppler, and color Doppler.  Birthdate: Patient birthdate: [birth year].  Age:  Patient is 84 yr old.  Sex: Gender: male.    BMI: 25.7 kg/m^2.  Blood pressure:     100/57 Patient status:  Outpatient.  Study date:  Study date: 11/11/2017. Study time: 01:12 PM.  Location:  [facility]  -------------------------------------------------------------------  ------------------------------------------------------------------- Left ventricle:  The cavity size was normal. Wall thickness was normal. Systolic function was normal. The estimated ejection fraction was in the range of 60% to 65%. Doppler parameters are consistent with both elevated ventricular end-diastolic filling pressure and elevated left atrial filling  pressure.  ------------------------------------------------------------------- Aortic valve:   Trileaflet; moderately thickened, moderately calcified leaflets. Sclerosis without stenosis.  ------------------------------------------------------------------- Aorta:  The aorta was normal, not dilated, and non-diseased.  ------------------------------------------------------------------- Mitral valve:   Doppler:  There was no significant regurgitation.  Peak gradient (D): 6 mm Hg.  ------------------------------------------------------------------- Left atrium:  The atrium was mildly dilated.  ------------------------------------------------------------------- Atrial septum:  No defect or patent foramen ovale was identified.   ------------------------------------------------------------------- Right ventricle:  The cavity size was normal. Wall thickness was normal. Systolic function was normal.  ------------------------------------------------------------------- Pulmonic valve:    Doppler:  There was mild regurgitation.  ------------------------------------------------------------------- Tricuspid valve:   Doppler:  There was mild regurgitation.  ------------------------------------------------------------------- Right atrium:  The atrium was normal in size.  ------------------------------------------------------------------- Pericardium:  Moderate localized posterior lateral pericardial effusion no tamponade.  ------------------------------------------------------------------- Systemic veins: Inferior vena cava: The vessel was normal in size. The respirophasic diameter changes were in the normal range (= 50%), consistent with normal central venous pressure. Diameter: 18 mm.  ------------------------------------------------------------------- Post procedure conclusions Ascending Aorta:  - The aorta was normal, not dilated, and  non-diseased.  ------------------------------------------------------------------- Measurements   IVC                                        Value        Reference  ID                                         18    mm     ----------    Left ventricle                             Value        Reference  LV ID, ED, PLAX chordal            (L)     38.7  mm     43 - 52  LV ID, ES, PLAX chordal                    29.8  mm     23 - 38  LV fx shortening, PLAX chordal     (L)     23    %      >=29  LV PW thickness, ED                        14.7  mm     ----------  IVS/LV PW ratio, ED                        0.76         <=1.3  Stroke volume, 2D                          88    ml     ----------  Stroke volume/bsa, 2D                      43    ml/m^2 ----------  LV e&', lateral                             6.64  cm/s   ----------  LV E/e&', lateral                           18.52        ----------  LV e&', medial                              7.83  cm/s   ----------  LV E/e&', medial                            15.71        ----------  LV e&', average                             7.24  cm/s   ----------  LV E/e&', average                           17           ----------    Ventricular septum                         Value        Reference  IVS thickness, ED                          11.2  mm     ----------    LVOT                                       Value        Reference  LVOT ID, S                                 21    mm     ----------  LVOT area                                  3.46  cm^2   ----------  LVOT peak velocity, S                      116   cm/s   ----------  LVOT mean velocity, S  81.9  cm/s   ----------  LVOT VTI, S                                25.5  cm     ----------  LVOT peak gradient, S                      5     mm Hg  ----------    Aorta                                      Value        Reference  Aortic root ID, ED                         35    mm      ----------    Left atrium                                Value        Reference  LA ID, A-P, ES                             40    mm     ----------  LA ID/bsa, A-P                             1.95  cm/m^2 <=2.2  LA volume, S                               46.7  ml     ----------  LA volume/bsa, S                           22.7  ml/m^2 ----------  LA volume, ES, 1-p A4C                     32.2  ml     ----------  LA volume/bsa, ES, 1-p A4C                 15.7  ml/m^2 ----------  LA volume, ES, 1-p A2C                     65.9  ml     ----------  LA volume/bsa, ES, 1-p A2C                 32.1  ml/m^2 ----------    Mitral valve                               Value        Reference  Mitral E-wave peak velocity                123   cm/s   ----------  Mitral A-wave peak velocity                88.3  cm/s   ----------  Mitral deceleration time  197   ms     150 - 230  Mitral peak gradient, D                    6     mm Hg  ----------  Mitral E/A ratio, peak                     1.4          ----------    Pulmonary arteries                         Value        Reference  PA pressure, S, DP                         18    mm Hg  <=30    Tricuspid valve                            Value        Reference  Tricuspid regurg peak velocity             193   cm/s   ----------  Tricuspid peak RV-RA gradient              15    mm Hg  ----------  Tricuspid maximal regurg velocity,         193   cm/s   ----------  PISA    Right atrium                               Value        Reference  RA ID, S-I, ES, A4C                (H)     59    mm     34 - 49  RA area, ES, A4C                           17.4  cm^2   8.3 - 19.5  RA volume, ES, A/L                         41.8  ml     ----------  RA volume/bsa, ES, A/L                     20.3  ml/m^2 ----------    Systemic veins                             Value        Reference  Estimated CVP                              3     mm Hg  ----------     Right ventricle                            Value        Reference  TAPSE  14.7  mm     ----------  RV pressure, S, DP                         18    mm Hg  <=30  RV s&', lateral, S                          8.81  cm/s   ----------  Legend: (L)  and  (H)  mark values outside specified reference range.  ------------------------------------------------------------------- Prepared and Electronically Authenticated by  Jenkins Rouge, M.D. 2019-02-28T16:25:03   Assessment / Plan:   1/ Moderate to large loculated pericardial effusion along posterior left heart border,-patient appears asymptomatic from this-without evidence of enlarging or symptoms would not recommend any operative procedure at this time.  Will obtain follow-up CT scan of the chest in 4 months  2/ right upper lobe 1.4 cm ground-glass pulmonary nodule, stable since 11/10/2017 chest CT, minimally increased since 2008 chest CT continue to follow with CT 3/ Left upper lobe 1.3 cm ground-glass pulmonary nodule, stable since 11/10/2017 chest CT, new since 2008 chest CT.- will continue to follow with CT   Grace Isaac MD      [address] [place] [address] Office [phone]   Beeper [phone]  01/20/2018 12:26 PM

## 2018-02-03 NOTE — Progress Notes (Signed)
Cardiology Office Note   Date:  02/04/2018   ID:  Tim [name], DOB [birth year], MRN [id number]  PCP:  Raelene Bott, MD  Cardiologist:  Dr. Marlou Porch    No chief complaint on file.     History of Present Illness: Tim [name] is a 84 y.o. male who presents for follow up of pericardial effusion.  Pt with hx of bilateral carotid artery disease,, coronary artery disease status post STEMI in 2001 with PCI to RCA, residual 100% circumflex, subsequent PCI for in-stent restenosis in June 2001, nuclear in 2014 with no scar, no ischemia 62%. He has had trouble with statins in the past, elevated CPK in the past. Former smoker quit in 1980. Here for follow up.  11/11/17 - Back in Nov of 2018 after his demand ischemia episode, he went for cath and this showed 3v disease, including sequential 70% and 50% proximal/mid LAD stenoses as well as chronic total occlusion of the proximal/mid LCx and distal RCA.,Moderate to severe in-stent restenosis in the mid/distal RCA. Pt then was evaluated by Dr. Servando Snare and pulmonary with Dr. Lake Bells for COPD eval for surgery. He has paralyzed Rt hemidiaphragm.   He underwent CABG X 4 08/24/17 with LIMA to LAD, VG to diagonal, VG to LCX, VG to PDA Discharged 09/03/17.   Complicated by PAF post op, amio. Pericarditis (T wave peaking on ECG). Wheezing on atenolol, changed to metoprolol. Dizzy with this. Edema. Stopped taking it. Tingling in chest. No pain since surgery he says. He wants to get back to work, [employer]...  Pericardia; effusion  Echo 2/26 with moderate localized post. Lateral pericardial effusion no tamponade.  CTA on 01/20/18 with persistent loculated thick walled lt. Pericardial fluikd conllections. Most prominent post. unchanged  No plans ro intervene, recheck Dr. Servando Snare in 4 months.    Today he feels great.  No pain and no SOB.  He is active and eating healthy.  BP is elevated today, he had labs done for leg cramps, he is now on Iron and  his Mg+ was normal.  He has not rec'd his repatha yet but has been approved.   Past Medical History:  Diagnosis Date  . AAA (abdominal aortic aneurysm) (HCC)    3.2 cm 07/2016; 2-3 year f/u recommended  . Asthma   . BPH (benign prostatic hypertrophy)   . CAD (coronary artery disease)    9 STEMI 2001, PCI, RCA, residual 100% circumflex  /   PCI for in-stent restenosis June, 2001  /  nuclear February, 2010, no ischemia  . Cancer ([place])    skin cancer removed  . Carotid artery disease ([place])    Doppler, June, 2011, 0-39% bilateral, pt denies  . Cervical disc disease    Status post neurosurgery  . Colon polyps   . Ejection fraction    EF 55-65%, echo, 2009  . Elevated CPK    Chronic mild CPK elevation  . Emphysema    Mild  . Fatigue    Morning fatigue, August, 2011  . GERD (gastroesophageal reflux disease)   . Headache    back of head  . Hyperlipidemia   . Hypertension   . Low back pain    February, 2013  . Myocardial infarction ([place])   . Paralyzed hemidiaphragm    Right  . Paralyzed hemidiaphragm    Chronic  . Statin intolerance    Elevated CPK in the past    Past Surgical History:  Procedure Laterality Date  . BREAST SURGERY  right breast removed  . CERVICAL DISC SURGERY    . CORONARY ARTERY BYPASS GRAFT N/A 08/24/2017   Procedure: CORONARY ARTERY BYPASS GRAFTING times four using left internal mammary artery and bilateral saphenous vein, using endoscope. TEE;  Surgeon: Grace Isaac, MD;  Location: [facility];  Service: Open Heart Surgery;  Laterality: N/A;  . LEFT HEART CATH AND CORONARY ANGIOGRAPHY N/A 07/29/2017   Procedure: LEFT HEART CATH AND CORONARY ANGIOGRAPHY;  Surgeon: Nelva Bush, MD;  Location: [facility];  Service: Cardiovascular;  Laterality: N/A;  . SKIN CANCER EXCISION    . TEE WITHOUT CARDIOVERSION N/A 08/24/2017   Procedure: TRANSESOPHAGEAL ECHOCARDIOGRAM (TEE);  Surgeon: Grace Isaac, MD;  Location: [facility];  Service: Open Heart  Surgery;  Laterality: N/A;     Current Outpatient Medications  Medication Sig Dispense Refill  . albuterol (PROVENTIL HFA;VENTOLIN HFA) 108 (90 BASE) MCG/ACT inhaler Inhale 2 puffs into the lungs every 6 (six) hours as needed for wheezing or shortness of breath. 1 Inhaler 6  . albuterol (PROVENTIL) (2.5 MG/3ML) 0.083% nebulizer solution Take 3 mLs (2.5 mg total) by nebulization every 6 (six) hours as needed for wheezing or shortness of breath. 75 mL 12  . aspirin EC 81 MG tablet Take 81 mg by mouth daily.    [name] diltiazem (CARDIZEM CD) 120 MG 24 hr capsule Take 1 capsule (120 mg total) by mouth daily. 90 capsule 3  . doxycycline (PERIOSTAT) 20 MG tablet Take 20 mg by mouth daily.    . Evolocumab (REPATHA SURECLICK) 443 MG/ML SOAJ Inject 1 pen into the skin every 14 (fourteen) days. 2 pen 11  . finasteride (PROSCAR) 5 MG tablet Take 5 mg by mouth 2 (two) times daily.     . fluticasone furoate-vilanterol (BREO ELLIPTA) 100-25 MCG/INH AEPB Inhale 1 puff into the lungs daily. 1 each 0  . IRON PO Take by mouth as directed.    . montelukast (SINGULAIR) 10 MG tablet Take 10 mg by mouth at bedtime.    . Multiple Vitamins-Minerals (ICAPS PO) Take 1 capsule by mouth daily.    [name] omeprazole (PRILOSEC) 40 MG capsule Take 1 capsule (40 mg total) by mouth 2 (two) times daily. 60 capsule 5  . potassium chloride SA (K-DUR,KLOR-CON) 20 MEQ tablet Take 1 tablet (20 mEq total) by mouth daily. 90 tablet 3  . sodium chloride HYPERTONIC 3 % nebulizer solution Take by nebulization 2 (two) times daily. 300 mL 11  . tamsulosin (FLOMAX) 0.4 MG CAPS capsule Take 0.4 mg by mouth 2 (two) times daily. For urinary symptoms     . furosemide (LASIX) 40 MG tablet Take 1 tablet (40 mg total) by mouth daily. 90 tablet 3   No current facility-administered medications for this visit.     Allergies:   Statins    Social History:  The patient  reports that he quit smoking about 42 years ago. His smoking use included cigarettes. He  has a 120.00 pack-year smoking history. He has never used smokeless tobacco. He reports that he does not drink alcohol or use drugs.   Family History:  The patient's family history includes Cancer in his brother; Diabetes in his mother and sister; Kidney disease in his brother; Other in his father and sister.    ROS:  General:no colds or fevers, no weight changes Skin:no rashes or ulcers HEENT:no blurred vision, no congestion CV:see HPI PUL:see HPI GI:no diarrhea constipation or melena, no indigestion GU:no hematuria, no dysuria MS:no joint pain, no  claudication Neuro:no syncope, no lightheadedness Endo:no diabetes, no thyroid disease  Wt Readings from Last 3 Encounters:  02/04/18 189 lb (85.7 kg)  01/20/18 184 lb 9.6 oz (83.7 kg)  12/29/17 183 lb 6.4 oz (83.2 kg)     PHYSICAL EXAM: VS:  BP (!) 162/72   Pulse 97   Ht 5\' 11"  (1.803 m)   Wt 189 lb (85.7 kg)   SpO2 98%   BMI 26.36 kg/m  , BMI Body mass index is 26.36 kg/m. General:Pleasant affect, NAD Skin:Warm and dry, brisk capillary refill HEENT:normocephalic, sclera clear, mucus membranes moist Neck:supple, no JVD, no bruits  Heart:S1S2 RRR without murmur, gallup, rub or click Lungs:dimiinished breath sounds X 3 without rales, rhonchi, or wheezes EVO:JJKK, non tender, + BS, do not palpate liver spleen or masses Ext: lower ext edema just at ankles, ,, 2+ radial pulses Neuro:alert and oriented X 3, MAE, follows commands, + facial symmetry    EKG:  EKG is not ordered today.   Recent Labs: 08/20/2017: ALT 18 08/25/2017: Magnesium 2.5 08/31/2017: TSH 2.942 09/28/2017: Hemoglobin 9.2; Platelets 324.0 10/11/2017: BUN 18; Creatinine, Ser 1.27; Potassium 4.7; Sodium 142    Lipid Panel No results found for: CHOL, TRIG, HDL, CHOLHDL, VLDL, LDLCALC, LDLDIRECT     Other studies Reviewed: Additional studies/ records that were reviewed today include:  Cardiac cath 07/29/17 .Conclusion   Conclusions: 1. Significant  three-vessel coronary artery disease, including sequential 70% and 50% proximal/mid LAD stenoses as well as chronic total occlusion of the proximal/mid LCx and distal RCA. 2. Moderate to severe in-stent restenosis in the mid/distal RCA. 3. Normal left ventricular contraction and filling pressure.  Recommendations: 1. Cardiac surgery consultation, given significant three vessel coronary artery disease. OM3 and rPDA are supplied by collaterals. 2. If patient is not a surgical candidate, PCI to proximal/mid LAD could be considered, with continued medical management of chronic total occlusions of the LCx and RCA. 3. Aggressive secondary prevention. Consider retrial of statin versus evaluation for PCSK9 inhibitor therapy. 4. Continue atenolol and isosorbide mononitrate, to be uptitrated as tolerated.     ECHO 08/20/17 Study Conclusions  - Left ventricle: The cavity size was normal. Systolic function was normal. The estimated ejection fraction was in the range of 55% to 60%. Probable hypokinesis of the basal-midinferolateral and inferior myocardium; consistent with ischemia in the distribution of the right coronary or left circumflex coronary artery. Doppler parameters are consistent with abnormal left ventricular relaxation (grade 1 diastolic dysfunction). - Left atrium: The atrium was mildly dilated.  Vas dopplers 08/20/17 Final Interpretation: Right Carotid: There is evidence in the right ICA of a 1-39% stenosis.  Left Carotid: There is evidence in the left ICA of a 40-59% stenosis. Vertebrals: Both vertebral arteries were patent with antegrade flow. Subclavians:  Right ABI: Resting right ankle-brachial index is within normal range. No evidence of significant right lower extremity arterial disease. Left ABI: Resting left ankle-brachial index indicates mild right lower extremity arterial disease. Right Upper Extremity: No significant arterial obstruction detected in the  right upper extremity. Doppler waveform obliterate with compression. Doppler waveforms remain within normal limits with compression. Left Upper Extremity: No significant arterial obstruction detected in the left upper extremity. Doppler waveform obliterate with compression. Doppler waveforms decrease <50% w/compression.   CABG 08/24/17 CORONARY ARTERY BYPASS GRAFTINGx 4 (LIMA to LAD, SVG to DIAGONAL, SVG to CIRCUMFLEX, SVG to PDA)using left internal mammary artery and bilateral greatersaphenous veinvia EVH  Chest CT 01/20/18 IMPRESSION: 1. Persistent loculated thick-walled left pericardial fluid  collections, most prominent posteriorly, unchanged since 11/10/2017 chest CT, with stable mass-effect on the left ventricle. 2. New clustered solid nodularity in the posterior left upper lobe up to 8 mm, probably inflammatory. Recommend attention on follow-up chest CT in 3-6 months. 3. Right upper lobe 1.4 cm ground-glass pulmonary nodule, stable since 11/10/2017 chest CT, minimally increased since 2008 chest CT. Left upper lobe 1.3 cm ground-glass pulmonary nodule, stable since 11/10/2017 chest CT, new since 2008 chest CT. Repeat noncontrast chest CT is recommended every 2 years until 5 years of stability has been established. This recommendation follows the consensus statement: Guidelines for Management of Incidental Pulmonary Nodules Detected on CT Images:From the Fleischner Society 2017; published online before print (10.1148/radiol.4665993570). 4. Postinfectious/postinflammatory scarring at the lung bases with nearly resolved basilar pneumonia.  Aortic Atherosclerosis (ICD10-I70.0) and Emphysema (ICD10-J43.9).    ASSESSMENT AND PLAN:  1.  Pericardial effusion post CABG X 4, followed by Dr. Servando Snare and stable on recent CT of chest.  Pt asymptomatic. Follow up with Dr. Marlou Porch in 3 months.  Prior CAD with MI and RCA stent in 2001.  2.  BP elevated today but all others have been  WNL.    3.  PAF with no rapid HR or palpitations.  On exam RRR.  4.  HLD has been unable to take statins, he has been approved for Repatha has not yet rec'd, if not rec'd in a week to call lipid clinic.   5    carotid stenosis 08/2017 with L ICA 40-59% stenosis , Rt with 1-39% stenosis.  6.  COPD and pulmonary nodule has seen pulmonary.   Current medicines are reviewed with the patient today.  The patient Has no concerns regarding medicines.  The following changes have been made:  See above Labs/ tests ordered today include:see above  Disposition:   FU:  see above  Signed, Cecilie Kicks, NP  02/04/2018 9:03 AM    [facility], [place], [place], [place] Phone: [phone]; Fax: [phone]

## 2018-02-04 ENCOUNTER — Encounter: Payer: Self-pay | Admitting: Cardiology

## 2018-02-04 ENCOUNTER — Ambulatory Visit (INDEPENDENT_AMBULATORY_CARE_PROVIDER_SITE_OTHER): Payer: Medicare Other | Admitting: Cardiology

## 2018-02-04 VITALS — BP 162/72 | HR 97 | Ht 71.0 in | Wt 189.0 lb

## 2018-02-04 DIAGNOSIS — Z951 Presence of aortocoronary bypass graft: Secondary | ICD-10-CM

## 2018-02-04 DIAGNOSIS — I313 Pericardial effusion (noninflammatory): Secondary | ICD-10-CM

## 2018-02-04 DIAGNOSIS — I10 Essential (primary) hypertension: Secondary | ICD-10-CM | POA: Diagnosis not present

## 2018-02-04 DIAGNOSIS — E7800 Pure hypercholesterolemia, unspecified: Secondary | ICD-10-CM | POA: Diagnosis not present

## 2018-02-04 DIAGNOSIS — I3139 Other pericardial effusion (noninflammatory): Secondary | ICD-10-CM

## 2018-02-04 DIAGNOSIS — E876 Hypokalemia: Secondary | ICD-10-CM | POA: Diagnosis not present

## 2018-02-04 DIAGNOSIS — I2510 Atherosclerotic heart disease of native coronary artery without angina pectoris: Secondary | ICD-10-CM

## 2018-02-04 LAB — BASIC METABOLIC PANEL
BUN/Creatinine Ratio: 21 (ref 10–24)
BUN: 24 mg/dL (ref 8–27)
CO2: 23 mmol/L (ref 20–29)
Calcium: 8.7 mg/dL (ref 8.6–10.2)
Chloride: 105 mmol/L (ref 96–106)
Creatinine, Ser: 1.15 mg/dL (ref 0.76–1.27)
GFR calc Af Amer: 66 mL/min/{1.73_m2} (ref >59)
GFR calc non Af Amer: 57 mL/min/{1.73_m2} — ABNORMAL LOW (ref >59)
Glucose: 105 mg/dL — ABNORMAL HIGH (ref 65–99)
Potassium: 4.1 mmol/L (ref 3.5–5.2)
Sodium: 142 mmol/L (ref 134–144)

## 2018-02-04 NOTE — Patient Instructions (Addendum)
Medication Instructions:  Your physician recommends that you continue on your current medications as directed. Please refer to the Current Medication list given to you today.   Labwork: TODAY:  BMET  Testing/Procedures: None ordered  Follow-Up: Your physician recommends that you schedule a follow-up appointment in: 3 MONTHS WITH DR. Marlou Porch   Any Other Special Instructions Will Be Listed Below (If Applicable).     If you need a refill on your cardiac medications before your next appointment, please call your pharmacy.

## 2018-02-06 ENCOUNTER — Encounter: Payer: Self-pay | Admitting: Cardiology

## 2018-02-08 ENCOUNTER — Telehealth: Payer: Self-pay | Admitting: Cardiology

## 2018-02-08 NOTE — Telephone Encounter (Signed)
Returned daughter, [name], [place] on file, call. See result note.

## 2018-02-08 NOTE — Telephone Encounter (Signed)
New Message   Patient is returning call for lab results.  

## 2018-02-25 ENCOUNTER — Telehealth: Payer: Self-pay | Admitting: Pharmacist

## 2018-02-25 NOTE — Telephone Encounter (Signed)
LMOM for pt to see if he has started Repatha injections approved in May.

## 2018-03-19 ENCOUNTER — Other Ambulatory Visit: Payer: Self-pay | Admitting: Pulmonary Disease

## 2018-03-19 IMAGING — DX DG CHEST 1V PORT
1 series · 1 of 1 positions shown · non-contrast
Comparison: 08/28/2017

CLINICAL DATA: Pleural effusion

EXAM:
PORTABLE CHEST 1 VIEW

[chest]
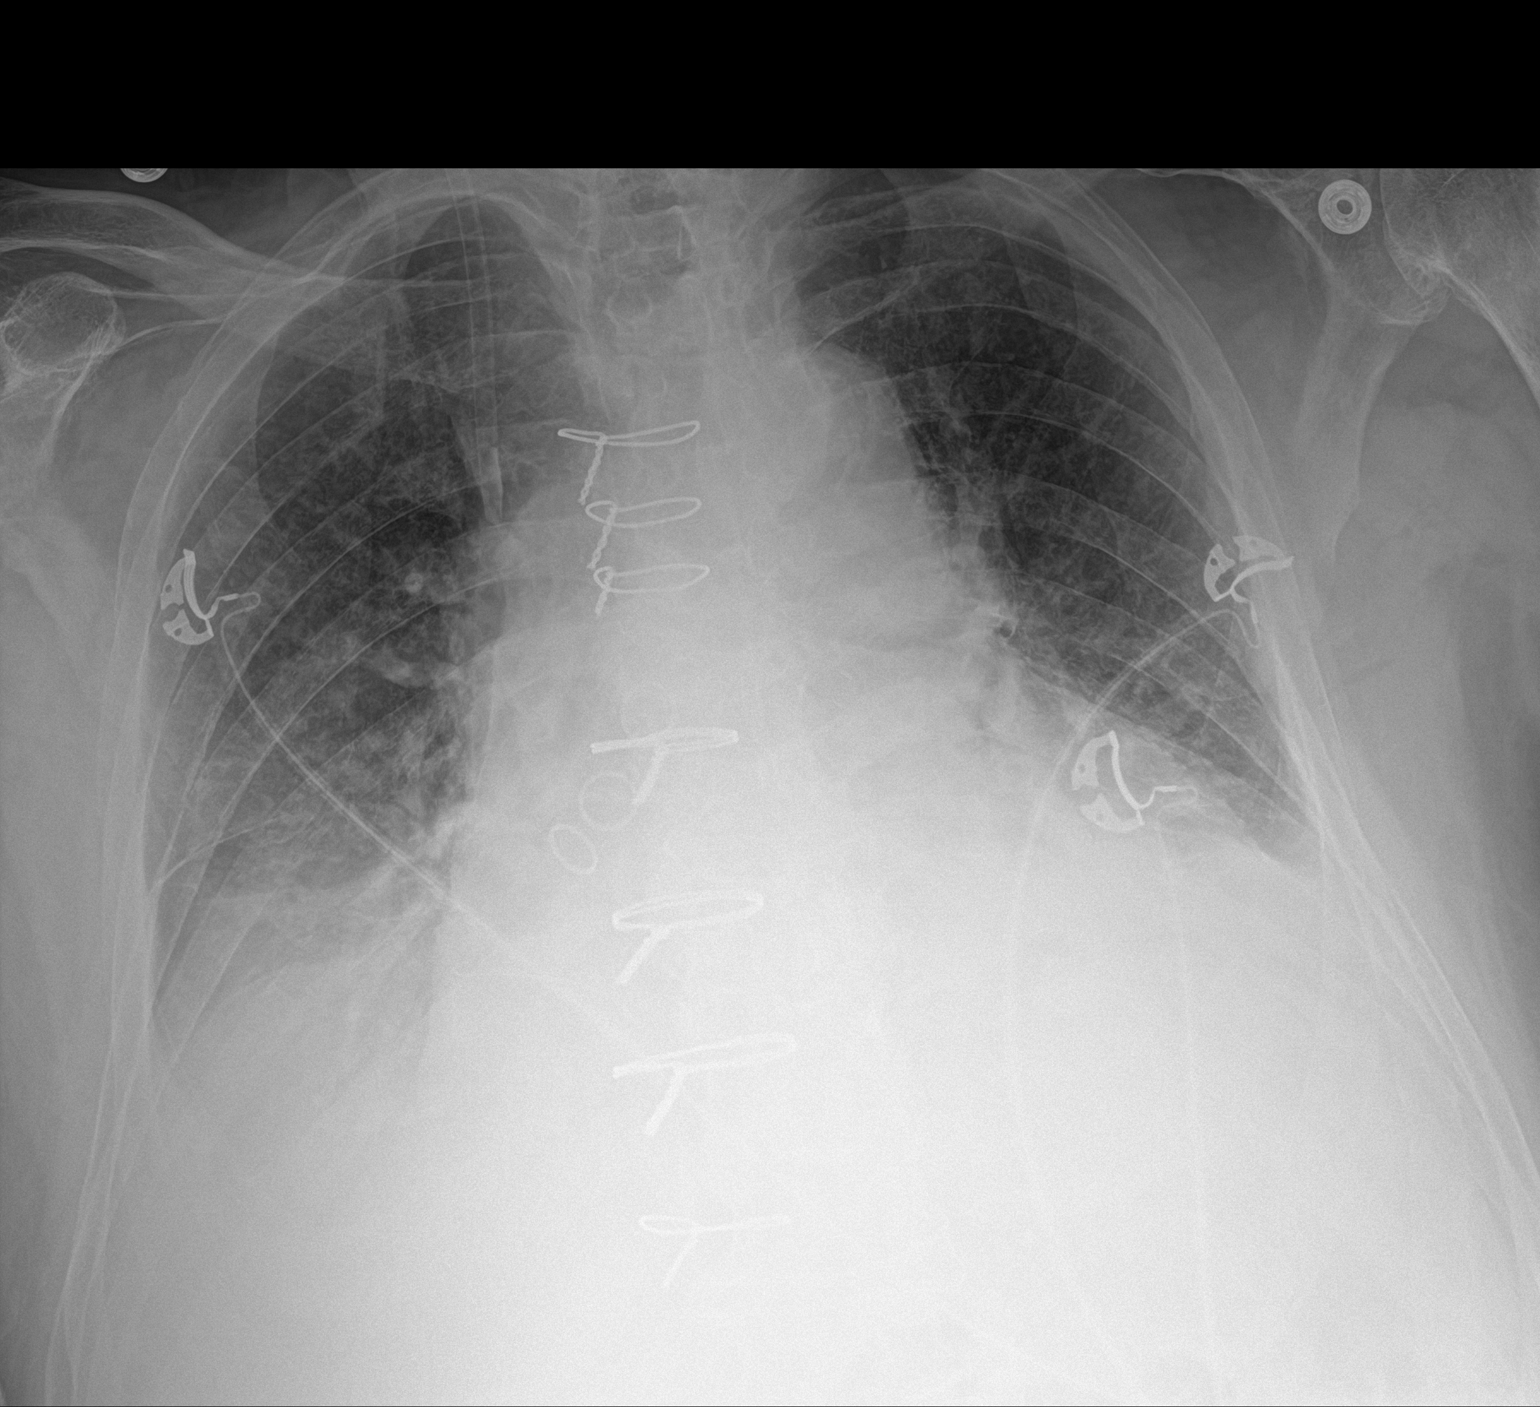

[1 of 1 positions shown; findings below may reference images not displayed]

FINDINGS: Cardiac shadow remains enlarged. Right jugular sheath is again seen.
Postsurgical changes in the cervical spine are again noted. Small
bilateral pleural effusions are noted. Slight increase is noted on
the right although this may be positional in nature. Mild bibasilar
atelectatic changes are seen.
IMPRESSION: Slight apparent increase in right pleural effusion when compare with
the prior exam. This may be in part positional in nature. The
remainder of the study is stable from the prior exam.

## 2018-03-21 IMAGING — DX DG CHEST 2V
2 series · 2 of 2 positions shown · non-contrast
Comparison: 08/30/2017

CLINICAL DATA: CABG.

EXAM:
CHEST  2 VIEW

[chest pa]
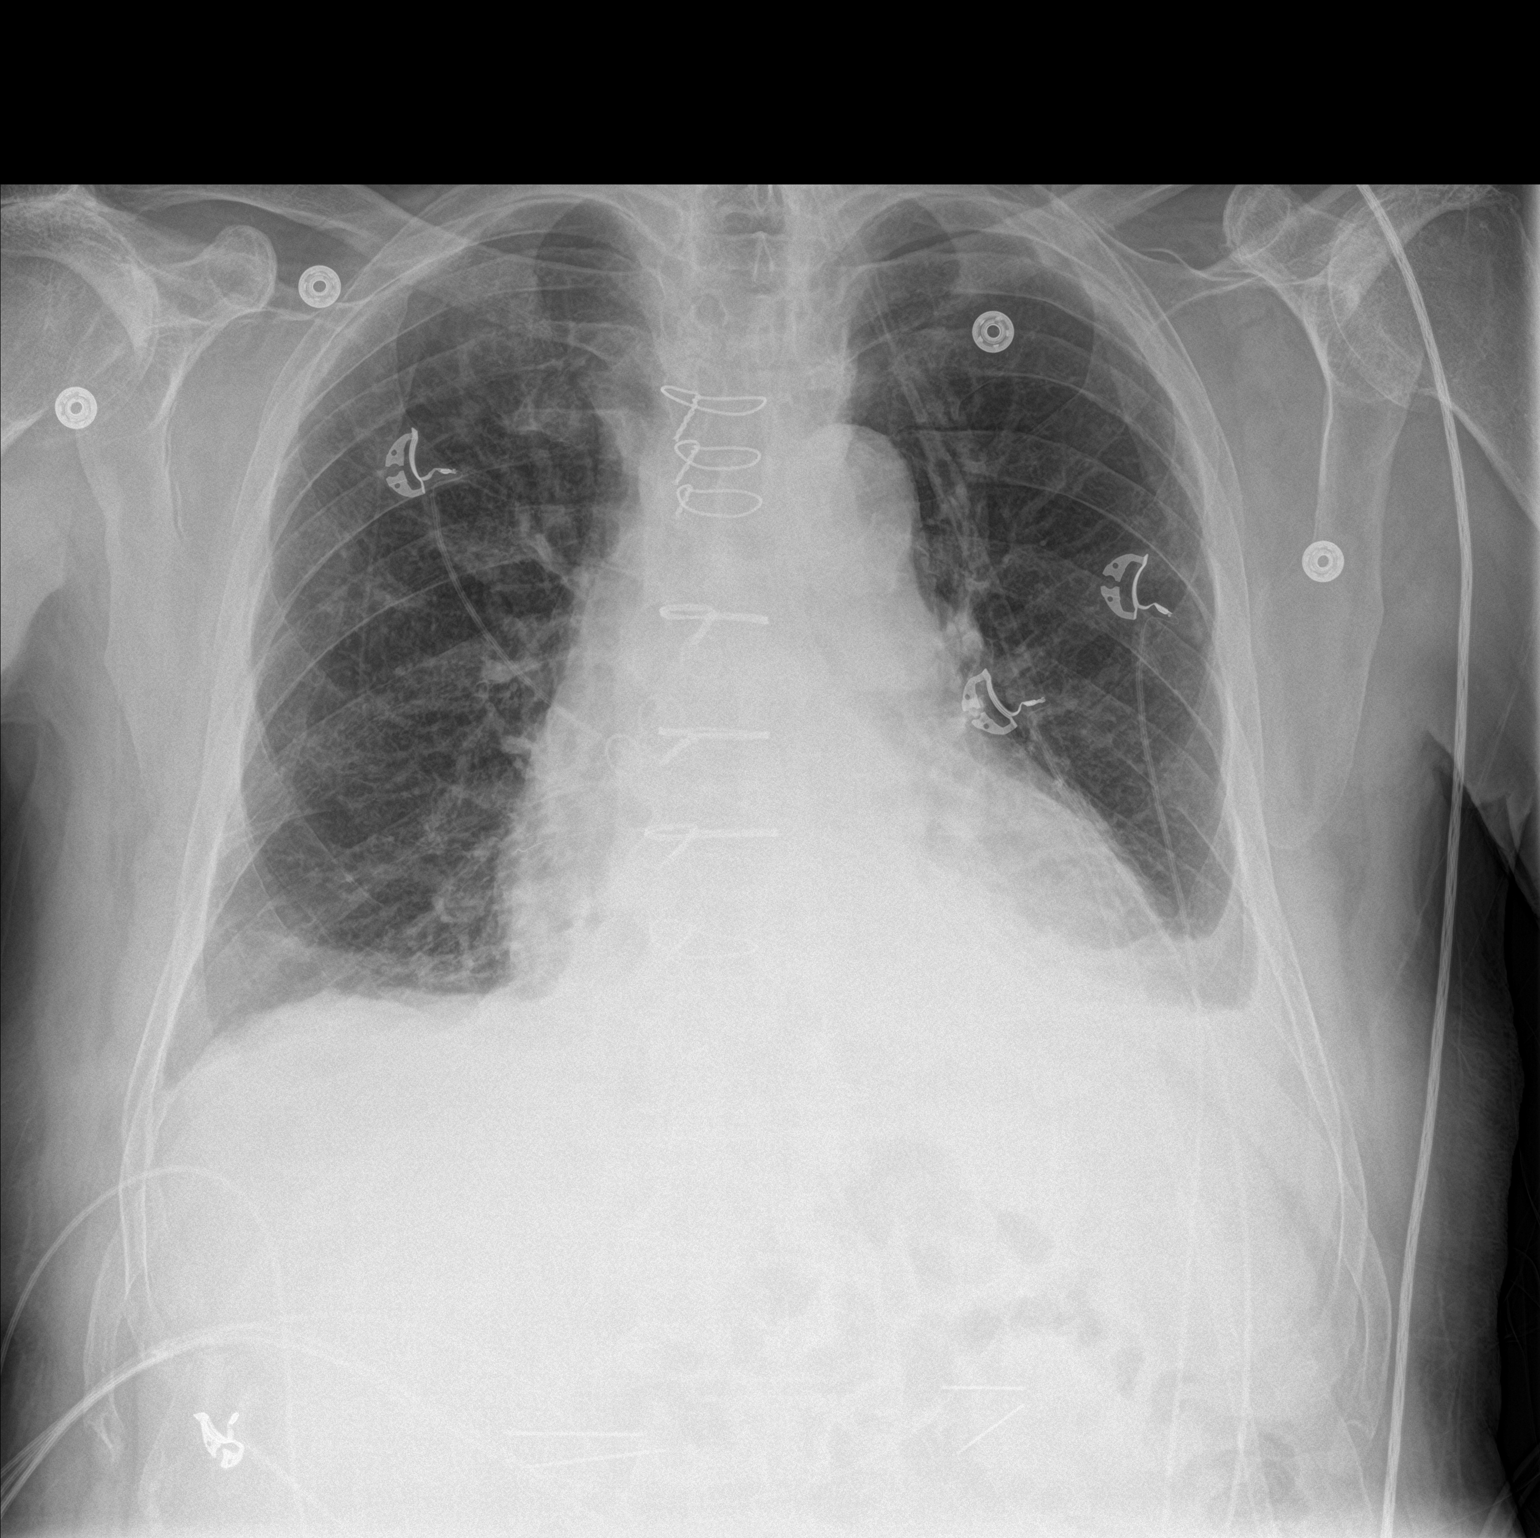

[chest lat]
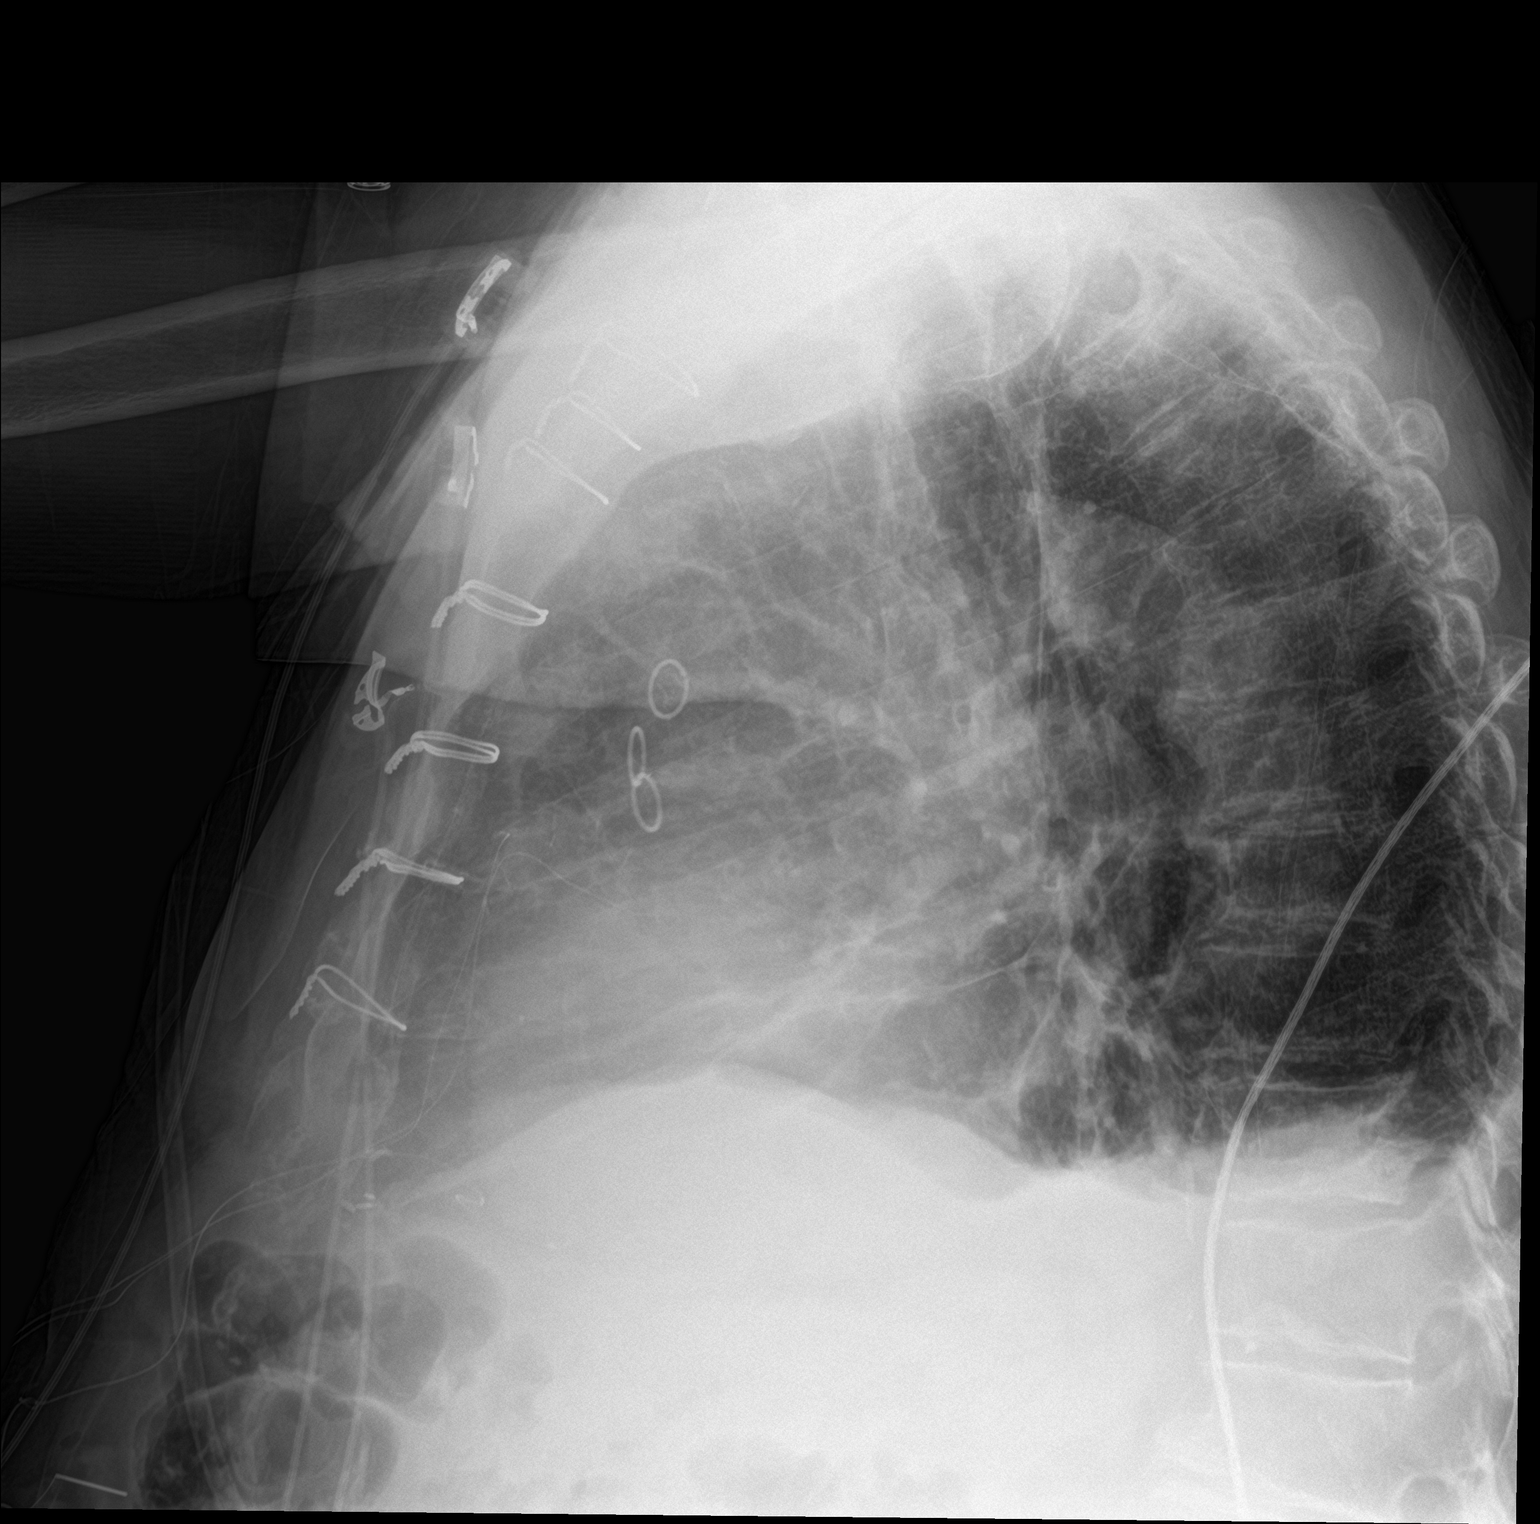

[2 of 2 positions shown; findings below may reference images not displayed]

FINDINGS: Sequelae of CABG are again identified. The right jugular sheath has
been removed. The cardiomediastinal silhouette is unchanged. Basilar
lung aeration has improved, with mild atelectasis remaining
bilaterally. There are persistent small pleural effusions, left
larger than right. No pneumothorax is identified. No acute osseous
abnormality is seen.
IMPRESSION: 1. Interval removal of right jugular sheath.
2. Improved basilar lung aeration. Mild bibasilar atelectasis and
persistent small pleural effusions.

## 2018-03-30 ENCOUNTER — Other Ambulatory Visit: Payer: Self-pay | Admitting: Pulmonary Disease

## 2018-03-30 DIAGNOSIS — R918 Other nonspecific abnormal finding of lung field: Secondary | ICD-10-CM

## 2018-04-12 ENCOUNTER — Telehealth: Payer: Self-pay | Admitting: Cardiology

## 2018-04-12 MED ORDER — EZETIMIBE 10 MG PO TABS: 10.0000 mg | ORAL_TABLET | Freq: Every day | ORAL | 11 refills | Status: DC

## 2018-04-12 NOTE — Telephone Encounter (Addendum)
Returned call to [employer]. Pt reports feeling like he has the flu after his second injection of Repatha and symptoms worsened after his 3rd injection last week. Will stop Repatha injections and update allergy list.   Pt also intolerant to Lipitor, Crestor, Pravachol, and Zocor (muscle aches and CPK elevations). Will try starting Zetia 10mg  daily after his symptoms resolve. He will not qualify for any clinical trials at this time unfortunately.  Will route to Dr Marlou Porch as an Juluis Rainier. Pt has f/u in 1 month and lipid panel can be scheduled at that time if he is tolerating Zetia well.

## 2018-04-12 NOTE — Telephone Encounter (Signed)
New message   Pt c/o medication issue:  1. Name of Medication: Evolocumab (REPATHA SURECLICK) 992 MG/ML SOAJ  2. How are you currently taking this medication (dosage and times per day)? 140 mg shot  3. Are you having a reaction (difficulty breathing--STAT)? Throat is little tight, Body aches   4. What is your medication issue? Patient is experiencing body aches.

## 2018-04-13 NOTE — Telephone Encounter (Signed)
Thanks for update °Mark Skains, MD ° °

## 2018-04-20 ENCOUNTER — Ambulatory Visit (INDEPENDENT_AMBULATORY_CARE_PROVIDER_SITE_OTHER)
Admission: RE | Admit: 2018-04-20 | Discharge: 2018-04-20 | Disposition: A | Discharge status: Home / Self Care | Payer: Medicare Other | Source: Ambulatory Visit | Attending: Pulmonary Disease | Admitting: Pulmonary Disease

## 2018-04-20 ENCOUNTER — Telehealth: Payer: Self-pay | Admitting: Pulmonary Disease

## 2018-04-20 DIAGNOSIS — R918 Other nonspecific abnormal finding of lung field: Secondary | ICD-10-CM

## 2018-04-20 NOTE — Telephone Encounter (Signed)
I called Tim [name] but ended up talking to his daughter instead.  I wanted to go over his CT report that showed nodules. I'm not sure why these have suddenly shown up so quickly, so I'd like for him to come in to be seen so we can assess for inflammatory or infectious causes prior to considering a PET/CT.  Will route to triage to arrange an acute visit.

## 2018-04-20 NOTE — Telephone Encounter (Signed)
That works!

## 2018-04-20 NOTE — Telephone Encounter (Signed)
Reviewed patient's chart. It appears patient has been scheduled for an appt with you on 05/04/18. Please advise if this appt is ok or if it needs to be sooner.   Thanks!

## 2018-04-21 ENCOUNTER — Other Ambulatory Visit: Payer: Self-pay | Admitting: Pulmonary Disease

## 2018-04-22 ENCOUNTER — Other Ambulatory Visit: Payer: Self-pay | Admitting: Cardiothoracic Surgery

## 2018-04-22 DIAGNOSIS — R911 Solitary pulmonary nodule: Secondary | ICD-10-CM

## 2018-04-25 ENCOUNTER — Encounter: Payer: Self-pay | Admitting: Nurse Practitioner

## 2018-04-25 ENCOUNTER — Ambulatory Visit: Payer: Medicare Other | Admitting: Nurse Practitioner

## 2018-04-25 VITALS — BP 122/60 | HR 95 | Ht 71.0 in | Wt 184.8 lb

## 2018-04-25 DIAGNOSIS — J411 Mucopurulent chronic bronchitis: Secondary | ICD-10-CM

## 2018-04-25 DIAGNOSIS — J189 Pneumonia, unspecified organism: Secondary | ICD-10-CM | POA: Diagnosis not present

## 2018-04-25 MED ORDER — DOXYCYCLINE HYCLATE 100 MG PO TABS: 100.0000 mg | ORAL_TABLET | Freq: Two times a day (BID) | ORAL | 0 refills | Status: DC

## 2018-04-25 MED ORDER — DOXYCYCLINE HYCLATE 100 MG PO TABS: 100.0000 mg | ORAL_TABLET | Freq: Two times a day (BID) | ORAL | 0 refills | Status: AC

## 2018-04-25 NOTE — Patient Instructions (Signed)
CAP (community acquired pneumonia) Will order doxycycline Deep breathing exercises discussed Please call with any worsening symptoms  COPD (chronic obstructive pulmonary disease) ([place]) Continue current medications Please keep follow up with Dr. Lake Bells  Keep taking Memory Dance daily Keep taking Singulair Keep using hypertonic saline before bedtime Keep using albuterol as needed for chest tightness wheezing or shortness of breath

## 2018-04-25 NOTE — Progress Notes (Signed)
@Patient  ID: Tim [name], male    DOB: Jul 17, 1929, 84 y.o.   MRN: [id number]  Chief Complaint  Patient presents with  . Follow-up    Chest tighness for the last 3 - 4 days with cough, get SOB much more frequently, since getting his 3rd Repatha injection. Had heart surgery in December 2018.    Referring provider: Raelene Bott, MD  84 year old male former smoker (quit 1980), with COPD and right hemidiaphragm. Followed by Dr. Lake Bells. Health history includes asbestos exposure, MI, AAA, CAD, HTN, GERD, BPH.   HPI  Patient presents today for productive cough and shortness of breath. States that for the past couple of weeks he has had a productive cough with thick yellow sputum and has been short of breath. He denies any fever, but states that he has had chills. States that he has had some chest pain, but states that his chest hurts along incision line (CABG 2018) when coughing. He recently was started on repatha and reported flu like symptoms with the medication - he contacted cardiology and the medication was discontinued.   Recent [facility] Encounters:   OV 12-29-17 Discussion:  In general this is been a stable interval for [name].  He still has severe symptoms from his asthma COPD overlap syndrome but in general things have improved.  I am still considering whether or not he would benefit from a biologic agent but considering his mild improvement I would prefer to hold off at this time as those agents are incredibly expensive.  If he has another flareup of bronchitis or asthma within the next 4 months then we will need to start something like Dupilumab.   Plan: Allergic rhinitis: Continue Singulair Continue saline rinses Continue Nasacort Continue Zyrtec  Gastroesophageal reflux disease: Continue Prilosec twice a day  Pulmonary nodules: You will have another CT scan of your chest in August, I will see you after that  Pericardial effusion: Continue  follow-up with Dr. Servando Snare  COPD asthma overlap syndrome: Keep taking Breo daily Keep taking Singulair Keep using hypertonic saline before bedtime Keep using albuterol as needed for chest tightness wheezing or shortness of breath If you have any new or worsening chest symptoms like bronchitis come back and see me prior to the next visit   Tests:   Lung function testing 2005 showed clear airflow obstruction, FEV1 of 1.53 L (43% predicted), total lung capacity 62% predicted, DLCO 73% predicted.   Imaging:  CT: 8--7-19 IMPRESSION: New 2.2 x 3.7 cm irregular/spiculated solid mass in the anterior right upper lobe.  New irregular solid nodules in the bilateral upper lobes measuring 15-18 mm.  In the appropriate clinical setting, this appearance may reflect multifocal pneumonia. In the absence of infectious symptoms, tumor is not excluded, although such rapid progression is atypical.  Consider short-term follow-up CT chest or PET in 4-6 weeks after appropriate antimicrobial therapy     Allergies  Allergen Reactions  . Statins Other (See Comments)    CLASS EFFECT SEVERE LEG CRAMPS [MULTIPLE STATINS]  . Repatha [Evolocumab]     Flu like symptoms    Immunization History  Administered Date(s) Administered  . Influenza Split 06/10/2011, 07/08/2012, 06/14/2014, 05/30/2015  . Influenza Whole 06/14/2009, 05/26/2010  . Influenza,inj,Quad PF,6+ Mos 06/14/2013, 05/28/2014, 05/30/2015, 05/25/2016, 06/25/2017  . Pneumococcal Conjugate-13 06/14/2014  . Pneumococcal Polysaccharide-23 05/24/2009    Past Medical History:  Diagnosis Date  . AAA (abdominal aortic aneurysm) (HCC)    3.2 cm 07/2016; 2-3 year f/u  recommended  . Asthma   . BPH (benign prostatic hypertrophy)   . CAD (coronary artery disease)    9 STEMI 2001, PCI, RCA, residual 100% circumflex  /   PCI for in-stent restenosis June, 2001  /  nuclear February, 2010, no ischemia  . Cancer ([place])    skin cancer removed    . Carotid artery disease ([place])    Doppler, June, 2011, 0-39% bilateral, pt denies  . Cervical disc disease    Status post neurosurgery  . Colon polyps   . Ejection fraction    EF 55-65%, echo, 2009  . Elevated CPK    Chronic mild CPK elevation  . Emphysema    Mild  . Fatigue    Morning fatigue, August, 2011  . GERD (gastroesophageal reflux disease)   . Headache    back of head  . Hyperlipidemia   . Hypertension   . Low back pain    February, 2013  . Myocardial infarction ([place])   . Paralyzed hemidiaphragm    Right  . Paralyzed hemidiaphragm    Chronic  . Statin intolerance    Elevated CPK in the past    Tobacco History: Social History   Tobacco Use  Smoking Status Former Smoker  . Packs/day: 3.00  . Years: 40.00  . Pack years: 120.00  . Types: Cigarettes  . Last attempt to quit: 09/15/1975  . Years since quitting: 42.6  Smokeless Tobacco Never Used   Counseling given: Former smoker   Outpatient Encounter Medications as of 04/25/2018  Medication Sig  . albuterol (PROVENTIL HFA;VENTOLIN HFA) 108 (90 BASE) MCG/ACT inhaler Inhale 2 puffs into the lungs every 6 (six) hours as needed for wheezing or shortness of breath.  [name] albuterol (PROVENTIL) (2.5 MG/3ML) 0.083% nebulizer solution Take 3 mLs (2.5 mg total) by nebulization every 6 (six) hours as needed for wheezing or shortness of breath.  [name] aspirin EC 81 MG tablet Take 81 mg by mouth daily.  [name] BREO ELLIPTA 100-25 MCG/INH AEPB INHALE 1 PUFF DAILY INTO THE LUNGS.  [name] ezetimibe (ZETIA) 10 MG tablet Take 1 tablet (10 mg total) by mouth daily.  . IRON PO Take by mouth as directed.  . montelukast (SINGULAIR) 10 MG tablet Take 10 mg by mouth at bedtime.  . Multiple Vitamins-Minerals (ICAPS PO) Take 1 capsule by mouth daily.  [name] omeprazole (PRILOSEC) 40 MG capsule TAKE 1 CAPSULE BY MOUTH TWICE A DAY  . potassium chloride SA (K-DUR,KLOR-CON) 20 MEQ tablet Take 1 tablet (20 mEq total) by mouth daily.  . sodium chloride  HYPERTONIC 3 % nebulizer solution Take by nebulization 2 (two) times daily.  . tamsulosin (FLOMAX) 0.4 MG CAPS capsule Take 0.4 mg by mouth 2 (two) times daily. For urinary symptoms   . [DISCONTINUED] doxycycline (PERIOSTAT) 20 MG tablet Take 20 mg by mouth daily.  [name] diltiazem (CARDIZEM CD) 120 MG 24 hr capsule Take 1 capsule (120 mg total) by mouth daily.  [name] doxycycline (VIBRA-TABS) 100 MG tablet Take 1 tablet (100 mg total) by mouth 2 (two) times daily for 10 days.  . finasteride (PROSCAR) 5 MG tablet Take 5 mg by mouth 2 (two) times daily.   . furosemide (LASIX) 40 MG tablet Take 1 tablet (40 mg total) by mouth daily.  . [DISCONTINUED] doxycycline (VIBRA-TABS) 100 MG tablet Take 1 tablet (100 mg total) by mouth 2 (two) times daily.  . [DISCONTINUED] fluticasone furoate-vilanterol (BREO ELLIPTA) 100-25 MCG/INH AEPB Inhale 1 puff into the lungs daily. (Patient not  taking: Reported on 04/25/2018)   No facility-administered encounter medications on file as of 04/25/2018.      Review of Systems  Review of Systems  Constitutional: Positive for chills. Negative for activity change, appetite change and fever.  HENT: Negative.   Respiratory: Positive for cough, chest tightness and shortness of breath. Negative for wheezing.   Cardiovascular: Negative.   Gastrointestinal: Negative.   Allergic/Immunologic: Negative.   Psychiatric/Behavioral: Negative.        Physical Exam  BP 122/60 (BP Location: Left Arm, Patient Position: Sitting, Cuff Size: Normal)   Pulse 95   Ht 5\' 11"  (1.803 m)   Wt 184 lb 12.8 oz (83.8 kg)   SpO2 95%   BMI 25.77 kg/m   Wt Readings from Last 5 Encounters:  04/25/18 184 lb 12.8 oz (83.8 kg)  02/04/18 189 lb (85.7 kg)  01/20/18 184 lb 9.6 oz (83.7 kg)  12/29/17 183 lb 6.4 oz (83.2 kg)  11/25/17 185 lb 12.8 oz (84.3 kg)     Physical Exam  Constitutional: He is oriented to person, place, and time. He appears well-developed and well-nourished. No distress.    Cardiovascular: Normal rate and regular rhythm.  Pulmonary/Chest: Effort normal and breath sounds normal.  Congested cough noted.   Neurological: He is alert and oriented to person, place, and time.  Skin: Skin is warm and dry.  Psychiatric: He has a normal mood and affect.  Nursing note and vitals reviewed.    Lab Results:  CBC    Component Value Date/Time   WBC 10.1 09/28/2017 1239   RBC 3.32 (L) 09/28/2017 1239   HGB 9.2 (L) 09/28/2017 1239   HGB 13.0 07/27/2017 1020   HCT 28.6 (L) 09/28/2017 1239   HCT 38.3 07/27/2017 1020   PLT 324.0 09/28/2017 1239   PLT 269 07/27/2017 1020   MCV 86.3 09/28/2017 1239   MCV 87 07/27/2017 1020   MCH 28.6 08/31/2017 0429   MCHC 32.0 09/28/2017 1239   RDW 16.1 (H) 09/28/2017 1239   RDW 14.7 07/27/2017 1020   LYMPHSABS 1.3 09/28/2017 1239   MONOABS 0.6 09/28/2017 1239   EOSABS 0.2 09/28/2017 1239   BASOSABS 0.0 09/28/2017 1239    BMET    Component Value Date/Time   NA 142 02/04/2018 0926   K 4.1 02/04/2018 0926   CL 105 02/04/2018 0926   CO2 23 02/04/2018 0926   GLUCOSE 105 (H) 02/04/2018 0926   GLUCOSE 134 (H) 09/02/2017 0602   BUN 24 02/04/2018 0926   CREATININE 1.15 02/04/2018 0926   CALCIUM 8.7 02/04/2018 0926   GFRNONAA 57 (L) 02/04/2018 0926   GFRAA 66 02/04/2018 0926    BNP No results found for: BNP  ProBNP    Component Value Date/Time   PROBNP 262.1 12/27/2013 1300    Imaging: Ct Chest Wo Contrast  Result Date: 04/20/2018 CLINICAL DATA:  Three-month follow-up pulmonary nodules EXAM: CT CHEST WITHOUT CONTRAST TECHNIQUE: Multidetector CT imaging of the chest was performed following the standard protocol without IV contrast. COMPARISON:  01/20/2018 FINDINGS: Cardiovascular: The heart is normal in size. Stable loculated pericardial fluid along the posterior left heart border with mass effect on the left ventricle (series 2/image 89), unchanged. No evidence of thoracic aortic aneurysm. Atherosclerotic calcifications  of the aortic arch. Three vessel coronary atherosclerosis. Mediastinum/Nodes: Small mediastinal lymph nodes, including dominant 9 mm short axis prevascular nodes, grossly unchanged. Visualized thyroid is unremarkable. Lungs/Pleura: 14 mm ground-glass nodule inferiorly in the right upper lobe (series 3/image 32), unchanged.  Near complete resolution of prior clustered solid nodularity in the posterior left upper lobe, now very faint/ground-glass (series 3/image 43). Additional faint ground-glass opacity in the left upper lobe is less conspicuous on the current study (series 3/image 52). There are several new solid pulmonary opacities, as follows: --2.2 x 3.7 cm irregular/spiculated solid mass in the anterior right upper lobe (series 3/image 34) --15 mm irregular solid nodule in the posterior right upper lobe (series 3/image 49) --18 mm irregular solid nodule in the inferior left upper lobe/lingula (series 3/image 76) In the appropriate clinical setting, this appearance may reflect multifocal pneumonia. In the absence of infectious symptoms, tumor is not excluded, although such rapid progression is atypical. Underlying mild centrilobular and paraseptal emphysematous changes. Mild subpleural reticulation at the lung bases. No pleural effusion or pneumothorax. Upper Abdomen: Visualized upper abdomen is grossly unremarkable. Musculoskeletal: Degenerative changes of the visualized thoracolumbar spine. Median sternotomy. IMPRESSION: New 2.2 x 3.7 cm irregular/spiculated solid mass in the anterior right upper lobe. New irregular solid nodules in the bilateral upper lobes measuring 15-18 mm. In the appropriate clinical setting, this appearance may reflect multifocal pneumonia. In the absence of infectious symptoms, tumor is not excluded, although such rapid progression is atypical. Consider short-term follow-up CT chest or PET in 4-6 weeks after appropriate antimicrobial therapy. These results will be called to the ordering  clinician or representative by the Radiologist Assistant, and communication documented in the PACS or zVision Dashboard. Aortic Atherosclerosis (ICD10-I70.0) and Emphysema (ICD10-J43.9). Electronically Signed   By: Julian Hy M.D.   On: 04/20/2018 10:46     Assessment & Plan:   CAP (community acquired pneumonia) Will order doxycycline Deep breathing exercises discussed Please call with any worsening symptoms  COPD (chronic obstructive pulmonary disease) ([place]) Continue current medications Please keep follow up with Dr. Lake Bells  Keep taking Memory Dance daily Keep taking Singulair Keep using hypertonic saline before bedtime Keep using albuterol as needed for chest tightness wheezing or shortness of breath   NOTE: Patient has appointment already scheduled to discuss recent CT scan with patient on 05-04-18 with Dr. Lake Bells.   Considering impression from CT scan and current symptoms - will give doxycycline.   Will keep follow up with Dr. Lake Bells to reassess need for dupilumab and per CT recommendations reassess need for follow up CT vs PET scan.    Fenton Foy, NP 04/25/2018

## 2018-04-25 NOTE — Assessment & Plan Note (Signed)
Continue current medications Please keep follow up with Dr. Lake Bells  Keep taking Memory Dance daily Keep taking Singulair Keep using hypertonic saline before bedtime Keep using albuterol as needed for chest tightness wheezing or shortness of breath

## 2018-04-25 NOTE — Assessment & Plan Note (Signed)
Will order doxycycline Deep breathing exercises discussed Please call with any worsening symptoms

## 2018-04-27 IMAGING — DX DG CHEST 2V
2 series · 2 of 2 positions shown · non-contrast
Comparison: 09/28/2017.  08/31/2017

CLINICAL DATA: Chest tenderness and history of CABG.

EXAM:
CHEST  2 VIEW

[dg chest 2 view (1 of 2)]
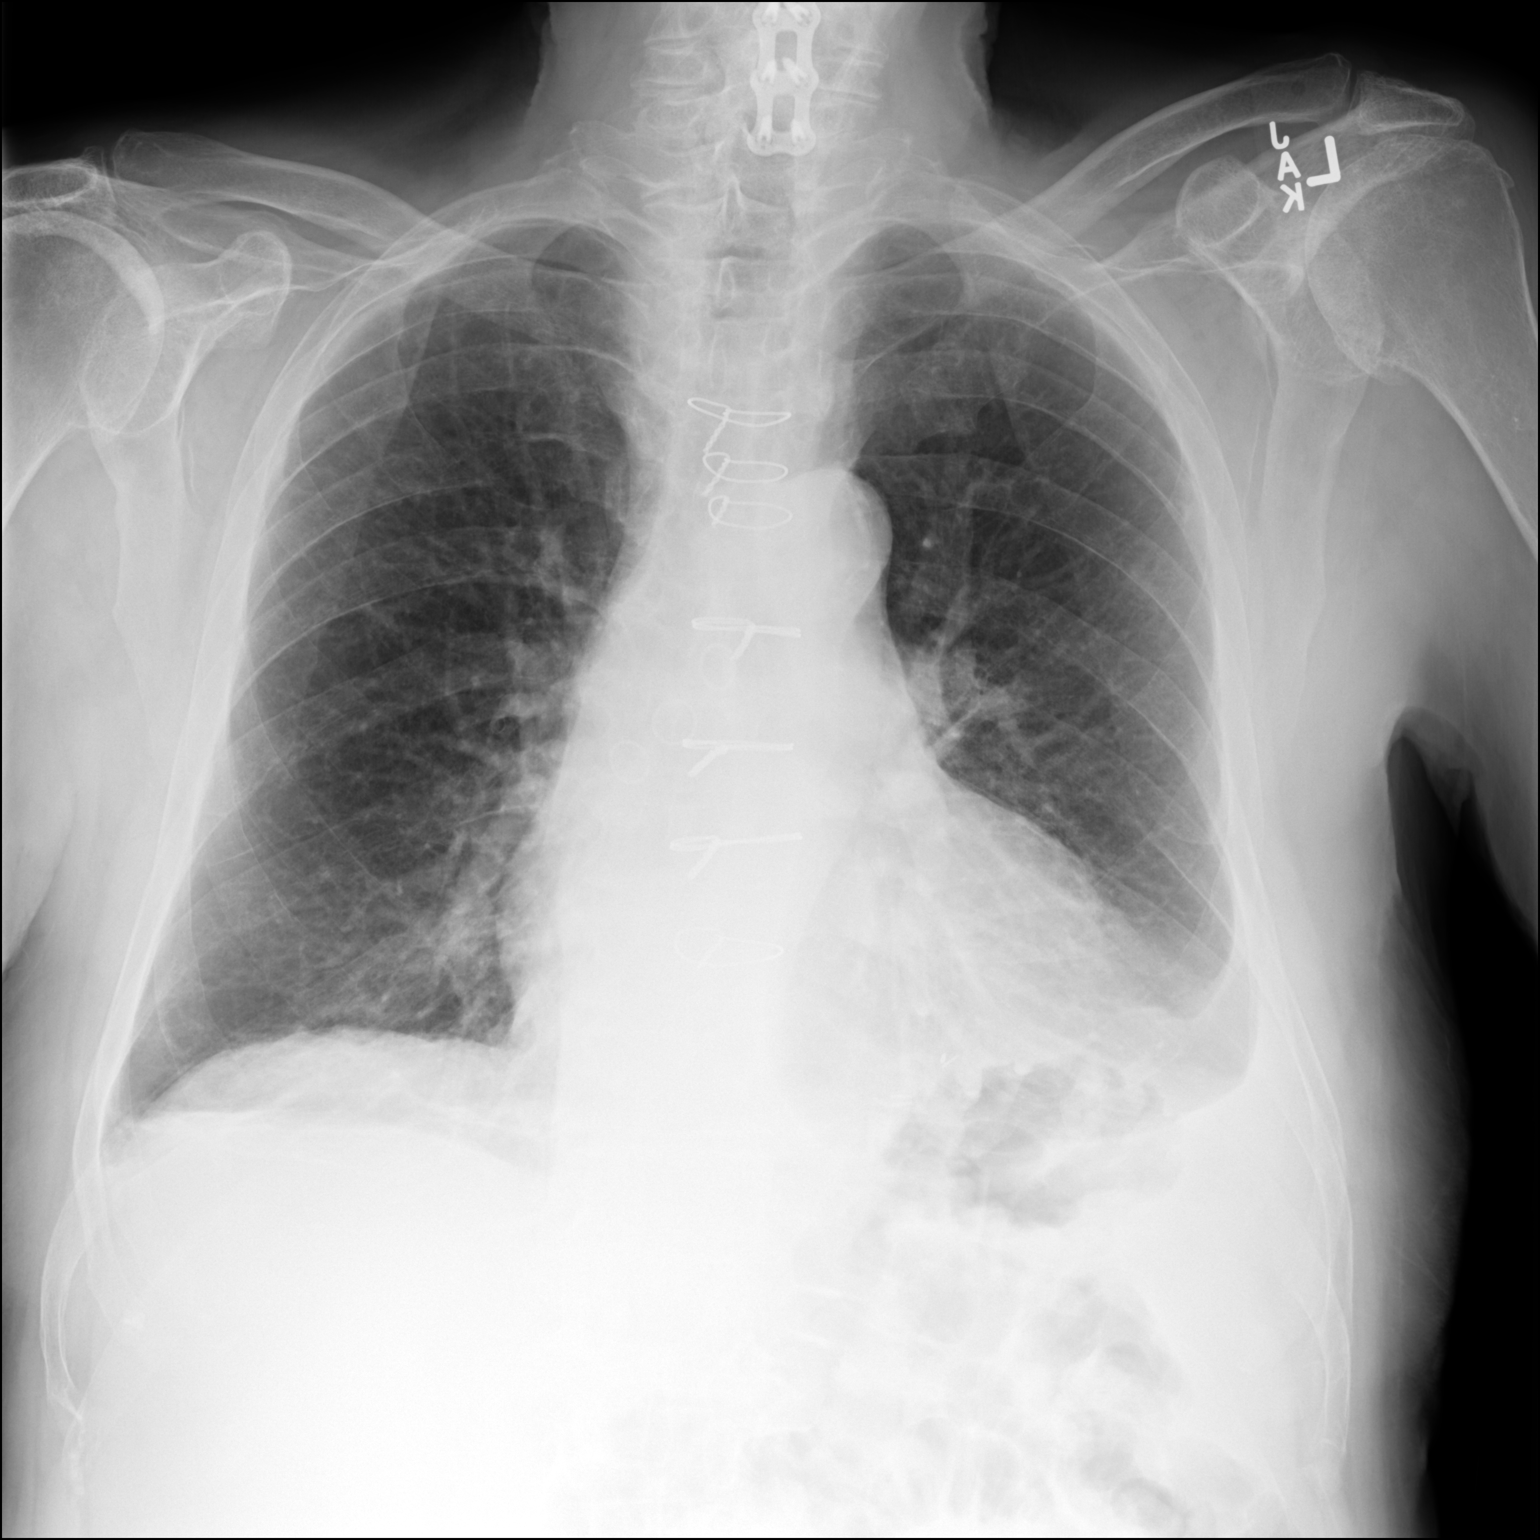

[dg chest 2 view (2 of 2)]
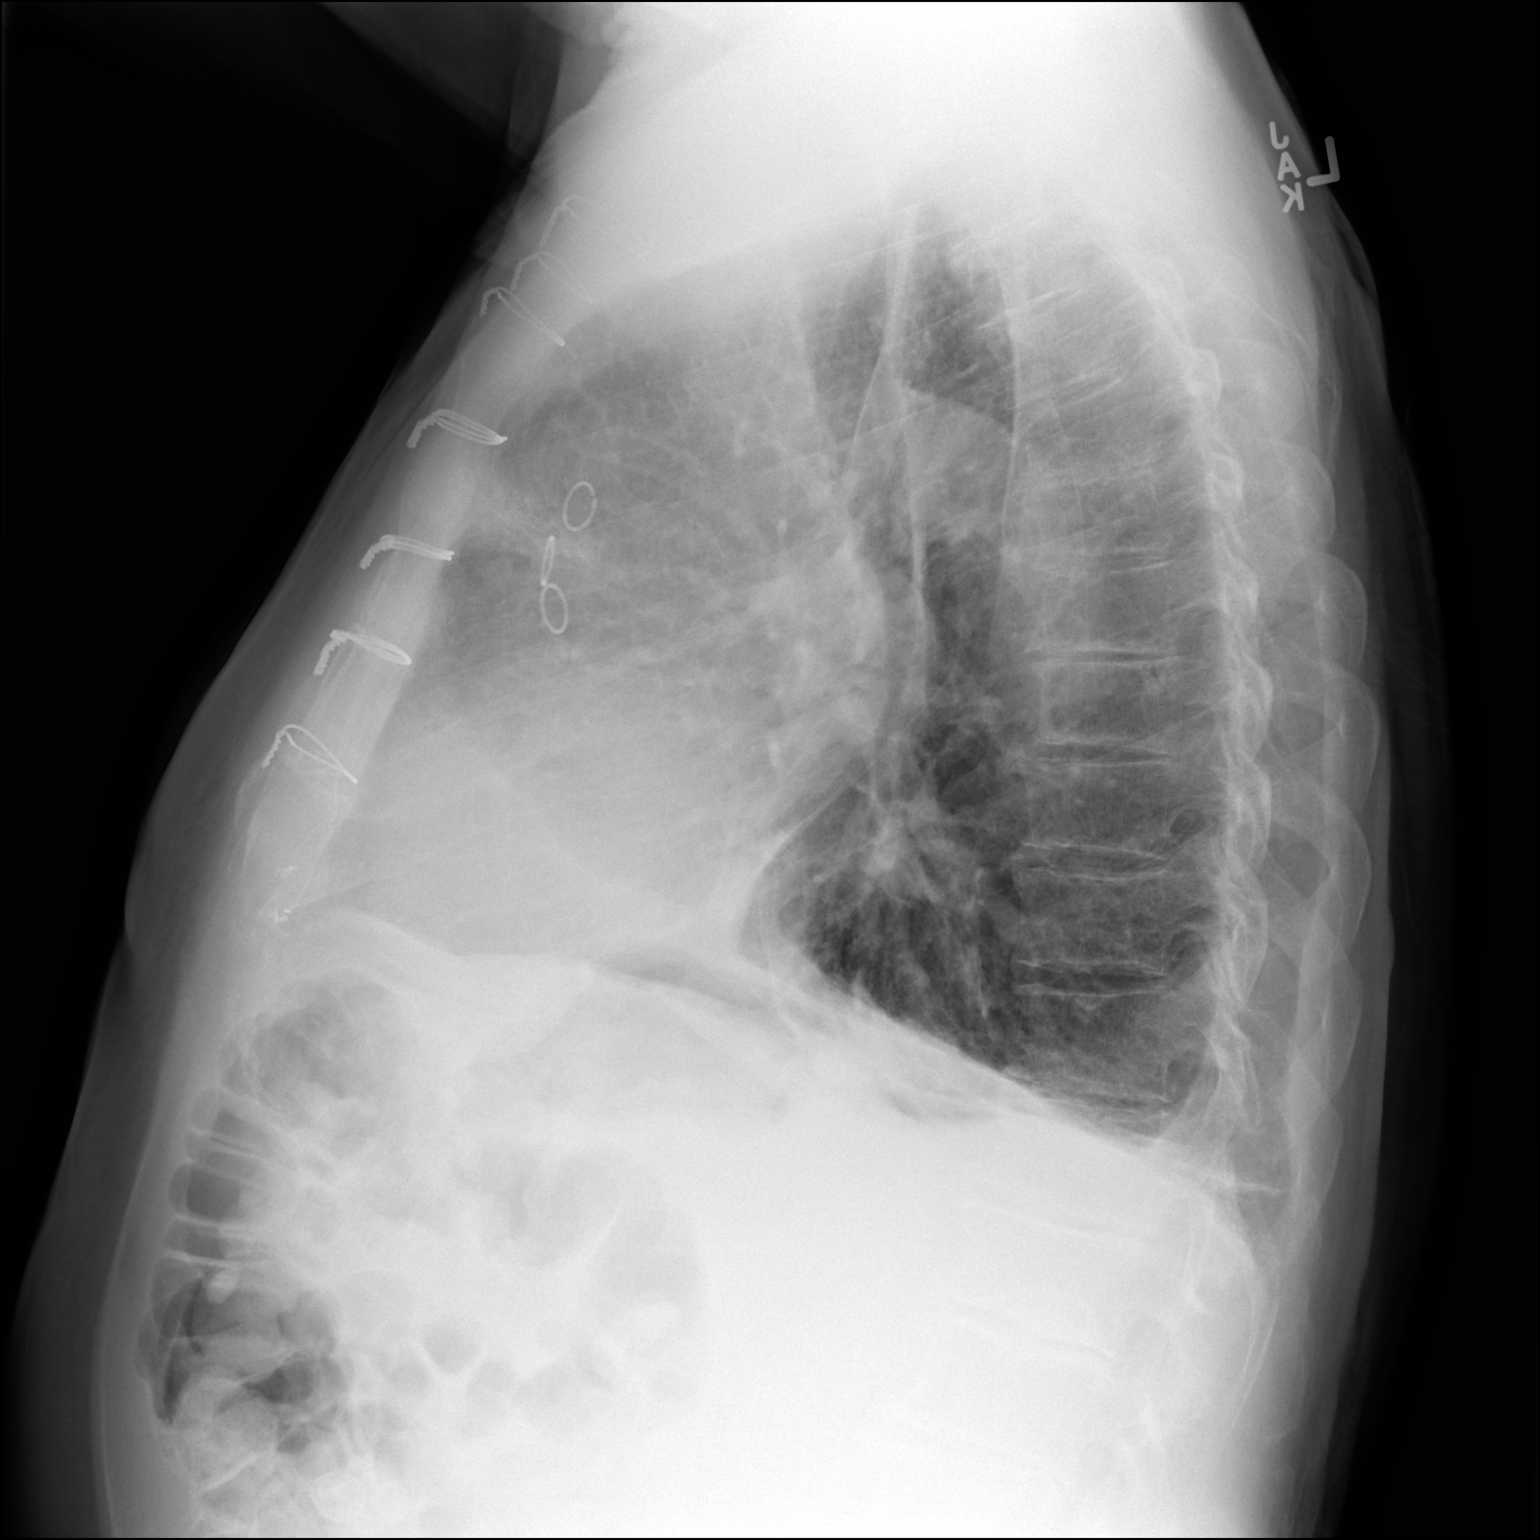

[2 of 2 positions shown; findings below may reference images not displayed]

FINDINGS: Heart size is within normal limits and stable. Post CABG changes.
Stable blunting of both costophrenic angles, left side is larger
than right. These findings appear chronic and favor pleural
thickening versus effusions. No pulmonary edema. No focal airspace
disease. Surgical plate and screws in the lower cervical spine.
IMPRESSION: No acute cardiopulmonary disease.

Stable blunting of both costophrenic angles. These findings are not
significantly changed and likely related to pleural thickening.
Difficult to exclude small effusions. No pulmonary edema.

## 2018-05-03 NOTE — Progress Notes (Signed)
Reviewed, agree 

## 2018-05-04 ENCOUNTER — Ambulatory Visit (INDEPENDENT_AMBULATORY_CARE_PROVIDER_SITE_OTHER): Payer: Medicare Other | Admitting: Pulmonary Disease

## 2018-05-04 ENCOUNTER — Encounter: Payer: Self-pay | Admitting: Pulmonary Disease

## 2018-05-04 VITALS — BP 132/60 | HR 101 | Ht 69.88 in | Wt 182.0 lb

## 2018-05-04 DIAGNOSIS — J181 Lobar pneumonia, unspecified organism: Secondary | ICD-10-CM | POA: Diagnosis not present

## 2018-05-04 DIAGNOSIS — R918 Other nonspecific abnormal finding of lung field: Secondary | ICD-10-CM | POA: Diagnosis not present

## 2018-05-04 DIAGNOSIS — R1310 Dysphagia, unspecified: Secondary | ICD-10-CM | POA: Diagnosis not present

## 2018-05-04 MED ORDER — LEVALBUTEROL HCL 0.63 MG/3ML IN NEBU: 0.6300 mg | INHALATION_SOLUTION | RESPIRATORY_TRACT | 5 refills | Status: DC | PRN

## 2018-05-04 MED ORDER — FLUTICASONE FUROATE-VILANTEROL 100-25 MCG/INH IN AEPB: 1.0000 | INHALATION_SPRAY | Freq: Every day | RESPIRATORY_TRACT | 11 refills | Status: DC

## 2018-05-04 NOTE — Progress Notes (Signed)
Subjective:    Patient ID: Tim [name], male    DOB: [birth year], 84 y.o.   MRN: [id number]  Synopsis: Former patient of Dr. Gwenette Greet with COPD and a paralyzed R Hemidiaphragm as seen on fluoroscopy in 2007. Lung function testing in 2005 showed clear airflow obstruction, FEV1 of 1.53 L (43% predicted), total lung capacity 62% predicted, DLCO 73% predicted. He smoked 2-3 packs per day for 40 years, quit around 1980.  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   Had CABG by Dr. Servando Snare in 4098 complicated by a pericardial effusion.   HPI Chief Complaint  Patient presents with  . Follow-up   He came last week and was prescribed doxycycline for green mucus production and dyspnea.  He says that the mucus is clear, no longer green.  No fever or chills.  He  is still producing a lot of mucus.  He is wheezing a lot.  He says this is worse.  He is still short of breath.   He started repatha this year and he felt terribly after starting it> felt like he had the flu.    He notes that he has been struggling with trouble swallowing lately.  He says the pills hang up nearly every day in his throat.  Sometimes food will get stuck as well.  This is been a problem ever since his coronary artery bypass graft.  He continues to take [place] daily.  He says that every time he takes albuterol he feels that his heart is racing and he feels lightheaded.  Past Medical History:  Diagnosis Date  . AAA (abdominal aortic aneurysm) (HCC)    3.2 cm 07/2016; 2-3 year f/u recommended  . Asthma   . BPH (benign prostatic hypertrophy)   . CAD (coronary artery disease)    9 STEMI 2001, PCI, RCA, residual 100% circumflex  /   PCI for in-stent restenosis June, 2001  /  nuclear February, 2010, no ischemia  . Cancer ([place])    skin cancer removed  . Carotid artery disease ([place])    Doppler, June, 2011, 0-39% bilateral, pt denies  . Cervical disc disease    Status post neurosurgery  . Colon  polyps   . Ejection fraction    EF 55-65%, echo, 2009  . Elevated CPK    Chronic mild CPK elevation  . Emphysema    Mild  . Fatigue    Morning fatigue, August, 2011  . GERD (gastroesophageal reflux disease)   . Headache    back of head  . Hyperlipidemia   . Hypertension   . Low back pain    February, 2013  . Myocardial infarction ([place])   . Paralyzed hemidiaphragm    Right  . Paralyzed hemidiaphragm    Chronic  . Statin intolerance    Elevated CPK in the past      Review of Systems  Constitutional: Negative for fatigue and fever.  HENT: Negative for postnasal drip and rhinorrhea.   Respiratory: Positive for cough. Negative for shortness of breath and wheezing.   Cardiovascular: Negative for chest pain and leg swelling.       Objective:   Physical Exam Vitals:   05/04/18 0910  BP: 132/60  Pulse: (!) 101  SpO2: 96%  Weight: 182 lb (82.6 kg)  Height: 5' 9.88" (1.775 m)   RA  Gen: well appearing HENT: OP clear, TM's clear, neck supple PULM: CTA B, normal percussion CV: RRR, no mgr,  trace edema GI: BS+, soft, nontender Derm: no cyanosis or rash Psyche: normal mood and affect    Chest imaging 01/2016 CXR > granuloma left upper lobe November 2018 CT chest: Less than 4 mm solid pleural-based right upper lobe nodule noted, groundglass nodule 11.9 mm right upper lobe, centrilobular emphysema noted, nonspecific scarring in the bases.  Images independently reviewed by me  Labs: CBC    Component Value Date/Time   WBC 10.1 09/28/2017 1239   RBC 3.32 (L) 09/28/2017 1239   HGB 9.2 (L) 09/28/2017 1239   HGB 13.0 07/27/2017 1020   HCT 28.6 (L) 09/28/2017 1239   HCT 38.3 07/27/2017 1020   PLT 324.0 09/28/2017 1239   PLT 269 07/27/2017 1020   MCV 86.3 09/28/2017 1239   MCV 87 07/27/2017 1020   MCH 28.6 08/31/2017 0429   MCHC 32.0 09/28/2017 1239   RDW 16.1 (H) 09/28/2017 1239   RDW 14.7 07/27/2017 1020   LYMPHSABS 1.3 09/28/2017 1239   MONOABS 0.6 09/28/2017  1239   EOSABS 0.2 09/28/2017 1239   BASOSABS 0.0 09/28/2017 1239   Labs: 08/2017 IgE normal January 2019 IgE normal  Exhaled nitric oxide test: January 2018 22 ppm  Chest imaging: November 10, 2017 CT chest images independently reviewed showing mild to moderate centrilobular emphysema, atelectasis in the bases, left upper lobe nodule 12 mm in size new from prior, stable right lung groundglass nodule is dating back to 2008, moderate to large loculated pericardial effusion noted 04/2018 CT chest > new RUL spiculated nodule, 2.2 x 3.7 cm bilateral uper lobe nodules, emphysema, images independently reviewed  Records from his visit here earlier in the month noted where he was treated for pneumonia     Assessment & Plan:   No diagnosis found.  Discussion: I believe that the findings on the CT scan of his chest from earlier this month were due to pneumonia given his significant symptoms.  These have improved somewhat but not unexpectedly he still has the signs and symptoms one would expect after having pneumonia.  Specifically still has some chest congestion and mucus production.  His lungs are clear to auscultation today so I do not believe he needs to have prednisone.  He reveals to me today that he is struggling with dysphasia so I wonder if this has something to do with his recurrent exacerbations of respiratory infection.  Plan: Dysphagia: We will check a barium swallow test to see if you have a stricture in your esophagus.  If there is something like this then we would need to refer you to gastroenterology.  COPD-asthma overlap syndrome: Continue Breo daily Continue hypertonic saline twice a day We will change albuterol to Xopenex given the significant lightheadedness and heart rate issues you have been experiencing with albuterol, continue to use Xopenex twice a day or as needed for chest tightness wheezing or shortness of breath  Recent community-acquired pneumonia with abnormal  CT scan of the chest: Take Mucinex twice a day to help clear up the mucus Let us know if you have worsening fever or shortness of breath or change in mucus color We will repeat a CT scan of the chest in October 2019 and see you after that  Follow-up in October    Current Outpatient Medications:  .  albuterol (PROVENTIL HFA;VENTOLIN HFA) 108 (90 BASE) MCG/ACT inhaler, Inhale 2 puffs into the lungs every 6 (six) hours as needed for wheezing or shortness of breath., Disp: 1 Inhaler, Rfl: 6 .  albuterol (PROVENTIL) (2.5  MG/3ML) 0.083% nebulizer solution, Take 3 mLs (2.5 mg total) by nebulization every 6 (six) hours as needed for wheezing or shortness of breath., Disp: 75 mL, Rfl: 12 .  aspirin EC 81 MG tablet, Take 81 mg by mouth daily., Disp: , Rfl:  .  BREO ELLIPTA 100-25 MCG/INH AEPB, INHALE 1 PUFF DAILY INTO THE LUNGS., Disp: 60 each, Rfl: 5 .  doxycycline (VIBRA-TABS) 100 MG tablet, Take 1 tablet (100 mg total) by mouth 2 (two) times daily for 10 days., Disp: 20 tablet, Rfl: 0 .  finasteride (PROSCAR) 5 MG tablet, Take 5 mg by mouth 2 (two) times daily. , Disp: , Rfl:  .  IRON PO, Take by mouth as directed., Disp: , Rfl:  .  montelukast (SINGULAIR) 10 MG tablet, Take 10 mg by mouth at bedtime., Disp: , Rfl:  .  Multiple Vitamins-Minerals (ICAPS PO), Take 1 capsule by mouth daily., Disp: , Rfl:  .  omeprazole (PRILOSEC) 40 MG capsule, TAKE 1 CAPSULE BY MOUTH TWICE A DAY, Disp: 180 capsule, Rfl: 1 .  potassium chloride SA (K-DUR,KLOR-CON) 20 MEQ tablet, Take 1 tablet (20 mEq total) by mouth daily., Disp: 90 tablet, Rfl: 3 .  sodium chloride HYPERTONIC 3 % nebulizer solution, Take by nebulization 2 (two) times daily., Disp: 300 mL, Rfl: 11 .  tamsulosin (FLOMAX) 0.4 MG CAPS capsule, Take 0.4 mg by mouth 2 (two) times daily. For urinary symptoms , Disp: , Rfl:  .  furosemide (LASIX) 40 MG tablet, Take 1 tablet (40 mg total) by mouth daily., Disp: 90 tablet, Rfl: 3

## 2018-05-04 NOTE — Patient Instructions (Signed)
Dysphagia: We will check a barium swallow test to see if you have a stricture in your esophagus.  If there is something like this then we would need to refer you to gastroenterology.  COPD-asthma overlap syndrome: Continue Breo daily Continue hypertonic saline twice a day We will change albuterol to Xopenex given the significant lightheadedness and heart rate issues you have been experiencing with albuterol, continue to use Xopenex twice a day or as needed for chest tightness wheezing or shortness of breath  Recent community-acquired pneumonia with abnormal CT scan of the chest: Take Mucinex twice a day to help clear up the mucus Let us know if you have worsening fever or shortness of breath or change in mucus color We will repeat a CT scan of the chest in October 2019 and see you after that  Follow-up in October

## 2018-05-09 ENCOUNTER — Ambulatory Visit: Payer: Medicare Other | Admitting: Cardiology

## 2018-05-09 ENCOUNTER — Ambulatory Visit (HOSPITAL_COMMUNITY): Payer: Medicare Other

## 2018-05-09 ENCOUNTER — Ambulatory Visit (HOSPITAL_COMMUNITY)
Admission: RE | Admit: 2018-05-09 | Discharge: 2018-05-09 | Disposition: A | Discharge status: Home / Self Care | Payer: Medicare Other | Source: Ambulatory Visit | Attending: Pulmonary Disease | Admitting: Pulmonary Disease

## 2018-05-09 ENCOUNTER — Other Ambulatory Visit (HOSPITAL_COMMUNITY): Payer: Self-pay | Admitting: Pulmonary Disease

## 2018-05-09 ENCOUNTER — Other Ambulatory Visit: Payer: Self-pay | Admitting: Pulmonary Disease

## 2018-05-09 ENCOUNTER — Encounter: Payer: Self-pay | Admitting: Cardiology

## 2018-05-09 ENCOUNTER — Telehealth: Payer: Self-pay | Admitting: Pulmonary Disease

## 2018-05-09 VITALS — BP 120/70 | HR 100 | Ht 69.88 in | Wt 181.4 lb

## 2018-05-09 DIAGNOSIS — I313 Pericardial effusion (noninflammatory): Secondary | ICD-10-CM | POA: Diagnosis not present

## 2018-05-09 DIAGNOSIS — R1319 Other dysphagia: Secondary | ICD-10-CM

## 2018-05-09 DIAGNOSIS — Z79899 Other long term (current) drug therapy: Secondary | ICD-10-CM | POA: Diagnosis not present

## 2018-05-09 DIAGNOSIS — R1310 Dysphagia, unspecified: Secondary | ICD-10-CM

## 2018-05-09 DIAGNOSIS — Z951 Presence of aortocoronary bypass graft: Secondary | ICD-10-CM | POA: Diagnosis not present

## 2018-05-09 DIAGNOSIS — E7800 Pure hypercholesterolemia, unspecified: Secondary | ICD-10-CM

## 2018-05-09 DIAGNOSIS — I3139 Other pericardial effusion (noninflammatory): Secondary | ICD-10-CM

## 2018-05-09 DIAGNOSIS — I2510 Atherosclerotic heart disease of native coronary artery without angina pectoris: Secondary | ICD-10-CM

## 2018-05-09 DIAGNOSIS — Z789 Other specified health status: Secondary | ICD-10-CM

## 2018-05-09 LAB — LIPID PANEL
Chol/HDL Ratio: 4 ratio (ref 0.0–5.0)
Cholesterol, Total: 152 mg/dL (ref 100–199)
HDL: 38 mg/dL — ABNORMAL LOW (ref >39)
LDL Calculated: 98 mg/dL (ref 0–99)
Triglycerides: 82 mg/dL (ref 0–149)
VLDL Cholesterol Cal: 16 mg/dL (ref 5–40)

## 2018-05-09 LAB — ALT: ALT: 10 IU/L (ref 0–44)

## 2018-05-09 NOTE — Telephone Encounter (Signed)
Received call from [place] at [facility] - pt there for DG Esophagus but aspirated and speech pathologist was consulted > wants to do a modified barium swallow.  BQ not in office  Spoke with TP > okay to do modified barium swallow  Call with Silva Bandy dropped Called back and spoke with Corinne Ports who requested an [id number] be ordered for modified barium swallow. Order placed  Will sign and route to BQ to make him aware when he returns to the office  Nothing further needed at this time; will sign off

## 2018-05-09 NOTE — Progress Notes (Signed)
Cardiology Office Note    Date:  05/09/2018   ID:  Tim [name], DOB 1929/06/11, MRN [id number]  PCP:  Raelene Bott, MD  Cardiologist:   Candee Furbish, MD     History of Present Illness:  Tim [name] is a 84 y.o. male here for follow-up, former patient of Dr. Ron Parker with bilateral carotid artery disease,, coronary artery disease status post STEMI in 2001 with PCI to RCA, residual 100% circumflex, subsequent PCI for in-stent restenosis in June 2001, nuclear in 2014 with no scar, no ischemia 62%, with subsequent CABG by Dr. Servando Snare in 2018, left localized pericardial effusion chronic.[name] He has had trouble with statins in the past, elevated CPK in the past. Former smoker quit in 1980. Here for follow up.  COPD.  Prior neck surgery.  07/27/17-he was seen at [facility] with minimally elevated troponin, increased slightly.  This was thought to be secondary to elevated blood pressure and demand ischemia.  He was placed on a heparin shot.  He states.  His blood pressure was as high as 242 systolic.  This was unusual for him.  His troponin was 0.07.  Down trended.  EKG showed no changes.  It was recommended that he consider a nuclear stress test in the future, most recent was 2014 and was normal.  Finished a course of Z-Pak.  11/11/17 - Back in Nov of 2018 after his demand ischemia episode, he went for cath and this showed 3v disease, including sequential 70% and 50% proximal/mid LAD stenoses as well as chronic total occlusion of the proximal/mid LCx and distal RCA.,Moderate to severe in-stent restenosis in the mid/distal RCA. Pt then was evaluated by Dr. Servando Snare and pulmonary with Dr. Lake Bells for COPD eval for surgery. He has paralyzed Rt hemidiaphragm.   He underwent CABG X 4 08/24/17 with LIMA to LAD, VG to diagonal, VG to LCX, VG to PDA Discharged 09/03/17.   Complicated by PAF post op, amio. Pericarditis (T wave peaking on ECG). Wheezing on atenolol, changed to  metoprolol. Dizzy with this. Edema. Stopped taking it. Tingling in chest. No pain since surgery he says. He wants to get back to work, [employer].  05/09/18  - Not as much energy as he would like.  He took 2 injections of Repatha and he felt as though he was having the flu so he stopped.  He has not taken the third injection.  He would like for his cholesterol checked.  He also wakes up with cough in the morning.  He is having a test to evaluate his swallowing in the hospital today.  He still feels some musculoskeletal discomfort from his neck down to his stomach from bypass.  He had prior cervical spine fusion by Dr. Carloyn Manner several years ago, states that he believes that his right ear hearing is gone because of this.  Seems unusual.  Denies any anginal symptoms.  No fevers chills nausea vomiting syncope.   Past Medical History:  Diagnosis Date  . AAA (abdominal aortic aneurysm) (HCC)    3.2 cm 07/2016; 2-3 year f/u recommended  . Asthma   . BPH (benign prostatic hypertrophy)   . CAD (coronary artery disease)    9 STEMI 2001, PCI, RCA, residual 100% circumflex  /   PCI for in-stent restenosis June, 2001  /  nuclear February, 2010, no ischemia  . Cancer ([place])    skin cancer removed  . Carotid artery disease ([place])    Doppler, June, 2011, 0-39% bilateral, pt  denies  . Cervical disc disease    Status post neurosurgery  . Colon polyps   . Ejection fraction    EF 55-65%, echo, 2009  . Elevated CPK    Chronic mild CPK elevation  . Emphysema    Mild  . Fatigue    Morning fatigue, August, 2011  . GERD (gastroesophageal reflux disease)   . Headache    back of head  . Hyperlipidemia   . Hypertension   . Low back pain    February, 2013  . Myocardial infarction ([place])   . Paralyzed hemidiaphragm    Right  . Paralyzed hemidiaphragm    Chronic  . Statin intolerance    Elevated CPK in the past    Past Surgical History:  Procedure Laterality Date  . BREAST SURGERY     right breast removed  .  CERVICAL DISC SURGERY    . CORONARY ARTERY BYPASS GRAFT N/A 08/24/2017   Procedure: CORONARY ARTERY BYPASS GRAFTING times four using left internal mammary artery and bilateral saphenous vein, using endoscope. TEE;  Surgeon: Grace Isaac, MD;  Location: [place];  Service: Open Heart Surgery;  Laterality: N/A;  . LEFT HEART CATH AND CORONARY ANGIOGRAPHY N/A 07/29/2017   Procedure: LEFT HEART CATH AND CORONARY ANGIOGRAPHY;  Surgeon: Nelva Bush, MD;  Location: [facility];  Service: Cardiovascular;  Laterality: N/A;  . SKIN CANCER EXCISION    . TEE WITHOUT CARDIOVERSION N/A 08/24/2017   Procedure: TRANSESOPHAGEAL ECHOCARDIOGRAM (TEE);  Surgeon: Grace Isaac, MD;  Location: [facility];  Service: Open Heart Surgery;  Laterality: N/A;    Current Medications: Outpatient Medications Prior to Visit  Medication Sig Dispense Refill  . albuterol (PROVENTIL HFA;VENTOLIN HFA) 108 (90 BASE) MCG/ACT inhaler Inhale 2 puffs into the lungs every 6 (six) hours as needed for wheezing or shortness of breath. 1 Inhaler 6  . aspirin EC 81 MG tablet Take 81 mg by mouth daily.    . finasteride (PROSCAR) 5 MG tablet Take 5 mg by mouth 2 (two) times daily.     . fluticasone furoate-vilanterol (BREO ELLIPTA) 100-25 MCG/INH AEPB Inhale 1 puff into the lungs daily. 60 each 11  . IRON PO Take by mouth as directed.    . levalbuterol (XOPENEX) 0.63 MG/3ML nebulizer solution Take 3 mLs (0.63 mg total) by nebulization every 4 (four) hours as needed for wheezing or shortness of breath. 360 mL 5  . montelukast (SINGULAIR) 10 MG tablet Take 10 mg by mouth at bedtime.    . Multiple Vitamins-Minerals (ICAPS PO) Take 1 capsule by mouth daily.    [name] omeprazole (PRILOSEC) 40 MG capsule TAKE 1 CAPSULE BY MOUTH TWICE A DAY 180 capsule 1  . potassium chloride SA (K-DUR,KLOR-CON) 20 MEQ tablet Take 1 tablet (20 mEq total) by mouth daily. 90 tablet 3  . sodium chloride HYPERTONIC 3 % nebulizer solution Take by nebulization 2  (two) times daily. 300 mL 11  . tamsulosin (FLOMAX) 0.4 MG CAPS capsule Take 0.4 mg by mouth 2 (two) times daily. For urinary symptoms     . furosemide (LASIX) 40 MG tablet Take 1 tablet (40 mg total) by mouth daily. 90 tablet 3   No facility-administered medications prior to visit.      Allergies:   Statins and Repatha [evolocumab]   Social History   Socioeconomic History  . Marital status: Married    Spouse name: Not on file  . Number of children: Not on file  . Years of  education: Not on file  . Highest education level: Not on file  Occupational History  . Occupation: retired Animal nutritionist  . Financial resource strain: Not on file  . Food insecurity:    Worry: Not on file    Inability: Not on file  . Transportation needs:    Medical: Not on file    Non-medical: Not on file  Tobacco Use  . Smoking status: Former Smoker    Packs/day: 3.00    Years: 40.00    Pack years: 120.00    Types: Cigarettes    Last attempt to quit: 09/15/1975    Years since quitting: 42.6  . Smokeless tobacco: Never Used  Substance and Sexual Activity  . Alcohol use: No    Frequency: Never  . Drug use: No  . Sexual activity: Not on file  Lifestyle  . Physical activity:    Days per week: Not on file    Minutes per session: Not on file  . Stress: Not on file  Relationships  . Social connections:    Talks on phone: Not on file    Gets together: Not on file    Attends religious service: Not on file    Active member of club or organization: Not on file    Attends meetings of clubs or organizations: Not on file    Relationship status: Not on file  Other Topics Concern  . Not on file  Social History Narrative  . Not on file     Family History:  The patient's family history includes Cancer in his brother; Diabetes in his mother and sister; Kidney disease in his brother; Other in his father and sister.   ROS:   Please see the history of present illness.    Review of Systems  All  other systems reviewed and are negative.     PHYSICAL EXAM:   VS:  BP 120/70   Pulse 100   Ht 5' 9.88" (1.775 m)   Wt 181 lb 6.4 oz (82.3 kg)   SpO2 97%   BMI 26.12 kg/m     GEN: Well nourished, well developed, in no acute distress  HEENT: normal  Neck: no JVD, carotid bruits, or masses Cardiac: Tachy reg; no murmurs, rubs, or gallops,no edema  Respiratory: poor air movement bilaterally, normal work of breathing GI: soft, nontender, nondistended, + BS MS: no deformity or atrophy  Skin: warm and dry, no rash Neuro:  Alert and Oriented x 3, Strength and sensation are intact Psych: euthymic mood, full affect     Wt Readings from Last 3 Encounters:  05/09/18 181 lb 6.4 oz (82.3 kg)  05/04/18 182 lb (82.6 kg)  04/25/18 184 lb 12.8 oz (83.8 kg)      Studies/Labs Reviewed:   EKG:   07/27/17-sinus rhythm specific ST-T wave changes, mild peaking of T waves in V2.  Personally viewed-prior sinus rhythm, 65, J-point elevation precordial leads, no significant change from prior EKG. Personally viewed.  Recent Labs: 08/20/2017: ALT 18 08/25/2017: Magnesium 2.5 08/31/2017: TSH 2.942 09/28/2017: Hemoglobin 9.2; Platelets 324.0 02/04/2018: BUN 24; Creatinine, Ser 1.15; Potassium 4.1; Sodium 142   Lipid Panel No results found for: CHOL, TRIG, HDL, CHOLHDL, VLDL, LDLCALC, LDLDIRECT  Additional studies/ records that were reviewed today include:  Prior office notes reviewed, lab work reviewed  Cardiac cath 07/29/17 .Conclusion   Conclusions: 1. Significant three-vessel coronary artery disease, including sequential 70% and 50% proximal/mid LAD stenoses as well as chronic total occlusion of the  proximal/mid LCx and distal RCA. 2. Moderate to severe in-stent restenosis in the mid/distal RCA. 3. Normal left ventricular contraction and filling pressure.  Recommendations: 1. Cardiac surgery consultation, given significant three vessel coronary artery disease. OM3 and rPDA are supplied  by collaterals. 2. If patient is not a surgical candidate, PCI to proximal/mid LAD could be considered, with continued medical management of chronic total occlusions of the LCx and RCA. 3. Aggressive secondary prevention. Consider retrial of statin versus evaluation for PCSK9 inhibitor therapy. 4. Continue atenolol and isosorbide mononitrate, to be uptitrated as tolerated.     ECHO 08/20/17 Study Conclusions  - Left ventricle: The cavity size was normal. Systolic function was normal. The estimated ejection fraction was in the range of 55% to 60%. Probable hypokinesis of the basal-midinferolateral and inferior myocardium; consistent with ischemia in the distribution of the right coronary or left circumflex coronary artery. Doppler parameters are consistent with abnormal left ventricular relaxation (grade 1 diastolic dysfunction). - Left atrium: The atrium was mildly dilated.  ECHO 11/11/17: - Left ventricle: The cavity size was normal. Wall thickness was   normal. Systolic function was normal. The estimated ejection   fraction was in the range of 60% to 65%. Doppler parameters are   consistent with both elevated ventricular end-diastolic filling   pressure and elevated left atrial filling pressure. - Left atrium: The atrium was mildly dilated. - Atrial septum: No defect or patent foramen ovale was identified. - Pericardium, extracardiac: Moderate localized posterior lateral   pericardial effusion no tamponade.  Vas dopplers 08/20/17 Final Interpretation: Right Carotid: There is evidence in the right ICA of a 1-39% stenosis.  Left Carotid: There is evidence in the left ICA of a 40-59% stenosis. Vertebrals: Both vertebral arteries were patent with antegrade flow. Subclavians:  Right ABI: Resting right ankle-brachial index is within normal range. No evidence of significant right lower extremity arterial disease. Left ABI: Resting left ankle-brachial index indicates  mild right lower extremity arterial disease. Right Upper Extremity: No significant arterial obstruction detected in the right upper extremity. Doppler waveform obliterate with compression. Doppler waveforms remain within normal limits with compression. Left Upper Extremity: No significant arterial obstruction detected in the left upper extremity. Doppler waveform obliterate with compression. Doppler waveforms decrease <50% w/compression.   CABG 08/24/17 CORONARY ARTERY BYPASS GRAFTINGx 4 (LIMA to LAD, SVG to DIAGONAL, SVG to CIRCUMFLEX, SVG to PDA)using left internal mammary artery and bilateral greatersaphenous veinvia EVH       ASSESSMENT:    1. Pure hypercholesterolemia   2. Long-term use of high-risk medication   3. S/P CABG x 4   4. Pericardial effusion   5. CAD, multiple vessel   6. Statin intolerance      PLAN:  In order of problems listed above:  Coronary artery disease post  CABG X 4 LIMA to LAD, SVG to DIAGONAL, SVG to CIRCUMFLEX, SVG to PDA)using left internal mammary artery and bilateral greatersaphenous veinvia EVH.  - History of STEMI in 2001, right coronary artery stent placement.  - Feeling better he states.  Main complaint is continued mucus production from his COPD/bronchitis.  Still coughing.  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  Paroxysmal atrial fibrillation  - amio stopped post op because of nausea.   - Dilt cd 120. Dizzy with metoprolol, wheeze with atenolol.   - Not on anticoagulation. Last ECG sinus rhythm.  Now currently in sinus rhythm.  If atrial fibrillation were to  return, we would strongly recommend anticoagulation for stroke prevention.  COPD  - Pulmonary medicine, Dr. Lake Bells he is seeing, PPI, singulair. Increased mucus.  Getting a swallowing evaluation.  Carotid artery disease  - Moderate, bilateral, watching with carotid Dopplers.  - Carotid duplex on 07/24/16 showed moderate disease  50-69% bilaterally  Statin intolerance/hyperlipidemia  - Unable to take statins, did not wish to try Zetia, felt flulike symptoms after 2 injections with Repatha. Dr. Ron Parker tried several he says.  Unfortunately it seems as though we have exhausted typical pharmacologic therapy for him to achieve goal LDL less than 70.  Ultimately, it would be nice for him to be on 1 of these types of medications to reduce his overall morbidity/mortality risk.  He understands.  Mildly dilated abdominal aorta/aortic atherosclerosis  - 3.2 cm, minimal. Should be of no clinical consequence. Iliac artery showed mild calcified plaque. Mild aortic atherosclerosis seen.  Pericardial effusion  - noted on pulm CT scan. Reported as large along LV 8cm. ECHO.  Confirmed a localized effusion, stable, no signs of tamponade.  Dr. Servando Snare aware.  Appears chronic now.  [name] in 6 months, me 12.   Medication Adjustments/Labs and Tests Ordered: Current medicines are reviewed at length with the patient today.  Concerns regarding medicines are outlined above.  Medication changes, Labs and Tests ordered today are listed in the Patient Instructions below. Patient Instructions  Medication Instructions:  The current medical regimen is effective;  continue present plan and medications.  Labwork: Please have blood work today (Lipid/ALT)  Follow-Up: Follow up in 6 months with Cecilie Kicks, NP.  You will receive a letter in the mail 2 months before you are due.  Please call us when you receive this letter to schedule your follow up appointment.  Follow up in 1 year with Dr. Marlou Porch.  You will receive a letter in the mail 2 months before you are due.  Please call us when you receive this letter to schedule your follow up appointment.  If you need a refill on your cardiac medications before your next appointment, please call your pharmacy.  Thank you for choosing [facility]!!        Signed, Candee Furbish, MD  05/09/2018  9:07 AM    [facility] [address], [place], [place]  [address] Phone: [phone]; Fax: [phone]

## 2018-05-09 NOTE — Patient Instructions (Signed)
Medication Instructions:  The current medical regimen is effective;  continue present plan and medications.  Labwork: Please have blood work today (Lipid/ALT)  Follow-Up: Follow up in 6 months with Cecilie Kicks, NP.  You will receive a letter in the mail 2 months before you are due.  Please call us when you receive this letter to schedule your follow up appointment.  Follow up in 1 year with Dr. Marlou Porch.  You will receive a letter in the mail 2 months before you are due.  Please call us when you receive this letter to schedule your follow up appointment.  If you need a refill on your cardiac medications before your next appointment, please call your pharmacy.  Thank you for choosing [facility]!!

## 2018-05-09 NOTE — Therapy (Signed)
Modified Barium Swallow Progress Note  Patient Details  Name: Tim [name] MRN: [id number] Date of Birth: Dec 02, 1928  Today's Date: 05/09/2018  Modified Barium Swallow completed.  Full report located under Chart Review in the Imaging Section.  Brief recommendations include the following:  Clinical Impression Pt presents with normal oral swallow, and mild pharyngeal dysphagia. Pt was given trials of thin liquid, nectar thick liquid, puree, and solid consistencies. Swallow reflex was slightly delayed, at the level of the vallecular sinus, on thin and nectar thick liquids. Nectar thick liquids were tolerated without penetration or aspiration. Aspiration of thin liquids was noted during large or consecutive swallows, but not when small individual sips were taken. Penetration of thin liquids via straw was also noted. Puree and cracker were tolerated without penetration, aspiration, or post-swallow residue. Barium tablet was given with thin liquids, and was noted to pause briefly in the vallecular sinus, then cleared with second swallow.   Esophageal sweep was unremarkable. It is suspected that the aspiration even seen on the esophageal work up was due to pt taking large consecutive boluses, as this was seen on this study, and was avoided with small individual sips.    Recommend continuing with regular solids and thin liquids via small individual cup sips - no serial or large boluses, and avoiding straw use. Safe swallow precautions were reviewed with pt and family. No further ST intervention is recommended at this time. Please reconsult if needs arise.   Swallow Evaluation Recommendations  SLP Diet Recommendations: Regular solids;Thin liquid   Liquid Administration via: Cup;No straw   Medication Administration: Whole meds with liquid   Supervision: Patient able to self feed   Compensations: Minimize environmental distractions;Slow rate;Small sips/bites;Clear throat intermittently   Postural Changes: Remain semi-upright after after feeds/meals (Comment);Seated upright at 90 degrees   Oral Care Recommendations: Oral care BID     Tim [name] [facility], [name] Speech Language Pathologist [phone]  Shonna Chock 05/09/2018,3:53 PM

## 2018-05-10 ENCOUNTER — Other Ambulatory Visit: Payer: Self-pay | Admitting: Pulmonary Disease

## 2018-05-10 DIAGNOSIS — T17908A Unspecified foreign body in respiratory tract, part unspecified causing other injury, initial encounter: Secondary | ICD-10-CM

## 2018-05-11 NOTE — Telephone Encounter (Signed)
Noted, thanks!

## 2018-06-02 ENCOUNTER — Other Ambulatory Visit: Payer: Medicare Other

## 2018-06-02 ENCOUNTER — Ambulatory Visit: Payer: Medicare Other | Admitting: Cardiothoracic Surgery

## 2018-06-14 ENCOUNTER — Inpatient Hospital Stay: Admission: RE | Admit: 2018-06-14 | Payer: Medicare Other | Source: Ambulatory Visit

## 2018-06-16 ENCOUNTER — Ambulatory Visit: Payer: Medicare Other | Admitting: Cardiothoracic Surgery

## 2018-06-27 ENCOUNTER — Ambulatory Visit (INDEPENDENT_AMBULATORY_CARE_PROVIDER_SITE_OTHER): Payer: Medicare Other | Admitting: Pulmonary Disease

## 2018-06-27 ENCOUNTER — Encounter: Payer: Self-pay | Admitting: Pulmonary Disease

## 2018-06-27 VITALS — BP 132/67 | HR 100 | Ht 70.47 in | Wt 186.6 lb

## 2018-06-27 DIAGNOSIS — J181 Lobar pneumonia, unspecified organism: Secondary | ICD-10-CM

## 2018-06-27 DIAGNOSIS — J301 Allergic rhinitis due to pollen: Secondary | ICD-10-CM

## 2018-06-27 DIAGNOSIS — R1310 Dysphagia, unspecified: Secondary | ICD-10-CM

## 2018-06-27 DIAGNOSIS — T17908A Unspecified foreign body in respiratory tract, part unspecified causing other injury, initial encounter: Secondary | ICD-10-CM

## 2018-06-27 DIAGNOSIS — R918 Other nonspecific abnormal finding of lung field: Secondary | ICD-10-CM

## 2018-06-27 DIAGNOSIS — J411 Mucopurulent chronic bronchitis: Secondary | ICD-10-CM | POA: Diagnosis not present

## 2018-06-27 MED ORDER — FLUTICASONE FUROATE-VILANTEROL 200-25 MCG/INH IN AEPB: 1.0000 | INHALATION_SPRAY | Freq: Every day | RESPIRATORY_TRACT | 5 refills | Status: DC

## 2018-06-27 NOTE — Progress Notes (Signed)
Subjective:    Patient ID: Tim [name], male    DOB: 1929-08-28, 84 y.o.   MRN: [id number]  Synopsis: Former patient of Dr. Gwenette Greet with COPD and a paralyzed R Hemidiaphragm as seen on fluoroscopy in 2007. Lung function testing in 2005 showed clear airflow obstruction, FEV1 of 1.53 L (43% predicted), total lung capacity 62% predicted, DLCO 73% predicted. He smoked 2-3 packs per day for 40 years, quit around 1980.  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   Had CABG by Dr. Servando Snare in 6761 complicated by a pericardial effusion.  Found to have evidence of pharygneal phase dysphagia and aspiraiton on modified barium swallow in 04/2018.   HPI Chief Complaint  Patient presents with  . Follow-up    productive cough (clear/white), shortness of breath, body aches   Ed says that he is "pretty good" and his energy level is still not great.  He still complains of dyspnea. He has a lot of joint aches from time to time. He went for his barium swallow test.    He says that the Xopenex makes him less dizzy and anxious as the albuterol.  He continues to cough a lot and he says that this is his biggest problem.  He has a lot of drainage in his throat. He coughs up clear mucus.   Past Medical History:  Diagnosis Date  . AAA (abdominal aortic aneurysm) (HCC)    3.2 cm 07/2016; 2-3 year f/u recommended  . Asthma   . BPH (benign prostatic hypertrophy)   . CAD (coronary artery disease)    9 STEMI 2001, PCI, RCA, residual 100% circumflex  /   PCI for in-stent restenosis June, 2001  /  nuclear February, 2010, no ischemia  . Cancer ([place])    skin cancer removed  . Carotid artery disease ([place])    Doppler, June, 2011, 0-39% bilateral, pt denies  . Cervical disc disease    Status post neurosurgery  . Colon polyps   . Ejection fraction    EF 55-65%, echo, 2009  . Elevated CPK    Chronic mild CPK elevation  . Emphysema    Mild  . Fatigue    Morning fatigue, August,  2011  . GERD (gastroesophageal reflux disease)   . Headache    back of head  . Hyperlipidemia   . Hypertension   . Low back pain    February, 2013  . Myocardial infarction ([place])   . Paralyzed hemidiaphragm    Right  . Paralyzed hemidiaphragm    Chronic  . Statin intolerance    Elevated CPK in the past      Review of Systems  Constitutional: Negative for fatigue and fever.  HENT: Negative for postnasal drip and rhinorrhea.   Respiratory: Positive for cough. Negative for shortness of breath and wheezing.   Cardiovascular: Negative for chest pain and leg swelling.       Objective:   Physical Exam Vitals:   06/27/18 0906  BP: 132/67  Pulse: 100  SpO2: 95%  Weight: 186 lb 9.6 oz (84.6 kg)  Height: 5' 10.47" (1.79 m)   RA  Gen: chronically ill appearing HENT: OP clear, TM's clear, neck supple PULM: Good air movement, some wheezing B, normal percussion CV: RRR, no mgr, trace edema GI: BS+, soft, nontender Derm: no cyanosis or rash Psyche: normal mood and affect   Chest imaging 01/2016 CXR > granuloma left upper lobe November 2018 CT chest: Less  than 4 mm solid pleural-based right upper lobe nodule noted, groundglass nodule 11.9 mm right upper lobe, centrilobular emphysema noted, nonspecific scarring in the bases.  Images independently reviewed by me November 10, 2017 CT chest images independently reviewed showing mild to moderate centrilobular emphysema, atelectasis in the bases, left upper lobe nodule 12 mm in size new from prior, stable right lung groundglass nodule is dating back to 2008, moderate to large loculated pericardial effusion noted 04/2018 CT chest > new RUL spiculated nodule, 2.2 x 3.7 cm bilateral uper lobe nodules, emphysema, images independently reviewed  Labs: CBC    Component Value Date/Time   WBC 10.1 09/28/2017 1239   RBC 3.32 (L) 09/28/2017 1239   HGB 9.2 (L) 09/28/2017 1239   HGB 13.0 07/27/2017 1020   HCT 28.6 (L) 09/28/2017 1239   HCT  38.3 07/27/2017 1020   PLT 324.0 09/28/2017 1239   PLT 269 07/27/2017 1020   MCV 86.3 09/28/2017 1239   MCV 87 07/27/2017 1020   MCH 28.6 08/31/2017 0429   MCHC 32.0 09/28/2017 1239   RDW 16.1 (H) 09/28/2017 1239   RDW 14.7 07/27/2017 1020   LYMPHSABS 1.3 09/28/2017 1239   MONOABS 0.6 09/28/2017 1239   EOSABS 0.2 09/28/2017 1239   BASOSABS 0.0 09/28/2017 1239   Labs: 08/2017 IgE normal January 2019 IgE normal  Exhaled nitric oxide test: January 2018 22 ppm    Orther imaging: 04/2018 Barium swallow and modified barium swallow showed gross aspiration, therapy and instruction offered.  Records from his visit here earlier in the month noted where he was treated for pneumonia     Assessment & Plan:   Lung mass  Lobar pneumonia, unspecified organism ([place])  Dysphagia, unspecified type  Aspiration into airway, initial encounter  Mucopurulent chronic bronchitis ([place])  Allergic rhinitis due to pollen, unspecified seasonality  Discussion: As suspected Tim [name] has aspiration.  He does not remember much from the speech therapy visit but he says he knows he is not supposed to use a straw.  Today we reviewed the importance of taking small bites, small sips.  I reviewed the fact that this is the reason why he has recurrent pneumonia so he needs to be very careful with this.  In addition he continues to have some degree of wheezing.  I believe he has an asthma COPD overlap syndrome.  We will increase the dose of Breo from 100 to 200.  This is complicated by allergic rhinitis.  I think if he added an over-the-counter antihistamine this to be helpful.  He has pneumonia seen on the most recent CT chest, they described as a mass but I think it was likely just pneumonia.  We need to make sure that this is clearing up.  Plan: Recent abnormal CT chest showing pneumonia: We will repeat a CT scan of your chest now  COPD/asthma overlap syndrome: Increase Breo to 200 Take Breo 1  puff daily no matter how you feel I am glad you had a flu shot Continue using Xopenex as needed for chest tightness wheezing or shortness of breath  Allergic rhinitis: Continue montelukast 10 daily Start taking over-the-counter Zyrtec (cetirizine 10 mg daily) Continue Flonase 2 sprays each nostril daily Use saline rinses as needed  Aspiration/dysphagia: Follow the speech therapy recommendations  We will see you back in 6 to 8 weeks or sooner if needed  Greater than 50% of this 25-minute visit spent face     Current Outpatient Medications:  .  albuterol (PROVENTIL HFA;VENTOLIN  HFA) 108 (90 BASE) MCG/ACT inhaler, Inhale 2 puffs into the lungs every 6 (six) hours as needed for wheezing or shortness of breath., Disp: 1 Inhaler, Rfl: 6 .  aspirin EC 81 MG tablet, Take 81 mg by mouth daily., Disp: , Rfl:  .  finasteride (PROSCAR) 5 MG tablet, Take 5 mg by mouth 2 (two) times daily. , Disp: , Rfl:  .  IRON PO, Take by mouth as directed., Disp: , Rfl:  .  levalbuterol (XOPENEX) 0.63 MG/3ML nebulizer solution, Take 3 mLs (0.63 mg total) by nebulization every 4 (four) hours as needed for wheezing or shortness of breath., Disp: 360 mL, Rfl: 5 .  montelukast (SINGULAIR) 10 MG tablet, Take 10 mg by mouth at bedtime., Disp: , Rfl:  .  Multiple Vitamins-Minerals (ICAPS PO), Take 1 capsule by mouth daily., Disp: , Rfl:  .  omeprazole (PRILOSEC) 40 MG capsule, TAKE 1 CAPSULE BY MOUTH TWICE A DAY, Disp: 180 capsule, Rfl: 1 .  potassium chloride SA (K-DUR,KLOR-CON) 20 MEQ tablet, Take 1 tablet (20 mEq total) by mouth daily., Disp: 90 tablet, Rfl: 3 .  sodium chloride HYPERTONIC 3 % nebulizer solution, Take by nebulization 2 (two) times daily., Disp: 300 mL, Rfl: 11 .  tamsulosin (FLOMAX) 0.4 MG CAPS capsule, Take 0.4 mg by mouth 2 (two) times daily. For urinary symptoms , Disp: , Rfl:  .  fluticasone furoate-vilanterol (BREO ELLIPTA) 200-25 MCG/INH AEPB, Inhale 1 puff into the lungs daily., Disp: 1 each,  Rfl: 5 .  furosemide (LASIX) 40 MG tablet, Take 1 tablet (40 mg total) by mouth daily., Disp: 90 tablet, Rfl: 3

## 2018-06-27 NOTE — Patient Instructions (Signed)
Recent abnormal CT chest showing pneumonia: We will repeat a CT scan of your chest now  COPD/asthma overlap syndrome: Increase Breo to 200 Take Breo 1 puff daily no matter how you feel I am glad you had a flu shot Continue using Xopenex as needed for chest tightness wheezing or shortness of breath  Allergic rhinitis: Continue montelukast 10 daily Start taking over-the-counter Zyrtec (cetirizine 10 mg daily) Continue Flonase 2 sprays each nostril daily Use saline rinses as needed  Aspiration/dysphagia: Follow the speech therapy recommendations  We will see you back in 6 to 8 weeks or sooner if needed

## 2018-07-11 ENCOUNTER — Ambulatory Visit (INDEPENDENT_AMBULATORY_CARE_PROVIDER_SITE_OTHER)
Admission: RE | Admit: 2018-07-11 | Discharge: 2018-07-11 | Disposition: A | Discharge status: Home / Self Care | Payer: Medicare Other | Source: Ambulatory Visit | Attending: Pulmonary Disease | Admitting: Pulmonary Disease

## 2018-07-11 DIAGNOSIS — J181 Lobar pneumonia, unspecified organism: Secondary | ICD-10-CM

## 2018-07-11 DIAGNOSIS — R918 Other nonspecific abnormal finding of lung field: Secondary | ICD-10-CM

## 2018-07-12 ENCOUNTER — Telehealth: Payer: Self-pay | Admitting: Pulmonary Disease

## 2018-07-12 NOTE — Telephone Encounter (Signed)
Notes recorded by Dolores Lory, RN on 07/12/2018 at 9:29 AM EDT Attempted to call patient, no answer, left message to call back. ------  Notes recorded by Juanito Doom, MD on 07/12/2018 at 8:35 AM EDT BJ, Please let the patient know this showed the nodule was still present and needs close monitoring. We will discuss options for moving forward in his next visit, please ensure he is seeing me in the next 2-4 weeks. Thanks, B  Called and spoke with pt's daughter [name] letting her know the results of pt's ct scan. Stated to her for pt to keep f/u visit so BQ could discuss scan in more detail and go from there to see if anything else needed to be done in regards to CT results.  Linda expressed understanding. Nothing further needed.

## 2018-08-15 ENCOUNTER — Ambulatory Visit (INDEPENDENT_AMBULATORY_CARE_PROVIDER_SITE_OTHER): Payer: Medicare Other | Admitting: Pulmonary Disease

## 2018-08-15 ENCOUNTER — Encounter: Payer: Self-pay | Admitting: Pulmonary Disease

## 2018-08-15 VITALS — BP 140/72 | HR 110 | Ht 71.65 in | Wt 178.4 lb

## 2018-08-15 DIAGNOSIS — J181 Lobar pneumonia, unspecified organism: Secondary | ICD-10-CM

## 2018-08-15 DIAGNOSIS — T17908A Unspecified foreign body in respiratory tract, part unspecified causing other injury, initial encounter: Secondary | ICD-10-CM

## 2018-08-15 DIAGNOSIS — R911 Solitary pulmonary nodule: Secondary | ICD-10-CM

## 2018-08-15 DIAGNOSIS — J4550 Severe persistent asthma, uncomplicated: Secondary | ICD-10-CM

## 2018-08-15 DIAGNOSIS — J432 Centrilobular emphysema: Secondary | ICD-10-CM

## 2018-08-15 DIAGNOSIS — R1310 Dysphagia, unspecified: Secondary | ICD-10-CM | POA: Diagnosis not present

## 2018-08-15 DIAGNOSIS — J411 Mucopurulent chronic bronchitis: Secondary | ICD-10-CM

## 2018-08-15 DIAGNOSIS — J301 Allergic rhinitis due to pollen: Secondary | ICD-10-CM

## 2018-08-15 DIAGNOSIS — R918 Other nonspecific abnormal finding of lung field: Secondary | ICD-10-CM

## 2018-08-15 MED ORDER — FLUTICASONE FUROATE-VILANTEROL 200-25 MCG/INH IN AEPB: 1.0000 | INHALATION_SPRAY | Freq: Every day | RESPIRATORY_TRACT | 0 refills | Status: DC

## 2018-08-15 MED ORDER — IPRATROPIUM BROMIDE 0.03 % NA SOLN: 2.0000 | Freq: Two times a day (BID) | NASAL | 5 refills | Status: DC

## 2018-08-15 NOTE — Patient Instructions (Signed)
Lung mass: We are going to arrange for a test called a PET scan If this test is positive then we need to arrange for a biopsy of the lung mass  Continuous postnasal drip: Keep taking montelukast daily Keep using fluticasone nose spray 2 sprays each nostril daily Start taking ipratropium nose spray for allergic rhinitis, you can take 2 sprays each nostril every 6 hours as needed  Severe persistent asthma: Continue taking Breo 1 puff daily Go home and ask for a medication formulary from your insurance company.  When you receive it call us so that we can go through your medicines on the phone and sort out if there is a cheaper option than Breo. Continue using Xopenex as needed for chest tightness wheezing or shortness of breath  Paralyzed hemidiaphragm: Call us in the event of any increasing cough, fever, chills or mucus production  Recurrent aspiration: Continue to follow the aspiration and dysphagia recommendations from the speech therapist  Follow up in 6 weeks or sooner if needed

## 2018-08-15 NOTE — Progress Notes (Signed)
Subjective:    Patient ID: Tim [name], male    DOB: 12-May-1929, 84 y.o.   MRN: [id number]  Synopsis: Former patient of Dr. Gwenette [name] with COPD and a paralyzed R Hemidiaphragm as seen on fluoroscopy in 2007. Lung function testing in 2005 showed clear airflow obstruction, FEV1 of 1.53 L (43% predicted), total lung capacity 62% predicted, DLCO 73% predicted. He smoked 2-3 packs per day for 40 years, quit around 1980.  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   Had CABG by Dr. Servando [name] in 3790 complicated by a pericardial effusion.  Found to have evidence of pharygneal phase dysphagia and aspiraiton on modified barium swallow in 04/2018.   HPI Chief Complaint  Patient presents with  . Follow-up    wheezing, clear mucus on cough    Still spitting up gobs of white mucus day and night.  He feels that it is coming up from his sinuses.  The mucus hangs up in his throat. He is not taking zyrtec anymore because it didn't hlpe.  He is still taking Flonase twice a day and a montelukast.    He is still wheezing a lot.  His Breo is too expensive (copay is $80/month).  His albuterol is $40 a month, he tries to use it less than once a day, he only uses it when he is really wheezing.   Past Medical History:  Diagnosis Date  . AAA (abdominal aortic aneurysm) (HCC)    3.2 cm 07/2016; 2-3 year f/u recommended  . Asthma   . BPH (benign prostatic hypertrophy)   . CAD (coronary artery disease)    9 STEMI 2001, PCI, RCA, residual 100% circumflex  /   PCI for in-stent restenosis June, 2001  /  nuclear February, 2010, no ischemia  . Cancer ([place])    skin cancer removed  . Carotid artery disease ([place])    Doppler, June, 2011, 0-39% bilateral, pt denies  . Cervical disc disease    Status post neurosurgery  . Colon polyps   . Ejection fraction    EF 55-65%, echo, 2009  . Elevated CPK    Chronic mild CPK elevation  . Emphysema    Mild  . Fatigue    Morning fatigue,  August, 2011  . GERD (gastroesophageal reflux disease)   . Headache    back of head  . Hyperlipidemia   . Hypertension   . Low back pain    February, 2013  . Myocardial infarction ([place])   . Paralyzed hemidiaphragm    Right  . Paralyzed hemidiaphragm    Chronic  . Statin intolerance    Elevated CPK in the past      Review of Systems  Constitutional: Negative for fatigue and fever.  HENT: Negative for postnasal drip and rhinorrhea.   Respiratory: Positive for cough. Negative for shortness of breath and wheezing.   Cardiovascular: Negative for chest pain and leg swelling.       Objective:   Physical Exam Vitals:   08/15/18 1126  BP: 140/72  Pulse: (!) 110  SpO2: 94%  Weight: 178 lb 6.4 oz (80.9 kg)  Height: 5' 11.65" (1.82 m)   RA  Gen: chronically ill appearing HENT: OP clear, TM's clear, neck supple PULM: CTA B, normal percussion CV: RRR, no mgr, trace edema GI: BS+, soft, nontender Derm: no cyanosis or rash Psyche: normal mood and affect   Chest imaging 01/2016 CXR > granuloma left upper lobe November 2018  CT chest: Less than 4 mm solid pleural-based right upper lobe nodule noted, groundglass nodule 11.9 mm right upper lobe, centrilobular emphysema noted, nonspecific scarring in the bases.  Images independently reviewed by me November 10, 2017 CT chest images independently reviewed showing mild to moderate centrilobular emphysema, atelectasis in the bases, left upper lobe nodule 12 mm in size new from prior, stable right lung groundglass nodule is dating back to 2008, moderate to large loculated pericardial effusion noted 04/2018 CT chest > new RUL spiculated nodule, 2.2 x 3.7 cm bilateral uper lobe nodules, emphysema, images independently reviewed October 2019 CT chest showed persistent nodule 3.9 cm in the right upper lobe, stable lower lobe nodules, emphysema noted. Personally reviewed  Labs: CBC    Component Value Date/Time   WBC 10.1 09/28/2017 1239    RBC 3.32 (L) 09/28/2017 1239   HGB 9.2 (L) 09/28/2017 1239   HGB 13.0 07/27/2017 1020   HCT 28.6 (L) 09/28/2017 1239   HCT 38.3 07/27/2017 1020   PLT 324.0 09/28/2017 1239   PLT 269 07/27/2017 1020   MCV 86.3 09/28/2017 1239   MCV 87 07/27/2017 1020   MCH 28.6 08/31/2017 0429   MCHC 32.0 09/28/2017 1239   RDW 16.1 (H) 09/28/2017 1239   RDW 14.7 07/27/2017 1020   LYMPHSABS 1.3 09/28/2017 1239   MONOABS 0.6 09/28/2017 1239   EOSABS 0.2 09/28/2017 1239   BASOSABS 0.0 09/28/2017 1239   Labs: 08/2017 IgE normal January 2019 IgE normal  Exhaled nitric oxide test: January 2018 22 ppm    Orther imaging: 04/2018 Barium swallow and modified barium swallow showed gross aspiration, therapy and instruction offered.  Records from his visit here earlier in the month noted where he was treated for pneumonia     Assessment & Plan:   Lung mass  Solitary pulmonary nodule - Plan: NM PET Image Initial (PI) Skull Base To Thigh  Lobar pneumonia, unspecified organism ([place])  Dysphagia, unspecified type  Aspiration into airway, initial encounter  Mucopurulent chronic bronchitis (HCC)  Allergic rhinitis due to pollen, unspecified seasonality  Centrilobular emphysema ([place])  Severe persistent asthma without complication  Discussion: [name] continues to have a right upper lobe mass which is new compared to a year ago.  This occurred when he had pneumonia so I was hopeful that this was just scarring from this, but given his asbestos exposure, his smoking history, and his emphysema he is at increased risk for cancer so I think we need to image this and then decide whether or not he needs a biopsy.  We will arrange for a PET scan.  He continues to struggle with a postnasal drip, we have treated him with lots of forms of allergic rhinitis medicines but these have been largely ineffective.  I am going to treat him for vasomotor rhinitis.  If that does not help then I think he needs to see  an ear nose and throat doctor.  In regards to his severe persistent asthma he continues to struggle with day-to-day mucus production and wheezing.  He is not taking his controller medicine because it is too expensive.  We will give him samples of this and I have encouraged him to call us with his medication formulary.  Plan: Lung mass: We are going to arrange for a test called a PET scan If this test is positive then we need to arrange for a biopsy of the lung mass  Continuous postnasal drip: Keep taking montelukast daily Keep using fluticasone nose spray  2 sprays each nostril daily Start taking ipratropium nose spray for allergic rhinitis, you can take 2 sprays each nostril every 6 hours as needed  Severe persistent asthma: Continue taking Breo 1 puff daily Go home and ask for a medication formulary from your insurance company.  When you receive it call us so that we can go through your medicines on the phone and sort out if there is a cheaper option than Breo. Continue using Xopenex as needed for chest tightness wheezing or shortness of breath  Paralyzed hemidiaphragm: Call us in the event of any increasing cough, fever, chills or mucus production  Recurrent aspiration: Continue to follow the aspiration and dysphagia recommendations from the speech therapist  Follow up in 6 weeks or sooner if needed   Current Outpatient Medications:  .  albuterol (PROVENTIL HFA;VENTOLIN HFA) 108 (90 BASE) MCG/ACT inhaler, Inhale 2 puffs into the lungs every 6 (six) hours as needed for wheezing or shortness of breath., Disp: 1 Inhaler, Rfl: 6 .  aspirin EC 81 MG tablet, Take 81 mg by mouth daily., Disp: , Rfl:  .  calcium carbonate (TUMS - DOSED IN MG ELEMENTAL CALCIUM) 500 MG chewable tablet, Chew 1 tablet by mouth daily., Disp: , Rfl:  .  finasteride (PROSCAR) 5 MG tablet, Take 5 mg by mouth 2 (two) times daily. , Disp: , Rfl:  .  fluticasone furoate-vilanterol (BREO ELLIPTA) 200-25 MCG/INH AEPB,  Inhale 1 puff into the lungs daily., Disp: 1 each, Rfl: 5 .  IRON PO, Take by mouth as directed., Disp: , Rfl:  .  levalbuterol (XOPENEX) 0.63 MG/3ML nebulizer solution, Take 3 mLs (0.63 mg total) by nebulization every 4 (four) hours as needed for wheezing or shortness of breath., Disp: 360 mL, Rfl: 5 .  montelukast (SINGULAIR) 10 MG tablet, Take 10 mg by mouth at bedtime., Disp: , Rfl:  .  Multiple Vitamins-Minerals (ICAPS PO), Take 1 capsule by mouth daily., Disp: , Rfl:  .  potassium chloride SA (K-DUR,KLOR-CON) 20 MEQ tablet, Take 1 tablet (20 mEq total) by mouth daily., Disp: 90 tablet, Rfl: 3 .  tamsulosin (FLOMAX) 0.4 MG CAPS capsule, Take 0.4 mg by mouth 2 (two) times daily. For urinary symptoms , Disp: , Rfl:  .  fluticasone furoate-vilanterol (BREO ELLIPTA) 200-25 MCG/INH AEPB, Inhale 1 puff into the lungs daily., Disp: 1 each, Rfl: 0 .  furosemide (LASIX) 40 MG tablet, Take 1 tablet (40 mg total) by mouth daily., Disp: 90 tablet, Rfl: 3 .  ipratropium (ATROVENT) 0.03 % nasal spray, Place 2 sprays into both nostrils every 12 (twelve) hours., Disp: 30 mL, Rfl: 5 .  sodium chloride HYPERTONIC 3 % nebulizer solution, Take by nebulization 2 (two) times daily. (Patient not taking: Reported on 08/15/2018), Disp: 300 mL, Rfl: 11

## 2018-08-24 ENCOUNTER — Ambulatory Visit (HOSPITAL_COMMUNITY): Payer: Medicare Other

## 2018-09-09 ENCOUNTER — Ambulatory Visit (HOSPITAL_COMMUNITY)
Admission: RE | Admit: 2018-09-09 | Discharge: 2018-09-09 | Disposition: A | Discharge status: Home / Self Care | Payer: Medicare Other | Source: Ambulatory Visit | Attending: Pulmonary Disease | Admitting: Pulmonary Disease

## 2018-09-09 DIAGNOSIS — R911 Solitary pulmonary nodule: Secondary | ICD-10-CM | POA: Diagnosis not present

## 2018-09-09 LAB — GLUCOSE, CAPILLARY: Glucose-Capillary: 109 mg/dL — ABNORMAL HIGH (ref 70–99)

## 2018-09-09 MED ORDER — FLUDEOXYGLUCOSE F - 18 (FDG) INJECTION
8.8400 | Freq: Once | INTRAVENOUS | Status: AC | PRN
  Administered 2018-09-09: 8.84 via INTRAVENOUS

## 2018-09-13 ENCOUNTER — Telehealth: Payer: Self-pay | Admitting: Pulmonary Disease

## 2018-09-13 NOTE — Telephone Encounter (Signed)
Called and spoke with patients daughter, she is aware of results and verbalized understanding. Nothing further needed.  

## 2018-09-22 ENCOUNTER — Other Ambulatory Visit: Payer: Self-pay

## 2018-09-22 MED ORDER — MONTELUKAST SODIUM 10 MG PO TABS: 10.0000 mg | ORAL_TABLET | Freq: Every day | ORAL | 11 refills | Status: DC

## 2018-09-27 ENCOUNTER — Ambulatory Visit: Payer: Medicare Other | Admitting: Pulmonary Disease

## 2018-09-28 ENCOUNTER — Other Ambulatory Visit: Payer: Self-pay | Admitting: Cardiology

## 2018-10-03 ENCOUNTER — Encounter (HOSPITAL_COMMUNITY): Payer: Self-pay | Admitting: Emergency Medicine

## 2018-10-03 ENCOUNTER — Other Ambulatory Visit: Payer: Self-pay

## 2018-10-03 ENCOUNTER — Inpatient Hospital Stay (HOSPITAL_COMMUNITY)
Admission: EM | Admit: 2018-10-03 | Discharge: 2018-10-06 | DRG: 389 | Disposition: A | Discharge status: Home / Self Care | Payer: Medicare Other | Attending: Internal Medicine | Admitting: Internal Medicine

## 2018-10-03 ENCOUNTER — Emergency Department (HOSPITAL_COMMUNITY): Payer: Medicare Other

## 2018-10-03 DIAGNOSIS — Z951 Presence of aortocoronary bypass graft: Secondary | ICD-10-CM | POA: Diagnosis not present

## 2018-10-03 DIAGNOSIS — D509 Iron deficiency anemia, unspecified: Secondary | ICD-10-CM | POA: Diagnosis present

## 2018-10-03 DIAGNOSIS — K8020 Calculus of gallbladder without cholecystitis without obstruction: Secondary | ICD-10-CM | POA: Diagnosis present

## 2018-10-03 DIAGNOSIS — K56609 Unspecified intestinal obstruction, unspecified as to partial versus complete obstruction: Secondary | ICD-10-CM

## 2018-10-03 DIAGNOSIS — I129 Hypertensive chronic kidney disease with stage 1 through stage 4 chronic kidney disease, or unspecified chronic kidney disease: Secondary | ICD-10-CM | POA: Diagnosis not present

## 2018-10-03 DIAGNOSIS — N183 Chronic kidney disease, stage 3 unspecified: Secondary | ICD-10-CM | POA: Insufficient documentation

## 2018-10-03 DIAGNOSIS — K219 Gastro-esophageal reflux disease without esophagitis: Secondary | ICD-10-CM | POA: Diagnosis present

## 2018-10-03 DIAGNOSIS — Z833 Family history of diabetes mellitus: Secondary | ICD-10-CM | POA: Diagnosis not present

## 2018-10-03 DIAGNOSIS — K8080 Other cholelithiasis without obstruction: Secondary | ICD-10-CM | POA: Diagnosis not present

## 2018-10-03 DIAGNOSIS — Z87891 Personal history of nicotine dependence: Secondary | ICD-10-CM | POA: Diagnosis not present

## 2018-10-03 DIAGNOSIS — N400 Enlarged prostate without lower urinary tract symptoms: Secondary | ICD-10-CM | POA: Diagnosis present

## 2018-10-03 DIAGNOSIS — Z841 Family history of disorders of kidney and ureter: Secondary | ICD-10-CM | POA: Diagnosis not present

## 2018-10-03 DIAGNOSIS — Z7951 Long term (current) use of inhaled steroids: Secondary | ICD-10-CM | POA: Diagnosis not present

## 2018-10-03 DIAGNOSIS — I509 Heart failure, unspecified: Secondary | ICD-10-CM | POA: Diagnosis present

## 2018-10-03 DIAGNOSIS — E860 Dehydration: Secondary | ICD-10-CM | POA: Diagnosis present

## 2018-10-03 DIAGNOSIS — K5730 Diverticulosis of large intestine without perforation or abscess without bleeding: Secondary | ICD-10-CM | POA: Diagnosis not present

## 2018-10-03 DIAGNOSIS — Z77090 Contact with and (suspected) exposure to asbestos: Secondary | ICD-10-CM | POA: Diagnosis present

## 2018-10-03 DIAGNOSIS — J986 Disorders of diaphragm: Secondary | ICD-10-CM | POA: Diagnosis present

## 2018-10-03 DIAGNOSIS — I130 Hypertensive heart and chronic kidney disease with heart failure and stage 1 through stage 4 chronic kidney disease, or unspecified chronic kidney disease: Secondary | ICD-10-CM | POA: Diagnosis present

## 2018-10-03 DIAGNOSIS — N289 Disorder of kidney and ureter, unspecified: Secondary | ICD-10-CM

## 2018-10-03 DIAGNOSIS — Z572 Occupational exposure to dust: Secondary | ICD-10-CM

## 2018-10-03 DIAGNOSIS — E785 Hyperlipidemia, unspecified: Secondary | ICD-10-CM | POA: Diagnosis present

## 2018-10-03 DIAGNOSIS — K567 Ileus, unspecified: Principal | ICD-10-CM | POA: Diagnosis present

## 2018-10-03 DIAGNOSIS — Z79899 Other long term (current) drug therapy: Secondary | ICD-10-CM

## 2018-10-03 DIAGNOSIS — Z7982 Long term (current) use of aspirin: Secondary | ICD-10-CM | POA: Diagnosis not present

## 2018-10-03 DIAGNOSIS — I252 Old myocardial infarction: Secondary | ICD-10-CM | POA: Diagnosis not present

## 2018-10-03 DIAGNOSIS — I2510 Atherosclerotic heart disease of native coronary artery without angina pectoris: Secondary | ICD-10-CM | POA: Diagnosis present

## 2018-10-03 DIAGNOSIS — N179 Acute kidney failure, unspecified: Secondary | ICD-10-CM | POA: Diagnosis present

## 2018-10-03 DIAGNOSIS — Z888 Allergy status to other drugs, medicaments and biological substances status: Secondary | ICD-10-CM

## 2018-10-03 DIAGNOSIS — J439 Emphysema, unspecified: Secondary | ICD-10-CM | POA: Diagnosis present

## 2018-10-03 DIAGNOSIS — I739 Peripheral vascular disease, unspecified: Secondary | ICD-10-CM | POA: Diagnosis present

## 2018-10-03 DIAGNOSIS — I10 Essential (primary) hypertension: Secondary | ICD-10-CM | POA: Diagnosis not present

## 2018-10-03 DIAGNOSIS — N189 Chronic kidney disease, unspecified: Secondary | ICD-10-CM | POA: Insufficient documentation

## 2018-10-03 LAB — CBC WITH DIFFERENTIAL/PLATELET
Abs Immature Granulocytes: 0.07 10*3/uL (ref 0.00–0.07)
Basophils Absolute: 0 10*3/uL (ref 0.0–0.1)
Basophils Relative: 0 %
Eosinophils Absolute: 0.1 10*3/uL (ref 0.0–0.5)
Eosinophils Relative: 1 %
HCT: 33.8 % — ABNORMAL LOW (ref 39.0–52.0)
Hemoglobin: 10.3 g/dL — ABNORMAL LOW (ref 13.0–17.0)
Immature Granulocytes: 1 %
Lymphocytes Relative: 10 %
Lymphs Abs: 1.3 10*3/uL (ref 0.7–4.0)
MCH: 25 pg — ABNORMAL LOW (ref 26.0–34.0)
MCHC: 30.5 g/dL (ref 30.0–36.0)
MCV: 82 fL (ref 80.0–100.0)
Monocytes Absolute: 1.2 10*3/uL — ABNORMAL HIGH (ref 0.1–1.0)
Monocytes Relative: 9 %
Neutro Abs: 10.2 10*3/uL — ABNORMAL HIGH (ref 1.7–7.7)
Neutrophils Relative %: 79 %
Platelets: 328 10*3/uL (ref 150–400)
RBC: 4.12 MIL/uL — ABNORMAL LOW (ref 4.22–5.81)
RDW: 17 % — ABNORMAL HIGH (ref 11.5–15.5)
WBC: 12.9 10*3/uL — ABNORMAL HIGH (ref 4.0–10.5)
nRBC: 0 % (ref 0.0–0.2)

## 2018-10-03 LAB — URINALYSIS, ROUTINE W REFLEX MICROSCOPIC
Bilirubin Urine: NEGATIVE
Glucose, UA: NEGATIVE mg/dL
Hgb urine dipstick: NEGATIVE
Ketones, ur: NEGATIVE mg/dL
Leukocytes, UA: NEGATIVE
Nitrite: NEGATIVE
Protein, ur: 30 mg/dL — AB
Specific Gravity, Urine: 1.024 (ref 1.005–1.030)
pH: 5 (ref 5.0–8.0)

## 2018-10-03 LAB — COMPREHENSIVE METABOLIC PANEL
ALT: 18 U/L (ref 0–44)
AST: 17 U/L (ref 15–41)
Albumin: 2.9 g/dL — ABNORMAL LOW (ref 3.5–5.0)
Alkaline Phosphatase: 68 U/L (ref 38–126)
Anion gap: 12 (ref 5–15)
BUN: 43 mg/dL — ABNORMAL HIGH (ref 8–23)
CO2: 24 mmol/L (ref 22–32)
Calcium: 8.5 mg/dL — ABNORMAL LOW (ref 8.9–10.3)
Chloride: 104 mmol/L (ref 98–111)
Creatinine, Ser: 1.95 mg/dL — ABNORMAL HIGH (ref 0.61–1.24)
GFR calc Af Amer: 35 mL/min — ABNORMAL LOW (ref >60)
GFR calc non Af Amer: 30 mL/min — ABNORMAL LOW (ref >60)
Glucose, Bld: 124 mg/dL — ABNORMAL HIGH (ref 70–99)
Potassium: 4.1 mmol/L (ref 3.5–5.1)
Sodium: 140 mmol/L (ref 135–145)
Total Bilirubin: 1 mg/dL (ref 0.3–1.2)
Total Protein: 6.2 g/dL — ABNORMAL LOW (ref 6.5–8.1)

## 2018-10-03 MED ORDER — TAMSULOSIN HCL 0.4 MG PO CAPS
0.4000 mg | ORAL_CAPSULE | Freq: Two times a day (BID) | ORAL | Status: DC
  Administered 2018-10-03 – 2018-10-05 (×5): 0.4 mg via ORAL
  Filled 2018-10-03 (×5): qty 1

## 2018-10-03 MED ORDER — FLUTICASONE PROPIONATE 50 MCG/ACT NA SUSP
2.0000 | Freq: Every day | NASAL | Status: DC | PRN
  Filled 2018-10-03: qty 16

## 2018-10-03 MED ORDER — MONTELUKAST SODIUM 10 MG PO TABS
10.0000 mg | ORAL_TABLET | Freq: Every day | ORAL | Status: DC
  Administered 2018-10-03 – 2018-10-05 (×3): 10 mg via ORAL
  Filled 2018-10-03 (×3): qty 1

## 2018-10-03 MED ORDER — ALBUTEROL SULFATE (2.5 MG/3ML) 0.083% IN NEBU: 2.5000 mg | INHALATION_SOLUTION | Freq: Four times a day (QID) | RESPIRATORY_TRACT | Status: DC | PRN

## 2018-10-03 MED ORDER — POTASSIUM CHLORIDE IN NACL 20-0.9 MEQ/L-% IV SOLN
INTRAVENOUS | Status: AC
  Administered 2018-10-03: 23:00:00 via INTRAVENOUS
  Filled 2018-10-03: qty 1000

## 2018-10-03 MED ORDER — PANTOPRAZOLE SODIUM 40 MG PO TBEC
40.0000 mg | DELAYED_RELEASE_TABLET | Freq: Every day | ORAL | Status: DC
  Administered 2018-10-04 – 2018-10-05 (×2): 40 mg via ORAL
  Filled 2018-10-03 (×2): qty 1

## 2018-10-03 MED ORDER — IOHEXOL 300 MG/ML  SOLN
100.0000 mL | Freq: Once | INTRAMUSCULAR | Status: AC | PRN
  Administered 2018-10-03: 100 mL via INTRAVENOUS

## 2018-10-03 MED ORDER — DIATRIZOATE MEGLUMINE & SODIUM 66-10 % PO SOLN: 90.0000 mL | Freq: Once | ORAL | Status: DC

## 2018-10-03 MED ORDER — ENOXAPARIN SODIUM 30 MG/0.3ML ~~LOC~~ SOLN
30.0000 mg | SUBCUTANEOUS | Status: DC
  Administered 2018-10-03 – 2018-10-04 (×2): 30 mg via SUBCUTANEOUS
  Filled 2018-10-03 (×2): qty 0.3

## 2018-10-03 MED ORDER — ACETAMINOPHEN 325 MG PO TABS: 650.0000 mg | ORAL_TABLET | Freq: Four times a day (QID) | ORAL | Status: DC | PRN

## 2018-10-03 MED ORDER — FLUTICASONE FUROATE-VILANTEROL 200-25 MCG/INH IN AEPB
1.0000 | INHALATION_SPRAY | Freq: Every day | RESPIRATORY_TRACT | Status: DC
  Administered 2018-10-05: 1 via RESPIRATORY_TRACT
  Filled 2018-10-03 (×2): qty 28

## 2018-10-03 MED ORDER — SODIUM CHLORIDE 0.9 % IV BOLUS
500.0000 mL | Freq: Once | INTRAVENOUS | Status: AC
  Administered 2018-10-03: 500 mL via INTRAVENOUS

## 2018-10-03 MED ORDER — ONDANSETRON HCL 4 MG PO TABS: 4.0000 mg | ORAL_TABLET | Freq: Four times a day (QID) | ORAL | Status: DC | PRN

## 2018-10-03 MED ORDER — IPRATROPIUM BROMIDE 0.06 % NA SOLN
2.0000 | Freq: Two times a day (BID) | NASAL | Status: DC
  Administered 2018-10-04 – 2018-10-05 (×3): 2 via NASAL
  Filled 2018-10-03: qty 15

## 2018-10-03 MED ORDER — ASPIRIN EC 81 MG PO TBEC
81.0000 mg | DELAYED_RELEASE_TABLET | Freq: Every day | ORAL | Status: DC
  Administered 2018-10-04 – 2018-10-05 (×2): 81 mg via ORAL
  Filled 2018-10-03 (×2): qty 1

## 2018-10-03 MED ORDER — ACETAMINOPHEN 650 MG RE SUPP: 650.0000 mg | Freq: Four times a day (QID) | RECTAL | Status: DC | PRN

## 2018-10-03 MED ORDER — FINASTERIDE 5 MG PO TABS
10.0000 mg | ORAL_TABLET | Freq: Every day | ORAL | Status: DC
  Administered 2018-10-04 – 2018-10-05 (×2): 10 mg via ORAL
  Filled 2018-10-03 (×2): qty 2

## 2018-10-03 MED ORDER — ONDANSETRON HCL 4 MG/2ML IJ SOLN: 4.0000 mg | Freq: Four times a day (QID) | INTRAMUSCULAR | Status: DC | PRN

## 2018-10-03 NOTE — ED Provider Notes (Signed)
[facility] EMERGENCY DEPARTMENT Provider Note   CSN: [id number] Arrival date & time: 10/03/18  1324     History   Chief Complaint Chief Complaint  Patient presents with  . Abdominal Pain    HPI Tim [name] is a 85 y.o. male.  HPI  85 year old male presents today complaining of 8-day history of abdominal pain, decreased appetite, and vomiting that began yesterday.  He was initially seen in the [facility] emergency department 8 days ago.  Family reports at that time he had a CT scan that was unremarkable.  Symptoms worsened over the past 24 hours and he was seen by his primary care doctor, Dr. Heber Spur, in [place].  He had repeat x-rays done and was told he had gallstones and likely a small bowel obstruction was sent to the emergency department for further evaluation.  Patient and daughter give the above history.  Patient is currently having some pain but appears comfortable. EXAM:  DG ABDOMEN ACUTE W/ 1V CHEST    COMPARISON: PET-CT-09/09/2018; CT abdomen pelvis-09/26/2018; chest  radiograph-08/25/2018; 07/07/2018    FINDINGS:  Grossly unchanged cardiac silhouette and mediastinal contours post  median sternotomy and CABG. Atherosclerotic plaque within the  thoracic aorta. Minimal bibasilar opacities favored to represent  atelectasis. No discrete focal airspace opacities. No pleural  effusion or pneumothorax. No evidence of edema.    There is moderate to marked gaseous distention of multiple loops of  small bowel with index loop of small bowel within the right mid  hemiabdomen measuring approximately 5.5 cm in diameter with several  air-fluid levels of varying heights seen on the provided upright  radiograph. Residual enteric contrast from prior CT scan is seen  within several colonic diverticuli, however there is an otherwise  conspicuous paucity of distal colonic gas. No definite  pneumoperitoneum, pneumatosis or portal venous gas      Multiple stones overlie the expected location of the neck of the  gallbladder with dominant stone measuring approximately 1.7 cm in  diameter.    Vascular calcifications overlie the lower pelvis. Dystrophic  calcifications overlie the expected location of the prostate gland.  Moderate severe multilevel lumbar spine DDD.  Impressions Performed At  1. Findings worrisome for developing small bowel obstruction.  2. Cholelithiasis as demonstrated on preceding abdominal CT. No  acute cardiopulmonary disease.        Past Medical History:  Diagnosis Date  . AAA (abdominal aortic aneurysm) (HCC)    3.2 cm 07/2016; 2-3 year f/u recommended  . Asthma   . BPH (benign prostatic hypertrophy)   . CAD (coronary artery disease)    9 STEMI 2001, PCI, RCA, residual 100% circumflex  /   PCI for in-stent restenosis June, 2001  /  nuclear February, 2010, no ischemia  . Cancer ([place])    skin cancer removed  . Carotid artery disease ([place])    Doppler, June, 2011, 0-39% bilateral, pt denies  . Cervical disc disease    Status post neurosurgery  . Colon polyps   . Ejection fraction    EF 55-65%, echo, 2009  . Elevated CPK    Chronic mild CPK elevation  . Emphysema    Mild  . Fatigue    Morning fatigue, August, 2011  . GERD (gastroesophageal reflux disease)   . Headache    back of head  . Hyperlipidemia   . Hypertension   . Low back pain    February, 2013  . Myocardial infarction ([place])   . Paralyzed  hemidiaphragm    Right  . Paralyzed hemidiaphragm    Chronic  . Statin intolerance    Elevated CPK in the past    Patient Active Problem List   Diagnosis Date Noted  . Coronary artery disease 08/24/2017  . Unstable angina ([place]) 07/29/2017  . CAP (community acquired pneumonia) 02/05/2017  . Solitary pulmonary nodule 02/06/2016  . Chronic rhinitis 02/01/2013  . Low back pain   . CAD (coronary artery disease)   . GERD (gastroesophageal reflux disease)   . Cervical disc  disease   . BPH (benign prostatic hypertrophy)   . Paralyzed hemidiaphragm   . Statin intolerance   . Carotid artery disease ([place])   . Elevated CPK   . Fatigue   . Ejection fraction   . LEG CRAMPS 11/21/2010  . Hyperlipidemia 11/01/2008  . HYPERTENSION, BENIGN 11/01/2008  . COPD (chronic obstructive pulmonary disease) ([place]) 07/14/2007  . DOE (dyspnea on exertion) 07/14/2007    Past Surgical History:  Procedure Laterality Date  . BREAST SURGERY     right breast removed  . CERVICAL DISC SURGERY    . CORONARY ARTERY BYPASS GRAFT N/A 08/24/2017   Procedure: CORONARY ARTERY BYPASS GRAFTING times four using left internal mammary artery and bilateral saphenous vein, using endoscope. TEE;  Surgeon: Grace Isaac, MD;  Location: [place];  Service: Open Heart Surgery;  Laterality: N/A;  . LEFT HEART CATH AND CORONARY ANGIOGRAPHY N/A 07/29/2017   Procedure: LEFT HEART CATH AND CORONARY ANGIOGRAPHY;  Surgeon: Nelva Bush, MD;  Location: [facility];  Service: Cardiovascular;  Laterality: N/A;  . SKIN CANCER EXCISION    . TEE WITHOUT CARDIOVERSION N/A 08/24/2017   Procedure: TRANSESOPHAGEAL ECHOCARDIOGRAM (TEE);  Surgeon: Grace Isaac, MD;  Location: [facility];  Service: Open Heart Surgery;  Laterality: N/A;        Home Medications    Prior to Admission medications   Medication Sig Start Date End Date Taking? Authorizing Provider  albuterol (PROVENTIL HFA;VENTOLIN HFA) 108 (90 BASE) MCG/ACT inhaler Inhale 2 puffs into the lungs every 6 (six) hours as needed for wheezing or shortness of breath. 08/13/15   Juanito Doom, MD  aspirin EC 81 MG tablet Take 81 mg by mouth daily.    [provider]  calcium carbonate (TUMS - DOSED IN MG ELEMENTAL CALCIUM) 500 MG chewable tablet Chew 1 tablet by mouth daily.    [provider]  finasteride (PROSCAR) 5 MG tablet Take 5 mg by mouth 2 (two) times daily.  06/03/11   [provider]  fluticasone  furoate-vilanterol (BREO ELLIPTA) 200-25 MCG/INH AEPB Inhale 1 puff into the lungs daily. 06/27/18   Juanito Doom, MD  fluticasone furoate-vilanterol (BREO ELLIPTA) 200-25 MCG/INH AEPB Inhale 1 puff into the lungs daily. 08/15/18   Juanito Doom, MD  furosemide (LASIX) 40 MG tablet Take 1 tablet (40 mg total) by mouth daily. 09/30/17 01/20/18  Isaiah Serge, NP  ipratropium (ATROVENT) 0.03 % nasal spray Place 2 sprays into both nostrils every 12 (twelve) hours. 08/15/18   Juanito Doom, MD  IRON PO Take by mouth as directed.    [provider]  levalbuterol Penne Lash) 0.63 MG/3ML nebulizer solution Take 3 mLs (0.63 mg total) by nebulization every 4 (four) hours as needed for wheezing or shortness of breath. 05/04/18   Juanito Doom, MD  montelukast (SINGULAIR) 10 MG tablet Take 1 tablet (10 mg total) by mouth at bedtime. 09/22/18   Juanito Doom,  MD  Multiple Vitamins-Minerals (ICAPS PO) Take 1 capsule by mouth daily.    [provider]  potassium chloride SA (K-DUR,KLOR-CON) 20 MEQ tablet Take 1 tablet (20 mEq total) by mouth daily. 09/30/17   Isaiah Serge, NP  sodium chloride HYPERTONIC 3 % nebulizer solution Take by nebulization 2 (two) times daily. Patient not taking: Reported on 08/15/2018 11/10/17   Juanito Doom, MD  tamsulosin (FLOMAX) 0.4 MG CAPS capsule Take 0.4 mg by mouth 2 (two) times daily. For urinary symptoms     [provider]    Family History Family History  Problem Relation Age of Onset  . Diabetes Mother   . Other Father   . Diabetes Sister   . Kidney disease Brother   . Cancer Brother   . Other Sister     Social History Social History   Tobacco Use  . Smoking status: Former Smoker    Packs/day: 3.00    Years: 40.00    Pack years: 120.00    Types: Cigarettes    Last attempt to quit: 09/15/1975    Years since quitting: 43.0  . Smokeless tobacco: Never Used  Substance Use Topics  . Alcohol use: No     Frequency: Never  . Drug use: No     Allergies   Statins and Repatha [evolocumab]   Review of Systems Review of Systems  All other systems reviewed and are negative.    Physical Exam Updated Vital Signs BP 122/78 (BP Location: Right Arm)   Pulse 98   Temp 98 F (36.7 C) (Oral)   Resp (!) 121   SpO2 99%   Physical Exam Vitals signs and nursing note reviewed.  Constitutional:      Appearance: He is well-developed and normal weight.  HENT:     Head: Normocephalic.  Eyes:     Extraocular Movements: Extraocular movements intact.  Cardiovascular:     Rate and Rhythm: Normal rate and regular rhythm.  Pulmonary:     Effort: Pulmonary effort is normal.     Breath sounds: Normal breath sounds.  Abdominal:     General: Bowel sounds are decreased. There is distension. There is no abdominal bruit.     Palpations: Abdomen is soft.     Tenderness: There is generalized abdominal tenderness.  Skin:    General: Skin is warm and dry.     Capillary Refill: Capillary refill takes less than 2 seconds.  Neurological:     General: No focal deficit present.     Mental Status: He is alert. He is disoriented.      ED Treatments / Results  Labs (all labs ordered are listed, but only abnormal results are displayed) Labs Reviewed - No data to display  EKG EKG Interpretation  Date/Time:  Monday October 03 2018 13:45:26 EST Ventricular Rate:  102 PR Interval:    QRS Duration: 84 QT Interval:  351 QTC Calculation: 458 R Axis:   19 Text Interpretation:  Sinus tachycardia Anteroseptal infarct, old Nonspecific T abnormalities, lateral leads Confirmed by Pattricia Boss [phone]) on 10/03/2018 1:48:09 PM   Radiology Ct Abdomen Pelvis W Contrast  Result Date: 10/03/2018 CLINICAL DATA:  Generalized abdominal pain. EXAM: CT ABDOMEN AND PELVIS WITH CONTRAST TECHNIQUE: Multidetector CT imaging of the abdomen and pelvis was performed using the standard protocol following bolus administration  of intravenous contrast. CONTRAST:  155mL OMNIPAQUE IOHEXOL 300 MG/ML  SOLN COMPARISON:  CT scan 09/26/2018 FINDINGS: Lower chest: Bibasilar scarring changes. The heart is  normal in size. Coronary artery calcifications are noted. Hepatobiliary: Small hepatic cysts are stable. No worrisome hepatic lesions. Gallbladder is slightly distended and there are calcified gallstones but no CT findings suggestive of acute cholecystitis. No common bile duct dilatation. Pancreas: No mass, inflammation or ductal dilatation. Spleen: Normal size.  No focal lesions. Adrenals/Urinary Tract: The adrenal glands and kidneys are unremarkable and stable. The bladder appears normal. Stomach/Bowel: The stomach and duodenum are unremarkable. There are dilated small bowel loops with air-fluid levels consistent with obstruction. There appears to be a transition in the mid abdomen involving the mid distal ileum. The patient does not have any stated history of prior abdominal surgery to suggest adhesions but I do not see any evidence of mass or internal hernia. The colon is relatively decompressed. Moderate colonic diverticulosis. No free air is identified. Small amount of free pelvic fluid. Vascular/Lymphatic: Stable atherosclerotic calcifications involving the aorta iliac arteries. 2.7 cm infrarenal abdominal aortic aneurysm is stable. No mesenteric or retroperitoneal mass or adenopathy. Reproductive: Enlarged prostate gland with median lobe hypertrophy impressing on the base of the bladder. The seminal vesicles are grossly normal. Other: Small amount of free pelvic fluid. Small amount of fluid also around the liver but no free air. Musculoskeletal: No significant bony findings. Advanced degenerative changes involving the spine are noted. IMPRESSION: 1. CT findings suggest a small bowel obstruction with mid distal ileal transition point. No obvious cause. No mass is identified. No stated history of prior abdominal surgery making adhesions  unlikely. 2. Cholelithiasis without definite CT findings for acute cholecystitis. 3. Moderate prostate gland enlargement with median lobe hypertrophy impressing on the base of the bladder. 4. Colonic diverticulosis without findings for acute diverticulitis. Electronically Signed   By: Marijo Sanes M.D.   On: 10/03/2018 16:03    Procedures Procedures (including critical care time)  Medications Ordered in ED Medications - No data to display   Initial Impression / Assessment and Plan / ED Course  I have reviewed the triage vital signs and the nursing notes.  Pertinent labs & imaging results that were available during my care of the patient were reviewed by me and considered in my medical decision making (see chart for details).  Clinical Course as of Oct 04 1599  Mon Oct 03, 2018  1559 Creatinine(!): 1.95 [DR]    Clinical Course User Index [DR] Pattricia Boss, MD   85 year old male presents today with 8 days of abdominal pain nausea and vomiting.  Plain films at outside hospital suggestive of early small bowel obstruction.  Patient has CT here 1- sbo 2-cholelithiasis without evidence of acute cholecystitis 3-AKI- creatinine 1.95 with last 1.15  Discussed with general surgery, Dr. Dema Severin And internal medicine teaching service  Final Clinical Impressions(s) / ED Diagnoses   Final diagnoses:  SBO (small bowel obstruction) [facility])    ED Discharge Orders    None       Pattricia Boss, MD 10/03/18 1702

## 2018-10-03 NOTE — ED Notes (Signed)
Patient transported to CT 

## 2018-10-03 NOTE — H&P (Signed)
Date: 10/03/2018               Patient Name:  Tim [name] MRN: [id number]  DOB: 09-01-1929 Age / Sex: 85 y.o., male   PCP: Raelene Bott, MD         Medical Service: Internal Medicine Teaching Service         Attending Physician: Dr. Annia Belt, MD    First Contact: Dr. Laural Golden Pager: [phone]  Second Contact: Dr. Tarri Abernethy Pager: [phone]       After Hours (After 5p/  First Contact Pager: [phone]  weekends / holidays): Second Contact Pager: [phone]   Chief Complaint: Abdominal pain, n/v  History of Present Illness: Mr. [name]. Talent is an 85 y.o male with hypertension, BPH, PVD, CAD with a history of MI,  presenting with abdominal pain, nausea and vomiting. The pain started on 1/11 as an ache in the lower abdomen that radiated to the back. This progressed to more severe pain with N/V. He was evaluated in an urgent care on 1/12. At that point the patient had a CT abdomen that illustrated dilated fluid-filled distal small bowel with bowel wall thickening, mesenteric edema, and free fluid in the abdomen and pelvis. This was felt to be secondary to enteritis and he was discharged home. He then followed up with his PCP and his symptoms seemed to improve. However, over the course of the next week his symptoms seemed to come and go. He noted diarrhea during this time in addition to the N/V. He continues to have daily bowel movements and occasional uncontrolled N/V with any type of PO intake. He saw his PCP today who got an image of his abdomen that illustrated gallstones and a bowel obstruction. He was subsequently sent to the ED for further evaluation. He denies hematemesis, hematochezia, fevers, chills, SOB, CP, RUQ pain. He has never had any abdominal surgeries.   ED course: In the ED he was found to be tachycardic but otherwise hemodynamically stable. UA showed 30 protein. WBC 12.9, Hgb 10.3. Cr 1.95. CT abdomen showed small bowel obstruction with mid distal ileal transition  point, no obvious cause, no mass identified. Cholelithiasis without definite findings for acute cholecystitis.   Meds:  Current Meds  Medication Sig  . acetaminophen (TYLENOL) 500 MG tablet Take 500 mg by mouth every 4 (four) hours as needed for mild pain.  [name] albuterol (PROVENTIL HFA;VENTOLIN HFA) 108 (90 BASE) MCG/ACT inhaler Inhale 2 puffs into the lungs every 6 (six) hours as needed for wheezing or shortness of breath.  [name] aspirin EC 81 MG tablet Take 81 mg by mouth daily.  [name] doxycycline (VIBRAMYCIN) 50 MG capsule Take 50 mg by mouth daily.   . finasteride (PROSCAR) 5 MG tablet Take 10 mg by mouth daily.   . fluticasone (FLONASE) 50 MCG/ACT nasal spray Place 2 sprays into both nostrils daily as needed for allergies or rhinitis.  . fluticasone furoate-vilanterol (BREO ELLIPTA) 200-25 MCG/INH AEPB Inhale 1 puff into the lungs daily.  . furosemide (LASIX) 40 MG tablet Take 1 tablet (40 mg total) by mouth daily. (Patient taking differently: Take 40 mg by mouth daily as needed for fluid. )  . ipratropium (ATROVENT) 0.03 % nasal spray Place 2 sprays into both nostrils every 12 (twelve) hours.  [name] levalbuterol (XOPENEX) 0.63 MG/3ML nebulizer solution Take 3 mLs (0.63 mg total) by nebulization every 4 (four) hours as needed for wheezing or shortness of breath.  . montelukast (SINGULAIR) 10 MG  tablet Take 1 tablet (10 mg total) by mouth at bedtime.  [name] omeprazole (PRILOSEC) 40 MG capsule Take 40 mg by mouth 2 (two) times daily.  . potassium chloride SA (K-DUR,KLOR-CON) 20 MEQ tablet Take 1 tablet (20 mEq total) by mouth daily.  . tamsulosin (FLOMAX) 0.4 MG CAPS capsule Take 0.4 mg by mouth 2 (two) times daily. For urinary symptoms    Allergies: Allergies as of 10/03/2018 - Review Complete 10/03/2018  Allergen Reaction Noted  . Statins Other (See Comments) 05/10/2015  . Repatha [evolocumab]  04/12/2018   Past Medical History:  Diagnosis Date  . AAA (abdominal aortic aneurysm) (HCC)    3.2 cm  07/2016; 2-3 year f/u recommended  . Asthma   . BPH (benign prostatic hypertrophy)   . CAD (coronary artery disease)    9 STEMI 2001, PCI, RCA, residual 100% circumflex  /   PCI for in-stent restenosis June, 2001  /  nuclear February, 2010, no ischemia  . Cancer ([place])    skin cancer removed  . Carotid artery disease ([place])    Doppler, June, 2011, 0-39% bilateral, pt denies  . Cervical disc disease    Status post neurosurgery  . Colon polyps   . Ejection fraction    EF 55-65%, echo, 2009  . Elevated CPK    Chronic mild CPK elevation  . Emphysema    Mild  . Fatigue    Morning fatigue, August, 2011  . GERD (gastroesophageal reflux disease)   . Headache    back of head  . Hyperlipidemia   . Hypertension   . Low back pain    February, 2013  . Myocardial infarction ([place])   . Paralyzed hemidiaphragm    Right  . Paralyzed hemidiaphragm    Chronic  . Statin intolerance    Elevated CPK in the past   Family History  Problem Relation Age of Onset  . Diabetes Mother   . Other Father   . Diabetes Sister   . Kidney disease Brother   . Cancer Brother   . Other Sister    Social History: Lives with his youngest son and daughter-in-law. He does all his ADLs independently and is typically very active. He is a former smoker stating he use to "smoke anything I could get ahold of." Denies the use of EtOH or other illicit substances.   Review of Systems: A complete ROS was negative except as per HPI.   Physical Exam: Blood pressure (!) 116/59, pulse 97, temperature 98 F (36.7 C), temperature source Oral, resp. rate 20, height 5\' 10"  (1.778 m), weight 76.7 kg, SpO2 97 %.  Physical Exam  Constitutional: He is oriented to person, place, and time and well-developed, well-nourished, and in no distress.  Cardiovascular: Regular rhythm and normal heart sounds. Tachycardia present.  No murmur heard. Pulmonary/Chest: Effort normal and breath sounds normal. No respiratory distress. He has no  wheezes. He has no rales.  Abdominal: Soft. He exhibits no distension. Bowel sounds are absent. There is abdominal tenderness in the epigastric area and periumbilical area. There is no guarding.  Musculoskeletal:        General: Edema present.     Comments: Right LE pitting edema > left   Neurological: He is alert and oriented to person, place, and time.  Skin: Skin is warm and dry.  Psychiatric: Mood, memory, affect and judgment normal.    EKG: personally reviewed my interpretation is sinus tachycardia   CT abdomen pelvis IMPRESSION: 1. CT findings suggest  a small bowel obstruction with mid distal ileal transition point. No obvious cause. No mass is identified. No stated history of prior abdominal surgery making adhesions unlikely. 2. Cholelithiasis without definite CT findings for acute cholecystitis. 3. Moderate prostate gland enlargement with median lobe hypertrophy impressing on the base of the bladder. 4. Colonic diverticulosis without findings for acute diverticulitis.  Assessment & Plan by Problem: Active Problems:   SBO (small bowel obstruction) ([place])  Mr. Willow Reczek. Minehart is an 85 y.o male with hypertension, BPH, PVD, CAD with a history of MI,  presenting with abdominal pain, nausea and vomiting. Initially started on 1/11 and CT on 1/12 showed dilated fluid-filled distal small bowel with bowel wall thickening, mesenteric edema, and free fluid in the abdomen and pelvis. This was felt to be secondary to enteritis and he was discharged home. Symptoms have been intermittent since and he saw his PCP today who got an image of his abdomen that illustrated gallstones and a bowel obstruction. He was subsequently sent to the ED for further evaluation.   Small bowel obstruction - General surgery on board, appreciate recommendations - NPO - Zofran for nausea  - NS with KCl 20 meq at 50 ml/hr for ten hours  AKI - Cr 1.95 on admission, baseline 1.2 - Most likely 2/2 dehydration in  the setting of vomiting and loose stools - Avoid nephrotoxic agents - Trend bmps  CAD  Hx of MI - continue aspirin 81 mg daily  COPD  Asthma - home medications include albuterol prn, breo ellipita daily, ipratropium, montelukas, and levalbuterol nebulizer as needed   BPH - continue tamsulosin and finasteride   GERD - continue pantoprazole 40 mg   Diet: NPO DVT prophylaxis: Lovenox Full Code  Dispo: Admit patient to Inpatient with expected length of stay greater than 2 midnights.  SignedMike Craze, DO 10/03/2018, 5:38 PM  Pager: [phone]

## 2018-10-03 NOTE — ED Triage Notes (Signed)
Sent here from dr in [place] for bowel obstruction and gallstones, has been nauseeated and having pain for 1 week. Seen at [facility] last Sunday for same. Vomited last night, poor appetite for 1 week.

## 2018-10-03 NOTE — Consult Note (Signed)
CC: Consult by Dr. Jeanell Sparrow for possible pSBO  HPI: Tim [name] is an 85 y.o. male with hx of HTN, CHF, CAD, PAD, emphysema, paralyzed right diaphragm,  whom presented to the ED today with a 8d hx of intermittent nausea/emesis. He was seen in [facility] ED last Sunday (8d ago) with what he thought was a gastroenteritis. He underwent evaluation and CT scan which demonstrated dilated fluid-filled distal small bowel with bowel wall thickening and mesenteric edema and free fluid in the abdomen and pelvis. Changes thought to be 2/2 enteritis. He was discharged. His sxs continued intermittently. He reports having had gas and BM today. He last had nausea/emesis last night. None today. He denies any fever/chills.  Past Medical History:  Diagnosis Date  . AAA (abdominal aortic aneurysm) (HCC)    3.2 cm 07/2016; 2-3 year f/u recommended  . Asthma   . BPH (benign prostatic hypertrophy)   . CAD (coronary artery disease)    9 STEMI 2001, PCI, RCA, residual 100% circumflex  /   PCI for in-stent restenosis June, 2001  /  nuclear February, 2010, no ischemia  . Cancer ([place])    skin cancer removed  . Carotid artery disease ([place])    Doppler, June, 2011, 0-39% bilateral, pt denies  . Cervical disc disease    Status post neurosurgery  . Colon polyps   . Ejection fraction    EF 55-65%, echo, 2009  . Elevated CPK    Chronic mild CPK elevation  . Emphysema    Mild  . Fatigue    Morning fatigue, August, 2011  . GERD (gastroesophageal reflux disease)   . Headache    back of head  . Hyperlipidemia   . Hypertension   . Low back pain    February, 2013  . Myocardial infarction ([place])   . Paralyzed hemidiaphragm    Right  . Paralyzed hemidiaphragm    Chronic  . Statin intolerance    Elevated CPK in the past    Past Surgical History:  Procedure Laterality Date  . BREAST SURGERY     right breast removed  . CERVICAL DISC SURGERY    . CORONARY ARTERY BYPASS GRAFT N/A 08/24/2017   Procedure: CORONARY  ARTERY BYPASS GRAFTING times four using left internal mammary artery and bilateral saphenous vein, using endoscope. TEE;  Surgeon: Grace Isaac, MD;  Location: [facility];  Service: Open Heart Surgery;  Laterality: N/A;  . LEFT HEART CATH AND CORONARY ANGIOGRAPHY N/A 07/29/2017   Procedure: LEFT HEART CATH AND CORONARY ANGIOGRAPHY;  Surgeon: Nelva Bush, MD;  Location: [facility];  Service: Cardiovascular;  Laterality: N/A;  . SKIN CANCER EXCISION    . TEE WITHOUT CARDIOVERSION N/A 08/24/2017   Procedure: TRANSESOPHAGEAL ECHOCARDIOGRAM (TEE);  Surgeon: Grace Isaac, MD;  Location: [facility];  Service: Open Heart Surgery;  Laterality: N/A;    Family History  Problem Relation Age of Onset  . Diabetes Mother   . Other Father   . Diabetes Sister   . Kidney disease Brother   . Cancer Brother   . Other Sister     Social:  reports that he quit smoking about 43 years ago. His smoking use included cigarettes. He has a 120.00 pack-year smoking history. He has never used smokeless tobacco. He reports that he does not drink alcohol or use drugs.  Allergies:  Allergies  Allergen Reactions  . Statins Other (See Comments)    CLASS EFFECT SEVERE LEG CRAMPS [MULTIPLE STATINS]  .  Repatha [Evolocumab]     Flu like symptoms    Medications: I have reviewed the patient's current medications.  Results for orders placed or performed during the hospital encounter of 10/03/18 (from the past 48 hour(s))  CBC with Differential/Platelet     Status: Abnormal   Collection Time: 10/03/18  1:58 PM  Result Value Ref Range   WBC 12.9 (H) 4.0 - 10.5 K/uL   RBC 4.12 (L) 4.22 - 5.81 MIL/uL   Hemoglobin 10.3 (L) 13.0 - 17.0 g/dL   HCT 33.8 (L) 39.0 - 52.0 %   MCV 82.0 80.0 - 100.0 fL   MCH 25.0 (L) 26.0 - 34.0 pg   MCHC 30.5 30.0 - 36.0 g/dL   RDW 17.0 (H) 11.5 - 15.5 %   Platelets 328 150 - 400 K/uL   nRBC 0.0 0.0 - 0.2 %   Neutrophils Relative % 79 %   Neutro Abs 10.2 (H) 1.7 - 7.7 K/uL    Lymphocytes Relative 10 %   Lymphs Abs 1.3 0.7 - 4.0 K/uL   Monocytes Relative 9 %   Monocytes Absolute 1.2 (H) 0.1 - 1.0 K/uL   Eosinophils Relative 1 %   Eosinophils Absolute 0.1 0.0 - 0.5 K/uL   Basophils Relative 0 %   Basophils Absolute 0.0 0.0 - 0.1 K/uL   Immature Granulocytes 1 %   Abs Immature Granulocytes 0.07 0.00 - 0.07 K/uL    Comment: Performed at [facility], [address], [place], [place] [address]  Comprehensive metabolic panel     Status: Abnormal   Collection Time: 10/03/18  1:58 PM  Result Value Ref Range   Sodium 140 135 - 145 mmol/L   Potassium 4.1 3.5 - 5.1 mmol/L   Chloride 104 98 - 111 mmol/L   CO2 24 22 - 32 mmol/L   Glucose, Bld 124 (H) 70 - 99 mg/dL   BUN 43 (H) 8 - 23 mg/dL   Creatinine, Ser 1.95 (H) 0.61 - 1.24 mg/dL   Calcium 8.5 (L) 8.9 - 10.3 mg/dL   Total Protein 6.2 (L) 6.5 - 8.1 g/dL   Albumin 2.9 (L) 3.5 - 5.0 g/dL   AST 17 15 - 41 U/L   ALT 18 0 - 44 U/L   Alkaline Phosphatase 68 38 - 126 U/L   Total Bilirubin 1.0 0.3 - 1.2 mg/dL   GFR calc non Af Amer 30 (L) >60 mL/min   GFR calc Af Amer 35 (L) >60 mL/min   Anion gap 12 5 - 15    Comment: Performed at [facility], [place] [address], [place], [place] [address]  Urinalysis, Routine w reflex microscopic     Status: Abnormal   Collection Time: 10/03/18  4:27 PM  Result Value Ref Range   Color, Urine YELLOW YELLOW   APPearance HAZY (A) CLEAR   Specific Gravity, Urine 1.024 1.005 - 1.030   pH 5.0 5.0 - 8.0   Glucose, UA NEGATIVE NEGATIVE mg/dL   Hgb urine dipstick NEGATIVE NEGATIVE   Bilirubin Urine NEGATIVE NEGATIVE   Ketones, ur NEGATIVE NEGATIVE mg/dL   Protein, ur 30 (A) NEGATIVE mg/dL   Nitrite NEGATIVE NEGATIVE   Leukocytes, UA NEGATIVE NEGATIVE   RBC / HPF 0-5 0 - 5 RBC/hpf   WBC, UA 0-5 0 - 5 WBC/hpf   Bacteria, UA MANY (A) NONE SEEN   Squamous Epithelial / LPF 0-5 0 - 5   Mucus PRESENT    Hyaline Casts, UA PRESENT  Comment: Performed at [facility], [place] [address], [place], [place] [address]    Ct Abdomen Pelvis W Contrast  Result Date: 10/03/2018 CLINICAL DATA:  Generalized abdominal pain. EXAM: CT ABDOMEN AND PELVIS WITH CONTRAST TECHNIQUE: Multidetector CT imaging of the abdomen and pelvis was performed using the standard protocol following bolus administration of intravenous contrast. CONTRAST:  172mL OMNIPAQUE IOHEXOL 300 MG/ML  SOLN COMPARISON:  CT scan 09/26/2018 FINDINGS: Lower chest: Bibasilar scarring changes. The heart is normal in size. Coronary artery calcifications are noted. Hepatobiliary: Small hepatic cysts are stable. No worrisome hepatic lesions. Gallbladder is slightly distended and there are calcified gallstones but no CT findings suggestive of acute cholecystitis. No common bile duct dilatation. Pancreas: No mass, inflammation or ductal dilatation. Spleen: Normal size.  No focal lesions. Adrenals/Urinary Tract: The adrenal glands and kidneys are unremarkable and stable. The bladder appears normal. Stomach/Bowel: The stomach and duodenum are unremarkable. There are dilated small bowel loops with air-fluid levels consistent with obstruction. There appears to be a transition in the mid abdomen involving the mid distal ileum. The patient does not have any stated history of prior abdominal surgery to suggest adhesions but I do not see any evidence of mass or internal hernia. The colon is relatively decompressed. Moderate colonic diverticulosis. No free air is identified. Small amount of free pelvic fluid. Vascular/Lymphatic: Stable atherosclerotic calcifications involving the aorta iliac arteries. 2.7 cm infrarenal abdominal aortic aneurysm is stable. No mesenteric or retroperitoneal mass or adenopathy. Reproductive: Enlarged prostate gland with median lobe hypertrophy impressing on the base of the bladder. The seminal vesicles are grossly normal. Other: Small amount of free pelvic fluid. Small amount of fluid also around the liver but no  free air. Musculoskeletal: No significant bony findings. Advanced degenerative changes involving the spine are noted. IMPRESSION: 1. CT findings suggest a small bowel obstruction with mid distal ileal transition point. No obvious cause. No mass is identified. No stated history of prior abdominal surgery making adhesions unlikely. 2. Cholelithiasis without definite CT findings for acute cholecystitis. 3. Moderate prostate gland enlargement with median lobe hypertrophy impressing on the base of the bladder. 4. Colonic diverticulosis without findings for acute diverticulitis. Electronically Signed   By: Marijo Sanes M.D.   On: 10/03/2018 16:03    ROS - all of the below systems have been reviewed with the patient and positives are indicated with bold text General: chills, fever or night sweats Eyes: blurry vision or double vision ENT: epistaxis or sore throat Allergy/Immunology: itchy/watery eyes or nasal congestion Hematologic/Lymphatic: bleeding problems, blood clots or swollen lymph nodes Endocrine: temperature intolerance or unexpected weight changes Breast: new or changing breast lumps or nipple discharge Resp: cough, shortness of breath, or wheezing CV: chest pain or dyspnea on exertion GI: as per HPI GU: dysuria, trouble voiding, or hematuria MSK: joint pain or joint stiffness Neuro: TIA or stroke symptoms Derm: pruritus and skin lesion changes Psych: anxiety and depression  PE Blood pressure (!) 116/59, pulse 97, temperature 98 F (36.7 C), temperature source Oral, resp. rate 20, height 5\' 10"  (1.778 m), weight 76.7 kg, SpO2 97 %. Constitutional: NAD; conversant; no deformities Eyes: Moist conjunctiva; no lid lag; anicteric; PERRL Neck: Trachea midline; no thyromegaly Lungs: Normal respiratory effort; no tactile fremitus CV: RRR; no palpable thrills; no pitting edema GI: Abd soft, nontender, mildly distended; no palpable hepatosplenomegaly. No rebound. No guarding. MSK: Normal gait;  no clubbing/cyanosis Psychiatric: Appropriate affect; alert and oriented x3 Lymphatic: No palpable cervical or  axillary lymphadenopathy  Results for orders placed or performed during the hospital encounter of 10/03/18 (from the past 48 hour(s))  CBC with Differential/Platelet     Status: Abnormal   Collection Time: 10/03/18  1:58 PM  Result Value Ref Range   WBC 12.9 (H) 4.0 - 10.5 K/uL   RBC 4.12 (L) 4.22 - 5.81 MIL/uL   Hemoglobin 10.3 (L) 13.0 - 17.0 g/dL   HCT 33.8 (L) 39.0 - 52.0 %   MCV 82.0 80.0 - 100.0 fL   MCH 25.0 (L) 26.0 - 34.0 pg   MCHC 30.5 30.0 - 36.0 g/dL   RDW 17.0 (H) 11.5 - 15.5 %   Platelets 328 150 - 400 K/uL   nRBC 0.0 0.0 - 0.2 %   Neutrophils Relative % 79 %   Neutro Abs 10.2 (H) 1.7 - 7.7 K/uL   Lymphocytes Relative 10 %   Lymphs Abs 1.3 0.7 - 4.0 K/uL   Monocytes Relative 9 %   Monocytes Absolute 1.2 (H) 0.1 - 1.0 K/uL   Eosinophils Relative 1 %   Eosinophils Absolute 0.1 0.0 - 0.5 K/uL   Basophils Relative 0 %   Basophils Absolute 0.0 0.0 - 0.1 K/uL   Immature Granulocytes 1 %   Abs Immature Granulocytes 0.07 0.00 - 0.07 K/uL    Comment: Performed at [facility], [address] [place], [place] [address]  Comprehensive metabolic panel     Status: Abnormal   Collection Time: 10/03/18  1:58 PM  Result Value Ref Range   Sodium 140 135 - 145 mmol/L   Potassium 4.1 3.5 - 5.1 mmol/L   Chloride 104 98 - 111 mmol/L   CO2 24 22 - 32 mmol/L   Glucose, Bld 124 (H) 70 - 99 mg/dL   BUN 43 (H) 8 - 23 mg/dL   Creatinine, Ser 1.95 (H) 0.61 - 1.24 mg/dL   Calcium 8.5 (L) 8.9 - 10.3 mg/dL   Total Protein 6.2 (L) 6.5 - 8.1 g/dL   Albumin 2.9 (L) 3.5 - 5.0 g/dL   AST 17 15 - 41 U/L   ALT 18 0 - 44 U/L   Alkaline Phosphatase 68 38 - 126 U/L   Total Bilirubin 1.0 0.3 - 1.2 mg/dL   GFR calc non Af Amer 30 (L) >60 mL/min   GFR calc Af Amer 35 (L) >60 mL/min   Anion gap 12 5 - 15    Comment: Performed at [facility], [place] [address],  [place], [place] [address]  Urinalysis, Routine w reflex microscopic     Status: Abnormal   Collection Time: 10/03/18  4:27 PM  Result Value Ref Range   Color, Urine YELLOW YELLOW   APPearance HAZY (A) CLEAR   Specific Gravity, Urine 1.024 1.005 - 1.030   pH 5.0 5.0 - 8.0   Glucose, UA NEGATIVE NEGATIVE mg/dL   Hgb urine dipstick NEGATIVE NEGATIVE   Bilirubin Urine NEGATIVE NEGATIVE   Ketones, ur NEGATIVE NEGATIVE mg/dL   Protein, ur 30 (A) NEGATIVE mg/dL   Nitrite NEGATIVE NEGATIVE   Leukocytes, UA NEGATIVE NEGATIVE   RBC / HPF 0-5 0 - 5 RBC/hpf   WBC, UA 0-5 0 - 5 WBC/hpf   Bacteria, UA MANY (A) NONE SEEN   Squamous Epithelial / LPF 0-5 0 - 5   Mucus PRESENT    Hyaline Casts, UA PRESENT     Comment: Performed at [facility], [place] [address], [place], [place] [address]  Ct Abdomen Pelvis W Contrast  Result Date: 10/03/2018 CLINICAL DATA:  Generalized abdominal pain. EXAM: CT ABDOMEN AND PELVIS WITH CONTRAST TECHNIQUE: Multidetector CT imaging of the abdomen and pelvis was performed using the standard protocol following bolus administration of intravenous contrast. CONTRAST:  127mL OMNIPAQUE IOHEXOL 300 MG/ML  SOLN COMPARISON:  CT scan 09/26/2018 FINDINGS: Lower chest: Bibasilar scarring changes. The heart is normal in size. Coronary artery calcifications are noted. Hepatobiliary: Small hepatic cysts are stable. No worrisome hepatic lesions. Gallbladder is slightly distended and there are calcified gallstones but no CT findings suggestive of acute cholecystitis. No common bile duct dilatation. Pancreas: No mass, inflammation or ductal dilatation. Spleen: Normal size.  No focal lesions. Adrenals/Urinary Tract: The adrenal glands and kidneys are unremarkable and stable. The bladder appears normal. Stomach/Bowel: The stomach and duodenum are unremarkable. There are dilated small bowel loops with air-fluid levels consistent with obstruction. There appears to be a transition in the mid abdomen  involving the mid distal ileum. The patient does not have any stated history of prior abdominal surgery to suggest adhesions but I do not see any evidence of mass or internal hernia. The colon is relatively decompressed. Moderate colonic diverticulosis. No free air is identified. Small amount of free pelvic fluid. Vascular/Lymphatic: Stable atherosclerotic calcifications involving the aorta iliac arteries. 2.7 cm infrarenal abdominal aortic aneurysm is stable. No mesenteric or retroperitoneal mass or adenopathy. Reproductive: Enlarged prostate gland with median lobe hypertrophy impressing on the base of the bladder. The seminal vesicles are grossly normal. Other: Small amount of free pelvic fluid. Small amount of fluid also around the liver but no free air. Musculoskeletal: No significant bony findings. Advanced degenerative changes involving the spine are noted. IMPRESSION: 1. CT findings suggest a small bowel obstruction with mid distal ileal transition point. No obvious cause. No mass is identified. No stated history of prior abdominal surgery making adhesions unlikely. 2. Cholelithiasis without definite CT findings for acute cholecystitis. 3. Moderate prostate gland enlargement with median lobe hypertrophy impressing on the base of the bladder. 4. Colonic diverticulosis without findings for acute diverticulitis. Electronically Signed   By: Marijo Sanes M.D.   On: 10/03/2018 16:03   A/P: Tim [name] is an 85 y.o. male with HTN, CHF, CAD, PAD, emphysema, paralyzed right diaphragm with pSBO vs ileus  -He has been having bowel fxn; hopefully this represents a bad ileus following a gastroenteritis -Medicine is admitting him -We will follow -NPO, MIVF -Abdominal XR ordered for AM -I discussed all the above and overall plan with the patient and his family and answered their questions  Sharon Mt. Dema Severin, M.D. [facility]

## 2018-10-04 ENCOUNTER — Inpatient Hospital Stay (HOSPITAL_COMMUNITY): Payer: Medicare Other

## 2018-10-04 DIAGNOSIS — K8080 Other cholelithiasis without obstruction: Secondary | ICD-10-CM

## 2018-10-04 DIAGNOSIS — I10 Essential (primary) hypertension: Secondary | ICD-10-CM

## 2018-10-04 LAB — BASIC METABOLIC PANEL
Anion gap: 9 (ref 5–15)
BUN: 36 mg/dL — ABNORMAL HIGH (ref 8–23)
CO2: 23 mmol/L (ref 22–32)
Calcium: 8.4 mg/dL — ABNORMAL LOW (ref 8.9–10.3)
Chloride: 109 mmol/L (ref 98–111)
Creatinine, Ser: 1.47 mg/dL — ABNORMAL HIGH (ref 0.61–1.24)
GFR calc Af Amer: 49 mL/min — ABNORMAL LOW (ref >60)
GFR calc non Af Amer: 42 mL/min — ABNORMAL LOW (ref >60)
Glucose, Bld: 96 mg/dL (ref 70–99)
Potassium: 3.7 mmol/L (ref 3.5–5.1)
Sodium: 141 mmol/L (ref 135–145)

## 2018-10-04 LAB — CBC
HCT: 32.3 % — ABNORMAL LOW (ref 39.0–52.0)
Hemoglobin: 9.9 g/dL — ABNORMAL LOW (ref 13.0–17.0)
MCH: 25.2 pg — ABNORMAL LOW (ref 26.0–34.0)
MCHC: 30.7 g/dL (ref 30.0–36.0)
MCV: 82.2 fL (ref 80.0–100.0)
Platelets: 305 10*3/uL (ref 150–400)
RBC: 3.93 MIL/uL — ABNORMAL LOW (ref 4.22–5.81)
RDW: 17.1 % — ABNORMAL HIGH (ref 11.5–15.5)
WBC: 10 10*3/uL (ref 4.0–10.5)
nRBC: 0 % (ref 0.0–0.2)

## 2018-10-04 MED ORDER — LACTATED RINGERS IV SOLN
INTRAVENOUS | Status: DC
  Administered 2018-10-04: 08:00:00 via INTRAVENOUS

## 2018-10-04 MED ORDER — ORAL CARE MOUTH RINSE: 15.0000 mL | Freq: Two times a day (BID) | OROMUCOSAL | Status: DC

## 2018-10-04 MED ORDER — CHLORHEXIDINE GLUCONATE 0.12 % MT SOLN
15.0000 mL | Freq: Two times a day (BID) | OROMUCOSAL | Status: DC
  Administered 2018-10-04 (×2): 15 mL via OROMUCOSAL
  Filled 2018-10-04 (×2): qty 15

## 2018-10-04 NOTE — Progress Notes (Addendum)
  Date: 10/04/2018  Patient name: Tim [name]  Medical record number: [id number]  Date of birth: [birth year]   I have seen and evaluated this patient and I have discussed the plan of care with the house staff. Please see their note for complete details. I concur with their findings with the following additions/corrections:   85 year old man admitted with partial SBO after recent symptoms consistent with gastroenteritis.  Seen together with the team on rounds this morning.  He reported overall improvement in his symptoms and is now having bowel movements and passing gas.  He still does not really have an appetite.  Repeat plain film this morning shows interval decrease in the distention of his intestines.  On exam, his abdomen is soft and nontender with active bowel sounds.  Appreciate surgical team consultation.  Agree with advancing his diet to clear liquids and seeing how he tolerates it today.  He is enthusiastic about walking the halls a bit today, which should also help his bowel function.  At this point, it appears that his symptoms were likely caused by a postinfectious ileus, which hopefully is now resolving.  We will continue IV fluids until he is able to maintain his own hydration.  Gallstones were noted on his plain films.  He has no evidence of cholestasis and suspicion of gallstone ileus is relatively low.  As long as his symptoms continue to improve, no reason to intervene further on his gallstones at this time.  Lenice Pressman, M.D., Ph.D. 10/04/2018, 2:13 PM

## 2018-10-04 NOTE — Progress Notes (Signed)
   Subjective: No overnight events. Patient reports he feels much better than when he first presented. Denies any more nausea and vomiting. He is continuing to have loose stools x2. He had one "clumpy" BM. He does not have any appetite but is amenable to starting a clear liquid diet. 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   Objective:  Vital signs in last 24 hours: Vitals:   10/03/18 1700 10/03/18 1945 10/03/18 2042 10/04/18 0547  BP: (!) 116/59 115/64 117/74 (!) 111/54  Pulse: 97 100 (!) 113 (!) 101  Resp: 20 20 18 18   Temp:   98 F (36.7 C) (!) 97.5 F (36.4 C)  TempSrc:   Oral Oral  SpO2: 97% 94% 100% 97%  Weight:      Height:       Gen: comfortably sitting up in bed, nad Abdomen: mild TTP in all four quadrants, no distension, bowel sounds present   Assessment/Plan:  Active Problems:   SBO (small bowel obstruction) (HCC)  Mr. Tim Ralph. [name] is an 85 y.o male with hypertension, BPH, PVD, CAD with a history of MI,  presenting with abdominal pain, nausea and vomiting. Initially started on 1/11 and CT on 1/12 showed dilated fluid-filled distal small bowel with bowel wall thickening, mesenteric edema, and free fluid in the abdomen and pelvis. This was felt to be secondary to enteritis and he was discharged home. Symptoms have been intermittent since and he saw his PCP today who got an image of his abdomen that illustrated gallstones and a bowel obstruction. He was subsequently sent to the ED for further evaluation.   Small bowel obstruction - SBO vs ileus following a gastroenteritis  - Follow up abdominal xray showed a decrease in caliber of gas distended small bowel loops, small bowel wall now appear thickened compared to yesterday's consistent with inflammation - Advance to clear liquid diet - General surgery on board, appreciate recommendations - Zofran for nausea   AKI - Cr 1.47, baseline 1.2 - Most likely 2/2  dehydration in the setting of vomiting and loose stools - Avoid nephrotoxic agents - Trend bmps   Dispo: Anticipated discharge pending clinical improvement.   Mike Craze, DO 10/04/2018, 6:48 AM Pager: [phone]

## 2018-10-04 NOTE — Progress Notes (Signed)
Xray results called to Dr. Laural Golden, No new orders received at this time.  Tim [name]

## 2018-10-04 NOTE — Progress Notes (Signed)
[facility] Progress Note     Subjective: CC: mild abdominal pain Abdomen with mild generalized pain but patient reports he overall feels much improved. Had 2 BMs last night that were loose and is passing flatus. He feels less distended and less nauseated.   Objective: Vital signs in last 24 hours: Temp:  [97.5 F (36.4 C)-98 F (36.7 C)] 97.5 F (36.4 C) (01/21 0547) Pulse Rate:  [97-113] 101 (01/21 0547) Resp:  [17-121] 18 (01/21 0547) BP: (100-130)/(54-78) 111/54 (01/21 0547) SpO2:  [88 %-100 %] 97 % (01/21 0547) Weight:  [76.7 kg] 76.7 kg (01/20 1341) Last BM Date: 10/03/18  Intake/Output from previous day: 01/20 0701 - 01/21 0700 In: 758.6 [I.V.:258.6; IV Piggyback:500] Out: -  Intake/Output this shift: No intake/output data recorded.  PE: Gen:  Alert, NAD, pleasant Card:  Regular rate and rhythm Pulm:  Normal effort, clear to auscultation bilaterally Abd: Soft, mild generalized TTP, non-distended, bowel sounds present, no HSM Skin: warm and dry, no rashes  Psych: A&Ox3   Lab Results:  Recent Labs    10/03/18 1358 10/04/18 0627  WBC 12.9* 10.0  HGB 10.3* 9.9*  HCT 33.8* 32.3*  PLT 328 305   BMET Recent Labs    10/03/18 1358 10/04/18 0627  NA 140 141  K 4.1 3.7  CL 104 109  CO2 24 23  GLUCOSE 124* 96  BUN 43* 36*  CREATININE 1.95* 1.47*  CALCIUM 8.5* 8.4*   PT/INR No results for input(s): LABPROT, INR in the last 72 hours. CMP     Component Value Date/Time   NA 141 10/04/2018 0627   NA 142 02/04/2018 0926   K 3.7 10/04/2018 0627   CL 109 10/04/2018 0627   CO2 23 10/04/2018 0627   GLUCOSE 96 10/04/2018 0627   BUN 36 (H) 10/04/2018 0627   BUN 24 02/04/2018 0926   CREATININE 1.47 (H) 10/04/2018 0627   CALCIUM 8.4 (L) 10/04/2018 0627   PROT 6.2 (L) 10/03/2018 1358   ALBUMIN 2.9 (L) 10/03/2018 1358   AST 17 10/03/2018 1358   ALT 18 10/03/2018 1358   ALKPHOS 68 10/03/2018 1358   BILITOT 1.0 10/03/2018 1358   GFRNONAA 42 (L)  10/04/2018 0627   GFRAA 49 (L) 10/04/2018 0627   Lipase     Component Value Date/Time   LIPASE 31 12/27/2013 1300       Studies/Results: Dg Abd 1 View  Result Date: 10/04/2018 CLINICAL DATA:  85 year old male with mid abdominal pain for the past week. Subsequent encounter. EXAM: ABDOMEN - 1 VIEW COMPARISON:  10/03/2018 CT and plain film exam. FINDINGS: Although there has been a decrease in the caliber of gas distended small bowel loops currently measuring up to 4 cm versus prior 5.5 cm, the small bowel folds and small bowel wall now appear thickened compared to yesterday's exam. This suggests progressive small bowel inflammation. The possibility of free intraperitoneal air cannot be assessed on a supine view. Descending colon and sigmoid colon colonic diverticula. Residual contrast in bladder. Degenerative changes lumbar spine and left hip. Gallstones.  No discrete evidence to suggest gallstone ileus. IMPRESSION: Although there has been a decrease in the caliber of gas distended small bowel loops currently measuring up to 4 cm versus prior 5.5 cm, the small bowel folds and small bowel wall now appear thickened compared to yesterday's exam. This suggests progressive small bowel inflammation. These results will be called to the ordering clinician or representative by the Radiologist Assistant, and communication documented in the  PACS or zVision Dashboard. Electronically Signed   By: Genia Del M.D.   On: 10/04/2018 07:50   Ct Abdomen Pelvis W Contrast  Result Date: 10/03/2018 CLINICAL DATA:  Generalized abdominal pain. EXAM: CT ABDOMEN AND PELVIS WITH CONTRAST TECHNIQUE: Multidetector CT imaging of the abdomen and pelvis was performed using the standard protocol following bolus administration of intravenous contrast. CONTRAST:  19mL OMNIPAQUE IOHEXOL 300 MG/ML  SOLN COMPARISON:  CT scan 09/26/2018 FINDINGS: Lower chest: Bibasilar scarring changes. The heart is normal in size. Coronary artery  calcifications are noted. Hepatobiliary: Small hepatic cysts are stable. No worrisome hepatic lesions. Gallbladder is slightly distended and there are calcified gallstones but no CT findings suggestive of acute cholecystitis. No common bile duct dilatation. Pancreas: No mass, inflammation or ductal dilatation. Spleen: Normal size.  No focal lesions. Adrenals/Urinary Tract: The adrenal glands and kidneys are unremarkable and stable. The bladder appears normal. Stomach/Bowel: The stomach and duodenum are unremarkable. There are dilated small bowel loops with air-fluid levels consistent with obstruction. There appears to be a transition in the mid abdomen involving the mid distal ileum. The patient does not have any stated history of prior abdominal surgery to suggest adhesions but I do not see any evidence of mass or internal hernia. The colon is relatively decompressed. Moderate colonic diverticulosis. No free air is identified. Small amount of free pelvic fluid. Vascular/Lymphatic: Stable atherosclerotic calcifications involving the aorta iliac arteries. 2.7 cm infrarenal abdominal aortic aneurysm is stable. No mesenteric or retroperitoneal mass or adenopathy. Reproductive: Enlarged prostate gland with median lobe hypertrophy impressing on the base of the bladder. The seminal vesicles are grossly normal. Other: Small amount of free pelvic fluid. Small amount of fluid also around the liver but no free air. Musculoskeletal: No significant bony findings. Advanced degenerative changes involving the spine are noted. IMPRESSION: 1. CT findings suggest a small bowel obstruction with mid distal ileal transition point. No obvious cause. No mass is identified. No stated history of prior abdominal surgery making adhesions unlikely. 2. Cholelithiasis without definite CT findings for acute cholecystitis. 3. Moderate prostate gland enlargement with median lobe hypertrophy impressing on the base of the bladder. 4. Colonic  diverticulosis without findings for acute diverticulitis. Electronically Signed   By: Marijo Sanes M.D.   On: 10/03/2018 16:03    Anti-infectives: Anti-infectives (From admission, onward)   None       Assessment/Plan HTN CHF CAD PAD Emphysema Paralyzed R diaphragm  SBO vs ileus - abdominal film this AM with slight decrease in degree of distention in small bowel loops but progressive small bowel inflammation - WBC 10 from 12, patient afeb - bowel function overnight and abdominal exam very benign - start CLD today - mobilize  FEN: CLD, IVF VTE: SCDs, lovenox ID: no abx   LOS: 1 day    Brigid Re , [facility] 10/04/2018, 8:50 AM Pager: [phone] Consults: [phone] Mon-Fri 7:00 am-4:30 pm Sat-Sun 7:00 am-11:30 am

## 2018-10-05 ENCOUNTER — Inpatient Hospital Stay (HOSPITAL_COMMUNITY): Payer: Medicare Other

## 2018-10-05 LAB — BASIC METABOLIC PANEL
Anion gap: 8 (ref 5–15)
BUN: 24 mg/dL — ABNORMAL HIGH (ref 8–23)
CO2: 24 mmol/L (ref 22–32)
Calcium: 8.3 mg/dL — ABNORMAL LOW (ref 8.9–10.3)
Chloride: 108 mmol/L (ref 98–111)
Creatinine, Ser: 1.21 mg/dL (ref 0.61–1.24)
GFR calc Af Amer: >60 mL/min (ref >60)
GFR calc non Af Amer: 54 mL/min — ABNORMAL LOW (ref >60)
Glucose, Bld: 103 mg/dL — ABNORMAL HIGH (ref 70–99)
Potassium: 4 mmol/L (ref 3.5–5.1)
Sodium: 140 mmol/L (ref 135–145)

## 2018-10-05 MED ORDER — ENOXAPARIN SODIUM 40 MG/0.4ML ~~LOC~~ SOLN
40.0000 mg | SUBCUTANEOUS | Status: DC
  Administered 2018-10-05: 40 mg via SUBCUTANEOUS
  Filled 2018-10-05: qty 0.4

## 2018-10-05 NOTE — Plan of Care (Signed)

## 2018-10-05 NOTE — Progress Notes (Signed)
  Date: 10/05/2018  Patient name: Tim [name]  Medical record number: [id number]  Date of birth: 09/11/1929   I have seen and evaluated this patient and I have discussed the plan of care with the house staff. Please see their note for complete details. I concur with their findings with the following additions/corrections:   Symptoms improving, tolerated a clear liquid diet yesterday without vomiting.  Still having bowel movements and passing gas.  As soon as he is able to tolerate a regular diet, ready to go home, either later today or tomorrow.  At this point, likely diagnosis was postinfectious ileus after gastroenteritis.  Lenice Pressman, M.D., Ph.D. 10/05/2018, 5:22 PM

## 2018-10-05 NOTE — Progress Notes (Signed)
   Subjective: No overnight events. [name] reports that he feels well. He tolerated his liquid diet yesterday without nausea or vomiting. He feels hungry and ready to eat. He had bowel movements yesterday, but none yet today. He has been passing gas.  Objective:  Vital signs in last 24 hours: Vitals:   10/04/18 1344 10/04/18 2120 10/05/18 0536 10/05/18 0547  BP: 112/74 110/73 103/67   Pulse: (!) 103 (!) 113 (!) 108   Resp: 17 20 (!) 24   Temp: 97.7 F (36.5 C) 98.6 F (37 C)  98.5 F (36.9 C)  TempSrc: Oral Oral  Oral  SpO2: 97% 100% 97%   Weight:      Height:       Gen: elderly man, lying comfortably in bed, no distress Abd: Bowel sounds present. Soft, non-distended, non-tender.  Assessment/Plan:  Principal Problem:   SBO (small bowel obstruction) ([place])  [name] is an 85 y.o male with hypertension, BPH, PVD, CAD with a history of MI, presenting with abdominal pain, nausea and vomiting. Initially started on 1/11 and CT on 1/12 showeddilated fluid-filled distal small bowel with bowel wall thickening,mesenteric edema,and free fluid in the abdomen and pelvis. This was felt to be secondary to enteritis and he was discharged home.Symptoms have been intermittent since and he saw his PCP today who got an image of his abdomen that illustrated gallstones and a bowel obstruction.He was subsequently sent to the ED for further evaluation.  Small bowel obstruction - SBO vs ileus following a gastroenteritis  - Tolerating clear liquids, will advance to full liquid and continue to advance as tolerated  - General surgery on board, appreciate recommendations - Zofran for nausea  AKI - Cr has returned to baseline at 1.2 - Most likely 2/2 dehydration in the setting of vomiting and loose stools - Avoid nephrotoxic agents   Dispo: If patient is able to tolerate PO intake and has regular BM he will be stable for discharge.  Tim Craze, DO 10/05/2018, 6:50  AM Pager: [phone]

## 2018-10-05 NOTE — Final Consult Note (Signed)
Consultant Final Sign-Off Note    Assessment/Final recommendations  Tim [name] is a 85 y.o. male followed by me for ileus. Patient tolerating diet and having bowel function. Advance diet as tolerated. No indications for surgical intervention.    Wound care (if applicable): N/A   Diet at discharge: soft diet   Activity at discharge: per primary team   Follow-up appointment: primary care provider as needed   Pending results:  Unresulted Labs (From admission, onward)   None       Medication recommendations: colace prn   Other recommendations: None    Thank you for allowing [place] to participate in the care of your patient!  Please consult [place] again if you have further needs for your patient.  Claiborne Billings [place] 10/05/2018 9:34 AM    Subjective  Tolerating CLD. Having bowel function. Denies nausea. Having some very mild lower abdominal pain.    Objective  Vital signs in last 24 hours: Temp:  [97.7 F (36.5 C)-98.6 F (37 C)] 98.5 F (36.9 C) (01/22 0547) Pulse Rate:  [103-113] 108 (01/22 0536) Resp:  [17-24] 24 (01/22 0536) BP: (103-112)/(67-74) 103/67 (01/22 0536) SpO2:  [97 %-100 %] 98 % (01/22 0808)   Gen:  Alert, NAD, pleasant Card:  Regular rate and rhythm Pulm:  Normal effort, clear to auscultation bilaterally Abd: Soft, mild TTP in lower abdomen, non-distended, bowel sounds present, no HSM Skin: warm and dry, no rashes  Psych: A&Ox3   Pertinent labs and Studies: Recent Labs    10/03/18 1358 10/04/18 0627  WBC 12.9* 10.0  HGB 10.3* 9.9*  HCT 33.8* 32.3*   BMET Recent Labs    10/04/18 0627 10/05/18 0151  NA 141 140  K 3.7 4.0  CL 109 108  CO2 23 24  GLUCOSE 96 103*  BUN 36* 24*  CREATININE 1.47* 1.21  CALCIUM 8.4* 8.3*   No results for input(s): LABURIN in the last 72 hours. Results for orders placed or performed in visit on 11/10/17   MYCOBACTERIA, CULTURE, WITH FLUOROCHROME SMEAR     Status: None   Collection Time: 11/10/17 11:37 AM   Result Value Ref Range Status   MICRO NUMBER: [id number]  Final   SPECIMEN QUALITY: ADEQUATE  Final   Source: SPUTUM  Final   STATUS: FINAL  Final   SMEAR: No acid fast bacilli seen.  Final   RESULT:   Final    No Mycobacterium species isolated after 6 weeks incubation.  Respiratory or Resp and Sputum Culture     Status: Abnormal   Collection Time: 11/10/17 11:37 AM  Result Value Ref Range Status   MICRO NUMBER: [id number]  Final   SPECIMEN QUALITY: ADEQUATE  Final   Source SPUTUM  Final   STATUS: FINAL  Final   GRAM STAIN: Gram positive cocci in chains (A)  Final   ISOLATE 1: Pseudomonas aeruginosa (A)  Final      Susceptibility   Pseudomonas aeruginosa - RESPIRATORY/SPUTUM CULT NEGATIVE 1    CEFTAZIDIME 4 Sensitive     CEFEPIME 2 Sensitive     CIPROFLOXACIN <=0.25 Sensitive     LEVOFLOXACIN <=0.12 Sensitive     GENTAMICIN <=1 Sensitive     IMIPENEM 2 Sensitive     PIP/TAZO 16 Sensitive     TOBRAMYCIN* <=1 Sensitive      * Legend:S = Susceptible  I = IntermediateR = Resistant  NS = Not susceptible* = Not tested  NR = Not reported**NN = See antimicrobic comments  Fungus Culture &  Smear     Status: Abnormal   Collection Time: 11/10/17 11:37 AM  Result Value Ref Range Status   MICRO NUMBER: [id number]  Final   SPECIMEN QUALITY: ADEQUATE  Final   Source: SPUTUM  Final   STATUS: FINAL  Final   SMEAR: No fungal elements seen.  Final   CULTURE: yeast (A)  Final    Comment: Scant growth of Yeast present not further identified No additional fungi isolated after 4 weeks    Imaging: Dg Abd 2 Views  Result Date: 10/05/2018 CLINICAL DATA:  Follow-up ileus EXAM: ABDOMEN - 2 VIEW COMPARISON:  10/04/2018 FINDINGS: Scattered large and small bowel gas is noted. Mild small bowel dilatation is again seen but improved when compared with the prior study. Multiple gallstones are again noted. No free air is seen. Degenerative changes of the lumbar spine are noted. IMPRESSION: Improving small bowel  dilatation when compared with the prior exam. Electronically Signed   By: Inez Catalina M.D.   On: 10/05/2018 08:06

## 2018-10-06 DIAGNOSIS — K567 Ileus, unspecified: Principal | ICD-10-CM

## 2018-10-06 NOTE — Discharge Instructions (Signed)
Tim [name],  Please resume all of your home medications as prescribed. I want you to follow up with your PCP in one week to make sure your symptoms are continuing to improve.   Thank you for allowing [place] to be a part of your care!

## 2018-10-06 NOTE — Discharge Summary (Signed)
Name: Tim [name] MRN: [id number] DOB: April 24, 1929 85 y.o. PCP: Raelene Bott, MD  Date of Admission: 10/03/2018  1:26 PM Date of Discharge: 1/23/20201/23/2020 Attending Physician: No att. providers found  Discharge Diagnosis: 1. Postinfectious ileus 2. AKI  Discharge Medications: Allergies as of 10/06/2018      Reactions   Statins Other (See Comments)   CLASS EFFECT SEVERE LEG CRAMPS [MULTIPLE STATINS]   Repatha [evolocumab]    Flu like symptoms      Medication List    TAKE these medications   acetaminophen 500 MG tablet Commonly known as:  TYLENOL Take 500 mg by mouth every 4 (four) hours as needed for mild pain.   albuterol 108 (90 Base) MCG/ACT inhaler Commonly known as:  PROVENTIL HFA;VENTOLIN HFA Inhale 2 puffs into the lungs every 6 (six) hours as needed for wheezing or shortness of breath.   aspirin EC 81 MG tablet Take 81 mg by mouth daily.   doxycycline 50 MG capsule Commonly known as:  VIBRAMYCIN Take 50 mg by mouth daily.   finasteride 5 MG tablet Commonly known as:  PROSCAR Take 10 mg by mouth daily.   fluticasone 50 MCG/ACT nasal spray Commonly known as:  FLONASE Place 2 sprays into both nostrils daily as needed for allergies or rhinitis.   fluticasone furoate-vilanterol 200-25 MCG/INH Aepb Commonly known as:  BREO ELLIPTA Inhale 1 puff into the lungs daily. What changed:  Another medication with the same name was removed. Continue taking this medication, and follow the directions you see here.   furosemide 40 MG tablet Commonly known as:  LASIX Take 1 tablet (40 mg total) by mouth daily. What changed:    when to take this  reasons to take this   ICAPS PO Take 1 capsule by mouth daily.   ipratropium 0.03 % nasal spray Commonly known as:  ATROVENT Place 2 sprays into both nostrils every 12 (twelve) hours.   levalbuterol 0.63 MG/3ML nebulizer solution Commonly known as:  XOPENEX Take 3 mLs (0.63 mg total) by nebulization every 4  (four) hours as needed for wheezing or shortness of breath.   montelukast 10 MG tablet Commonly known as:  SINGULAIR Take 1 tablet (10 mg total) by mouth at bedtime.   omeprazole 40 MG capsule Commonly known as:  PRILOSEC Take 40 mg by mouth 2 (two) times daily.   potassium chloride SA 20 MEQ tablet Commonly known as:  K-DUR,KLOR-CON Take 1 tablet (20 mEq total) by mouth daily.   sodium chloride HYPERTONIC 3 % nebulizer solution Take by nebulization 2 (two) times daily.   tamsulosin 0.4 MG Caps capsule Commonly known as:  FLOMAX Take 0.4 mg by mouth 2 (two) times daily. For urinary symptoms       Disposition and follow-up:   Tim [name] was [facility] in Stable condition.  At the hospital follow up visit please address:  1.  Ileus- patient was admitted for post-infectious ileus which self resolved. Please make sure patient is continuing to have regular bowel movements   2.  Labs / imaging needed at time of follow-up: none  3.  Pending labs/ test needing follow-up: none  Follow-up Appointments: Patient to follow up with PCP in one week.  Hospital Course by problem list: 1. Postinfectious ileus 2. AKI  Tim [name] with abdominal pain, nausea and vomiting. Initially started on 1/11 and CT on 1/12 showeddilated fluid-filled distal small bowel with bowel wall thickening,mesenteric edema,and free fluid in the  abdomen and pelvis. This was felt to be secondary to enteritis and he was discharged home.Symptoms have been intermittent since and PCP got an image of his abdomen that illustrated gallstones and a bowel obstruction.He was subsequently sent to the ED for further evaluation.CT abdomen showed small bowel obstruction with mid distal ileal transition point. No obvious cause. No mass is identified. No stated history of prior abdominal surgery making adhesions unlikely. Cholelithiasis without definite CT findings for  acute Cholecystitis. General surgery evaluated the patient and he was treated with IV fluids and pain control. Follow up abdominal xray showed a decrease in caliber of gas distended small bowel loops, small bowel wall now appear thickened compared to yesterday's consistent with inflammation Most likely a postinfectious ileus as this self resolved.  He had a Cr of 1.95 admission which improved to 1.21 with IV fluids.  Discharge Vitals:   BP 101/69 (BP Location: Right Arm)   Pulse 82   Temp 97.9 F (36.6 C) (Oral)   Resp 18   Ht 5\' 10"  (1.778 m)   Wt 76.7 kg   SpO2 100%   BMI 24.25 kg/m   Pertinent Labs, Studies, and Procedures:   BMP Latest Ref Rng & Units 10/05/2018 10/04/2018 10/03/2018  Glucose 70 - 99 mg/dL 103(H) 96 124(H)  BUN 8 - 23 mg/dL 24(H) 36(H) 43(H)  Creatinine 0.61 - 1.24 mg/dL 1.21 1.47(H) 1.95(H)  BUN/Creat Ratio 10 - 24 - - -  Sodium 135 - 145 mmol/L 140 141 140  Potassium 3.5 - 5.1 mmol/L 4.0 3.7 4.1  Chloride 98 - 111 mmol/L 108 109 104  CO2 22 - 32 mmol/L 24 23 24   Calcium 8.9 - 10.3 mg/dL 8.3(L) 8.4(L) 8.5(L)   Dg Abd 1 View  Result Date: 10/04/2018 CLINICAL DATA:  85 year old male with mid abdominal pain for the past week. Subsequent encounter. EXAM: ABDOMEN - 1 VIEW COMPARISON:  10/03/2018 CT and plain film exam. FINDINGS: Although there has been a decrease in the caliber of gas distended small bowel loops currently measuring up to 4 cm versus prior 5.5 cm, the small bowel folds and small bowel wall now appear thickened compared to yesterday's exam. This suggests progressive small bowel inflammation. The possibility of free intraperitoneal air cannot be assessed on a supine view. Descending colon and sigmoid colon colonic diverticula. Residual contrast in bladder. Degenerative changes lumbar spine and left hip. Gallstones.  No discrete evidence to suggest gallstone ileus. IMPRESSION: Although there has been a decrease in the caliber of gas distended small bowel loops  currently measuring up to 4 cm versus prior 5.5 cm, the small bowel folds and small bowel wall now appear thickened compared to yesterday's exam. This suggests progressive small bowel inflammation. These results will be called to the ordering clinician or representative by the Radiologist Assistant, and communication documented in the PACS or zVision Dashboard. Electronically Signed   By: Genia Del M.D.   On: 10/04/2018 07:50   Ct Abdomen Pelvis W Contrast  Result Date: 10/03/2018 CLINICAL DATA:  Generalized abdominal pain. EXAM: CT ABDOMEN AND PELVIS WITH CONTRAST TECHNIQUE: Multidetector CT imaging of the abdomen and pelvis was performed using the standard protocol following bolus administration of intravenous contrast. CONTRAST:  139mL OMNIPAQUE IOHEXOL 300 MG/ML  SOLN COMPARISON:  CT scan 09/26/2018 FINDINGS: Lower chest: Bibasilar scarring changes. The heart is normal in size. Coronary artery calcifications are noted. Hepatobiliary: Small hepatic cysts are stable. No worrisome hepatic lesions. Gallbladder is slightly distended and there are calcified gallstones  but no CT findings suggestive of acute cholecystitis. No common bile duct dilatation. Pancreas: No mass, inflammation or ductal dilatation. Spleen: Normal size.  No focal lesions. Adrenals/Urinary Tract: The adrenal glands and kidneys are unremarkable and stable. The bladder appears normal. Stomach/Bowel: The stomach and duodenum are unremarkable. There are dilated small bowel loops with air-fluid levels consistent with obstruction. There appears to be a transition in the mid abdomen involving the mid distal ileum. The patient does not have any stated history of prior abdominal surgery to suggest adhesions but I do not see any evidence of mass or internal hernia. The colon is relatively decompressed. Moderate colonic diverticulosis. No free air is identified. Small amount of free pelvic fluid. Vascular/Lymphatic: Stable atherosclerotic  calcifications involving the aorta iliac arteries. 2.7 cm infrarenal abdominal aortic aneurysm is stable. No mesenteric or retroperitoneal mass or adenopathy. Reproductive: Enlarged prostate gland with median lobe hypertrophy impressing on the base of the bladder. The seminal vesicles are grossly normal. Other: Small amount of free pelvic fluid. Small amount of fluid also around the liver but no free air. Musculoskeletal: No significant bony findings. Advanced degenerative changes involving the spine are noted. IMPRESSION: 1. CT findings suggest a small bowel obstruction with mid distal ileal transition point. No obvious cause. No mass is identified. No stated history of prior abdominal surgery making adhesions unlikely. 2. Cholelithiasis without definite CT findings for acute cholecystitis. 3. Moderate prostate gland enlargement with median lobe hypertrophy impressing on the base of the bladder. 4. Colonic diverticulosis without findings for acute diverticulitis. Electronically Signed   By: Marijo Sanes M.D.   On: 10/03/2018 16:03   Dg Abd 2 Views  Result Date: 10/05/2018 CLINICAL DATA:  Follow-up ileus EXAM: ABDOMEN - 2 VIEW COMPARISON:  10/04/2018 FINDINGS: Scattered large and small bowel gas is noted. Mild small bowel dilatation is again seen but improved when compared with the prior study. Multiple gallstones are again noted. No free air is seen. Degenerative changes of the lumbar spine are noted. IMPRESSION: Improving small bowel dilatation when compared with the prior exam. Electronically Signed   By: Inez Catalina M.D.   On: 10/05/2018 08:06    Discharge Instructions: Discharge Instructions    Diet - low sodium heart healthy   Complete by:  As directed    Increase activity slowly   Complete by:  As directed      Tim [name],  Please resume all of your home medications as prescribed. I want you to follow up with your PCP in one week to make sure your symptoms are continuing to improve.    Thank you for allowing [place] to be a part of your care!  Signed: Mike Craze, DO 10/06/2018, 9:35 AM

## 2018-10-06 NOTE — Progress Notes (Signed)
   Subjective: No overnight events. [name] reports that he's doing great today. He is sitting up in bed eating breakfast. He states he's been ready for breakfast since 1am because his body has to catch up for all the food he wasn't able to eat before. He had a BM last night. All of his questions were addressed.  Objective:  Vital signs in last 24 hours: Vitals:   10/05/18 1000 10/05/18 1417 10/05/18 2116 10/06/18 0424  BP: (!) 107/58 97/64 (!) 91/59 101/69  Pulse: 100 97 97 82  Resp: 18 18 18 18   Temp:  98.3 F (36.8 C) 98.3 F (36.8 C) 97.9 F (36.6 C)  TempSrc:  Oral Oral Oral  SpO2: 100% 99% 99% 100%  Weight:      Height:       Gen: elderly man, sitting up comfortably in bed, no distress CV: RRR, no murmurs Pulm: ctab, normal effort   Assessment/Plan:  Principal Problem:   Ileus ([place])  [name]. Tim [name] is an 86 y.o male with hypertension, BPH, PVD, CAD with a history of MI, presenting with abdominal pain, nausea and vomiting. Initially started on 1/11 and CT on 1/12 showeddilated fluid-filled distal small bowel with bowel wall thickening,mesenteric edema,and free fluid in the abdomen and pelvis. This was felt to be secondary to enteritis and he was discharged home.Symptoms have been intermittent since and he saw his PCP today who got an image of his abdomen that illustrated gallstones and a bowel obstruction.He was subsequently sent to the ED for further evaluation.  Small bowel obstruction vs ileus  -Most likely a postinfectious ileus as this has self resovled - Tolerating regular diet - Having BMs - Discharge home today   Dispo: Patient is medically stable for discharge today.  Tim Craze, DO 10/06/2018, 9:01 AM Pager: [phone]

## 2018-10-06 NOTE — Progress Notes (Signed)
[name] to be D/C'd  per MD order. Discussed with the patient and all questions fully answered.  VSS, Skin clean, dry and intact without evidence of skin break down, no evidence of skin tears noted.  IV catheter discontinued intact. Site without signs and symptoms of complications. Dressing and pressure applied.  An After Visit Summary was printed and given to the patient. Patient received prescription.  D/c education completed with patient/family including follow up instructions, medication list, d/c activities limitations if indicated, with other d/c instructions as indicated by MD - patient able to verbalize understanding, all questions fully answered.   Patient instructed to return to ED, call 911, or call MD for any changes in condition.   Patient to be escorted via [place], and D/C home via private auto.

## 2018-10-08 ENCOUNTER — Other Ambulatory Visit: Payer: Self-pay | Admitting: Cardiology

## 2018-10-14 ENCOUNTER — Other Ambulatory Visit: Payer: Self-pay | Admitting: Cardiology

## 2018-10-17 ENCOUNTER — Other Ambulatory Visit: Payer: Self-pay | Admitting: Cardiology

## 2018-10-17 ENCOUNTER — Telehealth: Payer: Self-pay | Admitting: *Deleted

## 2018-10-17 NOTE — Telephone Encounter (Signed)
Pt. Daughter called office this morning to request refill on patient's cardizem 120 mg capsule, but medication is not on his  Medication list is pt. Suppose to be on this medication if so please address thank you.

## 2018-10-17 NOTE — Telephone Encounter (Signed)
In review of the chart patient had been taking Diltiazem 120 QD on 04/25/2018 but on 05/04/2018 it was listed as discontinued by Prescott Parma, RN.  I am unsure of who that is.  I see no other documentation r/t this.  Called and spoke with daughter who reports patient had been taking it, became sick with a GI illness, was hospitalized on January 2020.  During the hospitalization it was not administered however pt resumed it on his own after returning home.  Daughter reports he took his last one yesterday.   She is asking if he should be taking it or not.  She reports his BP was "good" without it during his hospitalization and has also been good since he has been home and taking it.  Advised I will have Dr Marlou Porch review to determine if pt should be taking Diltiazem 120 mg QD or not.  Pt does have f/u appt scheduled with Cecilie Kicks, NP 10/31/2018.

## 2018-10-17 NOTE — Telephone Encounter (Signed)
Lets stay off of the diltiazem.  Let see how he does. Candee Furbish, MD

## 2018-10-18 NOTE — Telephone Encounter (Signed)
Left message on VM to continue off Diltiazem for now per Dr Marlou Porch and contact the office should concerns arise.  Pt should keep appt for f/u as scheduled with Cecilie Kicks, NP.

## 2018-10-19 ENCOUNTER — Other Ambulatory Visit: Payer: Self-pay | Admitting: Cardiology

## 2018-10-19 NOTE — Telephone Encounter (Signed)
Have not received a call back with any questions.  Will close this encounter and await a c/b if questions or concerns.

## 2018-10-26 ENCOUNTER — Telehealth: Payer: Self-pay | Admitting: Cardiology

## 2018-10-26 MED ORDER — DILTIAZEM HCL ER BEADS 120 MG PO CP24: 120.0000 mg | ORAL_CAPSULE | Freq: Every day | ORAL | 3 refills | Status: DC

## 2018-10-26 NOTE — Telephone Encounter (Signed)
Reviewed information with Dr Marlou Porch who gives orders to restart Diltiazem CD 120 mg daily for BP as long as HR is WNL.  Spoke with [name] who states understanding.  She will verify HR prior to pt restarting.  It was 84 bpm at last check.  Pt will keep appt as scheduled with Cecilie Kicks, NP.

## 2018-10-26 NOTE — Telephone Encounter (Signed)
Dr. Marlou Porch pt. Kelli Churn, RN has been speaking with the pt about his BP.

## 2018-10-26 NOTE — Telephone Encounter (Signed)
New Message    Pt c/o BP issue: STAT if pt c/o blurred vision, one-sided weakness or slurred speech  1. What are your last 5 BP readings? 171/90, 149/87, 155/85, and 163/83  2. Are you having any other symptoms (ex. Dizziness, headache, blurred vision, passed out)? No   3. What is your BP issue? BP has been elevated and Cecilie Kicks wanted her to report her dad's blood pressure readings if they are elevated .

## 2018-10-28 ENCOUNTER — Encounter (INDEPENDENT_AMBULATORY_CARE_PROVIDER_SITE_OTHER): Payer: Medicare Other | Admitting: Ophthalmology

## 2018-10-28 DIAGNOSIS — H353221 Exudative age-related macular degeneration, left eye, with active choroidal neovascularization: Secondary | ICD-10-CM

## 2018-10-28 DIAGNOSIS — H33301 Unspecified retinal break, right eye: Secondary | ICD-10-CM | POA: Diagnosis not present

## 2018-10-28 DIAGNOSIS — H353112 Nonexudative age-related macular degeneration, right eye, intermediate dry stage: Secondary | ICD-10-CM

## 2018-10-28 DIAGNOSIS — H43813 Vitreous degeneration, bilateral: Secondary | ICD-10-CM

## 2018-10-30 NOTE — Progress Notes (Signed)
Cardiology Office Note   Date:  10/31/2018   ID:  Tim [name], DOB 1929-07-13, MRN [id number]  PCP:  Raelene Bott, MD  Cardiologist:  Dr. Marlou Porch     Chief Complaint  Patient presents with  . Coronary Artery Disease      History of Present Illness: Tim [name] is a 85 y.o. male who presents for CAD.   He has a hx of bilateral carotid artery disease,, coronary artery disease status post STEMI in 2001 with PCI to RCA, residual 100% circumflex, subsequent PCI for in-stent restenosis in June 2001, nuclear in 2014 with no scar, no ischemia 62%, with subsequent CABG by Dr. Servando Snare in 2018, left localized pericardial effusion chronic.[name] He has had trouble with statins in the past, elevated CPK in the past. Former smoker quit in 1980. Here for follow up.  COPD.  Prior neck surgery.  07/27/17-he was [facility] with minimally elevated troponin, increased slightly.  This was thought to be secondary to elevated blood pressure and demand ischemia.  He was placed on a heparin shot.  He states.  His blood pressure was as high as 494 systolic.  This was unusual for him.  His troponin was 0.07.  Down trended.  EKG showed no changes.  It was recommended that he consider a nuclear stress test in the future, most recent was 2014 and was normal.  Finished a course of Z-Pak.  11/11/17 - Back in Nov of 2018 after his demand ischemia episode, he went for cath and this showed 3v disease, including sequential 70% and 50% proximal/mid LAD stenoses as well as chronic total occlusion of the proximal/mid LCx and distal RCA.,Moderate to severe in-stent restenosis in the mid/distal RCA. Pt then was evaluated by Dr. Servando Snare and pulmonary with Dr. Lake Bells for COPD eval for surgery. He has paralyzed Rt hemidiaphragm.   He underwent CABG X 4 08/24/17 with LIMA to LAD, VG to diagonal, VG to LCX, VG to PDA Discharged 09/03/17.   Complicated by PAF post op, amio. Pericarditis (T wave  peaking on ECG). Wheezing on atenolol, changed to metoprolol. Dizzy with this. Edema. Stopped taking it. Tingling in chest. No pain since surgery he says. He wants to get back to work, [employer].  05/09/18  - Not as much energy as he would like.  He took 2 injections of Repatha and he felt as though he was having the flu so he stopped.  He has not taken the third injection.  He would like for his cholesterol checked.  He also wakes up with cough in the morning.  He is having a test to evaluate his swallowing in the hospital today.  He still feels some musculoskeletal discomfort from his neck down to his stomach from bypass.  He had prior cervical spine fusion by Dr. Carloyn Manner several years ago, states that he believes that his right ear hearing is gone because of this.  Seems unusual.  Denies any anginal symptoms.  No fevers chills nausea vomiting syncope.  Recent hospitalization for most likely post infectious ileus. Also AKi.  Last labs 10/05/18 Cr 1.21 down from 1.47 and 1.95.   Today he is doing well.  Back to his baseline.  No chest pain and stable chronic SOB.  He is followed by pulmonary.  Again unable to take statins or Repatha due to side effects.  His edema is stable , he does not take his lasix on Sundays.   Past Medical History:  Diagnosis Date  .  AAA (abdominal aortic aneurysm) (HCC)    3.2 cm 07/2016; 2-3 year f/u recommended  . Asthma   . BPH (benign prostatic hypertrophy)   . CAD (coronary artery disease)    9 STEMI 2001, PCI, RCA, residual 100% circumflex  /   PCI for in-stent restenosis June, 2001  /  nuclear February, 2010, no ischemia  . Cancer ([place])    skin cancer removed  . Carotid artery disease ([place])    Doppler, June, 2011, 0-39% bilateral, pt denies  . Cervical disc disease    Status post neurosurgery  . Colon polyps   . Ejection fraction    EF 55-65%, echo, 2009  . Elevated CPK    Chronic mild CPK elevation  . Emphysema    Mild  . Fatigue    Morning fatigue, August,  2011  . GERD (gastroesophageal reflux disease)   . Headache    back of head  . Hyperlipidemia   . Hypertension   . Low back pain    February, 2013  . Myocardial infarction ([place])   . Paralyzed hemidiaphragm    Right  . Paralyzed hemidiaphragm    Chronic  . Statin intolerance    Elevated CPK in the past    Past Surgical History:  Procedure Laterality Date  . BREAST SURGERY     right breast removed  . CERVICAL DISC SURGERY    . CORONARY ARTERY BYPASS GRAFT N/A 08/24/2017   Procedure: CORONARY ARTERY BYPASS GRAFTING times four using left internal mammary artery and bilateral saphenous vein, using endoscope. TEE;  Surgeon: Grace Isaac, MD;  Location: [facility];  Service: Open Heart Surgery;  Laterality: N/A;  . LEFT HEART CATH AND CORONARY ANGIOGRAPHY N/A 07/29/2017   Procedure: LEFT HEART CATH AND CORONARY ANGIOGRAPHY;  Surgeon: Nelva Bush, MD;  Location: [facility];  Service: Cardiovascular;  Laterality: N/A;  . SKIN CANCER EXCISION    . TEE WITHOUT CARDIOVERSION N/A 08/24/2017   Procedure: TRANSESOPHAGEAL ECHOCARDIOGRAM (TEE);  Surgeon: Grace Isaac, MD;  Location: [facility];  Service: Open Heart Surgery;  Laterality: N/A;     Current Outpatient Medications  Medication Sig Dispense Refill  . acetaminophen (TYLENOL) 500 MG tablet Take 500 mg by mouth every 4 (four) hours as needed for mild pain.    [name] albuterol (PROVENTIL HFA;VENTOLIN HFA) 108 (90 BASE) MCG/ACT inhaler Inhale 2 puffs into the lungs every 6 (six) hours as needed for wheezing or shortness of breath. 1 Inhaler 6  . aspirin EC 81 MG tablet Take 81 mg by mouth daily.    [name] diltiazem (TIAZAC) 120 MG 24 hr capsule Take 1 capsule (120 mg total) by mouth daily. 90 capsule 3  . doxycycline (VIBRAMYCIN) 50 MG capsule Take 50 mg by mouth daily.     . finasteride (PROSCAR) 5 MG tablet Take 10 mg by mouth daily.     . fluticasone (FLONASE) 50 MCG/ACT nasal spray Place 2 sprays into both nostrils daily as  needed for allergies or rhinitis.    . fluticasone furoate-vilanterol (BREO ELLIPTA) 200-25 MCG/INH AEPB Inhale 1 puff into the lungs daily. 1 each 5  . furosemide (LASIX) 40 MG tablet Take 1 tablet (40 mg total) by mouth daily as needed for fluid. 90 tablet 0  . ipratropium (ATROVENT) 0.03 % nasal spray Place 2 sprays into both nostrils every 12 (twelve) hours. 30 mL 5  . levalbuterol (XOPENEX) 0.63 MG/3ML nebulizer solution Take 3 mLs (0.63 mg total) by nebulization every 4 (  four) hours as needed for wheezing or shortness of breath. 360 mL 5  . montelukast (SINGULAIR) 10 MG tablet Take 1 tablet (10 mg total) by mouth at bedtime. 30 tablet 11  . Multiple Vitamins-Minerals (ICAPS PO) Take 1 capsule by mouth daily.    [name] omeprazole (PRILOSEC) 40 MG capsule Take 40 mg by mouth 2 (two) times daily.    . potassium chloride SA (K-DUR,KLOR-CON) 20 MEQ tablet Take 1 tablet (20 mEq total) by mouth daily. 90 tablet 3  . sodium chloride HYPERTONIC 3 % nebulizer solution Take by nebulization 2 (two) times daily. 300 mL 11  . tamsulosin (FLOMAX) 0.4 MG CAPS capsule Take 0.4 mg by mouth 2 (two) times daily. For urinary symptoms      No current facility-administered medications for this visit.     Allergies:   Statins and Repatha [evolocumab]    Social History:  The patient  reports that he quit smoking about 43 years ago. His smoking use included cigarettes. He has a 120.00 pack-year smoking history. He has never used smokeless tobacco. He reports that he does not drink alcohol or use drugs.   Family History:  The patient's family history includes Cancer in his brother; Diabetes in his mother and sister; Kidney disease in his brother; Other in his father and sister.    ROS:  General:no colds or fevers, + weight increase Skin:no rashes or ulcers HEENT:no blurred vision, no congestion CV:see HPI PUL:see HPI GI:no diarrhea constipation or melena, no indigestion GU:no hematuria, no dysuria MS:no joint  pain, no claudication Neuro:no syncope, no lightheadedness Endo:no diabetes, no thyroid disease  Wt Readings from Last 3 Encounters:  10/31/18 177 lb 6.4 oz (80.5 kg)  10/03/18 169 lb (76.7 kg)  08/15/18 178 lb 6.4 oz (80.9 kg)     PHYSICAL EXAM: VS:  BP 120/62   Pulse (!) 102   Ht 5\' 10"  (1.778 m)   Wt 177 lb 6.4 oz (80.5 kg)   SpO2 97%   BMI 25.45 kg/m  , BMI Body mass index is 25.45 kg/m. General:Pleasant affect, NAD Skin:Warm and dry, brisk capillary refill HEENT:normocephalic, sclera clear, mucus membranes moist Neck:supple, no JVD, no bruits  Heart:S1S2 RRR with soft systolic murmur, no gallup, rub or click, chest incision well healed Lungs:clear without rales, occ rhonchi, no wheezes BPZ:WCHE, non tender, + BS, do not palpate liver spleen or masses Ext:1+ lower ext edema, rt leg and 1.5 + on lt leg. 2+ pedal pulses, 2+ radial pulses Neuro:alert and oriented X 3, MAE, follows commands, + facial symmetry    EKG:  EKG is NOT ordered today. The ekg from the hospital was St at 102 and stable.    Recent Labs: 10/03/2018: ALT 18 10/04/2018: Hemoglobin 9.9; Platelets 305 10/05/2018: BUN 24; Creatinine, Ser 1.21; Potassium 4.0; Sodium 140    Lipid Panel    Component Value Date/Time   CHOL 152 05/09/2018 0909   TRIG 82 05/09/2018 0909   HDL 38 (L) 05/09/2018 0909   CHOLHDL 4.0 05/09/2018 0909   LDLCALC 98 05/09/2018 0909       Other studies Reviewed: Additional studies/ records that were reviewed today include: . TTE 11/11/17  Study Conclusions  - Left ventricle: The cavity size was normal. Wall thickness was   normal. Systolic function was normal. The estimated ejection   fraction was in the range of 60% to 65%. Doppler parameters are   consistent with both elevated ventricular end-diastolic filling   pressure and elevated left atrial  filling pressure. - Left atrium: The atrium was mildly dilated. - Atrial septum: No defect or patent foramen ovale was  identified. - Pericardium, extracardiac: Moderate localized posterior lateral   pericardial effusion no tamponade  ASSESSMENT AND PLAN:  1.  CAD with hx of stents and ultimately CABG 12.2018.  No chest pain or palpitations.  Incisions healing.  CABG X 4 08/24/17 with LIMA to LAD, VG to diagonal, VG to LCX, VG to PDA   2.  Lower ext edema, chronic stable he does wear support stockings.   3.  HLD and intolerant to statins and Repatha. Last LDL was 98.  4.  HTN after his meds were adjusted in hospital with dehydration. Now stable.  Back on dilt and doing well.   5.  Recent post infection ileus now resolved.  6.  PAF post CABG and none since.   on dilt.    If a fib were to return will need anticoagulation.  7.  Hx of pericardial effusion        Current medicines are reviewed with the patient today.  The patient Has no concerns regarding medicines.  The following changes have been made:  See above Labs/ tests ordered today include:see above  Disposition:   FU:  see above  Signed, Cecilie Kicks, NP  10/31/2018 9:36 AM    [facility], [place], [place], [place] Phone: [phone]; Fax: [phone]

## 2018-10-31 ENCOUNTER — Encounter: Payer: Self-pay | Admitting: Cardiology

## 2018-10-31 ENCOUNTER — Ambulatory Visit (INDEPENDENT_AMBULATORY_CARE_PROVIDER_SITE_OTHER): Payer: Medicare Other | Admitting: Cardiology

## 2018-10-31 VITALS — BP 120/62 | HR 102 | Ht 70.0 in | Wt 177.4 lb

## 2018-10-31 DIAGNOSIS — I3139 Other pericardial effusion (noninflammatory): Secondary | ICD-10-CM

## 2018-10-31 DIAGNOSIS — I10 Essential (primary) hypertension: Secondary | ICD-10-CM

## 2018-10-31 DIAGNOSIS — Z951 Presence of aortocoronary bypass graft: Secondary | ICD-10-CM | POA: Diagnosis not present

## 2018-10-31 DIAGNOSIS — Z789 Other specified health status: Secondary | ICD-10-CM

## 2018-10-31 DIAGNOSIS — I2510 Atherosclerotic heart disease of native coronary artery without angina pectoris: Secondary | ICD-10-CM

## 2018-10-31 DIAGNOSIS — I313 Pericardial effusion (noninflammatory): Secondary | ICD-10-CM

## 2018-10-31 NOTE — Patient Instructions (Addendum)
Medication Instructions:  Your physician recommends that you continue on your current medications as directed. Please refer to the Current Medication list given to you today.  If you need a refill on your cardiac medications before your next appointment, please call your pharmacy.   Lab work: None ordered  If you have labs (blood work) drawn today and your tests are completely normal, you will receive your results only by: . MyChart Message (if you have MyChart) OR . A paper copy in the mail If you have any lab test that is abnormal or we need to change your treatment, we will call you to review the results.  Testing/Procedures: None ordered  Follow-Up: At [facility], you and your health needs are our priority.  As part of our continuing mission to provide you with exceptional heart care, we have created designated Provider Care Teams.  These Care Teams include your primary Cardiologist (physician) and Advanced Practice Providers (APPs -  Physician Assistants and Nurse Practitioners) who all work together to provide you with the care you need, when you need it. You will need a follow up appointment in 6 months.  Please call our office 2 months in advance to schedule this appointment.  You may see Mark Skains, MD or one of the following Advanced Practice Providers on your designated Care Team:   Lori Gerhardt, NP Laura Ingold, NP . Jill McDaniel, NP  Any Other Special Instructions Will Be Listed Below (If Applicable).    

## 2018-11-14 ENCOUNTER — Encounter: Payer: Self-pay | Admitting: Pulmonary Disease

## 2018-11-14 ENCOUNTER — Ambulatory Visit (INDEPENDENT_AMBULATORY_CARE_PROVIDER_SITE_OTHER): Payer: Medicare Other | Admitting: Pulmonary Disease

## 2018-11-14 VITALS — BP 120/64 | HR 99 | Ht 70.0 in | Wt 178.0 lb

## 2018-11-14 DIAGNOSIS — J181 Lobar pneumonia, unspecified organism: Secondary | ICD-10-CM | POA: Diagnosis not present

## 2018-11-14 DIAGNOSIS — R1310 Dysphagia, unspecified: Secondary | ICD-10-CM

## 2018-11-14 DIAGNOSIS — R911 Solitary pulmonary nodule: Secondary | ICD-10-CM

## 2018-11-14 DIAGNOSIS — R918 Other nonspecific abnormal finding of lung field: Secondary | ICD-10-CM

## 2018-11-14 DIAGNOSIS — J4550 Severe persistent asthma, uncomplicated: Secondary | ICD-10-CM

## 2018-11-14 DIAGNOSIS — J301 Allergic rhinitis due to pollen: Secondary | ICD-10-CM

## 2018-11-14 DIAGNOSIS — T17908A Unspecified foreign body in respiratory tract, part unspecified causing other injury, initial encounter: Secondary | ICD-10-CM

## 2018-11-14 NOTE — Patient Instructions (Addendum)
Lung mass: I am encouraged by the findings from the PET scan We will consider a repeat CT chest later this year to follow this finding again  Continuous postnasal drip: Keep taking montelukast daily Keep taking fluticasone nose sprays daily Use ipratropium nose spray as needed  Severe persistent asthma: Continue taking Breo 200, 1 puff daily Call your insurance company and ask for a medication formulary so that we can see if there is an alternative to this Continue using Xopenex as needed for chest tightness wheezing or shortness of breath  Paralyzed hemidiaphragm: Call us in the event of increasing cough, fevers chills or mucus production  Recurrent aspiration:  Continue to follow the aspiration and dysphagia recommendations from speech therapy  Follow-up with me in 3 to 4 months or sooner if needed

## 2018-11-14 NOTE — Progress Notes (Signed)
Subjective:    Patient ID: Tim [name], male    DOB: [birth year], 85 y.o.   MRN: [id number]  Synopsis: Former patient of Dr. Gwenette Greet with COPD and a paralyzed R Hemidiaphragm as seen on fluoroscopy in 2007. Lung function testing in 2005 showed clear airflow obstruction, FEV1 of 1.53 L (43% predicted), total lung capacity 62% predicted, DLCO 73% predicted. He smoked 2-3 packs per day for 40 years, quit around 1980.  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   Had CABG by Dr. Servando Snare in 7782 complicated by a pericardial effusion.  Found to have evidence of pharygneal phase dysphagia and aspiraiton on modified barium swallow in 04/2018.   HPI Chief Complaint  Patient presents with  . Follow-up    6 wk f/u. Was in the hospital last month for a stomach virus. States his breathing has been ok since last visit.    Tim [name] was hospitalized for a gi virus about a month ago.  He says he was hospitalized for 3 days total.  He says that in general he thinks his breathing is doing okay.  He says that he has not had bronchitis or pneumonia since last visit.  However, he still has some shortness of breath with minimal exertion in the house.  He thinks that he has set standards for himself that his body are not able to keep up with.  Specifically he tries to set a high task list for items he wants to build in his workshop and tasks he likes to do around the house.  Still has clear phlegm   Past Medical History:  Diagnosis Date  . AAA (abdominal aortic aneurysm) (HCC)    3.2 cm 07/2016; 2-3 year f/u recommended  . Asthma   . BPH (benign prostatic hypertrophy)   . CAD (coronary artery disease)    9 STEMI 2001, PCI, RCA, residual 100% circumflex  /   PCI for in-stent restenosis June, 2001  /  nuclear February, 2010, no ischemia  . Cancer ([place])    skin cancer removed  . Carotid artery disease ([place])    Doppler, June, 2011, 0-39% bilateral, pt denies  . Cervical disc  disease    Status post neurosurgery  . Colon polyps   . Ejection fraction    EF 55-65%, echo, 2009  . Elevated CPK    Chronic mild CPK elevation  . Emphysema    Mild  . Fatigue    Morning fatigue, August, 2011  . GERD (gastroesophageal reflux disease)   . Headache    back of head  . Hyperlipidemia   . Hypertension   . Low back pain    February, 2013  . Myocardial infarction ([place])   . Paralyzed hemidiaphragm    Right  . Paralyzed hemidiaphragm    Chronic  . Statin intolerance    Elevated CPK in the past      Review of Systems  Constitutional: Negative for fatigue and fever.  HENT: Negative for postnasal drip and rhinorrhea.   Respiratory: Positive for cough. Negative for shortness of breath and wheezing.   Cardiovascular: Negative for chest pain and leg swelling.       Objective:   Physical Exam Vitals:   11/14/18 0955  BP: 120/64  Pulse: 99  SpO2: 98%  Weight: 178 lb (80.7 kg)  Height: 5\' 10"  (1.778 m)   RA  Gen: elderly male, chronically ill appearing HENT: OP clear, TM's clear, neck supple PULM:  CTA B, normal percussion CV: RRR, no mgr, trace edema GI: BS+, soft, nontender Derm: no cyanosis or rash Psyche: normal mood and affect   Chest imaging 01/2016 CXR > granuloma left upper lobe November 2018 CT chest: Less than 4 mm solid pleural-based right upper lobe nodule noted, groundglass nodule 11.9 mm right upper lobe, centrilobular emphysema noted, nonspecific scarring in the bases.  Images independently reviewed by me November 10, 2017 CT chest images independently reviewed showing mild to moderate centrilobular emphysema, atelectasis in the bases, left upper lobe nodule 12 mm in size new from prior, stable right lung groundglass nodule is dating back to 2008, moderate to large loculated pericardial effusion noted 04/2018 CT chest > new RUL spiculated nodule, 2.2 x 3.7 cm bilateral uper lobe nodules, emphysema, images independently reviewed October 2019  CT chest showed persistent nodule 3.9 cm in the right upper lobe, stable lower lobe nodules, emphysema noted. Personally reviewed 09/09/2018 PET/CT> decreasing size mass, low FDG Avidity images independently reviewed, there is a 3.7 x 1.3 nodular opacity in the right upper lobe which has decreased in size compared to previous   Labs: CBC    Component Value Date/Time   WBC 10.0 10/04/2018 0627   RBC 3.93 (L) 10/04/2018 0627   HGB 9.9 (L) 10/04/2018 0627   HGB 13.0 07/27/2017 1020   HCT 32.3 (L) 10/04/2018 0627   HCT 38.3 07/27/2017 1020   PLT 305 10/04/2018 0627   PLT 269 07/27/2017 1020   MCV 82.2 10/04/2018 0627   MCV 87 07/27/2017 1020   MCH 25.2 (L) 10/04/2018 0627   MCHC 30.7 10/04/2018 0627   RDW 17.1 (H) 10/04/2018 0627   RDW 14.7 07/27/2017 1020   LYMPHSABS 1.3 10/03/2018 1358   MONOABS 1.2 (H) 10/03/2018 1358   EOSABS 0.1 10/03/2018 1358   BASOSABS 0.0 10/03/2018 1358   Labs: 08/2017 IgE normal January 2019 IgE normal  Exhaled nitric oxide test: January 2018 22 ppm    Orther imaging: 04/2018 Barium swallow and modified barium swallow showed gross aspiration, therapy and instruction offered.  Records from his hospitalization on January 2020 reviewed where he was seen for small bowel obstruction versus ileus and treated with conservative management.  He had resolution of symptoms with IV fluids     Assessment & Plan:   Solitary pulmonary nodule  Lung mass  Lobar pneumonia, unspecified organism ([place])  Dysphagia, unspecified type  Aspiration into airway, initial encounter  Allergic rhinitis due to pollen, unspecified seasonality  Severe persistent asthma without complication  Discussion: I am pleased that the PET scan did not show worrisome findings for this right upper lobe nodular abnormality.  I think this is slowly resolving pneumonia.  He is still struggling with the cost of Breo.  I talked to his family today about how to navigate through an  insurance formulary and encouraged them to call us so that we can see if there is a more affordable option for him.  Plan:  Lung mass: I am encouraged by the findings from the PET scan We will consider a repeat CT chest later this year to follow this finding again  Continuous postnasal drip: Keep taking montelukast daily Keep taking fluticasone nose sprays daily Use ipratropium nose spray as needed  Severe persistent asthma: Continue taking Breo 200, 1 puff daily Call your insurance company and ask for a medication formulary so that we can see if there is an alternative to this Continue using Xopenex as needed for chest tightness wheezing or  shortness of breath  Paralyzed hemidiaphragm: Call us in the event of increasing cough, fevers chills or mucus production  Recurrent aspiration:  Continue to follow the aspiration and dysphagia recommendations from speech therapy  Follow-up with me in 3 to 4 months or sooner if needed  Current Outpatient Medications:  .  acetaminophen (TYLENOL) 500 MG tablet, Take 500 mg by mouth every 4 (four) hours as needed for mild pain., Disp: , Rfl:  .  albuterol (PROVENTIL HFA;VENTOLIN HFA) 108 (90 BASE) MCG/ACT inhaler, Inhale 2 puffs into the lungs every 6 (six) hours as needed for wheezing or shortness of breath., Disp: 1 Inhaler, Rfl: 6 .  aspirin EC 81 MG tablet, Take 81 mg by mouth daily., Disp: , Rfl:  .  diltiazem (TIAZAC) 120 MG 24 hr capsule, Take 1 capsule (120 mg total) by mouth daily., Disp: 90 capsule, Rfl: 3 .  doxycycline (VIBRAMYCIN) 50 MG capsule, Take 50 mg by mouth daily. , Disp: , Rfl:  .  finasteride (PROSCAR) 5 MG tablet, Take 10 mg by mouth daily. , Disp: , Rfl:  .  fluticasone (FLONASE) 50 MCG/ACT nasal spray, Place 2 sprays into both nostrils daily as needed for allergies or rhinitis., Disp: , Rfl:  .  fluticasone furoate-vilanterol (BREO ELLIPTA) 200-25 MCG/INH AEPB, Inhale 1 puff into the lungs daily., Disp: 1 each, Rfl: 5 .   furosemide (LASIX) 40 MG tablet, Take 1 tablet (40 mg total) by mouth daily as needed for fluid., Disp: 90 tablet, Rfl: 0 .  ipratropium (ATROVENT) 0.03 % nasal spray, Place 2 sprays into both nostrils every 12 (twelve) hours., Disp: 30 mL, Rfl: 5 .  levalbuterol (XOPENEX) 0.63 MG/3ML nebulizer solution, Take 3 mLs (0.63 mg total) by nebulization every 4 (four) hours as needed for wheezing or shortness of breath., Disp: 360 mL, Rfl: 5 .  montelukast (SINGULAIR) 10 MG tablet, Take 1 tablet (10 mg total) by mouth at bedtime., Disp: 30 tablet, Rfl: 11 .  Multiple Vitamins-Minerals (ICAPS PO), Take 1 capsule by mouth daily., Disp: , Rfl:  .  omeprazole (PRILOSEC) 40 MG capsule, Take 40 mg by mouth 2 (two) times daily., Disp: , Rfl:  .  potassium chloride SA (K-DUR,KLOR-CON) 20 MEQ tablet, Take 1 tablet (20 mEq total) by mouth daily., Disp: 90 tablet, Rfl: 3 .  sodium chloride HYPERTONIC 3 % nebulizer solution, Take by nebulization 2 (two) times daily., Disp: 300 mL, Rfl: 11 .  tamsulosin (FLOMAX) 0.4 MG CAPS capsule, Take 0.4 mg by mouth 2 (two) times daily. For urinary symptoms , Disp: , Rfl:

## 2018-11-25 ENCOUNTER — Encounter (INDEPENDENT_AMBULATORY_CARE_PROVIDER_SITE_OTHER): Payer: Medicare Other | Admitting: Ophthalmology

## 2018-11-25 ENCOUNTER — Other Ambulatory Visit: Payer: Self-pay

## 2018-11-25 DIAGNOSIS — H353112 Nonexudative age-related macular degeneration, right eye, intermediate dry stage: Secondary | ICD-10-CM | POA: Diagnosis not present

## 2018-11-25 DIAGNOSIS — H43813 Vitreous degeneration, bilateral: Secondary | ICD-10-CM | POA: Diagnosis not present

## 2018-11-25 DIAGNOSIS — H33301 Unspecified retinal break, right eye: Secondary | ICD-10-CM

## 2018-11-25 DIAGNOSIS — H353221 Exudative age-related macular degeneration, left eye, with active choroidal neovascularization: Secondary | ICD-10-CM

## 2018-12-18 ENCOUNTER — Other Ambulatory Visit: Payer: Self-pay | Admitting: Cardiology

## 2018-12-19 NOTE — Telephone Encounter (Signed)
klor- con refilled.

## 2018-12-30 ENCOUNTER — Other Ambulatory Visit: Payer: Self-pay

## 2018-12-30 ENCOUNTER — Encounter (INDEPENDENT_AMBULATORY_CARE_PROVIDER_SITE_OTHER): Payer: Medicare Other | Admitting: Ophthalmology

## 2018-12-30 DIAGNOSIS — H43813 Vitreous degeneration, bilateral: Secondary | ICD-10-CM | POA: Diagnosis not present

## 2018-12-30 DIAGNOSIS — H353112 Nonexudative age-related macular degeneration, right eye, intermediate dry stage: Secondary | ICD-10-CM

## 2018-12-30 DIAGNOSIS — H33301 Unspecified retinal break, right eye: Secondary | ICD-10-CM

## 2018-12-30 DIAGNOSIS — H353221 Exudative age-related macular degeneration, left eye, with active choroidal neovascularization: Secondary | ICD-10-CM

## 2019-01-12 ENCOUNTER — Other Ambulatory Visit: Payer: Self-pay | Admitting: Cardiology

## 2019-01-12 ENCOUNTER — Other Ambulatory Visit: Payer: Self-pay

## 2019-01-12 MED ORDER — OMEPRAZOLE 40 MG PO CPDR: 40.0000 mg | DELAYED_RELEASE_CAPSULE | Freq: Two times a day (BID) | ORAL | 0 refills | Status: DC

## 2019-01-12 NOTE — Telephone Encounter (Signed)
Tim [name] check with pt and see how he is doing.   He normally takes the lasix 40 mg daily and none on Sunday at least when I last saw him.  But if you could check.  If he has increased dose then needs virtual app.  Thanks.

## 2019-01-26 ENCOUNTER — Other Ambulatory Visit: Payer: Self-pay

## 2019-01-26 ENCOUNTER — Encounter (INDEPENDENT_AMBULATORY_CARE_PROVIDER_SITE_OTHER): Payer: Medicare Other | Admitting: Ophthalmology

## 2019-01-26 DIAGNOSIS — H353112 Nonexudative age-related macular degeneration, right eye, intermediate dry stage: Secondary | ICD-10-CM

## 2019-01-26 DIAGNOSIS — H33301 Unspecified retinal break, right eye: Secondary | ICD-10-CM

## 2019-01-26 DIAGNOSIS — H43813 Vitreous degeneration, bilateral: Secondary | ICD-10-CM

## 2019-01-26 DIAGNOSIS — H353221 Exudative age-related macular degeneration, left eye, with active choroidal neovascularization: Secondary | ICD-10-CM | POA: Diagnosis not present

## 2019-02-23 ENCOUNTER — Other Ambulatory Visit: Payer: Self-pay

## 2019-02-23 ENCOUNTER — Encounter (INDEPENDENT_AMBULATORY_CARE_PROVIDER_SITE_OTHER): Payer: Medicare Other | Admitting: Ophthalmology

## 2019-02-23 DIAGNOSIS — H43813 Vitreous degeneration, bilateral: Secondary | ICD-10-CM

## 2019-02-23 DIAGNOSIS — H33301 Unspecified retinal break, right eye: Secondary | ICD-10-CM

## 2019-02-23 DIAGNOSIS — H353221 Exudative age-related macular degeneration, left eye, with active choroidal neovascularization: Secondary | ICD-10-CM | POA: Diagnosis not present

## 2019-02-23 DIAGNOSIS — H353112 Nonexudative age-related macular degeneration, right eye, intermediate dry stage: Secondary | ICD-10-CM | POA: Diagnosis not present

## 2019-03-24 ENCOUNTER — Encounter (INDEPENDENT_AMBULATORY_CARE_PROVIDER_SITE_OTHER): Payer: Medicare Other | Admitting: Ophthalmology

## 2019-03-24 ENCOUNTER — Other Ambulatory Visit: Payer: Self-pay

## 2019-03-24 DIAGNOSIS — H43813 Vitreous degeneration, bilateral: Secondary | ICD-10-CM | POA: Diagnosis not present

## 2019-03-24 DIAGNOSIS — H353221 Exudative age-related macular degeneration, left eye, with active choroidal neovascularization: Secondary | ICD-10-CM | POA: Diagnosis not present

## 2019-03-24 DIAGNOSIS — H33301 Unspecified retinal break, right eye: Secondary | ICD-10-CM | POA: Diagnosis not present

## 2019-03-24 DIAGNOSIS — H353112 Nonexudative age-related macular degeneration, right eye, intermediate dry stage: Secondary | ICD-10-CM | POA: Diagnosis not present

## 2019-04-12 ENCOUNTER — Other Ambulatory Visit: Payer: Self-pay | Admitting: Pulmonary Disease

## 2019-04-12 ENCOUNTER — Other Ambulatory Visit: Payer: Self-pay | Admitting: Cardiology

## 2019-04-21 ENCOUNTER — Other Ambulatory Visit: Payer: Self-pay

## 2019-04-21 ENCOUNTER — Encounter (INDEPENDENT_AMBULATORY_CARE_PROVIDER_SITE_OTHER): Payer: Medicare Other | Admitting: Ophthalmology

## 2019-04-21 DIAGNOSIS — H353112 Nonexudative age-related macular degeneration, right eye, intermediate dry stage: Secondary | ICD-10-CM | POA: Diagnosis not present

## 2019-04-21 DIAGNOSIS — H33301 Unspecified retinal break, right eye: Secondary | ICD-10-CM

## 2019-04-21 DIAGNOSIS — H353221 Exudative age-related macular degeneration, left eye, with active choroidal neovascularization: Secondary | ICD-10-CM | POA: Diagnosis not present

## 2019-04-21 DIAGNOSIS — H43813 Vitreous degeneration, bilateral: Secondary | ICD-10-CM | POA: Diagnosis not present

## 2019-05-09 NOTE — Progress Notes (Signed)
Cardiology Office Note   Date:  05/11/2019   ID:  Tim [name], DOB December 27, 1928, MRN [id number]  PCP:  Tim Bott, MD  Cardiologist:  Dr. Marlou Porch    Chief Complaint  Patient presents with  . Coronary Artery Disease    did have SOB      History of Present Illness: Tim [name] is a 85 y.o. male who presents for CAD and SOB  He has a hx of bilateral carotid artery disease,, coronary artery disease status post STEMI in 2001 with PCI to RCA, residual 100% circumflex, subsequent PCI for in-stent restenosis in June 2001, nuclear in 2014 with no scar, no ischemia 62%,with subsequent CABG by Dr. Servando Snare in 2018, left localized pericardial effusion chronic.Tim [name] He has had trouble with statins in the past, elevated CPK in the past. Former smoker quit in 1980. Here for follow up.COPD.  Prior neck surgery.  07/27/17-he was seen at [facility] with minimally elevated troponin, increased slightly. This was thought to be secondary to elevated blood pressure and demand ischemia. He was placed on a heparin shot. He states. His blood pressure was as high as [id number] systolic. This was unusual for him. His troponin was 0.07. Down trended. EKG showed no changes. It was recommended that he consider a nuclear stress test in the future, most recent was 2014 and was normal. Finished a course of Z-Pak.  11/11/17 - Back in Nov of 2018 after his demand ischemia episode, he went for cath and this showed 3v disease, including sequential 70% and 50% proximal/mid LAD stenoses as well as chronic total occlusion of the proximal/mid LCx and distal RCA.,Moderate to severe in-stent restenosis in the mid/distal RCA. Pt then was evaluated by Dr. Servando Snare and pulmonary with Dr. Lake Bells for COPD eval for surgery. He has paralyzed Rt hemidiaphragm.   He underwentCABG X 4 12/11/18with LIMA to LAD, VG to diagonal, VG to LCX, VG to PDA Discharged 09/03/17.   Complicated by PAF post op,  amio. Pericarditis (T wave peaking on ECG). Wheezing on atenolol, changed to metoprolol. Dizzy with this. Edema. Stopped taking it. Tingling in chest. No pain since surgery he says. He wants to get back to work, [employer].  05/09/18 - Not as much energyas he would like. He took 2 injections of Repatha and he felt as though he was having the flu so he stopped. He has not taken the third injection. He would like for his cholesterol checked. He also wakes up with cough in the morning. He is having a test to evaluate his swallowing in the hospital today. He still feels some musculoskeletal discomfort from his neck down to his stomach from bypass. He had prior cervical spine fusion by Dr. Carloyn Manner several years ago, states that he believes that his right ear hearing is gone because of this. Seems unusual. Denies any anginal symptoms. No fevers chills nausea vomiting syncope.  Recent hospitalization for most likely post infectious ileus. Also AKi.  Last labs 10/05/18 Cr 1.21 down from 1.47 and 1.95.   10/31/2018 he is doing well.  Back to his baseline.  No chest pain and stable chronic SOB.  He is followed by pulmonary.  Again unable to take statins or Repatha due to side effects.  His edema is stable , he does not take his lasix on Sundays.   Today 05/11/19  (Time for lipids he would like PCP to do.)  he was seen by PCP and Hgb was 9.0 chemistry was normal with CKD -  3 and cr of 1.30.   He denies chest pain, does have tenderness along CABG incision, an area he feels when he rubs it, may be a knot from suture, but under skin and incision is well healed.  When he saw PCP he had dyspnea and is to see Pulmonary. But with lower hgb this may be cause, today pt denies any SOB.   He has some burning in bottom of feet may be neuropathy.  Prior carotid disease pre-cabg doppler 40-59% in Lt.    Past Medical History:  Diagnosis Date  . AAA (abdominal aortic aneurysm) (HCC)    3.2 cm 07/2016; 2-3 year f/u  recommended  . Asthma   . BPH (benign prostatic hypertrophy)   . CAD (coronary artery disease)    9 STEMI 2001, PCI, RCA, residual 100% circumflex  /   PCI for in-stent restenosis June, 2001  /  nuclear February, 2010, no ischemia  . Cancer (Tim [name])    skin cancer removed  . Carotid artery disease (Tim [name])    Doppler, June, 2011, 0-39% bilateral, pt denies  . Cervical disc disease    Status post neurosurgery  . Colon polyps   . Ejection fraction    EF 55-65%, echo, 2009  . Elevated CPK    Chronic mild CPK elevation  . Emphysema    Mild  . Fatigue    Morning fatigue, August, 2011  . GERD (gastroesophageal reflux disease)   . Headache    back of head  . Hyperlipidemia   . Hypertension   . Low back pain    February, 2013  . Myocardial infarction ([place])   . Paralyzed hemidiaphragm    Right  . Paralyzed hemidiaphragm    Chronic  . Statin intolerance    Elevated CPK in the past    Past Surgical History:  Procedure Laterality Date  . BREAST SURGERY     right breast removed  . CERVICAL DISC SURGERY    . CORONARY ARTERY BYPASS GRAFT N/A 08/24/2017   Procedure: CORONARY ARTERY BYPASS GRAFTING times four using left internal mammary artery and bilateral saphenous vein, using endoscope. TEE;  Surgeon: Grace Isaac, MD;  Location: [facility];  Service: Open Heart Surgery;  Laterality: N/A;  . LEFT HEART CATH AND CORONARY ANGIOGRAPHY N/A 07/29/2017   Procedure: LEFT HEART CATH AND CORONARY ANGIOGRAPHY;  Surgeon: Nelva Bush, MD;  Location: [facility];  Service: Cardiovascular;  Laterality: N/A;  . SKIN CANCER EXCISION    . TEE WITHOUT CARDIOVERSION N/A 08/24/2017   Procedure: TRANSESOPHAGEAL ECHOCARDIOGRAM (TEE);  Surgeon: Grace Isaac, MD;  Location: [facility];  Service: Open Heart Surgery;  Laterality: N/A;     Current Outpatient Medications  Medication Sig Dispense Refill  . acetaminophen (TYLENOL) 500 MG tablet Take 500 mg by mouth every 4 (four) hours as needed for  mild pain.    Tim [name] albuterol (PROVENTIL HFA;VENTOLIN HFA) 108 (90 BASE) MCG/ACT inhaler Inhale 2 puffs into the lungs every 6 (six) hours as needed for wheezing or shortness of breath. 1 Inhaler 6  . aspirin EC 81 MG tablet Take 81 mg by mouth daily.    Tim [name] diltiazem (TIAZAC) 120 MG 24 hr capsule Take 1 capsule (120 mg total) by mouth daily. 90 capsule 3  . doxycycline (VIBRAMYCIN) 50 MG capsule Take 50 mg by mouth daily.     . finasteride (PROSCAR) 5 MG tablet Take 10 mg by mouth daily.     . fluticasone (FLONASE) 50 MCG/ACT  nasal spray Place 2 sprays into both nostrils daily as needed for allergies or rhinitis.    . fluticasone furoate-vilanterol (BREO ELLIPTA) 200-25 MCG/INH AEPB Inhale 1 puff into the lungs daily. 1 each 5  . furosemide (LASIX) 40 MG tablet TAKE 1 TABLET (40 MG TOTAL) BY MOUTH DAILY AS NEEDED FOR FLUID. 90 tablet 0  . ipratropium (ATROVENT) 0.03 % nasal spray Place 2 sprays into both nostrils every 12 (twelve) hours. 30 mL 5  . KLOR-CON M20 20 MEQ tablet TAKE 1 TABLET BY MOUTH EVERY DAY 90 tablet 1  . levalbuterol (XOPENEX) 0.63 MG/3ML nebulizer solution Take 3 mLs (0.63 mg total) by nebulization every 4 (four) hours as needed for wheezing or shortness of breath. 360 mL 5  . montelukast (SINGULAIR) 10 MG tablet Take 1 tablet (10 mg total) by mouth at bedtime. 30 tablet 11  . Multiple Vitamins-Minerals (ICAPS PO) Take 1 capsule by mouth daily.    Tim [name] omeprazole (PRILOSEC) 40 MG capsule TAKE 1 CAPSULE BY MOUTH TWICE A DAY 180 capsule 0  . sodium chloride HYPERTONIC 3 % nebulizer solution Take by nebulization 2 (two) times daily. 300 mL 11  . tamsulosin (FLOMAX) 0.4 MG CAPS capsule Take 0.4 mg by mouth 2 (two) times daily. For urinary symptoms      No current facility-administered medications for this visit.     Allergies:   Statins and Repatha [evolocumab]    Social History:  The patient  reports that he quit smoking about 43 years ago. His smoking use included cigarettes. He has  a 120.00 pack-year smoking history. He has never used smokeless tobacco. He reports that he does not drink alcohol or use drugs.   Family History:  The patient's family history includes Cancer in his brother; Diabetes in his mother and sister; Kidney disease in his brother; Other in his father and sister.    ROS:  General:no colds or fevers, no weight changes Skin:no rashes or ulcers HEENT:no blurred vision, no congestion CV:see HPI PUL:see HPI GI:no diarrhea constipation or melena, no indigestion GU:no hematuria, no dysuria MS:no joint pain, no claudication Neuro:no syncope, no lightheadedness Endo:no diabetes, no thyroid disease  Wt Readings from Last 3 Encounters:  05/11/19 176 lb 12.8 oz (80.2 kg)  11/14/18 178 lb (80.7 kg)  10/31/18 177 lb 6.4 oz (80.5 kg)     PHYSICAL EXAM: VS:  BP 136/60   Pulse 85   Ht 5\' 10"  (1.778 m)   Wt 176 lb 12.8 oz (80.2 kg)   SpO2 97%   BMI 25.37 kg/m  , BMI Body mass index is 25.37 kg/m. General:Pleasant affect, NAD Skin:Warm and dry, brisk capillary refill HEENT:normocephalic, sclera clear, mucus membranes moist Neck:supple, no JVD, no bruits  Heart:S1S2 RRR without murmur, gallup, rub or click Lungs:clear without rales, rhonchi, or wheezes [id number], non tender, + BS, do not palpate liver spleen or masses Ext:no lower ext edema, 2+ pedal pulses, 2+ radial pulses wears support stockings. Neuro:alert and oriented X 3, MAE, follows commands, + facial symmetry    EKG:  EKG is NOT ordered today.    Recent Labs: 10/03/2018: ALT 18 10/04/2018: Hemoglobin 9.9; Platelets 305 10/05/2018: BUN 24; Creatinine, Ser 1.21; Potassium 4.0; Sodium 140    Lipid Panel    Component Value Date/Time   CHOL 152 05/09/2018 0909   TRIG 82 05/09/2018 0909   HDL 38 (L) 05/09/2018 0909   CHOLHDL 4.0 05/09/2018 0909   LDLCALC 98 05/09/2018 0909  Other studies Reviewed: Additional studies/ records that were reviewed today include: .  TTE  11/11/17  Study Conclusions  - Left ventricle: The cavity size was normal. Wall thickness was normal. Systolic function was normal. The estimated ejection fraction was in the range of 60% to 65%. Doppler parameters are consistent with both elevated ventricular end-diastolic filling pressure and elevated left atrial filling pressure. - Left atrium: The atrium was mildly dilated. - Atrial septum: No defect or patent foramen ovale was identified. - Pericardium, extracardiac: Moderate localized posterior lateral pericardial effusion no tamponade    ASSESSMENT AND PLAN:  1.  Dyspnea, pt denies today, HR is regular, hgb is 9 this may be contributing to dyspnea, he will follow up with pulmonary as well.  He split wood yesterday.    2.  CAD with hx CABG 2018 no angina. No change in meds  3.  Hx PAF HR regular today on dilt,   4.  HLD unable to take statins or repatha needs lipids, PCP to check per pt in next visit.   5.  Iron def anemia may be cause of dyspnea now on Iron.  6.  Carotid disease, will check dopplers it has been 2 years and not on statin.    Current medicines are reviewed with the patient today.  The patient Has no concerns regarding medicines.  The following changes have been made:  See above Labs/ tests ordered today include:see above  Disposition:   FU:  see above  Signed, Cecilie Kicks, NP  05/11/2019 3:55 PM    [facility] [address], [place], [place], [place] Phone: [phone]; Fax: [phone]

## 2019-05-11 ENCOUNTER — Other Ambulatory Visit: Payer: Self-pay

## 2019-05-11 ENCOUNTER — Encounter: Payer: Self-pay | Admitting: Cardiology

## 2019-05-11 ENCOUNTER — Ambulatory Visit (INDEPENDENT_AMBULATORY_CARE_PROVIDER_SITE_OTHER): Payer: Medicare Other | Admitting: Cardiology

## 2019-05-11 VITALS — BP 136/60 | HR 85 | Ht 70.0 in | Wt 176.8 lb

## 2019-05-11 DIAGNOSIS — D509 Iron deficiency anemia, unspecified: Secondary | ICD-10-CM

## 2019-05-11 DIAGNOSIS — E782 Mixed hyperlipidemia: Secondary | ICD-10-CM | POA: Diagnosis not present

## 2019-05-11 DIAGNOSIS — I6523 Occlusion and stenosis of bilateral carotid arteries: Secondary | ICD-10-CM | POA: Diagnosis not present

## 2019-05-11 DIAGNOSIS — I480 Paroxysmal atrial fibrillation: Secondary | ICD-10-CM

## 2019-05-11 DIAGNOSIS — R0600 Dyspnea, unspecified: Secondary | ICD-10-CM | POA: Diagnosis not present

## 2019-05-11 DIAGNOSIS — I2510 Atherosclerotic heart disease of native coronary artery without angina pectoris: Secondary | ICD-10-CM | POA: Diagnosis not present

## 2019-05-11 NOTE — Patient Instructions (Addendum)
Medication Instructions:  Your physician recommends that you continue on your current medications as directed. Please refer to the Current Medication list given to you today.  If you need a refill on your cardiac medications before your next appointment, please call your pharmacy.   Lab work: None ordered  If you have labs (blood work) drawn today and your tests are completely normal, you will receive your results only by: [name] MyChart Message (if you have MyChart) OR . A paper copy in the mail If you have any lab test that is abnormal or we need to change your treatment, we will call you to review the results.  Testing/Procedures: Your physician has requested that you have a carotid duplex. This test is an ultrasound of the carotid arteries in your neck. It looks at blood flow through these arteries that supply the brain with blood. Allow one hour for this exam. There are no restrictions or special instructions.    Follow-Up: At [facility], you and your health needs are our priority.  As part of our continuing mission to provide you with exceptional heart care, we have created designated Provider Care Teams.  These Care Teams include your primary Cardiologist (physician) and Advanced Practice Providers (APPs -  Physician Assistants and Nurse Practitioners) who all work together to provide you with the care you need, when you need it. You will need a follow up appointment in 6 months.  Please call our office 2 months in advance to schedule this appointment.  You may see Candee Furbish, MD or one of the following Advanced Practice Providers on your designated Care Team:   Truitt Merle, NP Cecilie Kicks, NP . Kathyrn Drown, NP  Any Other Special Instructions Will Be Listed Below (If Applicable).

## 2019-05-17 ENCOUNTER — Ambulatory Visit (HOSPITAL_COMMUNITY)
Admission: RE | Admit: 2019-05-17 | Discharge: 2019-05-17 | Disposition: A | Discharge status: Home / Self Care | Payer: Medicare Other | Source: Ambulatory Visit | Attending: Cardiology | Admitting: Cardiology

## 2019-05-17 ENCOUNTER — Other Ambulatory Visit (HOSPITAL_COMMUNITY): Payer: Self-pay | Admitting: Cardiology

## 2019-05-17 ENCOUNTER — Other Ambulatory Visit: Payer: Self-pay

## 2019-05-17 DIAGNOSIS — I6523 Occlusion and stenosis of bilateral carotid arteries: Secondary | ICD-10-CM

## 2019-05-17 DIAGNOSIS — I2510 Atherosclerotic heart disease of native coronary artery without angina pectoris: Secondary | ICD-10-CM

## 2019-05-18 ENCOUNTER — Encounter (INDEPENDENT_AMBULATORY_CARE_PROVIDER_SITE_OTHER): Payer: Medicare Other | Admitting: Ophthalmology

## 2019-05-18 ENCOUNTER — Telehealth: Payer: Self-pay

## 2019-05-18 DIAGNOSIS — H353112 Nonexudative age-related macular degeneration, right eye, intermediate dry stage: Secondary | ICD-10-CM | POA: Diagnosis not present

## 2019-05-18 DIAGNOSIS — H33301 Unspecified retinal break, right eye: Secondary | ICD-10-CM

## 2019-05-18 DIAGNOSIS — H353221 Exudative age-related macular degeneration, left eye, with active choroidal neovascularization: Secondary | ICD-10-CM | POA: Diagnosis not present

## 2019-05-18 DIAGNOSIS — H43813 Vitreous degeneration, bilateral: Secondary | ICD-10-CM | POA: Diagnosis not present

## 2019-05-18 NOTE — Telephone Encounter (Signed)
-----   Message from Isaiah Serge, NP sent at 05/18/2019  1:46 PM EDT ----- Carotid dopplers stable moderate disease.

## 2019-05-18 NOTE — Telephone Encounter (Signed)
Notes recorded by Frederik Schmidt, RN on 05/18/2019 at 1:59 PM EDT  The patient's daughter has been notified of the result and verbalized understanding. All questions (if any) were answered.  Frederik Schmidt, RN 05/18/2019 1:59 PM

## 2019-05-24 ENCOUNTER — Telehealth: Payer: Self-pay | Admitting: Primary Care

## 2019-05-24 ENCOUNTER — Encounter: Payer: Self-pay | Admitting: Primary Care

## 2019-05-24 ENCOUNTER — Ambulatory Visit (INDEPENDENT_AMBULATORY_CARE_PROVIDER_SITE_OTHER): Payer: Medicare Other | Admitting: Primary Care

## 2019-05-24 ENCOUNTER — Other Ambulatory Visit: Payer: Self-pay

## 2019-05-24 VITALS — BP 144/62 | HR 104 | Ht 70.0 in | Wt 180.0 lb

## 2019-05-24 DIAGNOSIS — J449 Chronic obstructive pulmonary disease, unspecified: Secondary | ICD-10-CM

## 2019-05-24 DIAGNOSIS — R911 Solitary pulmonary nodule: Secondary | ICD-10-CM | POA: Diagnosis not present

## 2019-05-24 MED ORDER — BREO ELLIPTA 200-25 MCG/INH IN AEPB: 1.0000 | INHALATION_SPRAY | Freq: Every day | RESPIRATORY_TRACT | 0 refills | Status: DC

## 2019-05-24 NOTE — Telephone Encounter (Signed)
Breo copay of 50 dollars is too expensive for patient.  Are there any cheaper alternatives

## 2019-05-24 NOTE — Progress Notes (Signed)
Reviewed, agree 

## 2019-05-24 NOTE — Patient Instructions (Signed)
  Recommendations: - Take mucinex twice daily for chest congestion - Continue Breo 1 puff daily (sample given) - Use Albuterol rescue inhaler every 6 hours as needed for breakthrough shortness of breath - Xopenex nebulizer twice daily as needed * I will talk with our pharmacist about alternative options for your inhalers   Orders: - CT chest wo contrast re: pulmonary nodule (2 months)  Follow-up - 3 months with new pulmonary provider (former [facility] patient)

## 2019-05-24 NOTE — Progress Notes (Signed)
@Patient  ID: Tim [name], male    DOB: 12-01-1928, 85 y.o.   MRN: [id number]  Chief Complaint  Patient presents with  . Follow-up    Referring provider: Raelene Bott, MD  HPI: 85 year old male, former smoker quit in 1977 (120 pack years).  Past medical history significant for COPD, chronic rhinitis, paralyzed hemidiaphragm, solitary pulmonary nodule, community-acquired pneumonia, GERD, BPH, chronic renal insufficiency stage III.  Patient of Dr. Lake Bells, last seen March 2020.  Patient had a reassuring PET scan with no worrisome findings for the right upper lobe nodular abnormality believed to be slowly resolving pneumonia.  Needs repeat CT chest at the end of 2020.  Maintained on Breo although cost of medication has been an ongoing issue.  05/24/2019 Patient presents today for 6 month follow-up. Accompanied by his son. He has been doing well, breathing is stable. Reports occasional wheezing. He has more phlegm than anything which is described as clear. Congestion is worse prior to bedtime and in the morning. Once he coughs mucus up he is ok. He is not currently taking an expectorant. Continues Breo as prescribed, prescription is expensive for him with co-pay of 50 dollars. Uses nebulizer more at night, approx 1-2 times a day. He can not walk far without becoming fatigued. Uses cane. 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    Allergies  Allergen Reactions  . Statins Other (See Comments)    CLASS EFFECT SEVERE LEG CRAMPS [MULTIPLE STATINS]  . Repatha [Evolocumab]     Flu like symptoms    Immunization History  Administered Date(s) Administered  . Influenza Split 06/10/2011, 07/08/2012, 06/14/2014, 05/30/2015  . Influenza Whole 06/14/2009, 05/26/2010  . Influenza,inj,Quad PF,6+ Mos 06/14/2013, 05/28/2014, 05/30/2015, 05/25/2016, 06/25/2017, 05/23/2018  . Pneumococcal Conjugate-13 06/14/2014  . Pneumococcal Polysaccharide-23 05/24/2009  . Tdap 01/14/2018  . Zoster Recombinat  (Shingrix) 01/14/2018, 03/15/2018    Past Medical History:  Diagnosis Date  . AAA (abdominal aortic aneurysm) (HCC)    3.2 cm 07/2016; 2-3 year f/u recommended  . Asthma   . BPH (benign prostatic hypertrophy)   . CAD (coronary artery disease)    9 STEMI 2001, PCI, RCA, residual 100% circumflex  /   PCI for in-stent restenosis June, 2001  /  nuclear February, 2010, no ischemia  . Cancer ([place])    skin cancer removed  . Carotid artery disease ([place])    Doppler, June, 2011, 0-39% bilateral, pt denies  . Cervical disc disease    Status post neurosurgery  . Colon polyps   . Ejection fraction    EF 55-65%, echo, 2009  . Elevated CPK    Chronic mild CPK elevation  . Emphysema    Mild  . Fatigue    Morning fatigue, August, 2011  . GERD (gastroesophageal reflux disease)   . Headache    back of head  . Hyperlipidemia   . Hypertension   . Low back pain    February, 2013  . Myocardial infarction ([place])   . Paralyzed hemidiaphragm    Right  . Paralyzed hemidiaphragm    Chronic  . Statin intolerance    Elevated CPK in the past    Tobacco History: Social History   Tobacco Use  Smoking Status Former Smoker  . Packs/day: 3.00  . Years: 40.00  . Pack years: 120.00  . Types: Cigarettes  . Quit date: 09/15/1975  . Years since quitting: 43.7  Smokeless Tobacco Never Used   Counseling given: Not Answered   Outpatient Medications  Prior to Visit  Medication Sig Dispense Refill  . acetaminophen (TYLENOL) 500 MG tablet Take 500 mg by mouth every 4 (four) hours as needed for mild pain.    [name] albuterol (PROVENTIL HFA;VENTOLIN HFA) 108 (90 BASE) MCG/ACT inhaler Inhale 2 puffs into the lungs every 6 (six) hours as needed for wheezing or shortness of breath. 1 Inhaler 6  . aspirin EC 81 MG tablet Take 81 mg by mouth daily.    [name] diltiazem (TIAZAC) 120 MG 24 hr capsule Take 1 capsule (120 mg total) by mouth daily. 90 capsule 3  . doxycycline (VIBRAMYCIN) 50 MG capsule Take 50 mg by mouth  daily.     . finasteride (PROSCAR) 5 MG tablet Take 10 mg by mouth daily.     . furosemide (LASIX) 40 MG tablet TAKE 1 TABLET (40 MG TOTAL) BY MOUTH DAILY AS NEEDED FOR FLUID. 90 tablet 0  . ipratropium (ATROVENT) 0.03 % nasal spray Place 2 sprays into both nostrils every 12 (twelve) hours. 30 mL 5  . KLOR-CON M20 20 MEQ tablet TAKE 1 TABLET BY MOUTH EVERY DAY 90 tablet 1  . levalbuterol (XOPENEX) 0.63 MG/3ML nebulizer solution Take 3 mLs (0.63 mg total) by nebulization every 4 (four) hours as needed for wheezing or shortness of breath. 360 mL 5  . montelukast (SINGULAIR) 10 MG tablet Take 1 tablet (10 mg total) by mouth at bedtime. 30 tablet 11  . omeprazole (PRILOSEC) 40 MG capsule TAKE 1 CAPSULE BY MOUTH TWICE A DAY 180 capsule 0  . sodium chloride HYPERTONIC 3 % nebulizer solution Take by nebulization 2 (two) times daily. 300 mL 11  . tamsulosin (FLOMAX) 0.4 MG CAPS capsule Take 0.4 mg by mouth 2 (two) times daily. For urinary symptoms     . fluticasone (FLONASE) 50 MCG/ACT nasal spray Place 2 sprays into both nostrils daily as needed for allergies or rhinitis.    . fluticasone furoate-vilanterol (BREO ELLIPTA) 200-25 MCG/INH AEPB Inhale 1 puff into the lungs daily. 1 each 5  . Multiple Vitamins-Minerals (ICAPS PO) Take 1 capsule by mouth daily.     No facility-administered medications prior to visit.     Review of Systems  Review of Systems  HENT: Positive for congestion.   Respiratory: Positive for cough and wheezing. Negative for shortness of breath.     Physical Exam  BP (!) 144/62 (BP Location: Left Arm, Cuff Size: Large)   Pulse (!) 104   Ht 5\' 10"  (1.778 m)   Wt 180 lb (81.6 kg)   SpO2 95%   BMI 25.83 kg/m  Physical Exam Constitutional:      General: He is not in acute distress.    Appearance: Normal appearance. He is not toxic-appearing.  HENT:     Head: Normocephalic and atraumatic.     Ears:     Comments: HOH Cardiovascular:     Rate and Rhythm: Normal rate and  regular rhythm.     Comments: Regular, HR 90s Pulmonary:     Effort: Pulmonary effort is normal. No respiratory distress.     Comments: Crackles right base Musculoskeletal: Normal range of motion.     Comments: Amb with cane  Neurological:     General: No focal deficit present.     Mental Status: He is alert and oriented to person, place, and time. Mental status is at baseline.  Psychiatric:        Mood and Affect: Mood normal.        Behavior: Behavior  normal.        Thought Content: Thought content normal.        Judgment: Judgment normal.      Lab Results:  CBC    Component Value Date/Time   WBC 10.0 10/04/2018 0627   RBC 3.93 (L) 10/04/2018 0627   HGB 9.9 (L) 10/04/2018 0627   HGB 13.0 07/27/2017 1020   HCT 32.3 (L) 10/04/2018 0627   HCT 38.3 07/27/2017 1020   PLT 305 10/04/2018 0627   PLT 269 07/27/2017 1020   MCV 82.2 10/04/2018 0627   MCV 87 07/27/2017 1020   MCH 25.2 (L) 10/04/2018 0627   MCHC 30.7 10/04/2018 0627   RDW 17.1 (H) 10/04/2018 0627   RDW 14.7 07/27/2017 1020   LYMPHSABS 1.3 10/03/2018 1358   MONOABS 1.2 (H) 10/03/2018 1358   EOSABS 0.1 10/03/2018 1358   BASOSABS 0.0 10/03/2018 1358    BMET    Component Value Date/Time   NA 140 10/05/2018 0151   NA 142 02/04/2018 0926   K 4.0 10/05/2018 0151   CL 108 10/05/2018 0151   CO2 24 10/05/2018 0151   GLUCOSE 103 (H) 10/05/2018 0151   BUN 24 (H) 10/05/2018 0151   BUN 24 02/04/2018 0926   CREATININE 1.21 10/05/2018 0151   CALCIUM 8.3 (L) 10/05/2018 0151   GFRNONAA 54 (L) 10/05/2018 0151   GFRAA >60 10/05/2018 0151    BNP No results found for: BNP  ProBNP    Component Value Date/Time   PROBNP 262.1 12/27/2013 1300    Imaging: Vas US Carotid  Result Date: 05/18/2019 Carotid Arterial Duplex Study Indications:       Bilateral carotid artery stenosis. Patient c/o a long history                    of intermittent headaches occurring along the right and left                    occipital area  and dizziness when standing up too quickly. He                    denies any other cerebrovascular symptoms. Risk Factors:      Hypertension, hyperlipidemia, past history of smoking,                    coronary artery disease. Other Factors:     CABG, COPD. Comparison Study:  In 04/2015, a carotid duplex showed velocities of 120/59 cm/s                    in the RICA suggesting 40-59% stenosis and 155/36 cm/s in the                    LICA suggesting [id number] stenosis. Bilateral subclavian artery                    stenosis, without steal. The right subclavian artery                    velocities were 302 cm/s, and the left was 352 cm/s. Performing Technologist: Sharlett Iles RVT  Examination Guidelines: A complete evaluation includes B-mode imaging, spectral Doppler, color Doppler, and power Doppler as needed of all accessible portions of each vessel. Bilateral testing is considered an integral part of a complete examination. Limited examinations for reoccurring indications may be performed as noted.  Right Carotid Findings: +----------+--------+--------+--------+------------------+---------------------+  PSV cm/sEDV cm/sStenosisPlaque DescriptionComments              +----------+--------+--------+--------+------------------+---------------------+ CCA Prox  81      10                                                      +----------+--------+--------+--------+------------------+---------------------+ CCA Distal59      11                                intimal thickening    +----------+--------+--------+--------+------------------+---------------------+ ICA Prox  94      19              heterogenous      hypoechoic center is                                                      evident without the                                                       plaque                +----------+--------+--------+--------+------------------+---------------------+ ICA Mid   128      29      1-39%                                           +----------+--------+--------+--------+------------------+---------------------+ ICA Distal88      26                                                      +----------+--------+--------+--------+------------------+---------------------+ ECA       200     11      >50%    heterogenous                            +----------+--------+--------+--------+------------------+---------------------+ +----------+--------+-------+----------------------+-------------------+           PSV cm/sEDV cmsDescribe              Arm Pressure (mmHG) +----------+--------+-------+----------------------+-------------------+ [id number]            Stenotic and turbulent137                 +----------+--------+-------+----------------------+-------------------+ +---------+--------+--+--------+-+---------+ VertebralPSV cm/s34EDV cm/s9Antegrade +---------+--------+--+--------+-+---------+  Left Carotid Findings: +----------+--------+--------+--------+------------------+------------------+           PSV cm/sEDV cm/sStenosisPlaque DescriptionComments           +----------+--------+--------+--------+------------------+------------------+ CCA Prox  81      11                                                   +----------+--------+--------+--------+------------------+------------------+  CCA Distal72      11                                intimal thickening +----------+--------+--------+--------+------------------+------------------+ ICA Prox  179     52              heterogenous                         +----------+--------+--------+--------+------------------+------------------+ ICA Mid   239     46      40-59%  heterogenous      tortuous           +----------+--------+--------+--------+------------------+------------------+ ICA Distal74      17                                tortuous            +----------+--------+--------+--------+------------------+------------------+ ECA       247     11      >50%    heterogenous                         +----------+--------+--------+--------+------------------+------------------+ +----------+--------+--------+---------+-------------------+           PSV cm/sEDV cm/sDescribe Arm Pressure (mmHG) +----------+--------+--------+---------+-------------------+ [id number]             Turbulent131                 +----------+--------+--------+---------+-------------------+ +---------+--------+--+--------+--+---------+ VertebralPSV cm/s57EDV cm/s10Antegrade +---------+--------+--+--------+--+---------+  Summary: Right Carotid: Velocities in the right ICA are consistent with a 1-39% stenosis.                The ECA appears >50% stenosed. The RICA velocities are stable                compared to prior exam and are within normal limits. Left Carotid: Velocities in the left ICA are consistent with a 40-59% stenosis.               The ECA appears >50% stenosed. The LICA velocities are elevated               and have increased when compared to the prior exam. Vertebrals:  Bilateral vertebral arteries demonstrate antegrade flow. Subclavians: Right subclavian artery was stenotic. Bilateral subclavian artery              flow was disturbed. Mild velocities decrease noted in the right              subclavian artery; moderate to significant decrease noted on the              left. *See table(s) above for measurements and observations. Suggest follow up study in 12 months. Electronically signed by Larae Grooms MD on 05/18/2019 at 8:13:39 AM.    Final      Assessment & Plan:   COPD (chronic obstructive pulmonary disease) (HCC) - Stable interval  - Continue Breo 1 puff daily (sample given, will check with pharmacist if there is a cheaper alternative) - Use Albuterol rescue inhaler 2 puffs every 6 hours as needed for breakthrough shortness of breath -  Advised mucinex twice daily for congestion  - Monitor for signs and symptoms of exacerbation - FU in 3 months with new pulmonary provider (former [facility] patient)   Solitary pulmonary nodule - Reassuring PET  scan with no worrisome findings for the right upper lobe nodular abnormality believed to be slowly resolving pneumonia - Repeat CT chest wo contrast in 2 months   Tim Ehrich, NP 05/24/2019

## 2019-05-24 NOTE — Assessment & Plan Note (Signed)
-   Stable interval  - Continue Breo 1 puff daily (sample given, will check with pharmacist if there is a cheaper alternative) - Use Albuterol rescue inhaler 2 puffs every 6 hours as needed for breakthrough shortness of breath - Advised mucinex twice daily for congestion  - Monitor for signs and symptoms of exacerbation - FU in 3 months with new pulmonary provider (former [facility] patient)

## 2019-05-24 NOTE — Assessment & Plan Note (Addendum)
-   Reassuring PET scan with no worrisome findings for the right upper lobe nodular abnormality believed to be slowly resolving pneumonia - Repeat CT chest wo contrast in 2 months

## 2019-05-25 NOTE — Telephone Encounter (Signed)
We did a few test claims for combination ICS/LABA (both DPI and MDI) and then separate ICS and LABA inhalers.  All cost $47.  The best option would be to apply for [facility] patient assistance through [place] if he is well controlled on that inhaler.  Another option could be to switch ICS/LABA to nebulized medications as this could potentially be a cheaper option if billed to Medicare Part B instead of Part D. [facility] and [facility] are  unable to bill Part B and will need to send a referral to [place] if unable to obtain grant or patient assistance.   Let me know if you need any further assistance.  Thanks!   Mariella Saa, PharmD, [place], [place] [facility] Pharmacist [phone]  05/25/2019 11:48 AM

## 2019-05-26 MED ORDER — BUDESONIDE 0.5 MG/2ML IN SUSP: 0.5000 mg | Freq: Two times a day (BID) | RESPIRATORY_TRACT | 6 refills | Status: DC

## 2019-05-26 MED ORDER — ARFORMOTEROL TARTRATE 15 MCG/2ML IN NEBU: 15.0000 ug | INHALATION_SOLUTION | Freq: Two times a day (BID) | RESPIRATORY_TRACT | 6 refills | Status: DC

## 2019-05-26 NOTE — Telephone Encounter (Signed)
Attempted to contact patient regarding [name], NP recommendations for patient's Memory Dance since patient states cost of $47 is too high.  Unable to reach. Left voicemail.

## 2019-05-26 NOTE — Telephone Encounter (Signed)
New info from [name], NP noted.  Daughter was instructed to call us if meds at pharmacy too expensive.  Will close encounter.

## 2019-05-26 NOTE — Telephone Encounter (Signed)
Returned call to daughter, [name] (on dpr).  She states the patient can afford the [facility] if he needs this but he thinks the cost is too high.  She also says they applied for patient assistance before and were denied because he has 'good insurance through the state' so she does not think he would qualify for this and is not interested in applying at this time.    She said the patient is agreeable to trying a nebulized medication as the alternative per [name], NP recommendations  and they prefer to try the prescription through the local pharmacy as they will deliver.  They had nebulized meds sent there before and cost was not a problem.  If they have problems with cost they will notify us.    Will route to [name], NP for recommendations on nebulized medication so we may place order to [place].   Please advise on medications.

## 2019-05-26 NOTE — Telephone Encounter (Signed)
Pt returning call [phone]

## 2019-05-26 NOTE — Telephone Encounter (Signed)
Can you let patient know I checked with our pharmacist and all inhaler options cost $47. Can we help him apply for [facility] patient assistance with [employer]. Otherwise, we could change to nebulized medication and bill to medicare part B with a DME company

## 2019-05-26 NOTE — Telephone Encounter (Signed)
It will likely be expensive at pharmacy but will send in nebulized ICS/LABA. If not covered needs to be sent to DME company.

## 2019-05-26 NOTE — Telephone Encounter (Signed)
Pt daughter returning call and can be reached @ [phone].Hillery Hunter

## 2019-06-09 ENCOUNTER — Telehealth: Payer: Self-pay | Admitting: Pulmonary Disease

## 2019-06-09 NOTE — Telephone Encounter (Signed)
[place] and asked to speak with Lelon Frohlich; however, at the time of the call, Lelon Frohlich was not available. The rep I spoke with states she will have Lelon Frohlich give our office a call back as soon as she is able.  Will hold in Triage to f/u on.

## 2019-06-09 NOTE — Telephone Encounter (Signed)
ATC Ann unable to reach as she was not available. Left message to call back

## 2019-06-09 NOTE — Telephone Encounter (Signed)
[facility] RETURNING CALL AND CAN BE REACHED @ [phone] ANN BROWN.Hillery Hunter

## 2019-06-13 NOTE — Telephone Encounter (Signed)
[place], spoke with Lelon Frohlich. Lelon Frohlich stated they needed to know if there were any changes in last OV.  Went over [place], NP's recommendations, Patient is scheduled for CT chest in 2 months, and follow 08/24/19 with Dr. Melvyn Novas.  Lelon Frohlich is NP in home health and stated she would have visit with him once per month and stated we can contact her with any questions or concerns she can assist with. Nothing further at this time.

## 2019-06-19 ENCOUNTER — Other Ambulatory Visit: Payer: Self-pay

## 2019-06-19 ENCOUNTER — Encounter (INDEPENDENT_AMBULATORY_CARE_PROVIDER_SITE_OTHER): Payer: Medicare Other | Admitting: Ophthalmology

## 2019-06-19 DIAGNOSIS — H33301 Unspecified retinal break, right eye: Secondary | ICD-10-CM | POA: Diagnosis not present

## 2019-06-19 DIAGNOSIS — H43813 Vitreous degeneration, bilateral: Secondary | ICD-10-CM

## 2019-06-19 DIAGNOSIS — H353221 Exudative age-related macular degeneration, left eye, with active choroidal neovascularization: Secondary | ICD-10-CM

## 2019-06-19 DIAGNOSIS — H353112 Nonexudative age-related macular degeneration, right eye, intermediate dry stage: Secondary | ICD-10-CM

## 2019-06-27 ENCOUNTER — Encounter: Payer: Self-pay | Admitting: *Deleted

## 2019-07-02 ENCOUNTER — Other Ambulatory Visit: Payer: Self-pay | Admitting: Cardiology

## 2019-07-04 NOTE — Telephone Encounter (Signed)
Pt of Dr. Marlou Porch

## 2019-07-13 ENCOUNTER — Other Ambulatory Visit: Payer: Self-pay | Admitting: Pulmonary Disease

## 2019-07-17 ENCOUNTER — Encounter (INDEPENDENT_AMBULATORY_CARE_PROVIDER_SITE_OTHER): Payer: Medicare Other | Admitting: Ophthalmology

## 2019-07-17 DIAGNOSIS — H43813 Vitreous degeneration, bilateral: Secondary | ICD-10-CM

## 2019-07-17 DIAGNOSIS — H353221 Exudative age-related macular degeneration, left eye, with active choroidal neovascularization: Secondary | ICD-10-CM

## 2019-07-17 DIAGNOSIS — H353112 Nonexudative age-related macular degeneration, right eye, intermediate dry stage: Secondary | ICD-10-CM | POA: Diagnosis not present

## 2019-07-27 ENCOUNTER — Inpatient Hospital Stay: Admission: RE | Admit: 2019-07-27 | Payer: Medicare Other | Source: Ambulatory Visit

## 2019-07-27 ENCOUNTER — Telehealth: Payer: Self-pay | Admitting: Pulmonary Disease

## 2019-07-27 ENCOUNTER — Telehealth: Payer: Self-pay | Admitting: Cardiology

## 2019-07-27 MED ORDER — BREO ELLIPTA 200-25 MCG/INH IN AEPB: 1.0000 | INHALATION_SPRAY | Freq: Every day | RESPIRATORY_TRACT | 1 refills | Status: DC

## 2019-07-27 NOTE — Telephone Encounter (Signed)
Spoke with daughter regarding patient.  She reports her brother informed her pt c/o "not feeling good" today and having "phelm"  She doesn't know of any wt gain but reports he has not increase in any swelling anywhere and no change in his shortness of breath.  No c/o chest pain.  Advised daughter would be best to call pt's PCP for further evaluation if necessary.  Daughter states she just wants him seen by someone and will call pt's PCP office.

## 2019-07-27 NOTE — Telephone Encounter (Signed)
New message:     Patient daughter calling stating that her father is not feeling well at all and would like for the patient to come in today. Please call back as soon as possible. Daughter states that they can leave a message concering appt.

## 2019-07-27 NOTE — Telephone Encounter (Signed)
Lm to cb to discuss concerns 

## 2019-07-27 NOTE — Telephone Encounter (Signed)
Returned call to daughter [name] who states patient feel Memory Dance was effective despite the cost. He is requesting a refill. Refill submitted to pharmacy. Nothing further needed.

## 2019-07-28 ENCOUNTER — Other Ambulatory Visit: Payer: Self-pay | Admitting: Cardiology

## 2019-08-14 ENCOUNTER — Encounter (INDEPENDENT_AMBULATORY_CARE_PROVIDER_SITE_OTHER): Payer: Medicare Other | Admitting: Ophthalmology

## 2019-08-14 DIAGNOSIS — H353112 Nonexudative age-related macular degeneration, right eye, intermediate dry stage: Secondary | ICD-10-CM

## 2019-08-14 DIAGNOSIS — H43813 Vitreous degeneration, bilateral: Secondary | ICD-10-CM | POA: Diagnosis not present

## 2019-08-14 DIAGNOSIS — H353221 Exudative age-related macular degeneration, left eye, with active choroidal neovascularization: Secondary | ICD-10-CM

## 2019-08-14 DIAGNOSIS — H33301 Unspecified retinal break, right eye: Secondary | ICD-10-CM

## 2019-08-15 ENCOUNTER — Ambulatory Visit (INDEPENDENT_AMBULATORY_CARE_PROVIDER_SITE_OTHER)
Admission: RE | Admit: 2019-08-15 | Discharge: 2019-08-15 | Disposition: A | Discharge status: Home / Self Care | Payer: Medicare Other | Source: Ambulatory Visit | Attending: Primary Care | Admitting: Primary Care

## 2019-08-15 ENCOUNTER — Other Ambulatory Visit: Payer: Self-pay

## 2019-08-15 DIAGNOSIS — R911 Solitary pulmonary nodule: Secondary | ICD-10-CM | POA: Diagnosis not present

## 2019-08-16 NOTE — Progress Notes (Signed)
Please let patient know CT chest appears stable, small new pulmonary nodule right lobe. Recommend repeat CT in 6 months to ensure stability. He can review CT results in further detail at his apt with DR. Wert on 12/14

## 2019-08-18 ENCOUNTER — Other Ambulatory Visit: Payer: Self-pay | Admitting: Primary Care

## 2019-08-18 DIAGNOSIS — R918 Other nonspecific abnormal finding of lung field: Secondary | ICD-10-CM

## 2019-08-18 NOTE — Progress Notes (Signed)
Called spoke with patient's daughter [name] (dpr on file).  Advised of CT results / recs as stated by [facility] NP.  [name] verbalized understanding and denied any questions.  Orders only encounter created for CT order.

## 2019-08-21 ENCOUNTER — Other Ambulatory Visit: Payer: Self-pay | Admitting: Pulmonary Disease

## 2019-08-24 ENCOUNTER — Ambulatory Visit: Payer: Medicare Other | Admitting: Internal Medicine

## 2019-08-28 ENCOUNTER — Encounter: Payer: Self-pay | Admitting: Internal Medicine

## 2019-08-28 ENCOUNTER — Ambulatory Visit (INDEPENDENT_AMBULATORY_CARE_PROVIDER_SITE_OTHER): Payer: Medicare Other | Admitting: Internal Medicine

## 2019-08-28 ENCOUNTER — Other Ambulatory Visit: Payer: Self-pay

## 2019-08-28 DIAGNOSIS — J449 Chronic obstructive pulmonary disease, unspecified: Secondary | ICD-10-CM | POA: Diagnosis not present

## 2019-08-28 NOTE — Patient Instructions (Addendum)
You will need to call us the names of the medications you are taking regularly versus the ones you just take as needed when needed ( Albuterol = Proair) .  Nebulized formoterol and budesonide   =   BREO   Only use your albuterol (proair) as a rescue medication to be used if you can't catch your breath by resting or doing a relaxed purse lip breathing pattern.  - The less you use it, the better it will work when you need it. - Ok to use up to 2 puffs  every 4 hours if you must but call for immediate appointment if use goes up over your usual need - Don't leave home without it !!  (think of it like the spare tire for your car)    Please schedule a follow up office visit in 6 months, call sooner if needed with all medications /inhalers/ solutions in hand so we can verify exactly what you are taking. This includes all medications from all doctors and over the [place] separate them into two bags:  the ones you take automatically, no matter what, vs the ones you take just when you feel you need them "BAG #2 is UP TO YOU"  - this will really help [place] help you take your medications more effectively.

## 2019-08-28 NOTE — Progress Notes (Signed)
Subjective:    Patient ID: Tim [name], male    DOB: 28-Jan-1929  MRN: [id number]  Synopsis: Former patient of Dr. Gwenette Greet quit smoking 1977  COPD and a paralyzed R Hemidiaphragm as seen on fluoroscopy in 2007. Lung function testing in 2005 showed  airflow obstruction, FEV1 of 1.53 L (43% predicted), total lung capacity 62% predicted, DLCO 73% predicted. 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   Had CABG by Dr. Servando Snare in [id number] complicated by a pericardial effusion.  Found to have evidence of pharygneal phase dysphagia and aspiraiton on modified barium swallow in 04/2018.       08/28/2019  f/u ov/Wert re:  New pt evaluation Chief Complaint  Patient presents with  . Follow-up    Pt states he has been doing good since last visit. States he still wheezes occ and coughs but it is no different than before.  Dyspnea:  Limited more arthritis / 100 ft to shop has to stop  Cough: min esp in am / clear mucus  Sleeping: 45 degrees in recliner most nights due to sob  = baseline  SABA use: several times a week but thoroughly confused with meds/ daughter helps but granddaughter brought him taking multiple forms of laba/ics  02: none Chronic L CP since ? cabg no pleuritic or ex and along incision line = stings   No obvious day to day or daytime variability or assoc excess/ purulent sputum or mucus plugs or hemoptysis   or chest tightness, or overt sinus or hb symptoms.   Sleeping as above  without nocturnal  or early am exacerbation  of respiratory  c/o's or need for noct saba. Also denies any obvious fluctuation of symptoms with weather or environmental changes or other aggravating or alleviating factors except as outlined above   No unusual exposure hx or h/o childhood pna/ asthma or knowledge of premature birth.  Current Allergies, Complete Past Medical History, Past Surgical History, Family History, and Social History were reviewed in [facility] record.  ROS  The following are not active complaints unless bolded Hoarseness, sore throat, dysphagia, dental problems, itching, sneezing,  nasal congestion or discharge of excess mucus or purulent secretions, ear ache,   fever, chills, sweats, unintended wt loss or wt gain, classically pleuritic or exertional cp,  orthopnea pnd or arm/hand swelling  or leg swelling, presyncope, palpitations, abdominal pain, anorexia, nausea, vomiting, diarrhea  or change in bowel habits or change in bladder habits, change in stools or change in urine, dysuria, hematuria,  rash, arthralgias, visual complaints, headache, numbness, weakness or ataxia or problems with walking or coordination,  change in mood or  memory.        Current Meds  Medication Sig  . acetaminophen (TYLENOL) 500 MG tablet Take 500 mg by mouth every 4 (four) hours as needed for mild pain.  [name] albuterol (PROVENTIL HFA;VENTOLIN HFA) 108 (90 BASE) MCG/ACT inhaler Inhale 2 puffs into the lungs every 6 (six) hours as needed for wheezing or shortness of breath.  [name] arformoterol (BROVANA) 15 MCG/2ML NEBU Take 2 mLs (15 mcg total) by nebulization 2 (two) times daily.  [name] aspirin EC 81 MG tablet Take 81 mg by mouth daily.  . budesonide (PULMICORT) 0.5 MG/2ML nebulizer solution Take 2 mLs (0.5 mg total) by nebulization 2 (two) times daily.  [name] diltiazem (TIAZAC) 120 MG 24 hr capsule Take 1 capsule (120 mg total) by mouth daily.  [name] doxycycline (VIBRAMYCIN)  50 MG capsule Take 50 mg by mouth daily.   . finasteride (PROSCAR) 5 MG tablet Take 10 mg by mouth daily.   . fluticasone furoate-vilanterol (BREO ELLIPTA) 200-25 MCG/INH AEPB Inhale 1 puff into the lungs daily.  . furosemide (LASIX) 40 MG tablet TAKE 1 TABLET (40 MG TOTAL) BY MOUTH DAILY AS NEEDED FOR FLUID.  [name] ipratropium (ATROVENT) 0.03 % nasal spray Place 2 sprays into both nostrils every 12 (twelve) hours.  [name] KLOR-CON M20 20 MEQ tablet TAKE 1 TABLET BY MOUTH EVERY DAY  .  levalbuterol (XOPENEX) 0.63 MG/3ML nebulizer solution Take 3 mLs (0.63 mg total) by nebulization every 4 (four) hours as needed for wheezing or shortness of breath.  . montelukast (SINGULAIR) 10 MG tablet TAKE 1 TABLET BY MOUTH EVERYDAY AT BEDTIME  . omeprazole (PRILOSEC) 40 MG capsule TAKE 1 CAPSULE BY MOUTH TWICE A DAY  . sodium chloride HYPERTONIC 3 % nebulizer solution Take by nebulization 2 (two) times daily.  . tamsulosin (FLOMAX) 0.4 MG CAPS capsule Take 0.4 mg by mouth 2 (two) times daily. For urinary symptoms        Past Medical History:  Diagnosis Date  . AAA (abdominal aortic aneurysm) (HCC)    3.2 cm 07/2016; 2-3 year f/u recommended  . Asthma   . BPH (benign prostatic hypertrophy)   . CAD (coronary artery disease)    9 STEMI 2001, PCI, RCA, residual 100% circumflex  /   PCI for in-stent restenosis June, 2001  /  nuclear February, 2010, no ischemia  . Cancer ([place])    skin cancer removed  . Carotid artery disease ([place])    Doppler, June, 2011, 0-39% bilateral, pt denies  . Cervical disc disease    Status post neurosurgery  . Colon polyps   . Ejection fraction    EF 55-65%, echo, 2009  . Elevated CPK    Chronic mild CPK elevation  . Emphysema    Mild  . Fatigue    Morning fatigue, August, 2011  . GERD (gastroesophageal reflux disease)   . Headache    back of head  . Hyperlipidemia   . Hypertension   . Low back pain    February, 2013  . Myocardial infarction ([place])   . Paralyzed hemidiaphragm    Right  . Paralyzed hemidiaphragm    Chronic  . Statin intolerance    Elevated CPK in the past         Objective:    amb elderly  Easily confused with details of care/ gddaughter Sabra  Wt Readings from Last 3 Encounters:  08/28/19 180 lb 12.8 oz (82 kg)  05/24/19 180 lb (81.6 kg)  05/11/19 176 lb 12.8 oz (80.2 kg)     Vital signs reviewed - Note on arrival 02 sats  99% on RA     HEENT : pt wearing mask not removed for exam due to covid -19 concerns.     NECK :  without JVD/Nodes/TM/ nl carotid upstrokes bilaterally   LUNGS: no acc muscle use,  Mod barrel  contour chest wall with bilateral  Distant bs s audible wheeze and  without cough on insp or exp maneuvers and mod  Hyperresonant  to  percussion bilaterally  / decreased bs R base    CV:  RRR  no s3 or murmur or increase in P2, and no edema   ABD:  soft and nontender with pos mid insp Hoover's  in the supine position. No bruits or organomegaly appreciated, bowel sounds  nl  MS:     ext warm without deformities, calf tenderness, cyanosis or clubbing No obvious joint restrictions   SKIN: warm and dry without lesions    NEURO:  alert, approp, nl sensorium with  no motor or cerebellar deficits apparent.       Chest imaging 01/2016 CXR > granuloma left upper lobe November 2018 CT chest: Less than 4 mm solid pleural-based right upper lobe nodule noted, groundglass nodule 11.9 mm right upper lobe, centrilobular emphysema noted, nonspecific scarring in the bases.  Images independently reviewed by me November 10, 2017 CT chest images independently reviewed showing mild to moderate centrilobular emphysema, atelectasis in the bases, left upper lobe nodule 12 mm in size new from prior, stable right lung groundglass nodule is dating back to 2008, moderate to large loculated pericardial effusion noted 04/2018 CT chest > new RUL spiculated nodule, 2.2 x 3.7 cm bilateral uper lobe nodules, emphysema, images independently reviewed October 2019 CT chest showed persistent nodule 3.9 cm in the right upper lobe, stable lower lobe nodules, emphysema noted. Personally reviewed 09/09/2018 PET/CT> decreasing size mass, low FDG Avidity images independently reviewed, there is a 3.7 x 1.3 nodular opacity in the right upper lobe which has decreased in size compared to previous   I personally reviewed images and agree with radiology impression as follows:   Chest CT 08/15/2019 1. Status post coronary artery bypass  graft. 2. Stable chronic appearing loculated pericardial effusion is noted. 3. Stable mildly enlarged mediastinal adenopathy is noted which most likely is reactive in etiology. 4. Stable bilobed masslike density is noted anteriorly in the right upper lobe which most likely represents postinfectious scarring. Stable ground-glass opacity is seen in right upper lobe measuring 16 x 13 mm. 5. New 8 x 5 mm subpleural nodule is noted medially and posteriorly in the right lower lobe. Follow-up unenhanced chest CT in 6 months is recommended to ensure stability and rule out neoplasm. 6. Emphysema and aortic atherosclerosis.   Labs: 08/2017 IgE normal January 2019 IgE normal  Exhaled nitric oxide test: January 2018 22 ppm    Orther imaging: 04/2018 Barium swallow and modified barium swallow showed gross aspiration, therapy and instruction offered.  Records from his hospitalization on January 2020 reviewed where he was seen for small bowel obstruction versus ileus and treated with conservative management.  He had resolution of symptoms with IV fluids       Assessment & Plan:

## 2019-08-30 ENCOUNTER — Encounter: Payer: Self-pay | Admitting: Internal Medicine

## 2019-08-30 NOTE — Assessment & Plan Note (Signed)
Quit smoking 1977 Fluoro 2007: paralyzed right HD PFT's 05/02/2004:  FEV1 1.53 (43%), ratio 60, TLC 62%, DLCO 73%  I have major concerns re medication reconcilation in this pt but overall seems to be doing well on whatever it is he is actually taking.  I spent a total of 40 minutes in office with ggdaughter and another 64 with daughter over the phone and made very little progress with this issue so rec:   To keep things simple, I have asked the patient to first separate medicines that are perceived as maintenance, that is to be taken daily "no matter what", from those medicines that are taken on only on an as-needed basis and I have given the patient /fm examples of both, and then fax me the list separated that way once they get home and review each bottle  For now no change rx  Pt informed of the seriousness of COVID 19 infection as a direct risk to their health  and safey and to those of their loved ones and should continue to wear facemask in public and minimize exposure to public locations but especially avoid any area or activity where non-close contacts are not observing distancing or wearing an appropriate face mask.   >>> ov in 6 m, call sooner if needed    I had an extended discussion with the patient reviewing all relevant studies completed to date and  lasting 25 minutes of a 40  minute office visit with pt new to me    re  severe non-specific but potentially very serious refractory respiratory symptoms of uncertain and potentially multiple  etiologies.   Each maintenance medication was reviewed in detail including most importantly the difference between maintenance and prns and under what circumstances the prns are to be triggered using an action plan format that is not reflected in the computer generated alphabetically organized AVS.    Please see AVS for specific instructions unique to this office visit that I personally wrote and verbalized to the the pt in detail and then reviewed  with pt  by my nurse highlighting any changes in therapy/plan of care  recommended at today's visit.

## 2019-08-31 ENCOUNTER — Other Ambulatory Visit: Payer: Self-pay | Admitting: Pulmonary Disease

## 2019-09-18 ENCOUNTER — Encounter (INDEPENDENT_AMBULATORY_CARE_PROVIDER_SITE_OTHER): Payer: Medicare Other | Admitting: Ophthalmology

## 2019-09-18 DIAGNOSIS — H43813 Vitreous degeneration, bilateral: Secondary | ICD-10-CM | POA: Diagnosis not present

## 2019-09-18 DIAGNOSIS — H33301 Unspecified retinal break, right eye: Secondary | ICD-10-CM | POA: Diagnosis not present

## 2019-09-18 DIAGNOSIS — H353112 Nonexudative age-related macular degeneration, right eye, intermediate dry stage: Secondary | ICD-10-CM

## 2019-09-18 DIAGNOSIS — H353221 Exudative age-related macular degeneration, left eye, with active choroidal neovascularization: Secondary | ICD-10-CM | POA: Diagnosis not present

## 2019-09-20 ENCOUNTER — Other Ambulatory Visit: Payer: Self-pay | Admitting: Primary Care

## 2019-09-20 DIAGNOSIS — J449 Chronic obstructive pulmonary disease, unspecified: Secondary | ICD-10-CM

## 2019-09-28 ENCOUNTER — Other Ambulatory Visit: Payer: Self-pay | Admitting: Pulmonary Disease

## 2019-09-28 DIAGNOSIS — J449 Chronic obstructive pulmonary disease, unspecified: Secondary | ICD-10-CM

## 2019-10-11 NOTE — Progress Notes (Signed)
CARDIOLOGY OFFICE NOTE  Date:  10/18/2019    Tim [name] Date of Birth: [birth year] Medical Record [id number]  PCP:  Tim Bott, MD  Cardiologist:  [facility]  Chief Complaint  Patient presents with  . Follow-up    Seen for Dr. Marlou Porch    History of Present Illness: Tim [name] is a 86 y.o. male who presents today for a 6 month check. Seen for Dr. Marlou Porch.   He has a history of bilateral carotid artery disease, COPD, HLD with some statin intolerance & CPK elevation and intolerance to PCSK9 therapy, CAD with prior STEMI in 2001 with PCI to RCA, residual 100% circumflex, subsequent PCI for in-stent restenosis in June 2001, nuclear in 2014 with no scar, no ischemia 62%,with subsequent CABG x 4 by Dr. Servando Snare in 08/2017 with LIMA to LAD, SVG to DX, SVG to LCX and SVG to PD, and left localized pericardial effusion chronic.  Former smoker - quit in 1980. Also with noted paralyzed right hemidiaphragm.   Last seen by Dr. Marlou Porch in August of 2019. He saw Tim [name] this past August of 2020 - has had issues with anemia, chronic dyspnea but overall felt to be ok.   The patient does not have symptoms concerning for COVID-19 infection (fever, chills, cough, or new shortness of breath).   Comes in today. Here with his daughter Tim [name]. They both feel like he is doing well. No chest pain. Breathing is stable. He has some chronic phlegm. He is getting shots in his eyes for macular degeneration. He loves spending time in his shop - does woodwork and is building bird houses. Not dizzy. Some generalized fatigue that he attributes to his age. Overall, no real issues noted. Daughter notes he sees his PCP pretty regularly and has labs done thru him.   Past Medical History:  Diagnosis Date  . AAA (abdominal aortic aneurysm) (HCC)    3.2 cm 07/2016; 2-3 year f/u recommended  . Asthma   . BPH (benign prostatic hypertrophy)   . CAD (coronary artery disease)    9 STEMI 2001, PCI, RCA, residual  100% circumflex  /   PCI for in-stent restenosis June, 2001  /  nuclear February, 2010, no ischemia  . Cancer ([place])    skin cancer removed  . Carotid artery disease ([place])    Doppler, June, 2011, 0-39% bilateral, pt denies  . Cervical disc disease    Status post neurosurgery  . Colon polyps   . Ejection fraction    EF 55-65%, echo, 2009  . Elevated CPK    Chronic mild CPK elevation  . Emphysema    Mild  . Fatigue    Morning fatigue, August, 2011  . GERD (gastroesophageal reflux disease)   . Headache    back of head  . Hyperlipidemia   . Hypertension   . Low back pain    February, 2013  . Myocardial infarction ([place])   . Paralyzed hemidiaphragm    Right  . Paralyzed hemidiaphragm    Chronic  . Statin intolerance    Elevated CPK in the past    Past Surgical History:  Procedure Laterality Date  . BREAST SURGERY     right breast removed  . CERVICAL DISC SURGERY    . CORONARY ARTERY BYPASS GRAFT N/A 08/24/2017   Procedure: CORONARY ARTERY BYPASS GRAFTING times four using left internal mammary artery and bilateral saphenous vein, using endoscope. TEE;  Surgeon: Grace Isaac, MD;  Location: [place];  Service: Open Heart Surgery;  Laterality: N/A;  . LEFT HEART CATH AND CORONARY ANGIOGRAPHY N/A 07/29/2017   Procedure: LEFT HEART CATH AND CORONARY ANGIOGRAPHY;  Surgeon: Nelva Bush, MD;  Location: [place] [facility];  Service: Cardiovascular;  Laterality: N/A;  . SKIN CANCER EXCISION    . TEE WITHOUT CARDIOVERSION N/A 08/24/2017   Procedure: TRANSESOPHAGEAL ECHOCARDIOGRAM (TEE);  Surgeon: Grace Isaac, MD;  Location: [place];  Service: Open Heart Surgery;  Laterality: N/A;     Medications: Current Meds  Medication Sig  . acetaminophen (TYLENOL) 500 MG tablet Take 500 mg by mouth every 4 (four) hours as needed for mild pain.  [name] albuterol (PROVENTIL HFA;VENTOLIN HFA) 108 (90 BASE) MCG/ACT inhaler Inhale 2 puffs into the lungs every 6 (six) hours as needed for  wheezing or shortness of breath.  [name] arformoterol (BROVANA) 15 MCG/2ML NEBU Take 2 mLs (15 mcg total) by nebulization 2 (two) times daily.  [name] aspirin EC 81 MG tablet Take 81 mg by mouth daily.  . budesonide (PULMICORT) 0.5 MG/2ML nebulizer solution Take 2 mLs (0.5 mg total) by nebulization 2 (two) times daily.  [name] diltiazem (TIAZAC) 120 MG 24 hr capsule Take 1 capsule (120 mg total) by mouth daily.  [name] doxycycline (VIBRAMYCIN) 50 MG capsule Take 50 mg by mouth daily.   . finasteride (PROSCAR) 5 MG tablet Take 10 mg by mouth daily.   . fluticasone furoate-vilanterol (BREO ELLIPTA) 200-25 MCG/INH AEPB INHALE 1 PUFF BY MOUTH EVERY DAY  . furosemide (LASIX) 40 MG tablet TAKE 1 TABLET (40 MG TOTAL) BY MOUTH DAILY AS NEEDED FOR FLUID.  [name] ipratropium (ATROVENT) 0.03 % nasal spray Place 2 sprays into both nostrils every 12 (twelve) hours.  [name] KLOR-CON M20 20 MEQ tablet TAKE 1 TABLET BY MOUTH EVERY DAY  . levalbuterol (XOPENEX) 0.63 MG/3ML nebulizer solution INHALE 3 MLS BY NEBULIZATION EVERY 4 (FOUR) HOURS AS NEEDED FOR WHEEZING OR SHORTNESS OF BREATH.  . montelukast (SINGULAIR) 10 MG tablet TAKE 1 TABLET BY MOUTH EVERYDAY AT BEDTIME  . omeprazole (PRILOSEC) 40 MG capsule TAKE 1 CAPSULE BY MOUTH TWICE A DAY  . sodium chloride HYPERTONIC 3 % nebulizer solution Take by nebulization 2 (two) times daily.  . tamsulosin (FLOMAX) 0.4 MG CAPS capsule Take 0.4 mg by mouth 2 (two) times daily. For urinary symptoms      Allergies: Allergies  Allergen Reactions  . Statins Other (See Comments)    CLASS EFFECT SEVERE LEG CRAMPS [MULTIPLE STATINS]  . Repatha [Evolocumab]     Flu like symptoms    Social History: The patient  reports that he quit smoking about 44 years ago. His smoking use included cigarettes. He has a 120.00 pack-year smoking history. He has never used smokeless tobacco. He reports that he does not drink alcohol or use drugs.   Family History: The patient's family history includes Cancer in his  brother; Diabetes in his mother and sister; Kidney disease in his brother; Other in his father and sister.   Review of Systems: Please see the history of present illness.   All other systems are reviewed and negative.   Physical Exam: VS:  BP 116/68   Pulse 83   Wt 187 lb (84.8 kg)   SpO2 97%   BMI 26.83 kg/m  .  BMI Body mass index is 26.83 kg/m.  Wt Readings from Last 3 Encounters:  10/18/19 187 lb (84.8 kg)  08/28/19 180 lb 12.8 oz (82 kg)  05/24/19 180 lb (81.6 kg)  General: Pleasant. Alert and in no acute distress. He looks younger than his stated age. Little hard of hearing.   HEENT: Normal.  Neck: Supple, no JVD, carotid bruits, or masses noted.  Cardiac: Regular rate and rhythm. No murmurs, rubs, or gallops. No edema. Sternum looks fine.  Respiratory:  Lungs are clear to auscultation bilaterally with normal work of breathing.  GI: Soft and nontender.  MS: No deformity or atrophy. Gait and ROM intact.  Skin: Warm and dry. Color is normal.  Neuro:  Strength and sensation are intact and no gross focal deficits noted.  Psych: Alert, appropriate and with normal affect.   LABORATORY DATA:  EKG:  EKG is ordered today. This demonstrates NSR - old anteroseptal MI with non specific ST changes - overall unchanged - HR is 83.  Lab Results  Component Value Date   WBC 10.0 10/04/2018   HGB 9.9 (L) 10/04/2018   HCT 32.3 (L) 10/04/2018   PLT 305 10/04/2018   GLUCOSE 103 (H) 10/05/2018   CHOL 152 05/09/2018   TRIG 82 05/09/2018   HDL 38 (L) 05/09/2018   LDLCALC 98 05/09/2018   ALT 18 10/03/2018   AST 17 10/03/2018   NA 140 10/05/2018   K 4.0 10/05/2018   CL 108 10/05/2018   CREATININE 1.21 10/05/2018   BUN 24 (H) 10/05/2018   CO2 24 10/05/2018   TSH 2.942 08/31/2017   INR 1.39 08/24/2017   HGBA1C 6.4 (H) 08/20/2017     BNP (last 3 results) No results for input(s): BNP in the last 8760 hours.  ProBNP (last 3 results) No results for input(s): PROBNP in the last  8760 hours.   Other Studies Reviewed Today:  Carotid Doppler Summary 05/2019:  Right Carotid: Velocities in the right ICA are consistent with a 1-39%  stenosis.         The ECA appears >50% stenosed. The RICA velocities are  stable         compared to prior exam and are within normal limits.   Left Carotid: Velocities in the left ICA are consistent with a 40-59%  stenosis.        The ECA appears >50% stenosed. The LICA velocities are  elevated        and have increased when compared to the prior exam.   Vertebrals: Bilateral vertebral arteries demonstrate antegrade flow.  Subclavians: Right subclavian artery was stenotic. Bilateral subclavian  artery        flow was disturbed. Mild velocities decrease noted in the  right        subclavian artery; moderate to significant decrease noted on  the        left.   *See table(s) above for measurements and observations.  Suggest follow up study in 12 months.    Electronically signed by Larae Grooms MD on 05/18/2019 at 8:13:39 AM.   TTE 11/11/17  Study Conclusions  - Left ventricle: The cavity size was normal. Wall thickness was normal. Systolic function was normal. The estimated ejection fraction was in the range of 60% to 65%. Doppler parameters are consistent with both elevated ventricular end-diastolic filling pressure and elevated left atrial filling pressure. - Left atrium: The atrium was mildly dilated. - Atrial septum: No defect or patent foramen ovale was identified. - Pericardium, extracardiac: Moderate localized posterior lateral pericardial effusion no tamponade    ASSESSMENT AND PLAN:  1.  CAD with prior CABG 2018 - doing well clinically - continue with his current regimen. He does  pretty well overall.   2. COPD - chronic DOE - this is unchanged.   3. Anemia - iron deficient - labs are checked by PCP.   4. PAF - in sinus today by exam  and EKG.   5. HLD - intolerant to statin/Repatha - his labs are checked by PCP.   6. Carotid disease - last done in 05/2019 - to be updated later this year.   7. Chronic pericardial effusion - no problems noted - his clinically status seems stable.   8. COVID-19 Education: The signs and symptoms of COVID-19 were discussed with the patient and how to seek care for testing (follow up with PCP or arrange E-visit).  The importance of social distancing, staying at home, hand hygiene and wearing a mask when out in public were discussed today. He has had his first vaccine and has his 2nd scheduled.   Current medicines are reviewed with the patient today.  The patient does not have concerns regarding medicines other than what has been noted above.  The following changes have been made:  See above.  Labs/ tests ordered today include:    Orders Placed This Encounter  Procedures  . EKG 12-Lead     Disposition:   FU with Dr. Marlou Porch in 6 months.   Patient is agreeable to this plan and will call if any problems develop in the interim.   SignedTruitt Merle, NP  10/18/2019 9:14 AM  [facility] [address] [place], [place]  [address] Phone: [phone] Fax: [phone]

## 2019-10-14 ENCOUNTER — Other Ambulatory Visit: Payer: Self-pay | Admitting: Cardiology

## 2019-10-14 ENCOUNTER — Other Ambulatory Visit: Payer: Self-pay | Admitting: Pulmonary Disease

## 2019-10-16 ENCOUNTER — Encounter (INDEPENDENT_AMBULATORY_CARE_PROVIDER_SITE_OTHER): Payer: Medicare Other | Admitting: Ophthalmology

## 2019-10-16 ENCOUNTER — Ambulatory Visit: Payer: Medicare Other | Admitting: Nurse Practitioner

## 2019-10-16 DIAGNOSIS — H353221 Exudative age-related macular degeneration, left eye, with active choroidal neovascularization: Secondary | ICD-10-CM | POA: Diagnosis not present

## 2019-10-16 DIAGNOSIS — H353112 Nonexudative age-related macular degeneration, right eye, intermediate dry stage: Secondary | ICD-10-CM

## 2019-10-16 DIAGNOSIS — H43813 Vitreous degeneration, bilateral: Secondary | ICD-10-CM | POA: Diagnosis not present

## 2019-10-17 ENCOUNTER — Telehealth: Payer: Self-pay | Admitting: Cardiology

## 2019-10-17 ENCOUNTER — Ambulatory Visit: Payer: Medicare Other | Admitting: Nurse Practitioner

## 2019-10-17 NOTE — Telephone Encounter (Signed)
Patient's daughter, [name], is requesting to come with patient to his appt tomorrow with Truitt Merle due to the patient not hearing well.

## 2019-10-18 ENCOUNTER — Other Ambulatory Visit: Payer: Self-pay

## 2019-10-18 ENCOUNTER — Encounter: Payer: Self-pay | Admitting: Nurse Practitioner

## 2019-10-18 ENCOUNTER — Ambulatory Visit (INDEPENDENT_AMBULATORY_CARE_PROVIDER_SITE_OTHER): Payer: Medicare Other | Admitting: Nurse Practitioner

## 2019-10-18 VITALS — BP 116/68 | HR 83 | Wt 187.0 lb

## 2019-10-18 DIAGNOSIS — E782 Mixed hyperlipidemia: Secondary | ICD-10-CM

## 2019-10-18 DIAGNOSIS — Z951 Presence of aortocoronary bypass graft: Secondary | ICD-10-CM

## 2019-10-18 DIAGNOSIS — Z7189 Other specified counseling: Secondary | ICD-10-CM

## 2019-10-18 DIAGNOSIS — I3139 Other pericardial effusion (noninflammatory): Secondary | ICD-10-CM

## 2019-10-18 DIAGNOSIS — Z789 Other specified health status: Secondary | ICD-10-CM

## 2019-10-18 DIAGNOSIS — I313 Pericardial effusion (noninflammatory): Secondary | ICD-10-CM

## 2019-10-18 DIAGNOSIS — I480 Paroxysmal atrial fibrillation: Secondary | ICD-10-CM

## 2019-10-18 DIAGNOSIS — I2510 Atherosclerotic heart disease of native coronary artery without angina pectoris: Secondary | ICD-10-CM

## 2019-10-18 DIAGNOSIS — I10 Essential (primary) hypertension: Secondary | ICD-10-CM

## 2019-10-18 DIAGNOSIS — D509 Iron deficiency anemia, unspecified: Secondary | ICD-10-CM

## 2019-10-18 NOTE — Patient Instructions (Addendum)
After Visit Summary:  We will be checking the following labs today - NONE   Medication Instructions:    Continue with your current medicines.    If you need a refill on your cardiac medications before your next appointment, please call your pharmacy.     Testing/Procedures To Be Arranged:  N/A  Follow-Up:   See me in 6 months - bring me a birdhouse - I would love to buy one from you.     At [facility], you and your health needs are our priority.  As part of our continuing mission to provide you with exceptional heart care, we have created designated Provider Care Teams.  These Care Teams include your primary Cardiologist (physician) and Advanced Practice Providers (APPs -  Physician Assistants and Nurse Practitioners) who all work together to provide you with the care you need, when you need it.  Special Instructions:  . Stay safe, stay home, wash your hands for at least 20 seconds and wear a mask when out in public.  . It was good to talk with you both today.  [name] Keep up the good work!   Call the [place] office at [phone] if you have any questions, problems or concerns.

## 2019-10-20 ENCOUNTER — Other Ambulatory Visit: Payer: Self-pay | Admitting: Cardiology

## 2019-10-20 MED ORDER — DILTIAZEM HCL ER BEADS 120 MG PO CP24: 120.0000 mg | ORAL_CAPSULE | Freq: Every day | ORAL | 3 refills | Status: DC

## 2019-10-20 NOTE — Telephone Encounter (Signed)
Pt's medication was sent to pt's pharmacy as requested. Confirmation received.  °

## 2019-10-26 ENCOUNTER — Other Ambulatory Visit: Payer: Self-pay | Admitting: Cardiology

## 2019-11-15 ENCOUNTER — Other Ambulatory Visit: Payer: Self-pay

## 2019-11-15 ENCOUNTER — Encounter (INDEPENDENT_AMBULATORY_CARE_PROVIDER_SITE_OTHER): Payer: Medicare Other | Admitting: Ophthalmology

## 2019-11-15 DIAGNOSIS — H33301 Unspecified retinal break, right eye: Secondary | ICD-10-CM | POA: Diagnosis not present

## 2019-11-15 DIAGNOSIS — H43813 Vitreous degeneration, bilateral: Secondary | ICD-10-CM | POA: Diagnosis not present

## 2019-11-15 DIAGNOSIS — H353112 Nonexudative age-related macular degeneration, right eye, intermediate dry stage: Secondary | ICD-10-CM

## 2019-11-15 DIAGNOSIS — H353221 Exudative age-related macular degeneration, left eye, with active choroidal neovascularization: Secondary | ICD-10-CM | POA: Diagnosis not present

## 2019-12-20 ENCOUNTER — Encounter (INDEPENDENT_AMBULATORY_CARE_PROVIDER_SITE_OTHER): Payer: Medicare Other | Admitting: Ophthalmology

## 2019-12-20 DIAGNOSIS — H353221 Exudative age-related macular degeneration, left eye, with active choroidal neovascularization: Secondary | ICD-10-CM

## 2019-12-20 DIAGNOSIS — H353112 Nonexudative age-related macular degeneration, right eye, intermediate dry stage: Secondary | ICD-10-CM | POA: Diagnosis not present

## 2019-12-20 DIAGNOSIS — H33301 Unspecified retinal break, right eye: Secondary | ICD-10-CM

## 2019-12-20 DIAGNOSIS — H43813 Vitreous degeneration, bilateral: Secondary | ICD-10-CM | POA: Diagnosis not present

## 2020-01-17 ENCOUNTER — Encounter (INDEPENDENT_AMBULATORY_CARE_PROVIDER_SITE_OTHER): Payer: Medicare Other | Admitting: Ophthalmology

## 2020-01-17 DIAGNOSIS — H33301 Unspecified retinal break, right eye: Secondary | ICD-10-CM | POA: Diagnosis not present

## 2020-01-17 DIAGNOSIS — H353221 Exudative age-related macular degeneration, left eye, with active choroidal neovascularization: Secondary | ICD-10-CM

## 2020-01-17 DIAGNOSIS — H353112 Nonexudative age-related macular degeneration, right eye, intermediate dry stage: Secondary | ICD-10-CM

## 2020-01-17 DIAGNOSIS — H43813 Vitreous degeneration, bilateral: Secondary | ICD-10-CM | POA: Diagnosis not present

## 2020-02-14 ENCOUNTER — Encounter (INDEPENDENT_AMBULATORY_CARE_PROVIDER_SITE_OTHER): Payer: Medicare Other | Admitting: Ophthalmology

## 2020-02-14 ENCOUNTER — Other Ambulatory Visit: Payer: Self-pay

## 2020-02-14 DIAGNOSIS — H353221 Exudative age-related macular degeneration, left eye, with active choroidal neovascularization: Secondary | ICD-10-CM

## 2020-02-14 DIAGNOSIS — H33301 Unspecified retinal break, right eye: Secondary | ICD-10-CM | POA: Diagnosis not present

## 2020-02-14 DIAGNOSIS — H353112 Nonexudative age-related macular degeneration, right eye, intermediate dry stage: Secondary | ICD-10-CM | POA: Diagnosis not present

## 2020-02-14 DIAGNOSIS — H43813 Vitreous degeneration, bilateral: Secondary | ICD-10-CM

## 2020-02-16 ENCOUNTER — Other Ambulatory Visit: Payer: Medicare Other

## 2020-02-26 ENCOUNTER — Ambulatory Visit: Payer: Medicare Other | Admitting: Adult Health

## 2020-02-26 ENCOUNTER — Ambulatory Visit: Payer: Medicare Other | Admitting: Internal Medicine

## 2020-02-27 ENCOUNTER — Ambulatory Visit: Payer: Medicare Other | Admitting: Adult Health

## 2020-03-01 ENCOUNTER — Other Ambulatory Visit: Payer: Self-pay

## 2020-03-01 ENCOUNTER — Ambulatory Visit (INDEPENDENT_AMBULATORY_CARE_PROVIDER_SITE_OTHER)
Admission: RE | Admit: 2020-03-01 | Discharge: 2020-03-01 | Disposition: A | Discharge status: Home / Self Care | Payer: Medicare Other | Source: Ambulatory Visit | Attending: Primary Care | Admitting: Primary Care

## 2020-03-01 DIAGNOSIS — R918 Other nonspecific abnormal finding of lung field: Secondary | ICD-10-CM | POA: Diagnosis not present

## 2020-03-04 ENCOUNTER — Ambulatory Visit: Payer: Medicare Other | Admitting: Adult Health

## 2020-03-04 ENCOUNTER — Telehealth: Payer: Self-pay | Admitting: Primary Care

## 2020-03-04 NOTE — Telephone Encounter (Signed)
Received call report from [place] with [facility] on patient's Chest CT done on 03/01/2020. Beth please review the result/impression copied below:  IMPRESSION: 1. 13 x 10 mm spiculated density is now seen in the right upper lobe in the area of previously noted ground-glass opacity. This is highly concerning for malignancy and PET scan is recommended for further evaluation. These results will be called to the ordering clinician or representative by the Radiologist Assistant, and communication documented in the PACS or zVision Dashboard. 2. Stable mediastinal adenopathy is noted which most likely is reactive in etiology. 3. Stable loculated pericardial effusion is noted posteriorly.

## 2020-03-04 NOTE — Telephone Encounter (Signed)
Patient has an apt with Dr. Melvyn Novas tomorrow at [place],  we will discuss next steps at that visit. Make sure he keeps visit

## 2020-03-05 ENCOUNTER — Encounter: Payer: Self-pay | Admitting: Internal Medicine

## 2020-03-05 ENCOUNTER — Other Ambulatory Visit: Payer: Self-pay

## 2020-03-05 ENCOUNTER — Ambulatory Visit (INDEPENDENT_AMBULATORY_CARE_PROVIDER_SITE_OTHER): Payer: Medicare Other | Admitting: Internal Medicine

## 2020-03-05 DIAGNOSIS — R058 Other specified cough: Secondary | ICD-10-CM

## 2020-03-05 DIAGNOSIS — R05 Cough: Secondary | ICD-10-CM

## 2020-03-05 DIAGNOSIS — J449 Chronic obstructive pulmonary disease, unspecified: Secondary | ICD-10-CM

## 2020-03-05 DIAGNOSIS — R0609 Other forms of dyspnea: Secondary | ICD-10-CM

## 2020-03-05 DIAGNOSIS — R911 Solitary pulmonary nodule: Secondary | ICD-10-CM

## 2020-03-05 DIAGNOSIS — R0600 Dyspnea, unspecified: Secondary | ICD-10-CM | POA: Diagnosis not present

## 2020-03-05 MED ORDER — TRELEGY ELLIPTA 100-62.5-25 MCG/INH IN AEPB: 1.0000 | INHALATION_SPRAY | Freq: Every day | RESPIRATORY_TRACT | 0 refills | Status: DC

## 2020-03-05 NOTE — Patient Instructions (Addendum)
Plan A = Automatic = Always=    Stop Breo and start Trelegy one click first thing each am then rinse and gargle  - if have more trouble with your urination then stop the trelegy and  Resume breo  Plan B = Backup (to supplement plan A, not to replace it) Only use your albuterol inhaler as a rescue medication to be used if you can't catch your breath by resting or doing a relaxed purse lip breathing pattern.  - The less you use it, the better it will work when you need it. - Ok to use the inhaler up to 2 puffs  every 4 hours if you must but call for appointment if use goes up over your usual need - Don't leave home without it !!  (think of it like the spare tire for your car)    Plan C = Crisis (instead of Plan B but only if Plan B stops working) - only use your levoalbuterol nebulizer if you first try Plan B and it fails to help > ok to use the nebulizer up to every 4 hours but if start needing it regularly call for immediate appointment  Prilosec (Omeprazole ) 40 mg  Take 30- 60 min before your first and last meals of the day   GERD (REFLUX)  is an extremely common cause of respiratory symptoms just like yours , many times with no obvious heartburn at all.    It can be treated with medication, but also with lifestyle changes including elevation of the head of your bed (ideally with 6 -8inch blocks under the headboard of your bed),  Smoking cessation, avoidance of late meals, excessive alcohol, and avoid fatty foods, chocolate, peppermint, colas, red wine, and acidic juices such as orange juice.  NO MINT OR MENTHOL PRODUCTS SO NO COUGH DROPS  USE SUGARLESS CANDY INSTEAD (Jolley ranchers or Stover's or Life Savers) or even ice chips will also do - the key is to swallow to prevent all throat clearing. NO OIL BASED VITAMINS - use powdered substitutes.  Avoid fish oil when coughing.   Please schedule a follow up office visit in 4 weeks, call sooner if needed with all medications /inhalers/ solutions  in hand so we can verify exactly what you are taking. This includes all medications from all doctors and over the [place] separate them into two bags:  the ones you take automatically, no matter what, vs the ones you take just when you feel you need them "BAG #2 is UP TO YOU"  - this will really help [place] help you take your medications more effectively.

## 2020-03-05 NOTE — Progress Notes (Signed)
Subjective:    Patient ID: Tim [name], male    DOB: [birth year]  MRN: [id number]  Synopsis: Former patient of Dr. Gwenette [name] quit smoking 1977  COPD and a paralyzed R Hemidiaphragm as seen on fluoroscopy in 2007. Lung function testing in 2005 showed  airflow obstruction, FEV1 of 1.53 L (43% predicted), total lung capacity 62% predicted, DLCO 73% predicted. 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   Had CABG by Dr. Servando [name] in [id number] complicated by a pericardial effusion.  Found to have evidence of pharygneal phase dysphagia and aspiraiton on modified barium swallow in 04/2018.       08/28/2019  f/u ov/Tim [name] re:  New pt evaluation Chief Complaint  Patient presents with  . Follow-up    Pt states he has been doing good since last visit. States he still wheezes occ and coughs but it is no different than before.  Dyspnea:  Limited more arthritis / 100 ft to shop has to stop  Cough: min esp in am / clear mucus  Sleeping: 45 degrees in recliner most nights due to sob  = baseline  SABA use: several times a week but thoroughly confused with meds/ daughter helps but granddaughter brought him taking multiple forms of laba/ics  02: none Chronic L CP since ? cabg no pleuritic or ex and along incision line = stings  rec You will need to call us the names of the medications you are taking regularly versus the ones you just take as needed when needed ( Albuterol = Proair) . Nebulized formoterol and budesonide   =   BREO Only use your albuterol (proair) as a rescue medication  Please schedule a follow up office visit in 6 months, call sooner if needed with all medications /inhalers/ solutions in hand so we can verify exactly what you are taking. This includes all medications from all doctors and over the [place] separate them into two bags:  the ones you take automatically, no matter what, vs the ones you take just when you feel you need them "BAG #2 is UP TO YOU"   - this will really help [place] help you take your medications more effectively.     03/05/2020  f/u ov/Tim [name] re:  GOLD III - had both shots/ no meds Chief Complaint  Patient presents with  . Follow-up    Coughing up mucus mostly at night.SOB with activity.  Dyspnea: now driving car to shed " my back and legs give out about the same time as my breathing @ > 100 ft slow / flat = MMRC3 = can't walk 100 yards even at a slow pace at a flat grade s stopping due to sob   Cough: feels  Some "congestion" minimal mucoid at hs mostly but does not typically wake him Sleeping: sleeps 45 degrees in recliner  SABA use: alb neb bid not prn,   02: no   No obvious day to day or daytime variability or assoc   purulent sputum or mucus plugs or hemoptysis or cp or chest tightness, subjective wheeze or overt sinus or hb symptoms.     Also denies any obvious fluctuation of symptoms with weather or environmental changes or other aggravating or alleviating factors except as outlined above   No unusual exposure hx or h/o childhood pna/ asthma or knowledge of premature birth.  Current Allergies, Complete Past Medical History, Past Surgical History, Family History, and Social History were reviewed in [employer]  electronic medical record.  ROS  The following are not active complaints unless bolded Hoarseness, sore throat, dysphagia, dental problems, itching, sneezing,  nasal congestion or discharge of excess mucus or purulent secretions, ear ache,   fever, chills, sweats, unintended wt loss or wt gain, classically pleuritic or exertional cp,  orthopnea pnd or arm/hand swelling  or leg swelling, presyncope, palpitations, abdominal pain, anorexia, nausea, vomiting, diarrhea  or change in bowel habits or change in bladder habits, change in stools or change in urine, dysuria, hematuria,  rash, arthralgias, visual complaints, headache, numbness, weakness or ataxia or problems with walking or coordination,  change in mood or   memory.        Current Meds  - NOTE:   Unable to verify as accurately reflecting what pt takes   pt does not know any names of meds and takes them on his own"according to what they're for"   Medication Sig  . acetaminophen (TYLENOL) 500 MG tablet Take 500 mg by mouth every 4 (four) hours as needed for mild pain.  [name] albuterol (PROVENTIL HFA;VENTOLIN HFA) 108 (90 BASE) MCG/ACT inhaler Inhale 2 puffs into the lungs every 6 (six) hours as needed for wheezing or shortness of breath.  [name] aspirin EC 81 MG tablet Take 81 mg by mouth daily.  . budesonide (PULMICORT) 0.5 MG/2ML nebulizer solution Take 2 mLs (0.5 mg total) by nebulization 2 (two) times daily.  [name] diltiazem (TIAZAC) 120 MG 24 hr capsule Take 1 capsule (120 mg total) by mouth daily.  [name] doxycycline (VIBRAMYCIN) 50 MG capsule Take 50 mg by mouth daily.   . finasteride (PROSCAR) 5 MG tablet Take 10 mg by mouth daily.   . fluticasone furoate-vilanterol (BREO ELLIPTA) 200-25 MCG/INH AEPB INHALE 1 PUFF BY MOUTH EVERY DAY  . furosemide (LASIX) 40 MG tablet TAKE 1 TABLET (40 MG TOTAL) BY MOUTH DAILY AS NEEDED FOR FLUID.  [name] KLOR-CON M20 20 MEQ tablet TAKE 1 TABLET BY MOUTH EVERY DAY  . omeprazole (PRILOSEC) 40 MG capsule TAKE 1 CAPSULE BY MOUTH TWICE A DAY  . sodium chloride HYPERTONIC 3 % nebulizer solution Take by nebulization 2 (two) times daily.  . tamsulosin (FLOMAX) 0.4 MG CAPS capsule Take 0.4 mg by mouth 2 (two) times daily. For urinary symptoms        Past Medical History:  Diagnosis Date  . AAA (abdominal aortic aneurysm) (HCC)    3.2 cm 07/2016; 2-3 year f/u recommended  . Asthma   . BPH (benign prostatic hypertrophy)   . CAD (coronary artery disease)    9 STEMI 2001, PCI, RCA, residual 100% circumflex  /   PCI for in-stent restenosis June, 2001  /  nuclear February, 2010, no ischemia  . Cancer ([place])    skin cancer removed  . Carotid artery disease ([place])    Doppler, June, 2011, 0-39% bilateral, pt denies  . Cervical disc disease     Status post neurosurgery  . Colon polyps   . Ejection fraction    EF 55-65%, echo, 2009  . Elevated CPK    Chronic mild CPK elevation  . Emphysema    Mild  . Fatigue    Morning fatigue, August, 2011  . GERD (gastroesophageal reflux disease)   . Headache    back of head  . Hyperlipidemia   . Hypertension   . Low back pain    February, 2013  . Myocardial infarction ([place])   . Paralyzed hemidiaphragm    Right  . Paralyzed hemidiaphragm  Chronic  . Statin intolerance    Elevated CPK in the past         Objective:    amb pleasant wm appears stated age/ nad walks with cane   03/05/2020       181   08/28/19 180 lb 12.8 oz (82 kg)  05/24/19 180 lb (81.6 kg)  05/11/19 176 lb 12.8 oz (80.2 kg)      Vital signs reviewed  03/05/2020  - Note at rest 02 sats  98% on RA      Reports edentulous  HEENT : pt wearing mask not removed for exam due to covid -19 concerns.    NECK :  without JVD/Nodes/TM/ nl carotid upstrokes bilaterally   LUNGS: no acc muscle use,  Mod barrel  contour chest wall with bilateral  Distant bs s audible wheeze and  without cough on insp or exp maneuvers and mod  Hyperresonant  to  percussion bilaterally     CV:  RRR  no s3 or murmur or increase in P2, and no edema   ABD:  soft and nontender with pos mid insp Hoover's  in the supine position. No bruits or organomegaly appreciated, bowel sounds nl  MS:     ext warm without deformities, calf tenderness, cyanosis or clubbing No obvious joint restrictions   SKIN: warm and dry without lesions    NEURO:  alert, approp, nl sensorium with  no motor or cerebellar deficits apparent.           Labs: 08/2017 IgE normal January 2019 IgE normal  Exhaled nitric oxide test: January 2018 22 ppm    Orther imaging: 04/2018 Barium swallow and modified barium swallow showed gross aspiration, therapy and instruction offered.      I personally reviewed images and agree with radiology impression as  follows:   Chest CT 03/01/20  1. 13 x 10 mm spiculated density is now seen in the right upper lobe in the area of previously noted ground-glass opacity. This is highly concerning for malignancy and PET scan is recommended for further evaluation. These results will be called to the ordering clinician or representative by the Radiologist Assistant, and communication documented in the PACS or zVision Dashboard. 2. Stable mediastinal adenopathy is noted which most likely is reactive in etiology. 3. Stable loculated pericardial effusion is noted posteriorly.     Assessment & Plan:

## 2020-03-06 ENCOUNTER — Encounter: Payer: Self-pay | Admitting: Internal Medicine

## 2020-03-06 DIAGNOSIS — R058 Other specified cough: Secondary | ICD-10-CM | POA: Insufficient documentation

## 2020-03-06 NOTE — Assessment & Plan Note (Signed)
Onset "after bypass" = 2018 - Found to have evidence of pharygneal phase dysphagia and aspiraiton on modified barium swallow in 04/2018.  - 03/05/2020  rec d/c breo 200, max rx for gerd   Upper airway cough syndrome (previously labeled PNDS),  is so named because it's frequently impossible to sort out how much is  CR/sinusitis with freq throat clearing (which can be related to primary GERD)   vs  causing  secondary (" extra esophageal")  GERD from wide swings in gastric pressure that occur with throat clearing, often  promoting self use of mint and menthol lozenges that reduce the lower esophageal sphincter tone and exacerbate the problem further in a cyclical fashion.   These are the same pts (now being labeled as having "irritable larynx syndrome" by some cough centers) who not infrequently have a history of having failed to tolerate ace inhibitors,  dry powder inhalers or biphosphonates or report having atypical/extraesophageal reflux symptoms that don't respond to standard doses of PPI  and are easily confused as having aecopd or asthma flares by even experienced allergists/ pulmonologists (myself included).   rec reduce ICS by half and rinse/ gargle vigorously, max gerd rx then regroup in 6 weeks with all meds in hand using a trust but verify approach to confirm accurate Medication  Reconciliation The principal here is that until we are certain that the  patients are doing what we've asked, it makes no sense to ask them to do more.

## 2020-03-06 NOTE — Assessment & Plan Note (Addendum)
Quit smoking 1977 Fluoro 2007: paralyzed right HD PFT's 05/02/2004:  FEV1 1.53 (43%), ratio 60, TLC 62%, DLCO 73% - 03/05/2020  After extensive coaching inhaler device,  effectiveness =    75% with hfa/ 90% with elipta > try trelegy    Group D in terms of symptom/risk and laba/lama/ICS  therefore appropriate rx at this point >>>  Try trelegy and stop breo 200 (may be irritating his upper airway more than helping his lower) unless worse urinary symptoms in which case Breo 100 better choice.   Extensive review with pt and daughter re maint vs prns I spent extra time with pt today reviewing appropriate use of albuterol for prn use on exertion with the following points: 1) saba is for relief of sob that does not improve by walking a slower pace or resting but rather if the pt does not improve after trying this first. 2) If the pt is convinced, as many are, that saba helps recover from activity faster then it's easy to tell if this is the case by re-challenging : ie stop, take the inhaler, then p 5 minutes try the exact same activity (intensity of workload) that just caused the symptoms and see if they are substantially diminished or not after saba 3) if there is an activity that reproducibly causes the symptoms, try the saba 15 min before the activity on alternate days   If in fact the saba really does help, then fine to continue to use it prn but advised may need to look closer at the maintenance regimen being used to achieve better control of airways disease with exertion.

## 2020-03-06 NOTE — Assessment & Plan Note (Signed)
Fluoro 2007:  Paralyzed right HD. -  03/05/2020   Walked RA  2 laps @ approx 275ft each @ slow  pace  stopped due to end of study, sats 96%, at end said both legs and breathing bothered him about the same but able to complete the walk s stopping.  He appears to be developing progressive decline in terms of mobility that is multifactorial but encourage him as much as possible to walk more          Each maintenance medication was reviewed in detail including emphasizing most importantly the difference between maintenance and prns and under what circumstances the prns are to be triggered using an action plan format where appropriate.  Total time for H and P, chart review, counseling with pt and daughter, directly observing portions of ambulatory 02 saturation study/ reviewing two different inhaler types with teach back  and generating customized AVS unique to this office visit with high level MDM/ charting = 40 min

## 2020-03-06 NOTE — Assessment & Plan Note (Signed)
Seen on 2015 CXR - CT chest 03/01/20  13 x 10 mm spiculated density is now seen in the right upper lobe in the area of previously noted ground-glass opacity.   CT results reviewed with pt /daughter >>> borderline for PET and risky for  bx, not suspicious enough for excisional bx in this 16 yowm with severe copd and geriactric decline  > really only option for now is follow at 3 months and if growing consider PET then and radiate if Pos s bx depending on findings on PET.

## 2020-03-07 NOTE — Telephone Encounter (Signed)
Discussed with daughter,  Work on breathing first and follow this up at next ov but very limited realistic options in terms of "early dx / treatment" given his general pulmoanry/ geriactric decline

## 2020-03-13 ENCOUNTER — Encounter (INDEPENDENT_AMBULATORY_CARE_PROVIDER_SITE_OTHER): Payer: Medicare Other | Admitting: Ophthalmology

## 2020-03-13 ENCOUNTER — Other Ambulatory Visit: Payer: Self-pay

## 2020-03-13 DIAGNOSIS — H43813 Vitreous degeneration, bilateral: Secondary | ICD-10-CM

## 2020-03-13 DIAGNOSIS — H33301 Unspecified retinal break, right eye: Secondary | ICD-10-CM | POA: Diagnosis not present

## 2020-03-13 DIAGNOSIS — H353221 Exudative age-related macular degeneration, left eye, with active choroidal neovascularization: Secondary | ICD-10-CM

## 2020-03-13 DIAGNOSIS — H353112 Nonexudative age-related macular degeneration, right eye, intermediate dry stage: Secondary | ICD-10-CM | POA: Diagnosis not present

## 2020-03-16 ENCOUNTER — Other Ambulatory Visit: Payer: Self-pay | Admitting: Cardiology

## 2020-04-09 NOTE — Progress Notes (Signed)
CARDIOLOGY OFFICE NOTE  Date:  04/23/2020    Tim [name] Date of Birth: Jul 02, 1929 Medical Record #[id number]  PCP:  Tim Bott, MD  Cardiologist:  Tim [name]   Chief Complaint  Patient presents with  . Follow-up    Seen for Dr. Marlou [name]    History of Present Illness: Tim [name] is a 86 y.o. male who presents today for a 6 month check.  Seen for Dr. Marlou [name].   He has a history of bilateral carotid artery disease, COPD, HLD with some statin intolerance & CPK elevation and intolerance to PCSK9 therapy, CAD with prior STEMI in 2001 with PCI to RCA, residual 100% circumflex, subsequent PCI for in-stent restenosis in June 2001, nuclear in 2014 with no scar, no ischemia 62%,with subsequent CABG x 4 by Dr. Servando [name] in 08/2017 with LIMA to LAD, SVG to DX, SVG to LCX and SVG to PD, and left localized pericardial effusion that has been chronic.  Former smoker - quit in 1980. Also with noted paralyzed right hemidiaphragm.   Last seen by Dr. Marlou [name] in August of 2019. He saw Tim [name] in August of 2020 - has had issues with anemia, chronic dyspnea but overall felt to be ok. I last saw him in October - he was felt to be doing well. Chronic phlegm. 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   Comes in today. Here with his grand daughter today. He is very hard of hearing. Unclear about his medicines. She tries to augment the history. He says he is feeling good. Denies chest pain. Breathing is always short - due to his COPD. Always with a little cough. He has been vaccinated. His CABG scar remains a little tender. She notes that about a month ago he noted some "pressure on his chest" - this has not recurred and difficult to discern. He feels like overall he is doing ok for his age and has no real concerns and that his lungs remain his primary concern.  He scraped his right forearm while helping someone put up a door.   Past Medical History:  Diagnosis Date  . AAA (abdominal  aortic aneurysm) (HCC)    3.2 cm 07/2016; 2-3 year f/u recommended  . Asthma   . BPH (benign prostatic hypertrophy)   . CAD (coronary artery disease)    9 STEMI 2001, PCI, RCA, residual 100% circumflex  /   PCI for in-stent restenosis June, 2001  /  nuclear February, 2010, no ischemia  . Cancer ([place])    skin cancer removed  . Carotid artery disease ([place])    Doppler, June, 2011, 0-39% bilateral, pt denies  . Cervical disc disease    Status post neurosurgery  . Colon polyps   . Ejection fraction    EF 55-65%, echo, 2009  . Elevated CPK    Chronic mild CPK elevation  . Emphysema    Mild  . Fatigue    Morning fatigue, August, 2011  . GERD (gastroesophageal reflux disease)   . Headache    back of head  . Hyperlipidemia   . Hypertension   . Low back pain    February, 2013  . Myocardial infarction ([place])   . Paralyzed hemidiaphragm    Right  . Paralyzed hemidiaphragm    Chronic  . Statin intolerance    Elevated CPK in the past    Past Surgical History:  Procedure Laterality Date  . BREAST SURGERY     right breast removed  .  CERVICAL DISC SURGERY    . CORONARY ARTERY BYPASS GRAFT N/A 08/24/2017   Procedure: CORONARY ARTERY BYPASS GRAFTING times four using left internal mammary artery and bilateral saphenous vein, using endoscope. TEE;  Surgeon: Tim Isaac, MD;  Location: [facility];  Service: Open Heart Surgery;  Laterality: N/A;  . LEFT HEART CATH AND CORONARY ANGIOGRAPHY N/A 07/29/2017   Procedure: LEFT HEART CATH AND CORONARY ANGIOGRAPHY;  Surgeon: Tim Bush, MD;  Location: [facility];  Service: Cardiovascular;  Laterality: N/A;  . SKIN CANCER EXCISION    . TEE WITHOUT CARDIOVERSION N/A 08/24/2017   Procedure: TRANSESOPHAGEAL ECHOCARDIOGRAM (TEE);  Surgeon: Tim Isaac, MD;  Location: [facility];  Service: Open Heart Surgery;  Laterality: N/A;     Medications: Current Meds  Medication Sig  . acetaminophen (TYLENOL) 500 MG tablet Take 500 mg by  mouth every 4 (four) hours as needed for mild pain.  [name] albuterol (PROVENTIL HFA;VENTOLIN HFA) 108 (90 BASE) MCG/ACT inhaler Inhale 2 puffs into the lungs every 6 (six) hours as needed for wheezing or shortness of breath.  [name] aspirin EC 81 MG tablet Take 81 mg by mouth daily.  [name] diltiazem (TIAZAC) 120 MG 24 hr capsule Take 1 capsule (120 mg total) by mouth daily.  [name] doxycycline (VIBRAMYCIN) 50 MG capsule Take 50 mg by mouth daily.   . finasteride (PROSCAR) 5 MG tablet Take 10 mg by mouth daily.   . Fluticasone-Umeclidin-Vilant (TRELEGY ELLIPTA) 100-62.5-25 MCG/INH AEPB Inhale 1 puff into the lungs daily.  . furosemide (LASIX) 40 MG tablet TAKE 1 TABLET (40 MG TOTAL) BY MOUTH DAILY AS NEEDED FOR FLUID.  [name] KLOR-CON M20 20 MEQ tablet TAKE 1 TABLET BY MOUTH EVERY DAY  . montelukast (SINGULAIR) 10 MG tablet Take 10 mg by mouth at bedtime.  [name] omeprazole (PRILOSEC) 40 MG capsule TAKE 1 CAPSULE BY MOUTH TWICE A DAY  . sodium chloride HYPERTONIC 3 % nebulizer solution Take 4 mLs by nebulization as needed for other or cough.  . tamsulosin (FLOMAX) 0.4 MG CAPS capsule Take 0.4 mg by mouth 2 (two) times daily. For urinary symptoms      Allergies: Allergies  Allergen Reactions  . Statins Other (See Comments)    CLASS EFFECT SEVERE LEG CRAMPS [MULTIPLE STATINS]  . Repatha [Evolocumab]     Flu like symptoms    Social History: The patient  reports that he quit smoking about 44 years ago. His smoking use included cigarettes. He has a 120.00 pack-year smoking history. He has never used smokeless tobacco. He reports that he does not drink alcohol and does not use drugs.   Family History: The patient's family history includes Cancer in his brother; Diabetes in his mother and sister; Kidney disease in his brother; Other in his father and sister.   Review of Systems: Please see the history of present illness.   All other systems are reviewed and negative.   Physical Exam: VS:  BP 138/64   Pulse (!) 104    Ht 5\' 10"  (1.778 m)   Wt 185 lb (83.9 kg)   SpO2 96%   BMI 26.54 kg/m  .  BMI Body mass index is 26.54 kg/m.  Wt Readings from Last 3 Encounters:  04/23/20 185 lb (83.9 kg)  03/05/20 181 lb 12.8 oz (82.5 kg)  10/18/19 187 lb (84.8 kg)    General: Elderly. Alert and in no acute distress. Hard of hearing.  Cardiac: Regular rate and rhythm. HR a little fast on my  exam - slower by the end of the visit. No edema.  Respiratory:  Lungs are clear to auscultation bilaterally with normal work of breathing.  GI: Soft and nontender.  MS: No deformity or atrophy. Gait and ROM intact.  Skin: Warm and dry. Color is normal.  Neuro:  Strength and sensation are intact and no gross focal deficits noted.  Psych: Alert, appropriate and with normal affect.   LABORATORY DATA:  EKG:  EKG was ordered today.  Personally reviewed by me - this demonstrates NSr - HR down to 93. Old anteroseptal MI noted.   Lab Results  Component Value Date   WBC 10.0 10/04/2018   HGB 9.9 (L) 10/04/2018   HCT 32.3 (L) 10/04/2018   PLT 305 10/04/2018   GLUCOSE 103 (H) 10/05/2018   CHOL 152 05/09/2018   TRIG 82 05/09/2018   HDL 38 (L) 05/09/2018   LDLCALC 98 05/09/2018   ALT 18 10/03/2018   AST 17 10/03/2018   NA 140 10/05/2018   K 4.0 10/05/2018   CL 108 10/05/2018   CREATININE 1.21 10/05/2018   BUN 24 (H) 10/05/2018   CO2 24 10/05/2018   TSH 2.942 08/31/2017   INR 1.39 08/24/2017   HGBA1C 6.4 (H) 08/20/2017     BNP (last 3 results) No results for input(s): BNP in the last 8760 hours.  ProBNP (last 3 results) No results for input(s): PROBNP in the last 8760 hours.   Other Studies Reviewed Today:  Carotid Doppler Summary 05/2019:  Right Carotid: Velocities in the right ICA are consistent with a 1-39%  stenosis.         The ECA appears >50% stenosed. The RICA velocities are  stable         compared to prior exam and are within normal limits.   Left Carotid: Velocities in the left  ICA are consistent with a 40-59%  stenosis.        The ECA appears >50% stenosed. The LICA velocities are  elevated        and have increased when compared to the prior exam.   Vertebrals: Bilateral vertebral arteries demonstrate antegrade flow.  Subclavians: Right subclavian artery was stenotic. Bilateral subclavian  artery        flow was disturbed. Mild velocities decrease noted in the  right        subclavian artery; moderate to significant decrease noted on  the        left.   *See table(s) above for measurements and observations.  Suggest follow up study in 12 months.    Electronically signed by Larae Grooms MD on 05/18/2019 at 8:13:39 AM.   TTE 11/11/17 Study Conclusions  - Left ventricle: The cavity size was normal. Wall thickness was normal. Systolic function was normal. The estimated ejection fraction was in the range of 60% to 65%. Doppler parameters are consistent with both elevated ventricular end-diastolic filling pressure and elevated left atrial filling pressure. - Left atrium: The atrium was mildly dilated. - Atrial septum: No defect or patent foramen ovale was identified. - Pericardium, extracardiac: Moderate localized posterior lateral pericardial effusion no tamponade    ASSESSMENT AND PLAN:  1.Tachycardia - HR has slowed - may be from deconditioning - recheck lab today. I think he is stable - would leave on his current regimen for now. Not in AF. Will continue with current dose of Cardizem.   2. CAD with prior CABG from 2018 - seems to be doing ok - would favor conservative management.  3. COPD - his most limiting factor - followed by pulmonary.   4. Iron deficient anemia - rechecking lab today.   5. History of PAF - in sinus by EKG today. HR slower by end of visit.   6. HLD - intolerant to statin/Repatha - would favor not rechecking.   7. Carotid disease - for updating later this year -  he is aware.   8. Chronic pericardial effusion - no symptoms noted.   Current medicines are reviewed with the patient today.  The patient does not have concerns regarding medicines other than what has been noted above.  The following changes have been made:  See above.  Labs/ tests ordered today include:    Orders Placed This Encounter  Procedures  . Basic metabolic panel  . CBC  . TSH  . EKG 12-Lead     Disposition:   FU with [place] in 6 months.   Patient is agreeable to this plan and will call if any problems develop in the interim.   SignedTruitt Merle, NP  04/23/2020 9:47 AM  [facility] [address] [place], [place]  [address] Phone: [phone] Fax: [phone]

## 2020-04-10 ENCOUNTER — Encounter (INDEPENDENT_AMBULATORY_CARE_PROVIDER_SITE_OTHER): Payer: Medicare Other | Admitting: Ophthalmology

## 2020-04-10 ENCOUNTER — Other Ambulatory Visit: Payer: Self-pay

## 2020-04-10 ENCOUNTER — Telehealth: Payer: Self-pay | Admitting: Internal Medicine

## 2020-04-10 DIAGNOSIS — H353221 Exudative age-related macular degeneration, left eye, with active choroidal neovascularization: Secondary | ICD-10-CM

## 2020-04-10 DIAGNOSIS — H33301 Unspecified retinal break, right eye: Secondary | ICD-10-CM

## 2020-04-10 DIAGNOSIS — H43813 Vitreous degeneration, bilateral: Secondary | ICD-10-CM | POA: Diagnosis not present

## 2020-04-10 DIAGNOSIS — H353112 Nonexudative age-related macular degeneration, right eye, intermediate dry stage: Secondary | ICD-10-CM | POA: Diagnosis not present

## 2020-04-10 MED ORDER — TRELEGY ELLIPTA 100-62.5-25 MCG/INH IN AEPB: 1.0000 | INHALATION_SPRAY | Freq: Every day | RESPIRATORY_TRACT | 5 refills | Status: DC

## 2020-04-10 NOTE — Telephone Encounter (Signed)
Pts daughter [name] was returning call. Her dad is a Dr. Melvyn Novas patient. Cb#: [phone]

## 2020-04-10 NOTE — Telephone Encounter (Signed)
Pt's daughter returning call.  [phone]

## 2020-04-10 NOTE — Telephone Encounter (Signed)
Lm x2 for pt's daughter, [name] [facility]).

## 2020-04-10 NOTE — Telephone Encounter (Signed)
Lm for pt's daughter, Linda(DPR).

## 2020-04-10 NOTE — Telephone Encounter (Signed)
Called and spoke with pt's daughter [name] and verified pt's preferred pharmacy. Rx for Trelegy has been sent to pharmacy for pt. Nothing further needed.

## 2020-04-23 ENCOUNTER — Ambulatory Visit (INDEPENDENT_AMBULATORY_CARE_PROVIDER_SITE_OTHER): Payer: Medicare Other | Admitting: Nurse Practitioner

## 2020-04-23 ENCOUNTER — Encounter: Payer: Self-pay | Admitting: Nurse Practitioner

## 2020-04-23 ENCOUNTER — Other Ambulatory Visit: Payer: Self-pay

## 2020-04-23 VITALS — BP 138/64 | HR 104 | Ht 70.0 in | Wt 185.0 lb

## 2020-04-23 DIAGNOSIS — D509 Iron deficiency anemia, unspecified: Secondary | ICD-10-CM

## 2020-04-23 DIAGNOSIS — E782 Mixed hyperlipidemia: Secondary | ICD-10-CM | POA: Diagnosis not present

## 2020-04-23 DIAGNOSIS — I2510 Atherosclerotic heart disease of native coronary artery without angina pectoris: Secondary | ICD-10-CM

## 2020-04-23 DIAGNOSIS — I10 Essential (primary) hypertension: Secondary | ICD-10-CM

## 2020-04-23 DIAGNOSIS — I480 Paroxysmal atrial fibrillation: Secondary | ICD-10-CM | POA: Diagnosis not present

## 2020-04-23 LAB — BASIC METABOLIC PANEL
BUN/Creatinine Ratio: 22 (ref 10–24)
BUN: 25 mg/dL (ref 8–27)
CO2: 23 mmol/L (ref 20–29)
Calcium: 8.8 mg/dL (ref 8.6–10.2)
Chloride: 106 mmol/L (ref 96–106)
Creatinine, Ser: 1.15 mg/dL (ref 0.76–1.27)
GFR calc Af Amer: 65 mL/min/{1.73_m2} (ref >59)
GFR calc non Af Amer: 56 mL/min/{1.73_m2} — ABNORMAL LOW (ref >59)
Glucose: 115 mg/dL — ABNORMAL HIGH (ref 65–99)
Potassium: 4.8 mmol/L (ref 3.5–5.2)
Sodium: 142 mmol/L (ref 134–144)

## 2020-04-23 LAB — TSH: TSH: 2.51 u[IU]/mL (ref 0.450–4.500)

## 2020-04-23 LAB — CBC
Hematocrit: 31.3 % — ABNORMAL LOW (ref 37.5–51.0)
Hemoglobin: 9.8 g/dL — ABNORMAL LOW (ref 13.0–17.7)
MCH: 24.8 pg — ABNORMAL LOW (ref 26.6–33.0)
MCHC: 31.3 g/dL — ABNORMAL LOW (ref 31.5–35.7)
MCV: 79 fL (ref 79–97)
Platelets: 325 10*3/uL (ref 150–450)
RBC: 3.95 x10E6/uL — ABNORMAL LOW (ref 4.14–5.80)
RDW: 16.7 % — ABNORMAL HIGH (ref 11.6–15.4)
WBC: 7 10*3/uL (ref 3.4–10.8)

## 2020-04-23 NOTE — Patient Instructions (Addendum)
After Visit Summary:  We will be checking the following labs today - BMET, CBC, and TSH   Medication Instructions:    Continue with your current medicines.    If you need a refill on your cardiac medications before your next appointment, please call your pharmacy.     Testing/Procedures To Be Arranged:  N/A  Follow-Up:   See me in about 6 months.     At [facility], you and your health needs are our priority.  As part of our continuing mission to provide you with exceptional heart care, we have created designated Provider Care Teams.  These Care Teams include your primary Cardiologist (physician) and Advanced Practice Providers (APPs -  Physician Assistants and Nurse Practitioners) who all work together to provide you with the care you need, when you need it.  Special Instructions:  . Stay safe, wash your hands for at least 20 seconds and wear a mask when needed.  . It was good to talk with you today.  . Look for "tegaderm" at the pharmacy to put on his arm   Call the [place] office at [phone] if you have any questions, problems or concerns.

## 2020-04-26 ENCOUNTER — Telehealth: Payer: Self-pay | Admitting: Cardiology

## 2020-04-26 NOTE — Telephone Encounter (Signed)
Patient's daughter is returning call.

## 2020-04-26 NOTE — Telephone Encounter (Signed)
I spoke with patient's daughter and reviewed recent lab results with her. Lab results routed to patient's PCP

## 2020-05-01 ENCOUNTER — Ambulatory Visit (INDEPENDENT_AMBULATORY_CARE_PROVIDER_SITE_OTHER): Payer: Medicare Other | Admitting: Internal Medicine

## 2020-05-01 ENCOUNTER — Encounter: Payer: Self-pay | Admitting: Internal Medicine

## 2020-05-01 ENCOUNTER — Other Ambulatory Visit: Payer: Self-pay | Admitting: Internal Medicine

## 2020-05-01 ENCOUNTER — Other Ambulatory Visit: Payer: Self-pay

## 2020-05-01 DIAGNOSIS — J449 Chronic obstructive pulmonary disease, unspecified: Secondary | ICD-10-CM

## 2020-05-01 DIAGNOSIS — R911 Solitary pulmonary nodule: Secondary | ICD-10-CM | POA: Diagnosis not present

## 2020-05-01 LAB — PULMONARY FUNCTION TEST
DL/VA % pred: 90 %
DL/VA: 3.39 ml/min/mmHg/L
DLCO cor % pred: 65 %
DLCO cor: 15.67 ml/min/mmHg
DLCO unc % pred: 54 %
DLCO unc: 13.04 ml/min/mmHg
FEF 25-75 Post: 1.9 L/sec
FEF 25-75 Pre: 1.05 L/sec
FEF2575-%Change-Post: 81 %
FEF2575-%Pred-Post: 117 %
FEF2575-%Pred-Pre: 64 %
FEV1-%Change-Post: 12 %
FEV1-%Pred-Post: 86 %
FEV1-%Pred-Pre: 76 %
FEV1-Post: 2.25 L
FEV1-Pre: 2.01 L
FEV1FVC-%Change-Post: 9 %
FEV1FVC-%Pred-Pre: 99 %
FEV6-%Change-Post: 5 %
FEV6-%Pred-Post: 85 %
FEV6-%Pred-Pre: 80 %
FEV6-Post: 2.99 L
FEV6-Pre: 2.83 L
FEV6FVC-%Change-Post: 2 %
FEV6FVC-%Pred-Post: 107 %
FEV6FVC-%Pred-Pre: 105 %
FVC-%Change-Post: 2 %
FVC-%Pred-Post: 78 %
FVC-%Pred-Pre: 76 %
FVC-Post: 2.99 L
FVC-Pre: 2.91 L
Post FEV1/FVC ratio: 75 %
Post FEV6/FVC ratio: 100 %
Pre FEV1/FVC ratio: 69 %
Pre FEV6/FVC Ratio: 97 %
RV % pred: 94 %
RV: 2.74 L
TLC % pred: 81 %
TLC: 5.94 L

## 2020-05-01 NOTE — Assessment & Plan Note (Signed)
Seen on 2015 CXR - CT chest 03/01/20  13 x 10 mm spiculated density is now seen in the right upper lobe in the area of previously noted ground-glass opacity. - repeat CT 08/31/20 placed in reminder file   At age 86 with such a small lesion even if proves to be maligancy the natural hx of a tumor vs geriactric decline should be taken into account and he is not inclined to do early intervention anyway  Discussed in detail all the  indications, usual  risks and alternatives  relative to the benefits with patient who agrees to proceed with conservative f/u as outlined  But happy to entertain second opinion from T surgery if they desire to seak it and will discuss this with Truitt Merle at next f/u ov.     Medical decision making was a moderate level of complexity in this case because of  two chronic conditions /diagnoses requiring extra time for  H and P, chart review, counseling   and generating customized AVS unique to this office visit and charting.   Each maintenance medication was reviewed in detail including emphasizing most importantly the difference between maintenance and prns and under what circumstances the prns are to be triggered using an action plan format where appropriate. Please see avs for details which were reviewed in writing by both me and my nurse and patient given a written copy highlighted where appropriate with yellow highlighter for the patient's continued care at home along with an updated version of their medications.  Patient was asked to maintain medication reconciliation by comparing this list to the actual medications being used at home and to contact this office right away if there is a conflict or discrepancy.

## 2020-05-01 NOTE — Progress Notes (Signed)
Subjective:    Patient ID: Tim [name], male    DOB: May 22, 1929  MRN: [id number]  Brief patient profile:  15 ywom  Former patient of Dr. Gwenette Greet quit smoking 1977  COPD and a paralyzed R Hemidiaphragm as seen on fluoroscopy in 2007. Lung function testing in 2005 showed  airflow obstruction, FEV1 of 1.53 L (43% predicted), total lung capacity 62% predicted, DLCO 73% predicted. 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   Had CABG by Dr. Servando Snare in 1856 complicated by a pericardial effusion.  Found to have evidence of pharygneal phase dysphagia and aspiraiton on modified barium swallow in 04/2018.       08/28/2019  f/u ov/[name] re:  New pt evaluation Chief Complaint  Patient presents with  . Follow-up    Pt states he has been doing good since last visit. States he still wheezes occ and coughs but it is no different than before.  Dyspnea:  Limited more arthritis / 100 ft to shop has to stop  Cough: min esp in am / clear mucus  Sleeping: 45 degrees in recliner most nights due to sob  = baseline  SABA use: several times a week but thoroughly confused with meds/ daughter helps but granddaughter brought him taking multiple forms of laba/ics  02: none Chronic L CP since ? cabg no pleuritic or ex and along incision line = stings  rec You will need to call us the names of the medications you are taking regularly versus the ones you just take as needed when needed ( Albuterol = Proair) . Nebulized formoterol and budesonide   =   BREO Only use your albuterol (proair) as a rescue medication  Please schedule a follow up office visit in 6 months, call sooner if needed with all medications /inhalers/ solutions in hand so we can verify exactly what you are taking. This includes all medications from all doctors and over the [place] separate them into two bags:  the ones you take automatically, no matter what, vs the ones you take just when you feel you need  them "BAG #2 is UP TO YOU"  - this will really help [place] help you take your medications more effectively.     03/05/2020  f/u ov/[name] re:  GOLD III - had both shots/ no meds Chief Complaint  Patient presents with  . Follow-up    Coughing up mucus mostly at night.SOB with activity.  Dyspnea: now driving car to shed " my back and legs give out about the same time as my breathing @ > 100 ft slow / flat = MMRC3 = can't walk 100 yards even at a slow pace at a flat grade s stopping due to sob   Cough: feels  Some "congestion" minimal mucoid at hs mostly but does not typically wake him Sleeping: sleeps 45 degrees in recliner  SABA use: alb neb bid not prn,   02: no rec Plan A = Automatic = Always=    Stop Breo and start Trelegy one click first thing each am then rinse and gargle      Plan B = Backup (to supplement plan A, not to replace it) Only use your albuterol inhaler as a rescue medication   Plan C = Crisis (instead of Plan B but only if Plan B stops working) - only use your levoalbuterol nebulizer if you first try Plan B  Prilosec (Omeprazole ) 40 mg  Take 30- 60  min before your first and last meals of the day  GERD diet      05/01/2020  f/u ov/[name] re: GOLD III copd / maint on trelegy and ppi pc "works betterTour manager Complaint  Patient presents with  . Follow-up    SOB and fatigued with activity  Dyspnea:  Limited by knees > sob  Cough: prod white mucus  esp at hs  Sleeping: mostly doesn't wake him up / some in am  SABA use: 2 puffs  Hs  02: none    No obvious day to day or daytime variability or assoc excess/ purulent sputum or mucus plugs or hemoptysis or cp or chest tightness, subjective wheeze or overt sinus or hb symptoms.    Also denies any obvious fluctuation of symptoms with weather or environmental changes or other aggravating or alleviating factors except as outlined above   No unusual exposure hx or h/o childhood pna/ asthma or knowledge of premature birth.  Current  Allergies, Complete Past Medical History, Past Surgical History, Family History, and Social History were reviewed in [employer] record.  ROS  The following are not active complaints unless bolded Hoarseness, sore throat, dysphagia, dental problems, itching, sneezing,  nasal congestion or discharge of excess mucus or purulent secretions, ear ache,   fever, chills, sweats, unintended wt loss or wt gain, classically pleuritic or exertional cp,  orthopnea pnd or arm/hand swelling  or leg swelling, presyncope, palpitations, abdominal pain, anorexia, nausea, vomiting, diarrhea  or change in bowel habits or change in bladder habits, change in stools or change in urine, dysuria, hematuria,  rash, arthralgias, visual complaints, headache, numbness, weakness or ataxia or problems with walking or coordination,  change in mood or  memory.        Current Meds  Medication Sig  . acetaminophen (TYLENOL) 500 MG tablet Take 500 mg by mouth every 4 (four) hours as needed for mild pain.  [name] albuterol (PROVENTIL HFA;VENTOLIN HFA) 108 (90 BASE) MCG/ACT inhaler Inhale 2 puffs into the lungs every 6 (six) hours as needed for wheezing or shortness of breath.  [name] aspirin EC 81 MG tablet Take 81 mg by mouth daily.  [name] diltiazem (TIAZAC) 120 MG 24 hr capsule Take 1 capsule (120 mg total) by mouth daily.  [name] doxycycline (VIBRAMYCIN) 50 MG capsule Take 50 mg by mouth daily.   . finasteride (PROSCAR) 5 MG tablet Take 10 mg by mouth daily.   . Fluticasone-Umeclidin-Vilant (TRELEGY ELLIPTA) 100-62.5-25 MCG/INH AEPB Inhale 1 puff into the lungs daily.  . furosemide (LASIX) 40 MG tablet TAKE 1 TABLET (40 MG TOTAL) BY MOUTH DAILY AS NEEDED FOR FLUID.  [name] KLOR-CON M20 20 MEQ tablet TAKE 1 TABLET BY MOUTH EVERY DAY  . montelukast (SINGULAIR) 10 MG tablet Take 10 mg by mouth at bedtime.  [name] omeprazole (PRILOSEC) 40 MG capsule TAKE 1 CAPSULE BY MOUTH TWICE A DAY  . sodium chloride HYPERTONIC 3 % nebulizer solution  Take 4 mLs by nebulization as needed for other or cough.  . tamsulosin (FLOMAX) 0.4 MG CAPS capsule Take 0.4 mg by mouth 2 (two) times daily. For urinary symptoms           Past Medical History:  Diagnosis Date  . AAA (abdominal aortic aneurysm) (HCC)    3.2 cm 07/2016; 2-3 year f/u recommended  . Asthma   . BPH (benign prostatic hypertrophy)   . CAD (coronary artery disease)    9 STEMI 2001, PCI, RCA, residual 100% circumflex  /  PCI for in-stent restenosis June, 2001  /  nuclear February, 2010, no ischemia  . Cancer ([place])    skin cancer removed  . Carotid artery disease ([place])    Doppler, June, 2011, 0-39% bilateral, pt denies  . Cervical disc disease    Status post neurosurgery  . Colon polyps   . Ejection fraction    EF 55-65%, echo, 2009  . Elevated CPK    Chronic mild CPK elevation  . Emphysema    Mild  . Fatigue    Morning fatigue, August, 2011  . GERD (gastroesophageal reflux disease)   . Headache    back of head  . Hyperlipidemia   . Hypertension   . Low back pain    February, 2013  . Myocardial infarction ([place])   . Paralyzed hemidiaphragm    Right  . Paralyzed hemidiaphragm    Chronic  . Statin intolerance    Elevated CPK in the past         Objective:     05/01/2020       186 03/05/2020       181   08/28/19 180 lb 12.8 oz (82 kg)  05/24/19 180 lb (81.6 kg)  05/11/19 176 lb 12.8 oz (80.2 kg)     amb pleasant wm / slt gargling cough    Vital signs reviewed  05/01/2020  - Note at rest 02 sats  96 % on RA        Reports edentulous   HEENT : pt wearing mask not removed for exam due to covid -19 concerns.    NECK :  without JVD/Nodes/TM/ nl carotid upstrokes bilaterally   LUNGS: no acc muscle use,  Mod barrel  contour chest wall with bilateral  Distant bs s audible wheeze and  without cough on insp or exp maneuvers and mod  Hyperresonant  to  percussion bilaterally     CV:  RRR  no s3 or murmur or increase in P2, and no edema   ABD:   soft and nontender with pos mid insp Hoover's  in the supine position. No bruits or organomegaly appreciated, bowel sounds nl  MS:     ext warm without deformities, calf tenderness, cyanosis or clubbing No obvious joint restrictions   SKIN: warm and dry without lesions    NEURO:  alert, approp, nl sensorium with  no motor or cerebellar deficits apparent.          Labs: 08/2017 IgE normal January 2019 IgE normal  Exhaled nitric oxide test: January 2018 22 ppm    Orther imaging: 04/2018 Barium swallow and modified barium swallow showed gross aspiration, therapy and instruction offered.            Assessment & Plan:

## 2020-05-01 NOTE — Assessment & Plan Note (Signed)
Quit smoking 1977 Fluoro 2007: paralyzed right HD PFT's 05/02/2004:  FEV1 1.53 (43%), ratio 60, TLC 62%, DLCO 73% - 03/05/2020  After extensive coaching inhaler device,  effectiveness =    75% with hfa/ 90% with elipta > try trelegy PFT's  05/01/2020  FEV1 2.25 (86 % ) ratio 0.75  p 12 % improvement from saba p trelegy  prior to study with DLCO  13.04 (54%) corrects to 3.39 (90%)  for alv volume and FV curve slt concave  curvature better p  saba    Group D in terms of symptom/risk and laba/lama/ICS  therefore appropriate rx at this point >>>  Continue trelegy and prn saba.   I spent extra time with pt today reviewing appropriate use of albuterol for prn use on exertion with the following points: 1) saba is for relief of sob that does not improve by walking a slower pace or resting but rather if the pt does not improve after trying this first. 2) If the pt is convinced, as many are, that saba helps recover from activity faster then it's easy to tell if this is the case by re-challenging : ie stop, take the inhaler, then p 5 minutes try the exact same activity (intensity of workload) that just caused the symptoms and see if they are substantially diminished or not after saba 3) if there is an activity that reproducibly causes the symptoms, try the saba 15 min before the activity on alternate days   If in fact the saba really does help, then fine to continue to use it prn but advised may need to look closer at the maintenance regimen being used to achieve better control of airways disease with exertion.

## 2020-05-01 NOTE — Progress Notes (Signed)
PFT done today. 

## 2020-05-01 NOTE — Patient Instructions (Addendum)
We call to be sure you have a scan set up around 08/31/20 and I will call you with results to decide on next step  No change in medications  No pulmonary follow up needed thru the office for now

## 2020-05-08 ENCOUNTER — Encounter (INDEPENDENT_AMBULATORY_CARE_PROVIDER_SITE_OTHER): Payer: Medicare Other | Admitting: Ophthalmology

## 2020-05-08 ENCOUNTER — Other Ambulatory Visit: Payer: Self-pay

## 2020-05-08 DIAGNOSIS — H353221 Exudative age-related macular degeneration, left eye, with active choroidal neovascularization: Secondary | ICD-10-CM | POA: Diagnosis not present

## 2020-05-08 DIAGNOSIS — H33301 Unspecified retinal break, right eye: Secondary | ICD-10-CM

## 2020-05-08 DIAGNOSIS — H43813 Vitreous degeneration, bilateral: Secondary | ICD-10-CM

## 2020-05-08 DIAGNOSIS — H353112 Nonexudative age-related macular degeneration, right eye, intermediate dry stage: Secondary | ICD-10-CM

## 2020-05-22 ENCOUNTER — Other Ambulatory Visit: Payer: Self-pay

## 2020-05-22 ENCOUNTER — Ambulatory Visit (HOSPITAL_COMMUNITY)
Admission: RE | Admit: 2020-05-22 | Discharge: 2020-05-22 | Disposition: A | Discharge status: Home / Self Care | Payer: Medicare Other | Source: Ambulatory Visit | Attending: Cardiovascular Disease | Admitting: Cardiovascular Disease

## 2020-05-22 ENCOUNTER — Other Ambulatory Visit (HOSPITAL_COMMUNITY): Payer: Self-pay | Admitting: Cardiology

## 2020-05-22 DIAGNOSIS — I6523 Occlusion and stenosis of bilateral carotid arteries: Secondary | ICD-10-CM

## 2020-05-23 ENCOUNTER — Telehealth: Payer: Self-pay | Admitting: *Deleted

## 2020-05-23 DIAGNOSIS — I6523 Occlusion and stenosis of bilateral carotid arteries: Secondary | ICD-10-CM

## 2020-05-23 NOTE — Telephone Encounter (Signed)
-----   Message from Isaiah Serge, NP sent at 05/22/2020 10:05 PM EDT ----- Carotid disease Is stable.  Edmonia Lynch in one year

## 2020-05-29 ENCOUNTER — Other Ambulatory Visit: Payer: Self-pay | Admitting: Cardiology

## 2020-06-05 ENCOUNTER — Encounter (INDEPENDENT_AMBULATORY_CARE_PROVIDER_SITE_OTHER): Payer: Medicare Other | Admitting: Ophthalmology

## 2020-06-12 ENCOUNTER — Other Ambulatory Visit: Payer: Self-pay

## 2020-06-12 ENCOUNTER — Encounter (INDEPENDENT_AMBULATORY_CARE_PROVIDER_SITE_OTHER): Payer: Medicare Other | Admitting: Ophthalmology

## 2020-06-12 DIAGNOSIS — H353221 Exudative age-related macular degeneration, left eye, with active choroidal neovascularization: Secondary | ICD-10-CM

## 2020-06-12 DIAGNOSIS — H353112 Nonexudative age-related macular degeneration, right eye, intermediate dry stage: Secondary | ICD-10-CM | POA: Diagnosis not present

## 2020-06-12 DIAGNOSIS — H33301 Unspecified retinal break, right eye: Secondary | ICD-10-CM

## 2020-06-12 DIAGNOSIS — H43813 Vitreous degeneration, bilateral: Secondary | ICD-10-CM

## 2020-07-10 ENCOUNTER — Encounter (INDEPENDENT_AMBULATORY_CARE_PROVIDER_SITE_OTHER): Payer: Medicare Other | Admitting: Ophthalmology

## 2020-07-10 ENCOUNTER — Other Ambulatory Visit: Payer: Self-pay

## 2020-07-10 DIAGNOSIS — H353221 Exudative age-related macular degeneration, left eye, with active choroidal neovascularization: Secondary | ICD-10-CM

## 2020-07-10 DIAGNOSIS — H43813 Vitreous degeneration, bilateral: Secondary | ICD-10-CM

## 2020-07-10 DIAGNOSIS — H33301 Unspecified retinal break, right eye: Secondary | ICD-10-CM | POA: Diagnosis not present

## 2020-07-10 DIAGNOSIS — H353112 Nonexudative age-related macular degeneration, right eye, intermediate dry stage: Secondary | ICD-10-CM

## 2020-08-14 ENCOUNTER — Other Ambulatory Visit: Payer: Self-pay

## 2020-08-14 ENCOUNTER — Encounter (INDEPENDENT_AMBULATORY_CARE_PROVIDER_SITE_OTHER): Payer: Medicare Other | Admitting: Ophthalmology

## 2020-08-14 DIAGNOSIS — H353221 Exudative age-related macular degeneration, left eye, with active choroidal neovascularization: Secondary | ICD-10-CM | POA: Diagnosis not present

## 2020-08-14 DIAGNOSIS — H33301 Unspecified retinal break, right eye: Secondary | ICD-10-CM

## 2020-08-14 DIAGNOSIS — H43813 Vitreous degeneration, bilateral: Secondary | ICD-10-CM

## 2020-08-14 DIAGNOSIS — H353112 Nonexudative age-related macular degeneration, right eye, intermediate dry stage: Secondary | ICD-10-CM | POA: Diagnosis not present

## 2020-08-15 ENCOUNTER — Other Ambulatory Visit: Payer: Self-pay | Admitting: Internal Medicine

## 2020-08-15 DIAGNOSIS — R911 Solitary pulmonary nodule: Secondary | ICD-10-CM

## 2020-08-16 ENCOUNTER — Other Ambulatory Visit: Payer: Self-pay | Admitting: Primary Care

## 2020-09-02 ENCOUNTER — Ambulatory Visit (INDEPENDENT_AMBULATORY_CARE_PROVIDER_SITE_OTHER)
Admission: RE | Admit: 2020-09-02 | Discharge: 2020-09-02 | Disposition: A | Discharge status: Home / Self Care | Payer: Medicare Other | Source: Ambulatory Visit | Attending: Internal Medicine | Admitting: Internal Medicine

## 2020-09-02 ENCOUNTER — Encounter: Payer: Self-pay | Admitting: Internal Medicine

## 2020-09-02 ENCOUNTER — Other Ambulatory Visit: Payer: Self-pay

## 2020-09-02 DIAGNOSIS — R911 Solitary pulmonary nodule: Secondary | ICD-10-CM

## 2020-09-04 ENCOUNTER — Other Ambulatory Visit: Payer: Self-pay | Admitting: Internal Medicine

## 2020-09-04 DIAGNOSIS — R918 Other nonspecific abnormal finding of lung field: Secondary | ICD-10-CM

## 2020-09-04 NOTE — Progress Notes (Signed)
Spoke with pt's daughter okay per DPR and notified of results per Dr. Melvyn Novas. She verbalized understanding and denied any questions. Will inform the pt. I ordered repeat CT.

## 2020-09-18 ENCOUNTER — Other Ambulatory Visit: Payer: Self-pay

## 2020-09-18 ENCOUNTER — Encounter (INDEPENDENT_AMBULATORY_CARE_PROVIDER_SITE_OTHER): Payer: Medicare Other | Admitting: Ophthalmology

## 2020-09-18 DIAGNOSIS — H353112 Nonexudative age-related macular degeneration, right eye, intermediate dry stage: Secondary | ICD-10-CM

## 2020-09-18 DIAGNOSIS — H43813 Vitreous degeneration, bilateral: Secondary | ICD-10-CM

## 2020-09-18 DIAGNOSIS — H33301 Unspecified retinal break, right eye: Secondary | ICD-10-CM | POA: Diagnosis not present

## 2020-09-18 DIAGNOSIS — I10 Essential (primary) hypertension: Secondary | ICD-10-CM

## 2020-09-18 DIAGNOSIS — H353221 Exudative age-related macular degeneration, left eye, with active choroidal neovascularization: Secondary | ICD-10-CM

## 2020-10-07 ENCOUNTER — Other Ambulatory Visit: Payer: Self-pay | Admitting: Cardiology

## 2020-10-08 NOTE — Progress Notes (Signed)
CARDIOLOGY OFFICE NOTE  Date:  10/22/2020    Tim [name] Date of Birth: 06/15/1929 Medical Record [id number]  PCP:  Tim Bott, MD  Cardiologist:  Tim [name]   Chief Complaint  Patient presents with  . Follow-up    Seen for Tim [name]    History of Present Illness: Tim [name] is a 87 y.o. male who presents today for a follow up visit. Seen for Tim [name].    He has a history of bilateral carotid artery disease, COPD, HLD with some statin intolerance & CPK elevation and intolerance to PCSK9 therapy, CAD with prior STEMI in 2001 with PCI to RCA, residual 100% circumflex, subsequent PCI for in-stent restenosis in June 2001, nuclear in 2014 with no scar, no ischemia 62%, with subsequent CABG x 4 by Tim [name] in 08/2017 with LIMA to LAD, SVG to DX, SVG to LCX and SVG to PD, and left localized pericardial effusion that has been chronic.  Former smoker - quit in 1980. Also with noted paralyzed right hemidiaphragm.    Last seen by Tim [name] in August of 2019. He saw Tim [name] in August of 2020 - has had issues with anemia, chronic dyspnea but overall felt to be ok. I last saw him in August of 2021 - chronic phlegm. 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 Very hard of hearing.    Comes in today. Here with his grand daughter Tim [name] - who augments the history. No worrisome chest pain. His breathing is short at times - this is unchanged. Rare dizziness. Continues to work on his birdhouses. Feet are cold at night. No recent labs. Family feels like he is doing ok. Turning 89 in about 2 weeks. Some rare awareness of elevated HR. He does not typically use salt.   Past Medical History:  Diagnosis Date  . AAA (abdominal aortic aneurysm) (HCC)    3.2 cm 07/2016; 2-3 year f/u recommended  . Asthma   . BPH (benign prostatic hypertrophy)   . CAD (coronary artery disease)    9 STEMI 2001, PCI, RCA, residual 100% circumflex  /   PCI for in-stent restenosis June, 2001  /   nuclear February, 2010, no ischemia  . Cancer ([place])    skin cancer removed  . Carotid artery disease ([place])    Doppler, June, 2011, 0-39% bilateral, pt denies  . Cervical disc disease    Status post neurosurgery  . Colon polyps   . Ejection fraction    EF 55-65%, echo, 2009  . Elevated CPK    Chronic mild CPK elevation  . Emphysema    Mild  . Fatigue    Morning fatigue, August, 2011  . GERD (gastroesophageal reflux disease)   . Headache    back of head  . Hyperlipidemia   . Hypertension   . Low back pain    February, 2013  . Myocardial infarction ([place])   . Paralyzed hemidiaphragm    Right  . Paralyzed hemidiaphragm    Chronic  . Statin intolerance    Elevated CPK in the past    Past Surgical History:  Procedure Laterality Date  . BREAST SURGERY     right breast removed  . CERVICAL DISC SURGERY    . CORONARY ARTERY BYPASS GRAFT N/A 08/24/2017   Procedure: CORONARY ARTERY BYPASS GRAFTING times four using left internal mammary artery and bilateral saphenous vein, using endoscope. TEE;  Surgeon: Tim Isaac, MD;  Location: [facility];  Service: Open Heart Surgery;  Laterality: N/A;  . LEFT HEART CATH AND CORONARY ANGIOGRAPHY N/A 07/29/2017   Procedure: LEFT HEART CATH AND CORONARY ANGIOGRAPHY;  Surgeon: Tim Bush, MD;  Location: [facility];  Service: Cardiovascular;  Laterality: N/A;  . SKIN CANCER EXCISION    . TEE WITHOUT CARDIOVERSION N/A 08/24/2017   Procedure: TRANSESOPHAGEAL ECHOCARDIOGRAM (TEE);  Surgeon: Tim Isaac, MD;  Location: [facility];  Service: Open Heart Surgery;  Laterality: N/A;     Medications: Current Meds  Medication Sig  . acetaminophen (TYLENOL) 500 MG tablet Take 500 mg by mouth every 4 (four) hours as needed for mild pain.  [name] albuterol (PROVENTIL HFA;VENTOLIN HFA) 108 (90 BASE) MCG/ACT inhaler Inhale 2 puffs into the lungs every 6 (six) hours as needed for wheezing or shortness of breath.  [name] aspirin EC 81 MG tablet Take 81  mg by mouth daily.  [name] BESIVANCE 0.6 % SUSP Apply to eye every 30 (thirty) days. Drops in left eye  . diltiazem (CARDIZEM CD) 180 MG 24 hr capsule Take 1 capsule (180 mg total) by mouth daily.  [name] doxycycline (VIBRAMYCIN) 50 MG capsule Take 50 mg by mouth daily.   . finasteride (PROSCAR) 5 MG tablet Take 10 mg by mouth daily.   . Fluticasone-Umeclidin-Vilant (TRELEGY ELLIPTA) 100-62.5-25 MCG/INH AEPB Inhale 1 puff into the lungs daily.  . furosemide (LASIX) 40 MG tablet TAKE 1 TABLET (40 MG TOTAL) BY MOUTH DAILY AS NEEDED FOR FLUID.  [name] KLOR-CON M20 20 MEQ tablet TAKE 1 TABLET BY MOUTH EVERY DAY  . montelukast (SINGULAIR) 10 MG tablet TAKE 1 TABLET BY MOUTH EVERYDAY AT BEDTIME  . omeprazole (PRILOSEC) 40 MG capsule TAKE 1 CAPSULE BY MOUTH TWICE A DAY  . sodium chloride HYPERTONIC 3 % nebulizer solution Take 4 mLs by nebulization as needed for other or cough.  . tamsulosin (FLOMAX) 0.4 MG CAPS capsule Take 0.4 mg by mouth 2 (two) times daily. For urinary symptoms  . [DISCONTINUED] TIADYLT ER 120 MG 24 hr capsule TAKE 1 CAPSULE BY MOUTH EVERY DAY     Allergies: Allergies  Allergen Reactions  . Statins Other (See Comments)    CLASS EFFECT SEVERE LEG CRAMPS [MULTIPLE STATINS]  . Repatha [Evolocumab]     Flu like symptoms    Social History: The patient  reports that he quit smoking about 45 years ago. His smoking use included cigarettes. He has a 120.00 pack-year smoking history. He has never used smokeless tobacco. He reports that he does not drink alcohol and does not use drugs.   Family History: The patient's family history includes Cancer in his brother; Diabetes in his mother and sister; Kidney disease in his brother; Other in his father and sister.   Review of Systems: Please see the history of present illness.   All other systems are reviewed and negative.   Physical Exam: VS:  BP 118/70   Pulse (!) 106   Ht 5\' 11"  (1.803 m)   Wt 182 lb (82.6 kg)   SpO2 100%   BMI 25.38 kg/m   .  BMI Body mass index is 25.38 kg/m.  Wt Readings from Last 3 Encounters:  10/22/20 182 lb (82.6 kg)  05/01/20 186 lb (84.4 kg)  04/23/20 185 lb (83.9 kg)    General: Pleasant. Alert and in no acute distress. He is hard of hearing. Weight is down a few pounds.  Cardiac: Regular rate and rhythm. Rate is a little fast on exam - about 110.  No edema.  Respiratory:  Lungs are clear to auscultation bilaterally with normal work of breathing.  GI: Soft and nontender.  MS: No deformity or atrophy. Gait and ROM intact. Using a cane.  Skin: Warm and dry. Color is normal.  Neuro:  Strength and sensation are intact and no gross focal deficits noted.  Psych: Alert, appropriate and with normal affect.   LABORATORY DATA:  EKG:  EKG is ordered today.  Personally reviewed by me. This demonstrates .  Lab Results  Component Value Date   WBC 7.0 04/23/2020   HGB 9.8 (L) 04/23/2020   HCT 31.3 (L) 04/23/2020   PLT 325 04/23/2020   GLUCOSE 115 (H) 04/23/2020   CHOL 152 05/09/2018   TRIG 82 05/09/2018   HDL 38 (L) 05/09/2018   LDLCALC 98 05/09/2018   ALT 18 10/03/2018   AST 17 10/03/2018   NA 142 04/23/2020   K 4.8 04/23/2020   CL 106 04/23/2020   CREATININE 1.15 04/23/2020   BUN 25 04/23/2020   CO2 23 04/23/2020   TSH 2.510 04/23/2020   INR 1.39 08/24/2017   HGBA1C 6.4 (H) 08/20/2017     BNP (last 3 results) No results for input(s): BNP in the last 8760 hours.  ProBNP (last 3 results) No results for input(s): PROBNP in the last 8760 hours.   Other Studies Reviewed Today:  Carotid Doppler Summary 05/2019:  Right Carotid: Velocities in the right ICA are consistent with a 1-39%  stenosis. The ECA appears >50% stenosed. The RICA velocities are  stable  compared to prior exam and are within normal limits.   Left Carotid: Velocities in the left ICA are consistent with a 40-59%  stenosis. The ECA appears >50% stenosed. The LICA velocities are  elevated and have increased when  compared to the prior exam.   Vertebrals:  Bilateral vertebral arteries demonstrate antegrade flow.  Subclavians: Right subclavian artery was stenotic. Bilateral subclavian  artery flow was disturbed. Mild velocities decrease noted in the  right subclavian artery; moderate to significant decrease noted on  the left.   *See table(s) above for measurements and observations.  Suggest follow up study in 12 months.   Electronically signed by Larae Grooms MD on 05/18/2019 at 8:13:39 AM.      TTE 11/11/17  Study Conclusions   - Left ventricle: The cavity size was normal. Wall thickness was   normal. Systolic function was normal. The estimated ejection   fraction was in the range of 60% to 65%. Doppler parameters are   consistent with both elevated ventricular end-diastolic filling   pressure and elevated left atrial filling pressure. - Left atrium: The atrium was mildly dilated. - Atrial septum: No defect or patent foramen ovale was identified. - Pericardium, extracardiac: Moderate localized posterior lateral   pericardial effusion no tamponade       ASSESSMENT AND PLAN:   1.  Tachycardia - HR better on EKG - I am going to increase his CCB up just a bit - recheck lab today.   2. CAD with prior CABG from 2018 - doing well.   3. COPD - does not like to use his nebulizers - encouraged to use this.   4. Iron deficient anemia - rechecking lab today.   5. PAF - in sinus by EKG today - CCB increased today.   6. HLD - intolerant of statin/Repatha - favor not rechecking.   7. Carotid disease - last checked in September of 2021.   8. Chronic pericardial effusion -  no symptoms noted.     Current medicines are reviewed with the patient today.  The patient does not have concerns regarding medicines other than what has been noted above.  The following changes have been made:  See above.  Labs/ tests ordered today include:    Orders Placed This Encounter  Procedures  . Basic  metabolic panel  . CBC  . TSH  . EKG 12-Lead     Disposition:   FU with Tim [name] in about 3 months. Will increase CCB today.    Patient is agreeable to this plan and will call if any problems develop in the interim.   SignedTruitt Merle, NP  10/22/2020 9:26 AM  [facility] [address] [place], [place]  [address] Phone: [phone] Fax: [phone]

## 2020-10-20 ENCOUNTER — Other Ambulatory Visit: Payer: Self-pay | Admitting: Internal Medicine

## 2020-10-22 ENCOUNTER — Encounter: Payer: Self-pay | Admitting: Nurse Practitioner

## 2020-10-22 ENCOUNTER — Ambulatory Visit (INDEPENDENT_AMBULATORY_CARE_PROVIDER_SITE_OTHER): Payer: Medicare Other | Admitting: Nurse Practitioner

## 2020-10-22 ENCOUNTER — Other Ambulatory Visit: Payer: Self-pay

## 2020-10-22 VITALS — BP 118/70 | HR 106 | Ht 71.0 in | Wt 182.0 lb

## 2020-10-22 DIAGNOSIS — I2510 Atherosclerotic heart disease of native coronary artery without angina pectoris: Secondary | ICD-10-CM

## 2020-10-22 DIAGNOSIS — E782 Mixed hyperlipidemia: Secondary | ICD-10-CM

## 2020-10-22 DIAGNOSIS — I480 Paroxysmal atrial fibrillation: Secondary | ICD-10-CM

## 2020-10-22 DIAGNOSIS — I10 Essential (primary) hypertension: Secondary | ICD-10-CM

## 2020-10-22 DIAGNOSIS — I6523 Occlusion and stenosis of bilateral carotid arteries: Secondary | ICD-10-CM | POA: Diagnosis not present

## 2020-10-22 DIAGNOSIS — R000 Tachycardia, unspecified: Secondary | ICD-10-CM

## 2020-10-22 DIAGNOSIS — D509 Iron deficiency anemia, unspecified: Secondary | ICD-10-CM

## 2020-10-22 LAB — TSH: TSH: 2.39 u[IU]/mL (ref 0.450–4.500)

## 2020-10-22 LAB — BASIC METABOLIC PANEL
BUN/Creatinine Ratio: 24 (ref 10–24)
BUN: 27 mg/dL (ref 8–27)
CO2: 23 mmol/L (ref 20–29)
Calcium: 9 mg/dL (ref 8.6–10.2)
Chloride: 106 mmol/L (ref 96–106)
Creatinine, Ser: 1.13 mg/dL (ref 0.76–1.27)
GFR calc Af Amer: 66 mL/min/{1.73_m2} (ref >59)
GFR calc non Af Amer: 57 mL/min/{1.73_m2} — ABNORMAL LOW (ref >59)
Glucose: 131 mg/dL — ABNORMAL HIGH (ref 65–99)
Potassium: 4.8 mmol/L (ref 3.5–5.2)
Sodium: 142 mmol/L (ref 134–144)

## 2020-10-22 LAB — CBC
Hematocrit: 31.9 % — ABNORMAL LOW (ref 37.5–51.0)
Hemoglobin: 9.9 g/dL — ABNORMAL LOW (ref 13.0–17.7)
MCH: 23.6 pg — ABNORMAL LOW (ref 26.6–33.0)
MCHC: 31 g/dL — ABNORMAL LOW (ref 31.5–35.7)
MCV: 76 fL — ABNORMAL LOW (ref 79–97)
Platelets: 383 10*3/uL (ref 150–450)
RBC: 4.19 x10E6/uL (ref 4.14–5.80)
RDW: 16.6 % — ABNORMAL HIGH (ref 11.6–15.4)
WBC: 8.6 10*3/uL (ref 3.4–10.8)

## 2020-10-22 MED ORDER — DILTIAZEM HCL ER COATED BEADS 180 MG PO CP24: 180.0000 mg | ORAL_CAPSULE | Freq: Every day | ORAL | 3 refills | Status: DC

## 2020-10-22 NOTE — Patient Instructions (Addendum)
After Visit Summary:  We will be checking the following labs today - BMET, CBC and TSH   Medication Instructions:    Continue with your current medicines. BUT  I am going to increase the Diltiazem to 180 mg a day - start this tomorrow.  I have sent this to your pharmacy.    If you need a refill on your cardiac medications before your next appointment, please call your pharmacy.     Testing/Procedures To Be Arranged:  N/A  Follow-Up:   See Dr. Marlou Porch in May    At [facility], you and your health needs are our priority.  As part of our continuing mission to provide you with exceptional heart care, we have created designated Provider Care Teams.  These Care Teams include your primary Cardiologist (physician) and Advanced Practice Providers (APPs -  Physician Assistants and Nurse Practitioners) who all work together to provide you with the care you need, when you need it.  Special Instructions:  . Stay safe, wash your hands for at least 20 seconds and wear a mask when needed.  . It was good to talk with you today.  . We will see about giving you a BP cuff to use at home.    Call the [place] office at [phone] if you have any questions, problems or concerns.

## 2020-10-23 ENCOUNTER — Encounter (INDEPENDENT_AMBULATORY_CARE_PROVIDER_SITE_OTHER): Payer: Medicare Other | Admitting: Ophthalmology

## 2020-10-23 DIAGNOSIS — H353221 Exudative age-related macular degeneration, left eye, with active choroidal neovascularization: Secondary | ICD-10-CM

## 2020-10-23 DIAGNOSIS — H33301 Unspecified retinal break, right eye: Secondary | ICD-10-CM

## 2020-10-23 DIAGNOSIS — H43813 Vitreous degeneration, bilateral: Secondary | ICD-10-CM | POA: Diagnosis not present

## 2020-10-23 DIAGNOSIS — H353112 Nonexudative age-related macular degeneration, right eye, intermediate dry stage: Secondary | ICD-10-CM | POA: Diagnosis not present

## 2020-10-25 ENCOUNTER — Other Ambulatory Visit: Payer: Self-pay | Admitting: Internal Medicine

## 2020-10-25 ENCOUNTER — Telehealth: Payer: Self-pay | Admitting: Cardiology

## 2020-10-25 NOTE — Telephone Encounter (Signed)
Tim Junes, NP  10/23/2020 7:37 AM EST      Ok to report. Labs are stable - chronically anemic - would continue on current regimen.  Copy to his PCP please.  Thank him again for the birdhouse please.        Spoke with the pts daughter Tim [name] (on [place]) and endorsed to her the pts lab results and recommendations per Truitt Merle NP.  Daughter verbalized understanding and agrees with this plan.  Daughter states she will endorse these to the pt.

## 2020-10-25 NOTE — Telephone Encounter (Signed)
Patients daughter is returning Danielle's call about results. Please advise.

## 2020-11-27 ENCOUNTER — Other Ambulatory Visit: Payer: Self-pay

## 2020-11-27 ENCOUNTER — Encounter (INDEPENDENT_AMBULATORY_CARE_PROVIDER_SITE_OTHER): Payer: Medicare Other | Admitting: Ophthalmology

## 2020-11-27 DIAGNOSIS — H43813 Vitreous degeneration, bilateral: Secondary | ICD-10-CM | POA: Diagnosis not present

## 2020-11-27 DIAGNOSIS — H33301 Unspecified retinal break, right eye: Secondary | ICD-10-CM

## 2020-11-27 DIAGNOSIS — H353221 Exudative age-related macular degeneration, left eye, with active choroidal neovascularization: Secondary | ICD-10-CM

## 2020-11-27 DIAGNOSIS — H353112 Nonexudative age-related macular degeneration, right eye, intermediate dry stage: Secondary | ICD-10-CM

## 2020-12-10 ENCOUNTER — Other Ambulatory Visit: Payer: Self-pay | Admitting: Cardiology

## 2020-12-25 ENCOUNTER — Encounter (INDEPENDENT_AMBULATORY_CARE_PROVIDER_SITE_OTHER): Payer: Medicare Other | Admitting: Ophthalmology

## 2020-12-25 ENCOUNTER — Other Ambulatory Visit: Payer: Self-pay

## 2020-12-25 DIAGNOSIS — H33301 Unspecified retinal break, right eye: Secondary | ICD-10-CM

## 2020-12-25 DIAGNOSIS — H353112 Nonexudative age-related macular degeneration, right eye, intermediate dry stage: Secondary | ICD-10-CM

## 2020-12-25 DIAGNOSIS — H353221 Exudative age-related macular degeneration, left eye, with active choroidal neovascularization: Secondary | ICD-10-CM | POA: Diagnosis not present

## 2020-12-25 DIAGNOSIS — H43813 Vitreous degeneration, bilateral: Secondary | ICD-10-CM

## 2021-01-22 ENCOUNTER — Other Ambulatory Visit: Payer: Self-pay

## 2021-01-22 ENCOUNTER — Encounter (INDEPENDENT_AMBULATORY_CARE_PROVIDER_SITE_OTHER): Payer: Medicare Other | Admitting: Ophthalmology

## 2021-01-22 DIAGNOSIS — H353112 Nonexudative age-related macular degeneration, right eye, intermediate dry stage: Secondary | ICD-10-CM | POA: Diagnosis not present

## 2021-01-22 DIAGNOSIS — H43813 Vitreous degeneration, bilateral: Secondary | ICD-10-CM

## 2021-01-22 DIAGNOSIS — H353221 Exudative age-related macular degeneration, left eye, with active choroidal neovascularization: Secondary | ICD-10-CM

## 2021-01-22 DIAGNOSIS — H33301 Unspecified retinal break, right eye: Secondary | ICD-10-CM | POA: Diagnosis not present

## 2021-01-28 ENCOUNTER — Other Ambulatory Visit: Payer: Self-pay

## 2021-01-28 ENCOUNTER — Ambulatory Visit (INDEPENDENT_AMBULATORY_CARE_PROVIDER_SITE_OTHER): Payer: Medicare Other | Admitting: Cardiology

## 2021-01-28 ENCOUNTER — Encounter: Payer: Self-pay | Admitting: Cardiology

## 2021-01-28 VITALS — BP 124/60 | HR 90 | Ht 71.0 in | Wt 182.0 lb

## 2021-01-28 DIAGNOSIS — Z951 Presence of aortocoronary bypass graft: Secondary | ICD-10-CM | POA: Diagnosis not present

## 2021-01-28 DIAGNOSIS — I2510 Atherosclerotic heart disease of native coronary artery without angina pectoris: Secondary | ICD-10-CM

## 2021-01-28 DIAGNOSIS — I6523 Occlusion and stenosis of bilateral carotid arteries: Secondary | ICD-10-CM | POA: Diagnosis not present

## 2021-01-28 NOTE — Progress Notes (Signed)
Cardiology Office Note:    Date:  01/28/2021   ID:  Tim [name], DOB 21-Oct-1928, MRN [id number]  PCP:  Tim Bott, MD   [facility] Providers Cardiologist:  Tim Furbish, MD     Referring MD: Tim Bott, MD     History of Present Illness:    Tim [name] is a 87 y.o. male here for the follow-up of bilateral carotid artery disease, CAD with prior STEMI in 2001 with PCI to RCA, residual 100% circumflex with subsequent PCI for in-stent restenosis in June 2001 who then underwent CABG x4 by Dr. Servando [name] in 08/2017-LIMA to LAD, SVG to diagonal, SVG to left circumflex, SVG to PDA.  Has chronic localized pericardial effusion.  Right paralyzed Hemi diaphragm.  Former smoker quit in the 1980s.  Has had prior issues with anemia, chronic dyspnea but overall doing fairly well.  Enjoys woodworking, making bird houses.  Hard of hearing.    Past Medical History:  Diagnosis Date  . AAA (abdominal aortic aneurysm) (HCC)    3.2 cm 07/2016; 2-3 year f/u recommended  . Asthma   . BPH (benign prostatic hypertrophy)   . CAD (coronary artery disease)    9 STEMI 2001, PCI, RCA, residual 100% circumflex  /   PCI for in-stent restenosis June, 2001  /  nuclear February, 2010, no ischemia  . Cancer ([place])    skin cancer removed  . Carotid artery disease ([place])    Doppler, June, 2011, 0-39% bilateral, pt denies  . Cervical disc disease    Status post neurosurgery  . Colon polyps   . Ejection fraction    EF 55-65%, echo, 2009  . Elevated CPK    Chronic mild CPK elevation  . Emphysema    Mild  . Fatigue    Morning fatigue, August, 2011  . GERD (gastroesophageal reflux disease)   . Headache    back of head  . Hyperlipidemia   . Hypertension   . Low back pain    February, 2013  . Myocardial infarction ([place])   . Paralyzed hemidiaphragm    Right  . Paralyzed hemidiaphragm    Chronic  . Statin intolerance    Elevated CPK in the past    Past Surgical History:  Procedure  Laterality Date  . BREAST SURGERY     right breast removed  . CERVICAL DISC SURGERY    . CORONARY ARTERY BYPASS GRAFT N/A 08/24/2017   Procedure: CORONARY ARTERY BYPASS GRAFTING times four using left internal mammary artery and bilateral saphenous vein, using endoscope. TEE;  Surgeon: Grace Isaac, MD;  Location: [facility];  Service: Open Heart Surgery;  Laterality: N/A;  . LEFT HEART CATH AND CORONARY ANGIOGRAPHY N/A 07/29/2017   Procedure: LEFT HEART CATH AND CORONARY ANGIOGRAPHY;  Surgeon: Nelva Bush, MD;  Location: [facility];  Service: Cardiovascular;  Laterality: N/A;  . SKIN CANCER EXCISION    . TEE WITHOUT CARDIOVERSION N/A 08/24/2017   Procedure: TRANSESOPHAGEAL ECHOCARDIOGRAM (TEE);  Surgeon: Grace Isaac, MD;  Location: [facility];  Service: Open Heart Surgery;  Laterality: N/A;    Current Medications: Current Meds  Medication Sig  . acetaminophen (TYLENOL) 500 MG tablet Take 500 mg by mouth every 4 (four) hours as needed for mild pain.  [name] albuterol (PROVENTIL HFA;VENTOLIN HFA) 108 (90 BASE) MCG/ACT inhaler Inhale 2 puffs into the lungs every 6 (six) hours as needed for wheezing or shortness of breath.  [name] aspirin EC 81 MG tablet Take 81 mg  by mouth daily.  [name] BESIVANCE 0.6 % SUSP Apply to eye every 30 (thirty) days. Drops in left eye  . diltiazem (CARDIZEM CD) 180 MG 24 hr capsule Take 1 capsule (180 mg total) by mouth daily.  [name] doxycycline (VIBRAMYCIN) 50 MG capsule Take 50 mg by mouth daily.   . finasteride (PROSCAR) 5 MG tablet Take 10 mg by mouth daily.   . furosemide (LASIX) 40 MG tablet TAKE 1 TABLET (40 MG TOTAL) BY MOUTH DAILY AS NEEDED FOR FLUID.  [name] KLOR-CON M20 20 MEQ tablet TAKE 1 TABLET BY MOUTH EVERY DAY  . montelukast (SINGULAIR) 10 MG tablet TAKE 1 TABLET BY MOUTH EVERYDAY AT BEDTIME  . omeprazole (PRILOSEC) 40 MG capsule TAKE 1 CAPSULE BY MOUTH TWICE A DAY  . sodium chloride HYPERTONIC 3 % nebulizer solution Take 4 mLs by nebulization as needed  for other or cough.  . tamsulosin (FLOMAX) 0.4 MG CAPS capsule Take 0.4 mg by mouth 2 (two) times daily. For urinary symptoms  . TRELEGY ELLIPTA 100-62.5-25 MCG/INH AEPB TAKE 1 PUFF BY MOUTH EVERY DAY     Allergies:   Statins and Repatha [evolocumab]   Social History   Socioeconomic History  . Marital status: Married    Spouse name: Not on file  . Number of children: Not on file  . Years of education: Not on file  . Highest education level: Not on file  Occupational History  . Occupation: retired Games developer  Tobacco Use  . Smoking status: Former Smoker    Packs/day: 3.00    Years: 40.00    Pack years: 120.00    Types: Cigarettes    Quit date: 09/15/1975    Years since quitting: 45.4  . Smokeless tobacco: Never Used  Vaping Use  . Vaping Use: Never used  Substance and Sexual Activity  . Alcohol use: No  . Drug use: No  . Sexual activity: Not on file  Other Topics Concern  . Not on file  Social History Narrative  . Not on file   Social Determinants of Health   Financial Resource Strain: Not on file  Food Insecurity: Not on file  Transportation Needs: Not on file  Physical Activity: Not on file  Stress: Not on file  Social Connections: Not on file     Family History: The patient's family history includes Cancer in his brother; Diabetes in his mother and sister; Kidney disease in his brother; Other in his father and sister.  ROS:   Please see the history of present illness.     All other systems reviewed and are negative.  EKGs/Labs/Other Studies Reviewed:    The following studies were reviewed today: As below    Recent Labs: 10/22/2020: BUN 27; Creatinine, Ser 1.13; Hemoglobin 9.9; Platelets 383; Potassium 4.8; Sodium 142; TSH 2.390  Recent Lipid Panel    Component Value Date/Time   CHOL 152 05/09/2018 0909   TRIG 82 05/09/2018 0909   HDL 38 (L) 05/09/2018 0909   CHOLHDL 4.0 05/09/2018 0909   LDLCALC 98 05/09/2018 0909     Risk  Assessment/Calculations:      Physical Exam:    VS:  BP 124/60 (BP Location: Left Arm, Patient Position: Sitting, Cuff Size: Normal)   Pulse 90   Ht 5\' 11"  (1.803 m)   Wt 182 lb (82.6 kg)   SpO2 98%   BMI 25.38 kg/m     Wt Readings from Last 3 Encounters:  01/28/21 182 lb (82.6 kg)  10/22/20 182 lb (  82.6 kg)  05/01/20 186 lb (84.4 kg)     GEN:  Well nourished, well developed in no acute distress HEENT: Normal NECK: No JVD; No carotid bruits LYMPHATICS: No lymphadenopathy CARDIAC: RRR, no murmurs, rubs, gallops RESPIRATORY:  Wheezing B  ABDOMEN: Soft, non-tender, non-distended MUSCULOSKELETAL:  2+ LE Left >r edema; No deformity  SKIN: Warm and dry NEUROLOGIC:  Alert and oriented x 3 PSYCHIATRIC:  Normal affect   ASSESSMENT:    1. Bilateral carotid artery stenosis   2. Coronary artery disease involving native coronary artery of native heart without angina pectoris   3. S/P CABG x 4    PLAN:    In order of problems listed above:  Coronary artery disease - CABG 2018 following prior PCI's.  Overall doing well.  No anginal symptoms.  Continue with goal-directed medical therapy.  COPD - Encourage use of nebulizers. A little tight at times. Seeing Dr. Lenise Arena at [facility].   Paroxysmal atrial fibrillation - Remains in sinus rhythm.  Calcium channel blocker Cardizem CD 180 mg once a day is currently used as prescription drug management.  Continue.  Refills as needed.  Hyperlipidemia - Has had trouble in the past with statin as well as Repatha.  Does not wish to be rechecked.  HTN --When to low feels poor.   Carotid artery disease  05/2020 -Right Carotid: Velocities in the right ICA are consistent with a 40-59%         stenosis. Non-hemodynamically significant plaque <50% noted  in         the CCA. The ECA appears >50% stenosed.   Left Carotid: Non-hemodynamically significant plaque <50% noted in the  CCA. The        ECA appears >50%  stenosed.  LE edema --Venous insuff. Compression. Left knee pain.   Enjoys bird house building.  Sold to [place]  1 yr f/u    Medication Adjustments/Labs and Tests Ordered: Current medicines are reviewed at length with the patient today.  Concerns regarding medicines are outlined above.  No orders of the defined types were placed in this encounter.  No orders of the defined types were placed in this encounter.   Patient Instructions  Medication Instructions:  The current medical regimen is effective;  continue present plan and medications.  *If you need a refill on your cardiac medications before your next appointment, please call your pharmacy*  Follow-Up: At [facility], you and your health needs are our priority.  As part of our continuing mission to provide you with exceptional heart care, we have created designated Provider Care Teams.  These Care Teams include your primary Cardiologist (physician) and Advanced Practice Providers (APPs -  Physician Assistants and Nurse Practitioners) who all work together to provide you with the care you need, when you need it.  We recommend signing up for the patient portal called "MyChart".  Sign up information is provided on this After Visit Summary.  MyChart is used to connect with patients for Virtual Visits (Telemedicine).  Patients are able to view lab/test results, encounter notes, upcoming appointments, etc.  Non-urgent messages can be sent to your provider as well.   To learn more about what you can do with MyChart, go to [email or web address].    Your next appointment:   12 month(s)  The format for your next appointment:   In Person  Provider:   Candee Furbish, MD   Thank you for choosing [facility]!!  Signed, Tim Furbish, MD  01/28/2021 11:14 AM    [place]

## 2021-01-28 NOTE — Patient Instructions (Signed)
Medication Instructions:  The current medical regimen is effective;  continue present plan and medications.  *If you need a refill on your cardiac medications before your next appointment, please call your pharmacy*  Follow-Up: At [facility], you and your health needs are our priority.  As part of our continuing mission to provide you with exceptional heart care, we have created designated Provider Care Teams.  These Care Teams include your primary Cardiologist (physician) and Advanced Practice Providers (APPs -  Physician Assistants and Nurse Practitioners) who all work together to provide you with the care you need, when you need it.  We recommend signing up for the patient portal called "MyChart".  Sign up information is provided on this After Visit Summary.  MyChart is used to connect with patients for Virtual Visits (Telemedicine).  Patients are able to view lab/test results, encounter notes, upcoming appointments, etc.  Non-urgent messages can be sent to your provider as well.   To learn more about what you can do with MyChart, go to [email or web address].    Your next appointment:   12 month(s)  The format for your next appointment:   In Person  Provider:   Mark Skains, MD   Thank you for choosing [facility]!!      

## 2021-02-12 ENCOUNTER — Ambulatory Visit: Payer: Medicare Other | Admitting: Cardiology

## 2021-02-19 ENCOUNTER — Other Ambulatory Visit: Payer: Self-pay

## 2021-02-19 ENCOUNTER — Encounter (INDEPENDENT_AMBULATORY_CARE_PROVIDER_SITE_OTHER): Payer: Medicare Other | Admitting: Ophthalmology

## 2021-02-19 DIAGNOSIS — H353112 Nonexudative age-related macular degeneration, right eye, intermediate dry stage: Secondary | ICD-10-CM

## 2021-02-19 DIAGNOSIS — H43813 Vitreous degeneration, bilateral: Secondary | ICD-10-CM | POA: Diagnosis not present

## 2021-02-19 DIAGNOSIS — H353221 Exudative age-related macular degeneration, left eye, with active choroidal neovascularization: Secondary | ICD-10-CM

## 2021-02-19 DIAGNOSIS — H33301 Unspecified retinal break, right eye: Secondary | ICD-10-CM | POA: Diagnosis not present

## 2021-02-25 ENCOUNTER — Other Ambulatory Visit: Payer: Medicare Other

## 2021-03-26 ENCOUNTER — Encounter (INDEPENDENT_AMBULATORY_CARE_PROVIDER_SITE_OTHER): Payer: Medicare Other | Admitting: Ophthalmology

## 2021-03-26 ENCOUNTER — Other Ambulatory Visit: Payer: Self-pay

## 2021-03-26 DIAGNOSIS — H33301 Unspecified retinal break, right eye: Secondary | ICD-10-CM | POA: Diagnosis not present

## 2021-03-26 DIAGNOSIS — H353221 Exudative age-related macular degeneration, left eye, with active choroidal neovascularization: Secondary | ICD-10-CM | POA: Diagnosis not present

## 2021-03-26 DIAGNOSIS — H43813 Vitreous degeneration, bilateral: Secondary | ICD-10-CM | POA: Diagnosis not present

## 2021-03-26 DIAGNOSIS — H353112 Nonexudative age-related macular degeneration, right eye, intermediate dry stage: Secondary | ICD-10-CM

## 2021-04-30 ENCOUNTER — Encounter (INDEPENDENT_AMBULATORY_CARE_PROVIDER_SITE_OTHER): Payer: Medicare Other | Admitting: Ophthalmology

## 2021-04-30 ENCOUNTER — Other Ambulatory Visit: Payer: Self-pay

## 2021-04-30 DIAGNOSIS — H33301 Unspecified retinal break, right eye: Secondary | ICD-10-CM

## 2021-04-30 DIAGNOSIS — H353221 Exudative age-related macular degeneration, left eye, with active choroidal neovascularization: Secondary | ICD-10-CM

## 2021-04-30 DIAGNOSIS — H43813 Vitreous degeneration, bilateral: Secondary | ICD-10-CM | POA: Diagnosis not present

## 2021-04-30 DIAGNOSIS — H353112 Nonexudative age-related macular degeneration, right eye, intermediate dry stage: Secondary | ICD-10-CM

## 2021-05-23 ENCOUNTER — Ambulatory Visit (HOSPITAL_COMMUNITY)
Admission: RE | Admit: 2021-05-23 | Discharge: 2021-05-23 | Disposition: A | Discharge status: Home / Self Care | Payer: Medicare Other | Source: Ambulatory Visit | Attending: Cardiovascular Disease | Admitting: Cardiovascular Disease

## 2021-05-23 ENCOUNTER — Other Ambulatory Visit (HOSPITAL_COMMUNITY): Payer: Self-pay | Admitting: Cardiology

## 2021-05-23 ENCOUNTER — Other Ambulatory Visit: Payer: Self-pay

## 2021-05-23 DIAGNOSIS — I6523 Occlusion and stenosis of bilateral carotid arteries: Secondary | ICD-10-CM

## 2021-05-27 ENCOUNTER — Telehealth: Payer: Self-pay | Admitting: *Deleted

## 2021-05-27 DIAGNOSIS — E782 Mixed hyperlipidemia: Secondary | ICD-10-CM

## 2021-05-27 DIAGNOSIS — I6523 Occlusion and stenosis of bilateral carotid arteries: Secondary | ICD-10-CM

## 2021-05-27 MED ORDER — EZETIMIBE 10 MG PO TABS: 10.0000 mg | ORAL_TABLET | Freq: Every day | ORAL | 3 refills | Status: DC

## 2021-05-27 NOTE — Telephone Encounter (Signed)
-----   Message from Isaiah Serge, NP sent at 05/26/2021  8:16 PM EDT ----- Tell pt his carotid disease is slowly increasing.  He is intolerant of statins, will try zetia 10 mg daily to slow build up.  If he would like to go to lipid clinic we can make arrangements but to go on zetia anyway and check lipids and hepatic in 6 weeks.  Thanks.  . .  ----- Message ----- From: Jerline Pain, MD Sent: 05/26/2021   6:32 AM EDT To: Isaiah Serge, NP  Perhaps Zetia. Candee Furbish, MD  ----- Message ----- From: Isaiah Serge, NP Sent: 05/23/2021  12:44 PM EDT To: Isaiah Serge, NP, Jerline Pain, MD  Gentleman with hx of carotid disease and now increase of Lt carotid disease now 60-79%- cannot take statins or repatha.   Any recommendations?

## 2021-05-27 NOTE — Addendum Note (Signed)
Addended by: Gaetano Net on: 05/27/2021 09:10 AM   Modules accepted: Orders

## 2021-06-04 ENCOUNTER — Other Ambulatory Visit: Payer: Self-pay

## 2021-06-04 ENCOUNTER — Encounter (INDEPENDENT_AMBULATORY_CARE_PROVIDER_SITE_OTHER): Payer: Medicare Other | Admitting: Ophthalmology

## 2021-06-04 DIAGNOSIS — H33301 Unspecified retinal break, right eye: Secondary | ICD-10-CM

## 2021-06-04 DIAGNOSIS — H353112 Nonexudative age-related macular degeneration, right eye, intermediate dry stage: Secondary | ICD-10-CM

## 2021-06-04 DIAGNOSIS — H43813 Vitreous degeneration, bilateral: Secondary | ICD-10-CM | POA: Diagnosis not present

## 2021-06-04 DIAGNOSIS — H353221 Exudative age-related macular degeneration, left eye, with active choroidal neovascularization: Secondary | ICD-10-CM | POA: Diagnosis not present

## 2021-06-12 ENCOUNTER — Ambulatory Visit (INDEPENDENT_AMBULATORY_CARE_PROVIDER_SITE_OTHER): Payer: Medicare Other | Admitting: Pharmacist

## 2021-06-12 ENCOUNTER — Other Ambulatory Visit: Payer: Self-pay

## 2021-06-12 DIAGNOSIS — E7800 Pure hypercholesterolemia, unspecified: Secondary | ICD-10-CM

## 2021-06-12 DIAGNOSIS — T466X5A Adverse effect of antihyperlipidemic and antiarteriosclerotic drugs, initial encounter: Secondary | ICD-10-CM | POA: Diagnosis not present

## 2021-06-12 DIAGNOSIS — Z789 Other specified health status: Secondary | ICD-10-CM

## 2021-06-12 DIAGNOSIS — M791 Myalgia, unspecified site: Secondary | ICD-10-CM | POA: Diagnosis not present

## 2021-06-12 NOTE — Patient Instructions (Addendum)
I will look into the cost of Leqvio and will let you know  Continue zetia 10mg  daily for now  Call me at [phone] with any questions

## 2021-06-12 NOTE — Progress Notes (Signed)
Patient ID: SMILEY BIRR                 DOB: [birth year]                    MRN: [id number]     HPI: Tim [name] is a 87 y.o. male patient of Dr. Marlou [name] referred to lipid clinic by Tim Kicks, NP. PMH is significant for bilateral carotid artery disease, CAD with prior STEMI in 2001 with PCI to RCA, residual 100% circumflex with subsequent PCI for in-stent restenosis in June 2001 who then underwent CABG x4 by Dr. Servando Snare in 08/2017. Patient had repeat carotid scan done that showed worsening carotid disease. Was placed on zetia due to statin intolerance and referred to lipid clinic. Seen previously in lipid clinic in 2019.  Patient presents today to lipid clinic accompanied by his daughter, Tim [name]. He states he is doing ok with the ezetimibe. States he has some joint pain, but can "live with it if it doesn't get any worse." Stays active, but is slowing down at 87 years old.  Current Medications: ezetimibe 10mg  daily Intolerances: statins, Repatha (flu like symptoms), Lipitor, Crestor, pravastatin?, Zocor (muscle aces in legs and lower back, per Dr. Ron Parker pt also experienced CPK elevations on statins) Risk Factors: worsening carotid artery stenosis, hx of progressive CAD LDL goal: <55  Diet: [employer] for breakfast, leftovers, vienea sausage for lunch, daughter in law cooks dinner- eats whatever he wants.  Exercise: woodworking, cant walk far  Family History:  Family History  Problem Relation Age of Onset   Diabetes Mother    Other Father    Diabetes Sister    Kidney disease Brother    Cancer Brother    Other Sister     Social History: Former smoker quit in the [place]: 04/22/2021 TC 202, HDL 35, LDL 140, non HDL 167 TG 137  Past Medical History:  Diagnosis Date   AAA (abdominal aortic aneurysm) ([place])    3.2 cm 07/2016; 2-3 year f/u recommended   Asthma    BPH (benign prostatic hypertrophy)    CAD (coronary artery disease)    9 STEMI 2001, PCI, RCA, residual 100%  circumflex  /   PCI for in-stent restenosis June, 2001  /  nuclear February, 2010, no ischemia   Cancer [facility])    skin cancer removed   Carotid artery disease ([place])    Doppler, June, 2011, 0-39% bilateral, pt denies   Cervical disc disease    Status post neurosurgery   Colon polyps    Ejection fraction    EF 55-65%, echo, 2009   Elevated CPK    Chronic mild CPK elevation   Emphysema    Mild   Fatigue    Morning fatigue, August, 2011   GERD (gastroesophageal reflux disease)    Headache    back of head   Hyperlipidemia    Hypertension    Low back pain    February, 2013   Myocardial infarction [facility])    Paralyzed hemidiaphragm    Right   Paralyzed hemidiaphragm    Chronic   Statin intolerance    Elevated CPK in the past    Current Outpatient Medications on File Prior to Visit  Medication Sig Dispense Refill   acetaminophen (TYLENOL) 500 MG tablet Take 500 mg by mouth every 4 (four) hours as needed for mild pain.     albuterol (PROVENTIL HFA;VENTOLIN HFA) 108 (90 BASE) MCG/ACT inhaler Inhale 2 puffs  into the lungs every 6 (six) hours as needed for wheezing or shortness of breath. 1 Inhaler 6   aspirin EC 81 MG tablet Take 81 mg by mouth daily.     BESIVANCE 0.6 % SUSP Apply to eye every 30 (thirty) days. Drops in left eye     diltiazem (CARDIZEM CD) 180 MG 24 hr capsule Take 1 capsule (180 mg total) by mouth daily. 90 capsule 3   doxycycline (VIBRAMYCIN) 50 MG capsule Take 50 mg by mouth daily.      ezetimibe (ZETIA) 10 MG tablet Take 1 tablet (10 mg total) by mouth daily. 90 tablet 3   finasteride (PROSCAR) 5 MG tablet Take 10 mg by mouth daily.      furosemide (LASIX) 40 MG tablet TAKE 1 TABLET (40 MG TOTAL) BY MOUTH DAILY AS NEEDED FOR FLUID. 90 tablet 3   KLOR-CON M20 20 MEQ tablet TAKE 1 TABLET BY MOUTH EVERY DAY 90 tablet 3   montelukast (SINGULAIR) 10 MG tablet TAKE 1 TABLET BY MOUTH EVERYDAY AT BEDTIME 90 tablet 3   omeprazole (PRILOSEC) 40 MG capsule TAKE 1 CAPSULE BY  MOUTH TWICE A DAY 180 capsule 3   sodium chloride HYPERTONIC 3 % nebulizer solution Take 4 mLs by nebulization as needed for other or cough.     tamsulosin (FLOMAX) 0.4 MG CAPS capsule Take 0.4 mg by mouth 2 (two) times daily. For urinary symptoms     TRELEGY ELLIPTA 100-62.5-25 MCG/INH AEPB TAKE 1 PUFF BY MOUTH EVERY DAY 60 each 5   No current facility-administered medications on file prior to visit.    Allergies  Allergen Reactions   Statins Other (See Comments)    CLASS EFFECT SEVERE LEG CRAMPS [MULTIPLE STATINS]   Repatha [Evolocumab]     Flu like symptoms    Assessment/Plan:  1. Hyperlipidemia - LDL is above goal of <55 (or <70). Patient not interested in trying Praluent due to flu like reaction to [facility]. Intolerant to multiple statins. We discussed the small decrease in LDL zetia provides and its time to benefit is about 7 years (plus that's with statin which he is not on). Discussed Leqvio. Will do an investigation into benefits to see how much Leqvio would cost. Patient will think about if he would like to try. Continue zetia for now. Can stop if we start [place].    Thank you,   Ramond Dial, Pharm.D, BCPS, CPP [place]  [address], [place], [place] [address]  Phone: [phone]; Fax: [phone]

## 2021-06-18 ENCOUNTER — Telehealth: Payer: Self-pay | Admitting: Pharmacist

## 2021-06-18 NOTE — Telephone Encounter (Signed)
Per Leqvio portal, patient copay would be $94 through [facility] state health plan.  Maylon Peppers, patient's daughter and advised her of the above. States she will discuss with patient and let us know.

## 2021-07-08 ENCOUNTER — Telehealth: Payer: Self-pay | Admitting: Cardiology

## 2021-07-08 NOTE — Telephone Encounter (Signed)
Called patient's daughter back. She decided to come to [place] instead of trying to find a [employer] near by.

## 2021-07-08 NOTE — Telephone Encounter (Signed)
Pt's daughter [name] [facility]) wants to know if pt can have his labs done at his PCP Dr. Steva Ready office at [facility] in [place] [phone] because the pt lives more than an hour from [facility]

## 2021-07-10 ENCOUNTER — Other Ambulatory Visit: Payer: Self-pay

## 2021-07-10 ENCOUNTER — Other Ambulatory Visit: Payer: Medicare Other | Admitting: *Deleted

## 2021-07-10 DIAGNOSIS — E782 Mixed hyperlipidemia: Secondary | ICD-10-CM

## 2021-07-10 DIAGNOSIS — I6523 Occlusion and stenosis of bilateral carotid arteries: Secondary | ICD-10-CM

## 2021-07-10 LAB — LIPID PANEL
Chol/HDL Ratio: 4.5 ratio (ref 0.0–5.0)
Cholesterol, Total: 159 mg/dL (ref 100–199)
HDL: 35 mg/dL — ABNORMAL LOW (ref >39)
LDL Chol Calc (NIH): 104 mg/dL — ABNORMAL HIGH (ref 0–99)
Triglycerides: 110 mg/dL (ref 0–149)
VLDL Cholesterol Cal: 20 mg/dL (ref 5–40)

## 2021-07-10 LAB — HEPATIC FUNCTION PANEL
ALT: 13 IU/L (ref 0–44)
AST: 11 IU/L (ref 0–40)
Albumin: 3.6 g/dL (ref 3.5–4.6)
Alkaline Phosphatase: 98 IU/L (ref 44–121)
Bilirubin Total: 0.2 mg/dL (ref 0.0–1.2)
Bilirubin, Direct: <0.1 mg/dL (ref 0.00–0.40)
Total Protein: 5.9 g/dL — ABNORMAL LOW (ref 6.0–8.5)

## 2021-07-16 ENCOUNTER — Other Ambulatory Visit: Payer: Self-pay

## 2021-07-16 ENCOUNTER — Encounter (INDEPENDENT_AMBULATORY_CARE_PROVIDER_SITE_OTHER): Payer: Medicare Other | Admitting: Ophthalmology

## 2021-07-16 DIAGNOSIS — H43813 Vitreous degeneration, bilateral: Secondary | ICD-10-CM

## 2021-07-16 DIAGNOSIS — H353112 Nonexudative age-related macular degeneration, right eye, intermediate dry stage: Secondary | ICD-10-CM

## 2021-07-16 DIAGNOSIS — H353221 Exudative age-related macular degeneration, left eye, with active choroidal neovascularization: Secondary | ICD-10-CM | POA: Diagnosis not present

## 2021-07-16 DIAGNOSIS — H33301 Unspecified retinal break, right eye: Secondary | ICD-10-CM | POA: Diagnosis not present

## 2021-07-18 ENCOUNTER — Telehealth: Payer: Self-pay | Admitting: Pharmacist

## 2021-07-18 NOTE — Telephone Encounter (Signed)
I spoke with patients daughter. Reviewed lab results and the option of Leqvio. Advised that rep thinks he could use copay card and get for even cheaper than $94/injection. Daughter will discuss with pt and call us back if he would like to proceed.

## 2021-07-18 NOTE — Telephone Encounter (Signed)
Called pt daughter, [name], to discuss lipids. Previously discussed Leqvio, but did not hear back. Will follow up and review lipids.

## 2021-08-24 ENCOUNTER — Other Ambulatory Visit: Payer: Self-pay | Admitting: Internal Medicine

## 2021-08-27 ENCOUNTER — Encounter (INDEPENDENT_AMBULATORY_CARE_PROVIDER_SITE_OTHER): Payer: Medicare Other | Admitting: Ophthalmology

## 2021-08-27 ENCOUNTER — Other Ambulatory Visit: Payer: Self-pay

## 2021-08-27 DIAGNOSIS — H33301 Unspecified retinal break, right eye: Secondary | ICD-10-CM

## 2021-08-27 DIAGNOSIS — H353221 Exudative age-related macular degeneration, left eye, with active choroidal neovascularization: Secondary | ICD-10-CM

## 2021-08-27 DIAGNOSIS — H43813 Vitreous degeneration, bilateral: Secondary | ICD-10-CM

## 2021-08-27 DIAGNOSIS — H353112 Nonexudative age-related macular degeneration, right eye, intermediate dry stage: Secondary | ICD-10-CM

## 2021-08-29 ENCOUNTER — Other Ambulatory Visit: Payer: Self-pay

## 2021-08-29 MED ORDER — DILTIAZEM HCL ER COATED BEADS 180 MG PO CP24: 180.0000 mg | ORAL_CAPSULE | Freq: Every day | ORAL | 1 refills | Status: DC

## 2021-09-12 ENCOUNTER — Telehealth: Payer: Self-pay | Admitting: Physician Assistant

## 2021-09-12 ENCOUNTER — Inpatient Hospital Stay (HOSPITAL_COMMUNITY)
Admission: EM | Admit: 2021-09-12 | Discharge: 2021-09-16 | DRG: 316 | Disposition: A | Discharge status: Home / Self Care | Payer: Medicare Other | Source: Other Acute Inpatient Hospital | Attending: Cardiology | Admitting: Cardiology

## 2021-09-12 DIAGNOSIS — D509 Iron deficiency anemia, unspecified: Secondary | ICD-10-CM | POA: Diagnosis present

## 2021-09-12 DIAGNOSIS — Z87891 Personal history of nicotine dependence: Secondary | ICD-10-CM

## 2021-09-12 DIAGNOSIS — K5909 Other constipation: Secondary | ICD-10-CM | POA: Diagnosis present

## 2021-09-12 DIAGNOSIS — K219 Gastro-esophageal reflux disease without esophagitis: Secondary | ICD-10-CM | POA: Diagnosis present

## 2021-09-12 DIAGNOSIS — N400 Enlarged prostate without lower urinary tract symptoms: Secondary | ICD-10-CM | POA: Diagnosis present

## 2021-09-12 DIAGNOSIS — Z951 Presence of aortocoronary bypass graft: Secondary | ICD-10-CM

## 2021-09-12 DIAGNOSIS — I309 Acute pericarditis, unspecified: Secondary | ICD-10-CM | POA: Diagnosis present

## 2021-09-12 DIAGNOSIS — Z20822 Contact with and (suspected) exposure to covid-19: Secondary | ICD-10-CM | POA: Diagnosis present

## 2021-09-12 DIAGNOSIS — I5189 Other ill-defined heart diseases: Secondary | ICD-10-CM

## 2021-09-12 DIAGNOSIS — Z833 Family history of diabetes mellitus: Secondary | ICD-10-CM

## 2021-09-12 DIAGNOSIS — J439 Emphysema, unspecified: Secondary | ICD-10-CM | POA: Diagnosis present

## 2021-09-12 DIAGNOSIS — R079 Chest pain, unspecified: Secondary | ICD-10-CM | POA: Diagnosis not present

## 2021-09-12 DIAGNOSIS — R9431 Abnormal electrocardiogram [ECG] [EKG]: Secondary | ICD-10-CM | POA: Diagnosis not present

## 2021-09-12 DIAGNOSIS — Z79899 Other long term (current) drug therapy: Secondary | ICD-10-CM

## 2021-09-12 DIAGNOSIS — I10 Essential (primary) hypertension: Secondary | ICD-10-CM | POA: Diagnosis present

## 2021-09-12 DIAGNOSIS — I3139 Other pericardial effusion (noninflammatory): Secondary | ICD-10-CM | POA: Diagnosis not present

## 2021-09-12 DIAGNOSIS — D649 Anemia, unspecified: Secondary | ICD-10-CM | POA: Diagnosis not present

## 2021-09-12 DIAGNOSIS — I700 Atherosclerosis of aorta: Secondary | ICD-10-CM | POA: Diagnosis not present

## 2021-09-12 DIAGNOSIS — Z7982 Long term (current) use of aspirin: Secondary | ICD-10-CM

## 2021-09-12 DIAGNOSIS — I319 Disease of pericardium, unspecified: Secondary | ICD-10-CM

## 2021-09-12 DIAGNOSIS — I209 Angina pectoris, unspecified: Secondary | ICD-10-CM | POA: Diagnosis not present

## 2021-09-12 DIAGNOSIS — Z85828 Personal history of other malignant neoplasm of skin: Secondary | ICD-10-CM

## 2021-09-12 DIAGNOSIS — I2510 Atherosclerotic heart disease of native coronary artery without angina pectoris: Secondary | ICD-10-CM | POA: Diagnosis present

## 2021-09-12 DIAGNOSIS — E785 Hyperlipidemia, unspecified: Secondary | ICD-10-CM | POA: Diagnosis present

## 2021-09-12 DIAGNOSIS — I252 Old myocardial infarction: Secondary | ICD-10-CM

## 2021-09-12 DIAGNOSIS — I25118 Atherosclerotic heart disease of native coronary artery with other forms of angina pectoris: Secondary | ICD-10-CM | POA: Diagnosis not present

## 2021-09-12 DIAGNOSIS — R0789 Other chest pain: Secondary | ICD-10-CM | POA: Diagnosis present

## 2021-09-12 NOTE — Telephone Encounter (Signed)
° °  Dr. Livia Snellen called because Mr. Stockert went to the [facility] ER today for left-sided chest pain.  He describes the pain is sharp.  He also sometimes has left arm pain.  He has some shortness of breath and feels that there is something in his chest that he needs to cough up.  Cardiac enzymes are negative.  As part of his evaluation, she did a CT.  It showed an 11 x 8 x 4 cm mass in the left side of his chest.  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  He does not want further treatment there, he wants to come back to [facility] where his cardiologist is.  Spoke with Dr. Quentin Ore.  Dr. Quentin Ore recommends hospital to hospital transfer as Mr. Buckwalter will need further evaluation by either cardiology interventional list or cardiac surgeons.  I called Dr. Livia Snellen back and advised her of this and I called [facility] to request a bed and transfer with Dr. Quentin Ore as the requesting physician.  They will contact me when there is more information.  At this time, he is pain-free and resting comfortably with stable vital signs.  Rosaria Ferries, PA-C 09/12/2021 7:31 PM

## 2021-09-13 ENCOUNTER — Inpatient Hospital Stay (HOSPITAL_COMMUNITY): Payer: Medicare Other

## 2021-09-13 ENCOUNTER — Encounter (HOSPITAL_COMMUNITY): Payer: Self-pay | Admitting: Cardiology

## 2021-09-13 ENCOUNTER — Other Ambulatory Visit: Payer: Self-pay

## 2021-09-13 DIAGNOSIS — I209 Angina pectoris, unspecified: Secondary | ICD-10-CM | POA: Diagnosis not present

## 2021-09-13 DIAGNOSIS — R079 Chest pain, unspecified: Secondary | ICD-10-CM

## 2021-09-13 DIAGNOSIS — I700 Atherosclerosis of aorta: Secondary | ICD-10-CM | POA: Diagnosis not present

## 2021-09-13 HISTORY — DX: Chest pain, unspecified: R07.9

## 2021-09-13 LAB — COMPREHENSIVE METABOLIC PANEL
ALT: 11 U/L (ref 0–44)
AST: 14 U/L — ABNORMAL LOW (ref 15–41)
Albumin: 3.1 g/dL — ABNORMAL LOW (ref 3.5–5.0)
Alkaline Phosphatase: 78 U/L (ref 38–126)
Anion gap: 9 (ref 5–15)
BUN: 29 mg/dL — ABNORMAL HIGH (ref 8–23)
CO2: 24 mmol/L (ref 22–32)
Calcium: 8.5 mg/dL — ABNORMAL LOW (ref 8.9–10.3)
Chloride: 103 mmol/L (ref 98–111)
Creatinine, Ser: 1.48 mg/dL — ABNORMAL HIGH (ref 0.61–1.24)
GFR, Estimated: 45 mL/min — ABNORMAL LOW (ref >60)
Glucose, Bld: 137 mg/dL — ABNORMAL HIGH (ref 70–99)
Potassium: 3.5 mmol/L (ref 3.5–5.1)
Sodium: 136 mmol/L (ref 135–145)
Total Bilirubin: 0.5 mg/dL (ref 0.3–1.2)
Total Protein: 6.5 g/dL (ref 6.5–8.1)

## 2021-09-13 LAB — CBC WITH DIFFERENTIAL/PLATELET
Abs Immature Granulocytes: 0.02 10*3/uL (ref 0.00–0.07)
Basophils Absolute: 0 10*3/uL (ref 0.0–0.1)
Basophils Relative: 1 %
Eosinophils Absolute: 0.2 10*3/uL (ref 0.0–0.5)
Eosinophils Relative: 3 %
HCT: 28.5 % — ABNORMAL LOW (ref 39.0–52.0)
Hemoglobin: 8.5 g/dL — ABNORMAL LOW (ref 13.0–17.0)
Immature Granulocytes: 0 %
Lymphocytes Relative: 17 %
Lymphs Abs: 1.3 10*3/uL (ref 0.7–4.0)
MCH: 23 pg — ABNORMAL LOW (ref 26.0–34.0)
MCHC: 29.8 g/dL — ABNORMAL LOW (ref 30.0–36.0)
MCV: 77.2 fL — ABNORMAL LOW (ref 80.0–100.0)
Monocytes Absolute: 0.6 10*3/uL (ref 0.1–1.0)
Monocytes Relative: 8 %
Neutro Abs: 5.2 10*3/uL (ref 1.7–7.7)
Neutrophils Relative %: 71 %
Platelets: 364 10*3/uL (ref 150–400)
RBC: 3.69 MIL/uL — ABNORMAL LOW (ref 4.22–5.81)
RDW: 18.6 % — ABNORMAL HIGH (ref 11.5–15.5)
WBC: 7.4 10*3/uL (ref 4.0–10.5)
nRBC: 0 % (ref 0.0–0.2)

## 2021-09-13 LAB — CBC
HCT: 27.9 % — ABNORMAL LOW (ref 39.0–52.0)
Hemoglobin: 8.5 g/dL — ABNORMAL LOW (ref 13.0–17.0)
MCH: 23.3 pg — ABNORMAL LOW (ref 26.0–34.0)
MCHC: 30.5 g/dL (ref 30.0–36.0)
MCV: 76.4 fL — ABNORMAL LOW (ref 80.0–100.0)
Platelets: 352 10*3/uL (ref 150–400)
RBC: 3.65 MIL/uL — ABNORMAL LOW (ref 4.22–5.81)
RDW: 18.6 % — ABNORMAL HIGH (ref 11.5–15.5)
WBC: 8 10*3/uL (ref 4.0–10.5)
nRBC: 0 % (ref 0.0–0.2)

## 2021-09-13 LAB — HEPARIN LEVEL (UNFRACTIONATED)
Heparin Unfractionated: <0.1 IU/mL — ABNORMAL LOW (ref 0.30–0.70)
Heparin Unfractionated: <0.1 IU/mL — ABNORMAL LOW (ref 0.30–0.70)

## 2021-09-13 LAB — TROPONIN I (HIGH SENSITIVITY)
Troponin I (High Sensitivity): 7 ng/L (ref <18)
Troponin I (High Sensitivity): 7 ng/L (ref <18)

## 2021-09-13 LAB — HEMOGLOBIN A1C
Hgb A1c MFr Bld: 7.1 % — ABNORMAL HIGH (ref 4.8–5.6)
Mean Plasma Glucose: 157.07 mg/dL

## 2021-09-13 LAB — GLUCOSE, CAPILLARY: Glucose-Capillary: 94 mg/dL (ref 70–99)

## 2021-09-13 LAB — APTT: aPTT: 33 seconds (ref 24–36)

## 2021-09-13 LAB — TSH: TSH: 3.893 u[IU]/mL (ref 0.350–4.500)

## 2021-09-13 MED ORDER — SODIUM CHLORIDE 0.9% FLUSH
3.0000 mL | Freq: Two times a day (BID) | INTRAVENOUS | Status: DC
  Administered 2021-09-13 – 2021-09-15 (×6): 3 mL via INTRAVENOUS

## 2021-09-13 MED ORDER — HEPARIN BOLUS VIA INFUSION
4000.0000 [IU] | Freq: Once | INTRAVENOUS | Status: AC
  Administered 2021-09-13: 4000 [IU] via INTRAVENOUS
  Filled 2021-09-13: qty 4000

## 2021-09-13 MED ORDER — FLUTICASONE FUROATE-VILANTEROL 100-25 MCG/ACT IN AEPB
1.0000 | INHALATION_SPRAY | Freq: Every day | RESPIRATORY_TRACT | Status: DC
  Administered 2021-09-13 – 2021-09-15 (×3): 1 via RESPIRATORY_TRACT
  Filled 2021-09-13: qty 28

## 2021-09-13 MED ORDER — SODIUM CHLORIDE 0.9 % IV SOLN: INTRAVENOUS | Status: AC

## 2021-09-13 MED ORDER — METOPROLOL TARTRATE 25 MG PO TABS
25.0000 mg | ORAL_TABLET | Freq: Two times a day (BID) | ORAL | Status: DC
  Administered 2021-09-13: 25 mg via ORAL
  Filled 2021-09-13: qty 1

## 2021-09-13 MED ORDER — FUROSEMIDE 40 MG PO TABS: 40.0000 mg | ORAL_TABLET | Freq: Every day | ORAL | Status: DC | PRN

## 2021-09-13 MED ORDER — DILTIAZEM HCL ER COATED BEADS 180 MG PO CP24
180.0000 mg | ORAL_CAPSULE | Freq: Every day | ORAL | Status: DC
  Administered 2021-09-13 – 2021-09-16 (×4): 180 mg via ORAL
  Filled 2021-09-13 (×4): qty 1

## 2021-09-13 MED ORDER — TAMSULOSIN HCL 0.4 MG PO CAPS
0.4000 mg | ORAL_CAPSULE | Freq: Two times a day (BID) | ORAL | Status: DC
  Administered 2021-09-13 – 2021-09-16 (×7): 0.4 mg via ORAL
  Filled 2021-09-13 (×7): qty 1

## 2021-09-13 MED ORDER — ACETAMINOPHEN 500 MG PO TABS
500.0000 mg | ORAL_TABLET | ORAL | Status: DC | PRN
  Administered 2021-09-13: 500 mg via ORAL
  Filled 2021-09-13: qty 1

## 2021-09-13 MED ORDER — PANTOPRAZOLE SODIUM 40 MG PO TBEC
40.0000 mg | DELAYED_RELEASE_TABLET | Freq: Every day | ORAL | Status: DC
  Administered 2021-09-13 – 2021-09-16 (×4): 40 mg via ORAL
  Filled 2021-09-13 (×4): qty 1

## 2021-09-13 MED ORDER — EZETIMIBE 10 MG PO TABS
10.0000 mg | ORAL_TABLET | Freq: Every day | ORAL | Status: DC
  Administered 2021-09-13 – 2021-09-16 (×4): 10 mg via ORAL
  Filled 2021-09-13 (×4): qty 1

## 2021-09-13 MED ORDER — ASPIRIN EC 81 MG PO TBEC
81.0000 mg | DELAYED_RELEASE_TABLET | Freq: Every day | ORAL | Status: DC
  Administered 2021-09-13 – 2021-09-16 (×4): 81 mg via ORAL
  Filled 2021-09-13 (×4): qty 1

## 2021-09-13 MED ORDER — MONTELUKAST SODIUM 10 MG PO TABS
10.0000 mg | ORAL_TABLET | Freq: Every day | ORAL | Status: DC
  Administered 2021-09-13 – 2021-09-15 (×4): 10 mg via ORAL
  Filled 2021-09-13 (×4): qty 1

## 2021-09-13 MED ORDER — UMECLIDINIUM BROMIDE 62.5 MCG/ACT IN AEPB
1.0000 | INHALATION_SPRAY | Freq: Every day | RESPIRATORY_TRACT | Status: DC
  Administered 2021-09-13 – 2021-09-15 (×3): 1 via RESPIRATORY_TRACT
  Filled 2021-09-13: qty 7

## 2021-09-13 MED ORDER — HEPARIN BOLUS VIA INFUSION
2300.0000 [IU] | Freq: Once | INTRAVENOUS | Status: AC
  Administered 2021-09-13: 2300 [IU] via INTRAVENOUS
  Filled 2021-09-13: qty 2300

## 2021-09-13 MED ORDER — HEPARIN (PORCINE) 25000 UT/250ML-% IV SOLN
1700.0000 [IU]/h | INTRAVENOUS | Status: DC
  Administered 2021-09-13: 18:00:00 1150 [IU]/h via INTRAVENOUS
  Administered 2021-09-13: 900 [IU]/h via INTRAVENOUS
  Administered 2021-09-14: 1550 [IU]/h via INTRAVENOUS
  Administered 2021-09-15: 1700 [IU]/h via INTRAVENOUS
  Filled 2021-09-13 (×4): qty 250

## 2021-09-13 MED ORDER — HEPARIN BOLUS VIA INFUSION
2300.0000 [IU] | Freq: Once | INTRAVENOUS | Status: AC
Start: 2021-09-13 — End: 2021-09-13
  Administered 2021-09-13: 2300 [IU] via INTRAVENOUS
  Filled 2021-09-13: qty 2300

## 2021-09-13 MED ORDER — METOPROLOL TARTRATE 50 MG PO TABS
50.0000 mg | ORAL_TABLET | Freq: Once | ORAL | Status: AC
  Administered 2021-09-13: 50 mg via ORAL
  Filled 2021-09-13: qty 1

## 2021-09-13 MED ORDER — NITROGLYCERIN 0.4 MG SL SUBL
SUBLINGUAL_TABLET | SUBLINGUAL | Status: AC
  Filled 2021-09-13: qty 2

## 2021-09-13 MED ORDER — ALBUTEROL SULFATE (2.5 MG/3ML) 0.083% IN NEBU: 3.0000 mL | INHALATION_SOLUTION | Freq: Four times a day (QID) | RESPIRATORY_TRACT | Status: DC | PRN

## 2021-09-13 MED ORDER — FLUTICASONE-UMECLIDIN-VILANT 100-62.5-25 MCG/ACT IN AEPB: INHALATION_SPRAY | Freq: Every day | RESPIRATORY_TRACT | Status: DC

## 2021-09-13 MED ORDER — FINASTERIDE 5 MG PO TABS
10.0000 mg | ORAL_TABLET | Freq: Every day | ORAL | Status: DC
  Administered 2021-09-13 – 2021-09-16 (×4): 10 mg via ORAL
  Filled 2021-09-13 (×4): qty 2

## 2021-09-13 MED ORDER — IOHEXOL 350 MG/ML SOLN
100.0000 mL | Freq: Once | INTRAVENOUS | Status: AC | PRN
  Administered 2021-09-13: 100 mL via INTRAVENOUS

## 2021-09-13 NOTE — Progress Notes (Signed)
ANTICOAGULATION CONSULT NOTE - Follow Up Consult  Pharmacy Consult for IV Heparin Indication: chest pain/ACS  Allergies  Allergen Reactions   Statins Other (See Comments)    CLASS EFFECT SEVERE LEG CRAMPS [MULTIPLE STATINS]   Repatha [Evolocumab]     Flu like symptoms    Patient Measurements: Height: 5\' 11"  (180.3 cm) Weight: 77.1 kg (169 lb 14.4 oz) IBW/kg (Calculated) : 75.3 Heparin Dosing Weight: 77.1 kg  Vital Signs: Temp: 97.9 F (36.6 C) (12/31 0813) Temp Source: Oral (12/31 0813) BP: 118/57 (12/31 0813) Pulse Rate: 87 (12/31 1015)  Labs: Recent Labs    09/13/21 0100 09/13/21 0129 09/13/21 0154 09/13/21 0937  HGB 8.5* 8.5*  --   --   HCT 28.5* 27.9*  --   --   PLT 364 352  --   --   APTT 33  --   --   --   HEPARINUNFRC  --   --   --  <0.10*  CREATININE 1.48*  --   --   --   TROPONINIHS 7  --  7  --     Estimated Creatinine Clearance: 35.3 mL/min (A) (by C-G formula based on SCr of 1.48 mg/dL (H)).  Assessment: 89 YOM presented with chest pain transferred from OSH. Patient with significant cardiac history including CAD, prior STEMI in 2001, CABG x 4. Pharmacy to dose IV heparin.   Heparin level undetectable on 8 hour check. Platelets are stable and within normal limits. Hgb at 8.5. No signs of bleeding noted. Discussed with RN and no IV site issues noted.   Goal of Therapy:  Heparin level 0.3-0.7 units/ml Monitor platelets by anticoagulation protocol: Yes   Plan:  IV Heparin 2300 unit bolus x 1 Increase IV heparin gtt to 1150 units/h 8 hour Heparin level Daily heparin level, CBC Monitor for signs/symptoms of bleeding  Thank you for involving pharmacy in this patient's care.  Elita Quick, PharmD PGY1 Ambulatory Care Pharmacy Resident 09/13/2021 11:08 AM  **Pharmacist phone directory can be found on [email or web address] listed under [place]**

## 2021-09-13 NOTE — Plan of Care (Signed)
  Problem: Education: Goal: Understanding of cardiac disease, CV risk reduction, and recovery process will improve Outcome: Progressing   Problem: Cardiac: Goal: Ability to achieve and maintain adequate cardiovascular perfusion will improve Outcome: Progressing   

## 2021-09-13 NOTE — Progress Notes (Signed)
ANTICOAGULATION CONSULT NOTE - Follow Up Consult  Pharmacy Consult for IV Heparin Indication: chest pain/ACS  Allergies  Allergen Reactions   Statins Other (See Comments)    CLASS EFFECT SEVERE LEG CRAMPS [MULTIPLE STATINS]   Repatha [Evolocumab]     Flu like symptoms    Patient Measurements: Height: 5\' 11"  (180.3 cm) Weight: 77.1 kg (169 lb 14.4 oz) IBW/kg (Calculated) : 75.3 Heparin Dosing Weight: 77.1 kg  Vital Signs: Temp: 98 F (36.7 C) (12/31 1648) Temp Source: Oral (12/31 1648) BP: 101/56 (12/31 1648) Pulse Rate: 71 (12/31 1648)  Labs: Recent Labs    09/13/21 0100 09/13/21 0129 09/13/21 0154 09/13/21 0937 09/13/21 1803  HGB 8.5* 8.5*  --   --   --   HCT 28.5* 27.9*  --   --   --   PLT 364 352  --   --   --   APTT 33  --   --   --   --   HEPARINUNFRC  --   --   --  <0.10* <0.10*  CREATININE 1.48*  --   --   --   --   TROPONINIHS 7  --  7  --   --      Estimated Creatinine Clearance: 35.3 mL/min (A) (by C-G formula based on SCr of 1.48 mg/dL (H)).  Assessment: 89 YOM presented with chest pain transferred from OSH. Patient with significant cardiac history including CAD, prior STEMI in 2001, CABG x 4. Pharmacy to dose IV heparin.   Heparin level remains undetectable on 8 hour check. After heparin bolus given and heparin drip rate was increased to 1150 units/h this morning.   No signs of bleeding noted, IV site  looks normal and no bleeding noted per RN's report.   Goal of Therapy:  Heparin level 0.3-0.7 units/ml Monitor platelets by anticoagulation protocol: Yes   Plan:  Give bolus heparin 2300 unit IV x 1 Increase IV heparin gtt to 1400 units/h Check 8 hour Heparin level Daily heparin level, CBC Monitor for signs/symptoms of bleeding  Thank you for involving pharmacy in this patient's care. Nicole Cella, RPh Clinical Pharmacist  09/13/2021 7:12 PM  **Pharmacist phone directory can be found on [email or web address] listed under [place]**

## 2021-09-13 NOTE — Progress Notes (Signed)
ANTICOAGULATION CONSULT NOTE - Initial Consult  Pharmacy Consult for Heparin  Indication: chest pain/ACS  Allergies  Allergen Reactions   Statins Other (See Comments)    CLASS EFFECT SEVERE LEG CRAMPS [MULTIPLE STATINS]   Repatha [Evolocumab]     Flu like symptoms    Patient Measurements: Height: 5\' 11"  (180.3 cm) Weight: 77.1 kg (169 lb 14.4 oz) IBW/kg (Calculated) : 75.3  Vital Signs: Temp: 98 F (36.7 C) (12/31 0003) Temp Source: Oral (12/31 0003) BP: 108/57 (12/31 0003) Pulse Rate: 93 (12/31 0003)  Labs: Recent Labs    09/13/21 0100  HGB 8.5*  HCT 28.5*  PLT 364    CrCl cannot be calculated (Patient's most recent lab result is older than the maximum 21 days allowed.).   Medical History: Past Medical History:  Diagnosis Date   AAA (abdominal aortic aneurysm)    3.2 cm 07/2016; 2-3 year f/u recommended   Asthma    BPH (benign prostatic hypertrophy)    CAD (coronary artery disease)    9 STEMI 2001, PCI, RCA, residual 100% circumflex  /   PCI for in-stent restenosis June, 2001  /  nuclear February, 2010, no ischemia   Cancer [facility])    skin cancer removed   Carotid artery disease ([place])    Doppler, June, 2011, 0-39% bilateral, pt denies   Cervical disc disease    Status post neurosurgery   Chest pain 09/13/2021   Colon polyps    Ejection fraction    EF 55-65%, echo, 2009   Elevated CPK    Chronic mild CPK elevation   Emphysema    Mild   Fatigue    Morning fatigue, August, 2011   GERD (gastroesophageal reflux disease)    Headache    back of head   Hyperlipidemia    Hypertension    Low back pain    February, 2013   Myocardial infarction [facility])    Paralyzed hemidiaphragm    Right   Paralyzed hemidiaphragm    Chronic   Statin intolerance    Elevated CPK in the past     Assessment: 87 y/o M with cardiac history including CABG a few years ago present to the ED with progressive chest pain. Starting heparin per pharmacy. Hgb 8.5. PTA meds reviewed.    Goal of Therapy:  Heparin level 0.3-0.7 units/ml Monitor platelets by anticoagulation protocol: Yes   Plan:  Heparin 4000 units BOLUS Start heparin drip at 900 units/hr 1000 Heparin level Daily CBC/Heparin level Monitor for bleeding  Narda Bonds, PharmD, BCPS Clinical Pharmacist Phone: [phone]

## 2021-09-13 NOTE — Progress Notes (Signed)
Primary Cardiologist:  Marlou Porch   Subjective:  Denies SSCP, palpitations or Dyspnea   Objective:  Vitals:   09/13/21 0003 09/13/21 0438 09/13/21 0813 09/13/21 0908  BP: (!) 108/57 (!) 111/56 (!) 118/57   Pulse: 93 82 87   Resp: 20 18 19    Temp: 98 F (36.7 C) 97.7 F (36.5 C) 97.9 F (36.6 C)   TempSrc: Oral Oral Oral   SpO2: 99% 98% 98% 99%  Weight: 77.1 kg     Height: 5\' 11"  (1.803 m)       Intake/Output from previous day:  Intake/Output Summary (Last 24 hours) at 09/13/2021 0929 Last data filed at 09/13/2021 0303 Gross per 24 hour  Intake 415.87 ml  Output --  Net 415.87 ml    Physical Exam: Elderly male No bruit SEM  Lungs clear Post sternotomy  No edema  Palpable DP bilaterally   Lab Results: Basic Metabolic Panel: Recent Labs    09/13/21 0100  NA 136  K 3.5  CL 103  CO2 24  GLUCOSE 137*  BUN 29*  CREATININE 1.48*  CALCIUM 8.5*   Liver Function Tests: Recent Labs    09/13/21 0100  AST 14*  ALT 11  ALKPHOS 78  BILITOT 0.5  PROT 6.5  ALBUMIN 3.1*   No results for input(s): LIPASE, AMYLASE in the last 72 hours. CBC: Recent Labs    09/13/21 0100 09/13/21 0129  WBC 7.4 8.0  NEUTROABS 5.2  --   HGB 8.5* 8.5*  HCT 28.5* 27.9*  MCV 77.2* 76.4*  PLT 364 352    Hemoglobin A1C: Recent Labs    09/13/21 0100  HGBA1C 7.1*   Fasting Lipid Panel: No results for input(s): CHOL, HDL, LDLCALC, TRIG, CHOLHDL, LDLDIRECT in the last 72 hours. Thyroid Function Tests: Recent Labs    09/13/21 0100  TSH 3.893   Anemia Panel: No results for input(s): VITAMINB12, FOLATE, FERRITIN, TIBC, IRON, RETICCTPCT in the last 72 hours.  Imaging: No results found.  Cardiac Studies:  ECG: SR old IMI PVC no acute changes    Telemetry:  NSR 09/13/2021   Echo: pending  Medications:    aspirin EC  81 mg Oral Daily   diltiazem  180 mg Oral Daily   ezetimibe  10 mg Oral Daily   finasteride  10 mg Oral Daily   fluticasone furoate-vilanterol  1  puff Inhalation Daily   metoprolol tartrate  50 mg Oral Once   montelukast  10 mg Oral QHS   pantoprazole  40 mg Oral Daily   sodium chloride flush  3 mL Intravenous Q12H   tamsulosin  0.4 mg Oral BID   umeclidinium bromide  1 puff Inhalation Daily      sodium chloride 75 mL/hr at 09/13/21 0727   heparin 900 Units/hr (09/13/21 0134)    Assessment/Plan:   Tim [name] is a 87 y.o. male with bilateral carotid artery disease, CAD with prior STEMI in 2001 with PCI to RCA, residual 100% circumflex with subsequent PCI for in-stent restenosis in June 2001 who then underwent CABG x4 by Dr. Servando Snare in 08/2017-LIMA to LAD, SVG to diagonal, SVG to left circumflex, SVG to PDA.  Has chronic localized pericardial effusion3 who is being seen 09/13/2021 for the evaluation of chest pain. He was transferred from [facility] hospital  Chest Pain:  etiology unclear have ordered a gated cardiac CTA to done retrospectively to further see if outside hospital suspiscion for SVG aneurysm correct May need heart cath on Tuesday since  Monday is holiday Continue ASA, heparin Echo pending as well He is stable with no pain this am Troponin negative and no acute ECG chagnes  HLD:  continue zetia HTN:  continue cardizem   Jenkins Rouge 09/13/2021, 9:29 AM

## 2021-09-13 NOTE — H&P (Signed)
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 male with bilateral carotid artery disease, CAD with prior STEMI in 2001 with PCI to RCA, residual 100% circumflex with subsequent PCI for in-stent restenosis in June 2001 who then underwent CABG x4 by Dr. Servando Snare in 08/2017-LIMA to LAD, SVG to diagonal, SVG to left circumflex, SVG to PDA.  Has chronic localized pericardial effusion3 who is being seen 09/13/2021 for the evaluation of chest pain. He was transferred from [facility] hospital  History of Present Illness:  Tim [name] is a 87 y.o. male with bilateral carotid artery disease, CAD with prior STEMI in 2001 with PCI to RCA, residual 100% circumflex with subsequent PCI for in-stent restenosis in June 2001 who then underwent CABG x4 by Dr. Servando Snare in 08/2017-LIMA to LAD, SVG to diagonal, SVG to left circumflex, SVG to PDA.  Has chronic localized pericardial effusion3 who is being seen 09/13/2021 for the evaluation of chest pain. 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  Per patient, he has been having off and on chest pain for a few weeks now, sudden onset substernal chest pain- like a shocking sensation. Family at bedside reports last week he had chest tightness and today his chest pain lasted >82mins radiating to the left arm and resolved on its own. No exertional symptoms or other associated symptoms. Denies orthopnea, pnd but does have some dyspnea on  exertion and left LE edema>right. Denies any URTIs, palpitations.  OSH EKG: NSR, prior anteroseptal q waves, no ischemic changes Trop < 0.034 (negative) Cr 1.30  CT chest FINDINGS:   Cardiovascular:   No pulmonary embolus.   Partially fluid density partially solid 11 x 8 x 4 cm multilobulated mass (utilizing double oblique MPR technique) contiguous with left lateral wall of the heart the left atrium and left ventricle anteriorly and to the right. A patent saphenous vein graft courses directly into this area of interest (see double oblique MPR images).   Patent LIMA and saphenous to right grafts are also seen.   Aortic valve leaflet thickening and calcification.   Ancillary Findings:   Mild mediastinal adenopathy. Moderate emphysema and mild fibrosis. Degenerative disease of the spine. Renal and pancreatic atrophy. Cholelithiasis.  Past Medical History:  Diagnosis Date   AAA (abdominal aortic aneurysm)    3.2 cm 07/2016; 2-3 year f/u recommended   Asthma    BPH (benign prostatic hypertrophy)    CAD (coronary artery disease)    9 STEMI 2001, PCI, RCA, residual 100% circumflex  /   PCI for in-stent restenosis June, 2001  /  nuclear February, 2010, no ischemia   Cancer [facility])    skin cancer removed   Carotid artery disease ([place])    Doppler, June, 2011, 0-39% bilateral, pt denies   Cervical disc disease    Status post neurosurgery   Chest pain 09/13/2021   Colon polyps    Ejection fraction    EF 55-65%, echo, 2009   Elevated CPK  Chronic mild CPK elevation   Emphysema    Mild   Fatigue    Morning fatigue, August, 2011   GERD (gastroesophageal reflux disease)    Headache    back of head   Hyperlipidemia    Hypertension    Low back pain    February, 2013   Myocardial infarction [facility])    Paralyzed hemidiaphragm    Right   Paralyzed hemidiaphragm    Chronic   Statin intolerance    Elevated CPK in the past    Past Surgical History:  Procedure Laterality Date    BREAST SURGERY     right breast removed   CERVICAL DISC SURGERY     CORONARY ARTERY BYPASS GRAFT N/A 08/24/2017   Procedure: CORONARY ARTERY BYPASS GRAFTING times four using left internal mammary artery and bilateral saphenous vein, using endoscope. TEE;  Surgeon: Grace Isaac, MD;  Location: [place];  Service: Open Heart Surgery;  Laterality: N/A;   LEFT HEART CATH AND CORONARY ANGIOGRAPHY N/A 07/29/2017   Procedure: LEFT HEART CATH AND CORONARY ANGIOGRAPHY;  Surgeon: Nelva Bush, MD;  Location: [place] [facility];  Service: Cardiovascular;  Laterality: N/A;   SKIN CANCER EXCISION     TEE WITHOUT CARDIOVERSION N/A 08/24/2017   Procedure: TRANSESOPHAGEAL ECHOCARDIOGRAM (TEE);  Surgeon: Grace Isaac, MD;  Location: [place];  Service: Open Heart Surgery;  Laterality: N/A;     Medications Prior to Admission: Prior to Admission medications   Medication Sig Start Date End Date Taking? Authorizing Provider  acetaminophen (TYLENOL) 500 MG tablet Take 500 mg by mouth every 4 (four) hours as needed for mild pain.    [provider]  albuterol (PROVENTIL HFA;VENTOLIN HFA) 108 (90 BASE) MCG/ACT inhaler Inhale 2 puffs into the lungs every 6 (six) hours as needed for wheezing or shortness of breath. 08/13/15   Juanito Doom, MD  aspirin EC 81 MG tablet Take 81 mg by mouth daily.    [provider]  BESIVANCE 0.6 % SUSP Apply to eye every 30 (thirty) days. Drops in left eye 09/17/20   [provider]  diltiazem (CARDIZEM CD) 180 MG 24 hr capsule Take 1 capsule (180 mg total) by mouth daily. 08/29/21   Jerline Pain, MD  doxycycline (VIBRAMYCIN) 50 MG capsule Take 50 mg by mouth daily.  08/09/18   [provider]  ezetimibe (ZETIA) 10 MG tablet Take 1 tablet (10 mg total) by mouth daily. 05/27/21 08/25/21  Isaiah Serge, NP  finasteride (PROSCAR) 5 MG tablet Take 10 mg by mouth daily.  06/03/11   [provider]  furosemide (LASIX) 40 MG tablet  TAKE 1 TABLET (40 MG TOTAL) BY MOUTH DAILY AS NEEDED FOR FLUID. 05/29/20   Isaiah Serge, NP  KLOR-CON M20 20 MEQ tablet TAKE 1 TABLET BY MOUTH EVERY DAY 12/11/20   Jerline Pain, MD  montelukast (SINGULAIR) 10 MG tablet TAKE 1 TABLET BY MOUTH EVERYDAY AT BEDTIME 08/16/20   Tanda Rockers, MD  omeprazole (PRILOSEC) 40 MG capsule TAKE 1 CAPSULE BY MOUTH TWICE A DAY 10/21/20   Tanda Rockers, MD  sodium chloride HYPERTONIC 3 % nebulizer solution Take 4 mLs by nebulization as needed for other or cough.    [provider]  tamsulosin (FLOMAX) 0.4 MG CAPS capsule Take 0.4 mg by mouth 2 (two) times daily. For urinary symptoms    [provider]  TRELEGY ELLIPTA 100-62.5-25 MCG/INH AEPB TAKE 1 PUFF BY MOUTH EVERY DAY  10/25/20   Tanda Rockers, MD     Allergies:    Allergies  Allergen Reactions   Statins Other (See Comments)    CLASS EFFECT SEVERE LEG CRAMPS [MULTIPLE STATINS]   Repatha [Evolocumab]     Flu like symptoms    Social History:   Social History   Socioeconomic History   Marital status: Married    Spouse name: Not on file   Number of children: Not on file   Years of education: Not on file   Highest education level: Not on file  Occupational History   Occupation: retired Games developer  Tobacco Use   Smoking status: Former    Packs/day: 3.00    Years: 40.00    Pack years: 120.00    Types: Cigarettes    Quit date: 09/15/1975    Years since quitting: 46.0   Smokeless tobacco: Never  Vaping Use   Vaping Use: Never used  Substance and Sexual Activity   Alcohol use: No   Drug use: No   Sexual activity: Not on file  Other Topics Concern   Not on file  Social History Narrative   Not on file   Social Determinants of Health   Financial Resource Strain: Not on file  Food Insecurity: Not on file  Transportation Needs: Not on file  Physical Activity: Not on file  Stress: Not on file  Social Connections: Not on file  Intimate Partner Violence: Not on file     Family History:   The patient's family history includes Cancer in his brother; Diabetes in his mother and sister; Kidney disease in his brother; Other in his father and sister.    ROS:  Please see the history of present illness.  All other ROS reviewed and negative.     Physical Exam/Data:   Vitals:   09/13/21 0003  BP: (!) 108/57  Pulse: 93  Resp: 20  Temp: 98 F (36.7 C)  TempSrc: Oral  SpO2: 99%  Weight: 77.1 kg  Height: 5\' 11"  (1.803 m)   No intake or output data in the 24 hours ending 09/13/21 0104 Last 3 Weights 09/13/2021 01/28/2021 10/22/2020  Weight (lbs) 169 lb 14.4 oz 182 lb 182 lb  Weight (kg) 77.066 kg 82.555 kg 82.555 kg     Body mass index is 23.7 kg/m.  General:  Well nourished, well developed, in no acute distress HEENT: normal Neck: no JVD Vascular: No carotid bruits; Distal pulses 2+ bilaterally   Cardiac:  normal S1, S2; RRR; no murmur  Lungs:  clear to auscultation bilaterally, no wheezing, rhonchi or rales  Abd: soft, nontender, no hepatomegaly  Ext: no edema Musculoskeletal:  No deformities, BUE and BLE strength normal and equal Skin: warm and dry  Neuro:  CNs 2-12 intact, no focal abnormalities noted Psych:  Normal affect    EKG:  The ECG that was done  was personally reviewed and demonstrates NSR    Laboratory Data:  High Sensitivity Troponin:  No results for input(s): TROPONINIHS in the last 720 hours.    ChemistryNo results for input(s): NA, K, CL, CO2, GLUCOSE, BUN, CREATININE, CALCIUM, MG, GFRNONAA, GFRAA, ANIONGAP in the last 168 hours.  No results for input(s): PROT, ALBUMIN, AST, ALT, ALKPHOS, BILITOT in the last 168 hours. Lipids No results for input(s): CHOL, TRIG, HDL, LABVLDL, LDLCALC, CHOLHDL in the last 168 hours. HematologyNo results for input(s): WBC, RBC, HGB, HCT, MCV, MCH, MCHC, RDW, PLT in the last 168 hours. Thyroid No results for input(s): TSH,  FREET4 in the last 168 hours. BNPNo results for input(s): BNP, PROBNP in  the last 168 hours.  DDimer No results for input(s): DDIMER in the last 168 hours.   Radiology/Studies:  No results found.  Relevant CV Studies: As noted above  ECHO: 2019 Study Conclusions   - Left ventricle: The cavity size was normal. Wall thickness was    normal. Systolic function was normal. The estimated ejection    fraction was in the range of 60% to 65%. Doppler parameters are    consistent with both elevated ventricular end-diastolic filling    pressure and elevated left atrial filling pressure.  - Left atrium: The atrium was mildly dilated.  - Atrial septum: No defect or patent foramen ovale was identified.  - Pericardium, extracardiac: Moderate localized posterior lateral    pericardial effusion no tamponade.   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 Servando Snare in 08/2017-LIMA to LAD, SVG to diagonal, SVG to left circumflex, SVG to PDA.  Chronic pericardial effusion H/o right paralyzed hemi diaphragm Former smoker B/l carotid dx  Plan: - admission to cardiology team - repeat labs, ekg, trops  - given worsening progressive chest pain with typical symptoms today- we will empirically heparinize gtt.  - obtain ECHO in am - get ECG gated cardiac CT to evaluate for possible SVG aneurysm in am. Push images from outside hospital if possible -  continue home medications: aspirin 81mg , on diltiazem 180mg  daily, ezetimibe 10mg  daily, intolerant to statins (myalgias). Check lipid panel, A1c  - likely will need LHC/graft angiography on Monday pending cardiac CT findings and further discussion with the heart team (Interventional and CTSx) if indeed confirms huge SVG aneurysm. Full code  Risk Assessment/Risk Scores:    HEAR Score (for undifferentiated chest pain):  HEAR Score: 5       Severity of Illness: The  appropriate patient status for this patient is INPATIENT. Inpatient status is judged to be reasonable and necessary in order to provide the required intensity of service to ensure the patient's safety. The patient's presenting symptoms, physical exam findings, and initial radiographic and laboratory data in the context of their chronic comorbidities is felt to place them at high risk for further clinical deterioration. Furthermore, it is not anticipated that the patient will be medically stable for discharge from the hospital within 2 midnights of admission.   * I certify that at the point of admission it is my clinical judgment that the patient will require inpatient hospital care spanning beyond 2 midnights from the point of admission due to high intensity of service, high risk for further deterioration and high frequency of surveillance required.*   For questions or updates, please contact [place] Please consult [email or web address] for contact info under     Signed, Renae Fickle, MD  09/13/2021 1:04 AM

## 2021-09-14 ENCOUNTER — Inpatient Hospital Stay (HOSPITAL_COMMUNITY): Payer: Medicare Other

## 2021-09-14 DIAGNOSIS — R079 Chest pain, unspecified: Secondary | ICD-10-CM | POA: Diagnosis not present

## 2021-09-14 DIAGNOSIS — R9431 Abnormal electrocardiogram [ECG] [EKG]: Secondary | ICD-10-CM

## 2021-09-14 LAB — ECHOCARDIOGRAM COMPLETE
AR max vel: 3.83 cm2
AV Area VTI: 3.76 cm2
AV Area mean vel: 3.67 cm2
AV Mean grad: 3 mmHg
AV Peak grad: 5.5 mmHg
Ao pk vel: 1.17 m/s
Area-P 1/2: 3.68 cm2
Height: 71 in
Weight: 2718.4 oz

## 2021-09-14 LAB — CBC
HCT: 25.5 % — ABNORMAL LOW (ref 39.0–52.0)
Hemoglobin: 7.6 g/dL — ABNORMAL LOW (ref 13.0–17.0)
MCH: 23.2 pg — ABNORMAL LOW (ref 26.0–34.0)
MCHC: 29.8 g/dL — ABNORMAL LOW (ref 30.0–36.0)
MCV: 77.7 fL — ABNORMAL LOW (ref 80.0–100.0)
Platelets: 324 10*3/uL (ref 150–400)
RBC: 3.28 MIL/uL — ABNORMAL LOW (ref 4.22–5.81)
RDW: 18.7 % — ABNORMAL HIGH (ref 11.5–15.5)
WBC: 5.2 10*3/uL (ref 4.0–10.5)
nRBC: 0 % (ref 0.0–0.2)

## 2021-09-14 LAB — HEPARIN LEVEL (UNFRACTIONATED)
Heparin Unfractionated: 0.21 IU/mL — ABNORMAL LOW (ref 0.30–0.70)
Heparin Unfractionated: 0.29 IU/mL — ABNORMAL LOW (ref 0.30–0.70)

## 2021-09-14 NOTE — Progress Notes (Signed)
°  Echocardiogram 2D Echocardiogram has been performed.  Merrie Roof F 09/14/2021, 9:39 AM

## 2021-09-14 NOTE — Progress Notes (Signed)
ANTICOAGULATION CONSULT NOTE   Pharmacy Consult for Heparin  Indication: chest pain/ACS  Allergies  Allergen Reactions   Statins Other (See Comments)    CLASS EFFECT SEVERE LEG CRAMPS [MULTIPLE STATINS]   Repatha [Evolocumab]     Flu like symptoms    Patient Measurements: Height: 5\' 11"  (180.3 cm) Weight: 77.1 kg (169 lb 14.4 oz) IBW/kg (Calculated) : 75.3  Vital Signs: Temp: 98 F (36.7 C) (01/01 0328) Temp Source: Oral (01/01 0328) BP: 93/48 (01/01 0328) Pulse Rate: 62 (01/01 0328)  Labs: Recent Labs    09/13/21 0100 09/13/21 0129 09/13/21 0154 09/13/21 0937 09/13/21 1803 09/14/21 0503  HGB 8.5* 8.5*  --   --   --  7.6*  HCT 28.5* 27.9*  --   --   --  25.5*  PLT 364 352  --   --   --  324  APTT 33  --   --   --   --   --   HEPARINUNFRC  --   --   --  <0.10* <0.10* 0.21*  CREATININE 1.48*  --   --   --   --   --   TROPONINIHS 7  --  7  --   --   --      Estimated Creatinine Clearance: 35.3 mL/min (A) (by C-G formula based on SCr of 1.48 mg/dL (H)).   Medical History: Past Medical History:  Diagnosis Date   AAA (abdominal aortic aneurysm)    3.2 cm 07/2016; 2-3 year f/u recommended   Asthma    BPH (benign prostatic hypertrophy)    CAD (coronary artery disease)    9 STEMI 2001, PCI, RCA, residual 100% circumflex  /   PCI for in-stent restenosis June, 2001  /  nuclear February, 2010, no ischemia   Cancer [facility])    skin cancer removed   Carotid artery disease ([place])    Doppler, June, 2011, 0-39% bilateral, pt denies   Cervical disc disease    Status post neurosurgery   Chest pain 09/13/2021   Colon polyps    Ejection fraction    EF 55-65%, echo, 2009   Elevated CPK    Chronic mild CPK elevation   Emphysema    Mild   Fatigue    Morning fatigue, August, 2011   GERD (gastroesophageal reflux disease)    Headache    back of head   Hyperlipidemia    Hypertension    Low back pain    February, 2013   Myocardial infarction [facility])    Paralyzed  hemidiaphragm    Right   Paralyzed hemidiaphragm    Chronic   Statin intolerance    Elevated CPK in the past     Assessment: 88 y/o M with cardiac history including CABG a few years ago present to the ED with progressive chest pain. Starting heparin per pharmacy. Hgb 8.5. PTA meds reviewed.   1/1 AM update:  Heparin level low but trending up Hgb down some-watch  Goal of Therapy:  Heparin level 0.3-0.7 units/ml Monitor platelets by anticoagulation protocol: Yes   Plan:  Inc heparin to 1550 units/hr 1400 heparin level Trend Hgb  Narda Bonds, PharmD, BCPS Clinical Pharmacist Phone: [phone]

## 2021-09-14 NOTE — Progress Notes (Signed)
Primary Cardiologist:  Marlou Porch   Subjective:  Denies SSCP, palpitations or Dyspnea   Objective:  Vitals:   09/13/21 1648 09/13/21 2004 09/13/21 2240 09/14/21 0328  BP: (!) 101/56 (!) 106/53 (!) 99/46 (!) 93/48  Pulse: 71 68 73 62  Resp: 14 20  18   Temp: 98 F (36.7 C) 98 F (36.7 C)  98 F (36.7 C)  TempSrc: Oral Oral  Oral  SpO2: 93% 97%  97%  Weight:      Height:        Intake/Output from previous day:  Intake/Output Summary (Last 24 hours) at 09/14/2021 0856 Last data filed at 09/14/2021 0645 Gross per 24 hour  Intake 875.58 ml  Output 800 ml  Net 75.58 ml    Physical Exam: Elderly male No bruit SEM  Lungs clear Post sternotomy  No edema  Palpable DP bilaterally   Lab Results: Basic Metabolic Panel: Recent Labs    09/13/21 0100  NA 136  K 3.5  CL 103  CO2 24  GLUCOSE 137*  BUN 29*  CREATININE 1.48*  CALCIUM 8.5*   Liver Function Tests: Recent Labs    09/13/21 0100  AST 14*  ALT 11  ALKPHOS 78  BILITOT 0.5  PROT 6.5  ALBUMIN 3.1*   No results for input(s): LIPASE, AMYLASE in the last 72 hours. CBC: Recent Labs    09/13/21 0100 09/13/21 0129 09/14/21 0503  WBC 7.4 8.0 5.2  NEUTROABS 5.2  --   --   HGB 8.5* 8.5* 7.6*  HCT 28.5* 27.9* 25.5*  MCV 77.2* 76.4* 77.7*  PLT 364 352 324    Hemoglobin A1C: Recent Labs    09/13/21 0100  HGBA1C 7.1*   Fasting Lipid Panel: No results for input(s): CHOL, HDL, LDLCALC, TRIG, CHOLHDL, LDLDIRECT in the last 72 hours. Thyroid Function Tests: Recent Labs    09/13/21 0100  TSH 3.893   Anemia Panel: No results for input(s): VITAMINB12, FOLATE, FERRITIN, TIBC, IRON, RETICCTPCT in the last 72 hours.  Imaging: CT CORONARY MORPH W/CTA COR W/SCORE W/CA W/CM &/OR WO/CM  Addendum Date: 09/14/2021   ADDENDUM REPORT: 09/14/2021 07:22 CLINICAL DATA:  Chest pain Post CABG EXAM: Cardiac CTA MEDICATIONS: Sub lingual nitro. 4 mg and lopressor mg TECHNIQUE: The patient was scanned on a [employer] 192  scanner. Gantry rotation speed was 250 msecs. Collimation was. 6 mm . A 120 kV prospective scan was triggered in the ascending thoracic aorta at 140 HU's with full mA between 30-70% of the R-R interval . Average HR during the scan was bpm. The 3D data set was interpreted on a dedicated work station using MPR, MIP and VRT modes. A total of 80 cc of contrast was used. FINDINGS: Non-cardiac: See separate report from [facility]. No significant findings on limited lung and soft tissue windows. The patient is post CABG: The native RCA appears to have occluded stents in the mid vessel. There is severe LM/LAD disease with native vessel patient The native circumflex appears occluded. There are patent SVG;s to the RCA, OM and diagonal. The diagonal graft appears displaced by the para cardiac mass. The LIMA to the LAD is patent. Para cardiac mass posterior and lateral to LV measuring 6.2 cm x 2.3 cm on coronal view and 11.2 cm x 4.5 cm in axial view The mass impinges on the LA and posterior LV wall The overall EF appears preserved with inferior basal hypokinesis There does not appear to be direct communication between the SVG;s and the  mass There does not appear to be contrast enhancement of the mass On average the HU's range from 0-100 IMPRESSION: 1. Patent SVG;s to RCA, OM, D1 with no direct communication to para cardiac mass 2.  Patent LIMA to LAD 3. Large para cardiac mass see measurements above. Note this was not present prior to CABG so not likely to be complex pericardial cyst. Mass was present In 2019/2020 on non contrast CT scans Suspect old previous contained rupture from inferior base vs chronic loculated pericardial effusion. Consider f/u angiography Of grafts to r/o communication and cardiac MRI to further assess Jenkins Rouge Electronically Signed   By: Jenkins Rouge M.D.   On: 09/14/2021 07:22   Result Date: 09/14/2021 HISTORY: 88 yo male with chest pain/anginal equiv, intermediate CAD risk, treadmill  candidate EXAM: Cardiac/Coronary CTA TECHNIQUE: The patient was scanned on a [employer]. PROTOCOL: A 110 kV retrospective scan was triggered in the descending thoracic aorta at 111 HU's. Axial non-contrast 3 mm slices were carried out through the heart. The data set was analyzed on a dedicated work station and scored using the [place]. Gantry rotation speed was 250 msecs and collimation was .6 mm. Beta blockade and 0.8 mg of sl NTG was given. The 3D data set was reconstructed in 10% intervals of the 0-90 % of the R-R cycle. Diastolic phases were analyzed on a dedicated work station using MPR, MIP and VRT modes. The patient received 114mL OMNIPAQUE IOHEXOL 350 MG/ML SOLN of contrast. FINDINGS: Quality: Good, HR 70 Coronary calcium score: The patient's coronary artery calcium score is 1273, which places the patient in the 69th percentile (based on 88 yo) - RCA stent was excluded. Coronary arteries: Normal coronary origins. S/p coronary artery bypass grafting. Right dominance. Right Coronary Artery: Dominant. There is severe in-stent restenosis in the mid-vessel [facility]). The distal RCA is occluded (CADRADS5). The vessel is bypassed distally with patent SVG to PDA. Left Main Coronary Artery: Normal. Bifurcates into the LAD and LCx arteries. Left Anterior Descending Coronary Artery: Anterior vessel which reaches the apex. Heavy calcification with severe 70-99% proximal to mid-vessel stenosis (CADRADS4a). The vessel is bypassed distally by the LIMA. The anastomosis and flow distal to that appears patent. There is a moderate sized diagonal branch with severe 70-99% proximal mixed stenosis (CADRADS4). Left Circumflex Artery: Occluded proximally. This vessel is bypassed distally by SVG to OM1 which appears patent. Grafts: LIMA to LAD: Patent SVG to Diagonal: Patent SVG to PDA: Patent SVG to OM1: Patent Aorta: Normal size, 33 mm at the mid ascending aorta (level of the PA bifurcation) measured double  oblique. Aortic atherosclerosis. No dissection. Aortic Valve: Trileaflet.  Annular and leaflet calcifications. Other findings: Large heterogenous mass noted along the LV free wall, which appears to compress the LV cavity. This appears to be a semisolid structure of about 60 HU and could represent a pseudoaneurysm or loculated pericardial effusion. Normal pulmonary vein drainage into the left atrium. Normal left atrial appendage without a thrombus. Dilated main pulmonary artery to 30 mm, suggestive of pulmonary hypertension. LVEF 65% IMPRESSION: 1. 3 vessel native CAD with distally occluded RCA and proximal LCx arteries, CADRADS = 5. 2. S/p CABG x 4 with patent LIMA to LAD, SVG to D1, SVG to PDA and SVG to OM1. 3. Coronary calcium score of 1273. This was 69th percentile (based on 88 yo matched control) - the RCA stent was excluded. 4. Normal coronary origin with right dominance. 5. Large heterogenous mass noted along the LV  free wall, which appears to compress the LV cavity. This appears to be a semisolid structure of about 60 HU and could represent a pseudoaneurysm or loculated pericardial effusion. 6. Dilated main pulmonary artery to 30 mm, suggestive of pulmonary hypertension. 7. Aortic atherosclerosis and aortic valve/annular calcifications. 8. LVEF 65% 9. Consider cMRI to further delineate paraventricular mass Electronically Signed: By: Pixie Casino M.D. On: 09/13/2021 21:31    Cardiac Studies:  ECG: SR old IMI PVC no acute changes    Telemetry:  NSR 09/14/2021   Echo: pending  Medications:    aspirin EC  81 mg Oral Daily   diltiazem  180 mg Oral Daily   ezetimibe  10 mg Oral Daily   finasteride  10 mg Oral Daily   fluticasone furoate-vilanterol  1 puff Inhalation Daily   montelukast  10 mg Oral QHS   pantoprazole  40 mg Oral Daily   sodium chloride flush  3 mL Intravenous Q12H   tamsulosin  0.4 mg Oral BID   umeclidinium bromide  1 puff Inhalation Daily      heparin 1,550 Units/hr  (09/14/21 0622)    Assessment/Plan:   Tim [name] is a 88 y.o. male with bilateral carotid artery disease, CAD with prior STEMI in 2001 with PCI to RCA, residual 100% circumflex with subsequent PCI for in-stent restenosis in June 2001 who then underwent CABG x4 by Dr. Servando Snare in 08/2017-LIMA to LAD, SVG to diagonal, SVG to left circumflex, SVG to PDA.  Has chronic localized pericardial effusion3 who is being seen 09/13/2021 for the evaluation of chest pain. He was transferred from [facility] hospital  Chest Pain:  etiology unclear but does not appear to be acute coronary syndrome I reviewed his cardiac CTA from late yesterday. His SVG;s are patent as is his LIMA. There is no obvious SVG aneurysm. Note prior CT scans done post CABG 08/15/19, 03/01/20 and 09/02/20 also showed this para cardiac mass so it is not new. Cardiac MRI would be useful to further evaluate and will order for Tuesday Given quality of pain and good CTA don't think that heart cath is needed at this time Working diagnosis would be chronic loculated effusion vs old contained rupture of inferior basal wall  HTN:  continue cardizem   Jenkins Rouge 09/14/2021, 8:56 AM

## 2021-09-14 NOTE — Progress Notes (Signed)
ANTICOAGULATION CONSULT NOTE - Follow Up Consult  Pharmacy Consult for IV Heparin Indication: chest pain/ACS  Allergies  Allergen Reactions   Statins Other (See Comments)    CLASS EFFECT SEVERE LEG CRAMPS [MULTIPLE STATINS]   Repatha [Evolocumab]     Flu like symptoms    Patient Measurements: Height: 5\' 11"  (180.3 cm) Weight: 77.1 kg (169 lb 14.4 oz) IBW/kg (Calculated) : 75.3 Heparin Dosing Weight: 77.1 kg  Vital Signs: Temp: 96.5 F (35.8 C) (01/01 1354) Temp Source: Axillary (01/01 1354) BP: 125/57 (01/01 1354) Pulse Rate: 75 (01/01 1354)  Labs: Recent Labs    09/13/21 0100 09/13/21 0129 09/13/21 0154 09/13/21 0937 09/13/21 1803 09/14/21 0503 09/14/21 1344  HGB 8.5* 8.5*  --   --   --  7.6*  --   HCT 28.5* 27.9*  --   --   --  25.5*  --   PLT 364 352  --   --   --  324  --   APTT 33  --   --   --   --   --   --   HEPARINUNFRC  --   --   --    < > <0.10* 0.21* 0.29*  CREATININE 1.48*  --   --   --   --   --   --   TROPONINIHS 7  --  7  --   --   --   --    < > = values in this interval not displayed.     Estimated Creatinine Clearance: 35.3 mL/min (A) (by C-G formula based on SCr of 1.48 mg/dL (H)).  Assessment: Tim [name] presented with chest pain transferred from OSH. Patient with significant cardiac history including CAD, prior STEMI in 2001, CABG x 4. Pharmacy to dose IV heparin.   Heparin level 0.29 and almost at goal after rate increases. Since so close to goal, will defer on bolus. Platelets are stable and within normal limits. Hgb noted to be decreasing. No signs of bleeding noted. Discussed with RN and no IV site issues noted.   Goal of Therapy:  Heparin level 0.3-0.7 units/ml Monitor platelets by anticoagulation protocol: Yes   Plan:  Increase IV heparin gtt to 1700 units/h 8 hour Heparin level Daily heparin level, CBC Monitor for signs/symptoms of bleeding  Thank you for involving pharmacy in this patient's care.  Elita Quick,  PharmD PGY1 Ambulatory Care Pharmacy Resident 09/14/2021 3:16 PM  **Pharmacist phone directory can be found on [email or web address] listed under [email or web address]**

## 2021-09-15 ENCOUNTER — Inpatient Hospital Stay (HOSPITAL_COMMUNITY): Payer: Medicare Other

## 2021-09-15 DIAGNOSIS — D649 Anemia, unspecified: Secondary | ICD-10-CM

## 2021-09-15 DIAGNOSIS — I3139 Other pericardial effusion (noninflammatory): Secondary | ICD-10-CM

## 2021-09-15 DIAGNOSIS — I10 Essential (primary) hypertension: Secondary | ICD-10-CM

## 2021-09-15 DIAGNOSIS — I25118 Atherosclerotic heart disease of native coronary artery with other forms of angina pectoris: Secondary | ICD-10-CM | POA: Diagnosis not present

## 2021-09-15 LAB — CBC
HCT: 26.5 % — ABNORMAL LOW (ref 39.0–52.0)
Hemoglobin: 8.1 g/dL — ABNORMAL LOW (ref 13.0–17.0)
MCH: 23.4 pg — ABNORMAL LOW (ref 26.0–34.0)
MCHC: 30.6 g/dL (ref 30.0–36.0)
MCV: 76.6 fL — ABNORMAL LOW (ref 80.0–100.0)
Platelets: 327 10*3/uL (ref 150–400)
RBC: 3.46 MIL/uL — ABNORMAL LOW (ref 4.22–5.81)
RDW: 18.8 % — ABNORMAL HIGH (ref 11.5–15.5)
WBC: 5.3 10*3/uL (ref 4.0–10.5)
nRBC: 0 % (ref 0.0–0.2)

## 2021-09-15 LAB — HEPARIN LEVEL (UNFRACTIONATED): Heparin Unfractionated: 0.31 IU/mL (ref 0.30–0.70)

## 2021-09-15 MED ORDER — MELATONIN 3 MG PO TABS
6.0000 mg | ORAL_TABLET | Freq: Every day | ORAL | Status: DC
  Administered 2021-09-15 (×2): 6 mg via ORAL
  Filled 2021-09-15 (×2): qty 2

## 2021-09-15 MED ORDER — GADOBUTROL 1 MMOL/ML IV SOLN
10.0000 mL | Freq: Once | INTRAVENOUS | Status: AC | PRN
  Administered 2021-09-15: 10 mL via INTRAVENOUS

## 2021-09-15 NOTE — Progress Notes (Signed)
Progress Note  Patient Name: Tim [name] Date of Encounter: 09/15/2021  [facility] Cardiologist: Candee Furbish, MD   Subjective   No chest pain.  Feels well.  Inpatient Medications    Scheduled Meds:  aspirin EC  81 mg Oral Daily   diltiazem  180 mg Oral Daily   ezetimibe  10 mg Oral Daily   finasteride  10 mg Oral Daily   fluticasone furoate-vilanterol  1 puff Inhalation Daily   melatonin  6 mg Oral QHS   montelukast  10 mg Oral QHS   pantoprazole  40 mg Oral Daily   sodium chloride flush  3 mL Intravenous Q12H   tamsulosin  0.4 mg Oral BID   umeclidinium bromide  1 puff Inhalation Daily   Continuous Infusions:  heparin 1,700 Units/hr (09/15/21 0158)   PRN Meds: acetaminophen, albuterol, furosemide   Vital Signs    Vitals:   09/14/21 1354 09/14/21 2049 09/15/21 0408 09/15/21 0908  BP: (!) 125/57 109/65 (!) 116/57 130/65  Pulse: 75 71 71 91  Resp: 16 18 18 17   Temp: (!) 96.5 F (35.8 C) 98 F (36.7 C) 98.2 F (36.8 C) 98 F (36.7 C)  TempSrc: Axillary Oral Oral Oral  SpO2: 100% 93% 99% 97%  Weight:      Height:        Intake/Output Summary (Last 24 hours) at 09/15/2021 0921 Last data filed at 09/15/2021 0908 Gross per 24 hour  Intake 734.67 ml  Output 1420 ml  Net -685.33 ml   Last 3 Weights 09/13/2021 01/28/2021 10/22/2020  Weight (lbs) 169 lb 14.4 oz 182 lb 182 lb  Weight (kg) 77.066 kg 82.555 kg 82.555 kg      Telemetry    Normal sinus rhythm- Personally Reviewed  ECG      Physical Exam   GEN: No acute distress.  Hard of hearing Neck: No JVD Cardiac: RRR, no murmurs, rubs, or gallops.  Respiratory: Clear to auscultation bilaterally. GI: Soft, nontender, non-distended  MS: No edema; No deformity. Neuro:  Nonfocal  Psych: Normal affect   Labs    High Sensitivity Troponin:   Recent Labs  Lab 09/13/21 0100 09/13/21 0154  TROPONINIHS 7 7     Chemistry Recent Labs  Lab 09/13/21 0100  NA 136  K 3.5  CL 103  CO2 24   GLUCOSE 137*  BUN 29*  CREATININE 1.48*  CALCIUM 8.5*  PROT 6.5  ALBUMIN 3.1*  AST 14*  ALT 11  ALKPHOS 78  BILITOT 0.5  GFRNONAA 45*  ANIONGAP 9    Lipids No results for input(s): CHOL, TRIG, HDL, LABVLDL, LDLCALC, CHOLHDL in the last 168 hours.  Hematology Recent Labs  Lab 09/13/21 0129 09/14/21 0503 09/15/21 0017  WBC 8.0 5.2 5.3  RBC 3.65* 3.28* 3.46*  HGB 8.5* 7.6* 8.1*  HCT 27.9* 25.5* 26.5*  MCV 76.4* 77.7* 76.6*  MCH 23.3* 23.2* 23.4*  MCHC 30.5 29.8* 30.6  RDW 18.6* 18.7* 18.8*  PLT 352 324 327   Thyroid  Recent Labs  Lab 09/13/21 0100  TSH 3.893    BNPNo results for input(s): BNP, PROBNP in the last 168 hours.  DDimer No results for input(s): DDIMER in the last 168 hours.   Radiology    CT CORONARY MORPH W/CTA COR W/SCORE W/CA W/CM &/OR WO/CM  Addendum Date: 09/14/2021   ADDENDUM REPORT: 09/14/2021 07:22 CLINICAL DATA:  Chest pain Post CABG EXAM: Cardiac CTA MEDICATIONS: Sub lingual nitro. 4 mg and lopressor mg TECHNIQUE:  The patient was scanned on a [employer] 192 scanner. Gantry rotation speed was 250 msecs. Collimation was. 6 mm . A 120 kV prospective scan was triggered in the ascending thoracic aorta at 140 HU's with full mA between 30-70% of the R-R interval . Average HR during the scan was bpm. The 3D data set was interpreted on a dedicated work station using MPR, MIP and VRT modes. A total of 80 cc of contrast was used. FINDINGS: Non-cardiac: See separate report from [facility] Rad[facility]. No significant findings on limited lung and soft tissue windows. The patient is post CABG: The native RCA appears to have occluded stents in the mid vessel. There is severe LM/LAD disease with native vessel patient The native circumflex appears occluded. There are patent SVG;s to the RCA, OM and diagonal. The diagonal graft appears displaced by the para cardiac mass. The LIMA to the LAD is patent. Para cardiac mass posterior and lateral to LV measuring 6.2 cm x 2.3 cm  on coronal view and 11.2 cm x 4.5 cm in axial view The mass impinges on the LA and posterior LV wall The overall EF appears preserved with inferior basal hypokinesis There does not appear to be direct communication between the SVG;s and the mass There does not appear to be contrast enhancement of the mass On average the HU's range from 0-100 IMPRESSION: 1. Patent SVG;s to RCA, OM, D1 with no direct communication to para cardiac mass 2.  Patent LIMA to LAD 3. Large para cardiac mass see measurements above. Note this was not present prior to CABG so not likely to be complex pericardial cyst. Mass was present In 2019/2020 on non contrast CT scans Suspect old previous contained rupture from inferior base vs chronic loculated pericardial effusion. Consider f/u angiography Of grafts to r/o communication and cardiac MRI to further assess Jenkins Rouge Electronically Signed   By: Jenkins Rouge M.D.   On: 09/14/2021 07:22   Result Date: 09/14/2021 HISTORY: 88 yo male with chest pain/anginal equiv, intermediate CAD risk, treadmill candidate EXAM: Cardiac/Coronary CTA TECHNIQUE: The patient was scanned on a [employer]. PROTOCOL: A 110 kV retrospective scan was triggered in the descending thoracic aorta at 111 HU's. Axial non-contrast 3 mm slices were carried out through the heart. The data set was analyzed on a dedicated work station and scored using the [place]. Gantry rotation speed was 250 msecs and collimation was .6 mm. Beta blockade and 0.8 mg of sl NTG was given. The 3D data set was reconstructed in 10% intervals of the 0-90 % of the R-R cycle. Diastolic phases were analyzed on a dedicated work station using MPR, MIP and VRT modes. The patient received 174mL OMNIPAQUE IOHEXOL 350 MG/ML SOLN of contrast. FINDINGS: Quality: Good, HR 70 Coronary calcium score: The patient's coronary artery calcium score is 1273, which places the patient in the 69th percentile (based on 88 yo) - RCA stent was excluded.  Coronary arteries: Normal coronary origins. S/p coronary artery bypass grafting. Right dominance. Right Coronary Artery: Dominant. There is severe in-stent restenosis in the mid-vessel [facility]). The distal RCA is occluded (CADRADS5). The vessel is bypassed distally with patent SVG to PDA. Left Main Coronary Artery: Normal. Bifurcates into the LAD and LCx arteries. Left Anterior Descending Coronary Artery: Anterior vessel which reaches the apex. Heavy calcification with severe 70-99% proximal to mid-vessel stenosis (CADRADS4a). The vessel is bypassed distally by the LIMA. The anastomosis and flow distal to that appears patent. There is a moderate sized  diagonal branch with severe 70-99% proximal mixed stenosis (CADRADS4). Left Circumflex Artery: Occluded proximally. This vessel is bypassed distally by SVG to OM1 which appears patent. Grafts: LIMA to LAD: Patent SVG to Diagonal: Patent SVG to PDA: Patent SVG to OM1: Patent Aorta: Normal size, 33 mm at the mid ascending aorta (level of the PA bifurcation) measured double oblique. Aortic atherosclerosis. No dissection. Aortic Valve: Trileaflet.  Annular and leaflet calcifications. Other findings: Large heterogenous mass noted along the LV free wall, which appears to compress the LV cavity. This appears to be a semisolid structure of about 60 HU and could represent a pseudoaneurysm or loculated pericardial effusion. Normal pulmonary vein drainage into the left atrium. Normal left atrial appendage without a thrombus. Dilated main pulmonary artery to 30 mm, suggestive of pulmonary hypertension. LVEF 65% IMPRESSION: 1. 3 vessel native CAD with distally occluded RCA and proximal LCx arteries, CADRADS = 5. 2. S/p CABG x 4 with patent LIMA to LAD, SVG to D1, SVG to PDA and SVG to OM1. 3. Coronary calcium score of 1273. This was 69th percentile (based on 88 yo matched control) - the RCA stent was excluded. 4. Normal coronary origin with right dominance. 5. Large heterogenous  mass noted along the LV free wall, which appears to compress the LV cavity. This appears to be a semisolid structure of about 60 HU and could represent a pseudoaneurysm or loculated pericardial effusion. 6. Dilated main pulmonary artery to 30 mm, suggestive of pulmonary hypertension. 7. Aortic atherosclerosis and aortic valve/annular calcifications. 8. LVEF 65% 9. Consider cMRI to further delineate paraventricular mass Electronically Signed: By: Pixie Casino M.D. On: 09/13/2021 21:31   ECHOCARDIOGRAM COMPLETE  Result Date: 09/14/2021    ECHOCARDIOGRAM REPORT   Patient Name:   Tim [name] Date of Exam: 09/14/2021 Medical Rec #:  [id number]        Height:       71.0 in Accession #:    [id number]       Weight:       169.9 lb Date of Birth:  [birth year]        BSA:          1.967 m Patient Age:    50 years         BP:           98/46 mmHg Patient Gender: M                HR:           68 bpm. Exam Location:  Inpatient Procedure: 2D Echo, Cardiac Doppler and Color Doppler Indications:    Abnormal EKG  History:        Patient has prior history of Echocardiogram examinations, most                 recent 11/11/2017. Prior CABG and CT coronaries 09/13/21 which                 showed large para cardial mass; Signs/Symptoms:Chest Pain and                 Hypotension. Pericardial effusion.  Sonographer:    Merrie Roof RDCS Referring Phys: [id number] [place]  1. Left ventricular ejection fraction, by estimation, is 60 to 65%. The left ventricle has normal function. The left ventricle has no regional wall motion abnormalities. There is mild left ventricular hypertrophy. Left ventricular diastolic parameters were normal.  2. Right ventricular systolic function is normal. The right  ventricular size is normal.  3. Left atrial size was moderately dilated.  4. Large extra cardiac fluid collection lateral to LV and impinging on LA Not clear what this may represent see CTA report . Large pericardial effusion.  5.  The mitral valve is normal in structure. Trivial mitral valve regurgitation. No evidence of mitral stenosis.  6. The aortic valve is tricuspid. There is moderate calcification of the aortic valve. Aortic valve regurgitation is trivial. Aortic valve sclerosis/calcification is present, without any evidence of aortic stenosis.  7. The inferior vena cava is normal in size with greater than 50% respiratory variability, suggesting right atrial pressure of 3 mmHg. FINDINGS  Left Ventricle: Left ventricular ejection fraction, by estimation, is 60 to 65%. The left ventricle has normal function. The left ventricle has no regional wall motion abnormalities. The left ventricular internal cavity size was normal in size. There is  mild left ventricular hypertrophy. Left ventricular diastolic parameters were normal. Right Ventricle: The right ventricular size is normal. No increase in right ventricular wall thickness. Right ventricular systolic function is normal. Left Atrium: Left atrial size was moderately dilated. Right Atrium: Right atrial size was normal in size. Pericardium: Large extra cardiac fluid collection lateral to LV and impinging on LA Not clear what this may represent see CTA report. A large pericardial effusion is present. Mitral Valve: The mitral valve is normal in structure. Trivial mitral valve regurgitation. No evidence of mitral valve stenosis. Tricuspid Valve: The tricuspid valve is normal in structure. Tricuspid valve regurgitation is not demonstrated. No evidence of tricuspid stenosis. Aortic Valve: The aortic valve is tricuspid. There is moderate calcification of the aortic valve. Aortic valve regurgitation is trivial. Aortic valve sclerosis/calcification is present, without any evidence of aortic stenosis. Aortic valve mean gradient measures 3.0 mmHg. Aortic valve peak gradient measures 5.5 mmHg. Aortic valve area, by VTI measures 3.76 cm. Pulmonic Valve: The pulmonic valve was normal in structure.  Pulmonic valve regurgitation is not visualized. No evidence of pulmonic stenosis. Aorta: The aortic root is normal in size and structure. Venous: The inferior vena cava is normal in size with greater than 50% respiratory variability, suggesting right atrial pressure of 3 mmHg. IAS/Shunts: No atrial level shunt detected by color flow Doppler.  LEFT VENTRICLE PLAX 2D LVIDd:         3.50 cm   Diastology LV PW:         1.30 cm   LV e' medial:    8.17 cm/s LV IVS:        1.30 cm   LV E/e' medial:  13.5 LVOT diam:     2.20 cm   LV e' lateral:   6.95 cm/s LV SV:         100       LV E/e' lateral: 15.8 LV SV Index:   51 LVOT Area:     3.80 cm  RIGHT VENTRICLE RV Basal diam:  3.70 cm LEFT ATRIUM             Index        RIGHT ATRIUM           Index LA diam:        4.50 cm 2.29 cm/m   RA Area:     25.10 cm LA Vol (A2C):   94.9 ml 48.24 ml/m  RA Volume:   72.10 ml  36.65 ml/m LA Vol (A4C):   47.4 ml 24.09 ml/m LA Biplane Vol: 73.3 ml 37.26 ml/m  AORTIC  VALVE AV Area (Vmax):    3.83 cm AV Area (Vmean):   3.67 cm AV Area (VTI):     3.76 cm AV Vmax:           117.00 cm/s AV Vmean:          80.800 cm/s AV VTI:            0.266 m AV Peak Grad:      5.5 mmHg AV Mean Grad:      3.0 mmHg LVOT Vmax:         118.00 cm/s LVOT Vmean:        78.100 cm/s LVOT VTI:          0.263 m LVOT/AV VTI ratio: 0.99  AORTA Ao Root diam: 3.50 cm MITRAL VALVE MV Area (PHT): 3.68 cm     SHUNTS MV Decel Time: 206 msec     Systemic VTI:  0.26 m MV E velocity: 110.00 cm/s  Systemic Diam: 2.20 cm MV A velocity: 73.20 cm/s MV E/A ratio:  1.50 Jenkins Rouge MD Electronically signed by Jenkins Rouge MD Signature Date/Time: 09/14/2021/9:56:28 AM    Final     Cardiac Studies   CT reviewed  Patient Profile     88 y.o. male with CAD, carotid disease.  Prior CABG in 2018.  Chronic pericardial effusion. "Partially fluid density partially solid 11 x 8 x 4 cm multilobulated mass (utilizing double oblique MPR technique) contiguous with left lateral wall  of the heart the left atrium and left ventricle anteriorly and to the right. A patent saphenous vein graft courses directly into this area of interest (see double oblique MPR images). "  Assessment & Plan    Chest pain/CAD: Cardiac CTA shows that grafts are patent.  Cardiac MRI pending.  This should be done tomorrow to further characterize the para cardiac mass  Hypertension: Blood pressure well controlled.  Anemia: This appears chronic, although slightly worsened on this admission.  Continue aspirin.  Given that this does not seem to be ischemic in nature and troponins are negative, will stop heparin.  Check Hemoccult given anemia.  He does state that he has been on iron pills in the past but has not taken any recently.  No symptoms of anemia at this time.  Continue to monitor.    For questions or updates, please contact [place] Please consult [email or web address] for contact info under        Signed, Larae Grooms, MD  09/15/2021, 9:21 AM

## 2021-09-15 NOTE — Progress Notes (Signed)
[facility] for Heparin Indication: chest pain/ACS Brief A/P: Heparin level within goal range Continue Heparin at current rate   Allergies  Allergen Reactions   Statins Other (See Comments)    CLASS EFFECT SEVERE LEG CRAMPS [MULTIPLE STATINS]   Repatha [Evolocumab]     Flu like symptoms    Patient Measurements: Height: 5\' 11"  (180.3 cm) Weight: 77.1 kg (169 lb 14.4 oz) IBW/kg (Calculated) : 75.3 Heparin Dosing Weight: 77.1 kg  Vital Signs: Temp: 98 F (36.7 C) (01/01 2049) Temp Source: Oral (01/01 2049) BP: 109/65 (01/01 2049) Pulse Rate: 71 (01/01 2049)  Labs: Recent Labs    09/13/21 0100 09/13/21 0129 09/13/21 0154 09/13/21 0937 09/14/21 0503 09/14/21 1344 09/15/21 0017  HGB 8.5* 8.5*  --   --  7.6*  --  8.1*  HCT 28.5* 27.9*  --   --  25.5*  --  26.5*  PLT 364 352  --   --  324  --  327  APTT 33  --   --   --   --   --   --   HEPARINUNFRC  --   --   --    < > 0.21* 0.29* 0.31  CREATININE 1.48*  --   --   --   --   --   --   TROPONINIHS 7  --  7  --   --   --   --    < > = values in this interval not displayed.     Estimated Creatinine Clearance: 35.3 mL/min (A) (by C-G formula based on SCr of 1.48 mg/dL (H)).  Assessment: 88 y.o. male with chest pain for heparin. Heparin level within goal range   Goal of Therapy:  Heparin level 0.3-0.7 units/ml Monitor platelets by anticoagulation protocol: Yes   Plan:  Continue Heparin at current rate   Phillis Knack, PharmD, BCPS

## 2021-09-15 NOTE — Care Management Important Message (Signed)
Important Message  Patient Details  Name: Tim [name] MRN: [id number] Date of Birth: 1929/04/11   Medicare Important Message Given:  Yes     Hannah Beat 09/15/2021, 2:18 PM

## 2021-09-16 ENCOUNTER — Encounter (HOSPITAL_COMMUNITY): Payer: Self-pay | Admitting: Cardiology

## 2021-09-16 ENCOUNTER — Other Ambulatory Visit (HOSPITAL_COMMUNITY): Payer: Self-pay

## 2021-09-16 DIAGNOSIS — I319 Disease of pericardium, unspecified: Secondary | ICD-10-CM

## 2021-09-16 DIAGNOSIS — I3139 Other pericardial effusion (noninflammatory): Secondary | ICD-10-CM

## 2021-09-16 DIAGNOSIS — D509 Iron deficiency anemia, unspecified: Secondary | ICD-10-CM

## 2021-09-16 DIAGNOSIS — I5189 Other ill-defined heart diseases: Secondary | ICD-10-CM

## 2021-09-16 HISTORY — DX: Iron deficiency anemia, unspecified: D50.9

## 2021-09-16 HISTORY — DX: Other ill-defined heart diseases: I51.89

## 2021-09-16 LAB — BASIC METABOLIC PANEL
Anion gap: 6 (ref 5–15)
BUN: 23 mg/dL (ref 8–23)
CO2: 24 mmol/L (ref 22–32)
Calcium: 8.4 mg/dL — ABNORMAL LOW (ref 8.9–10.3)
Chloride: 108 mmol/L (ref 98–111)
Creatinine, Ser: 1.28 mg/dL — ABNORMAL HIGH (ref 0.61–1.24)
GFR, Estimated: 53 mL/min — ABNORMAL LOW (ref >60)
Glucose, Bld: 101 mg/dL — ABNORMAL HIGH (ref 70–99)
Potassium: 4.5 mmol/L (ref 3.5–5.1)
Sodium: 138 mmol/L (ref 135–145)

## 2021-09-16 LAB — CBC
HCT: 24.3 % — ABNORMAL LOW (ref 39.0–52.0)
Hemoglobin: 7.2 g/dL — ABNORMAL LOW (ref 13.0–17.0)
MCH: 22.9 pg — ABNORMAL LOW (ref 26.0–34.0)
MCHC: 29.6 g/dL — ABNORMAL LOW (ref 30.0–36.0)
MCV: 77.1 fL — ABNORMAL LOW (ref 80.0–100.0)
Platelets: 314 10*3/uL (ref 150–400)
RBC: 3.15 MIL/uL — ABNORMAL LOW (ref 4.22–5.81)
RDW: 18.7 % — ABNORMAL HIGH (ref 11.5–15.5)
WBC: 6.4 10*3/uL (ref 4.0–10.5)
nRBC: 0 % (ref 0.0–0.2)

## 2021-09-16 LAB — SEDIMENTATION RATE: Sed Rate: 75 mm/hr — ABNORMAL HIGH (ref 0–16)

## 2021-09-16 LAB — C-REACTIVE PROTEIN: CRP: 4.4 mg/dL — ABNORMAL HIGH (ref <1.0)

## 2021-09-16 MED ORDER — DOCUSATE SODIUM 100 MG PO CAPS: 100.0000 mg | ORAL_CAPSULE | Freq: Two times a day (BID) | ORAL | Status: DC

## 2021-09-16 MED ORDER — PREDNISONE 10 MG PO TABS: 10.0000 mg | ORAL_TABLET | Freq: Every day | ORAL | Status: DC

## 2021-09-16 MED ORDER — PREDNISONE 20 MG PO TABS: 20.0000 mg | ORAL_TABLET | Freq: Every day | ORAL | Status: DC

## 2021-09-16 MED ORDER — COLCHICINE 0.6 MG PO TABS
0.6000 mg | ORAL_TABLET | Freq: Every day | ORAL | 3 refills | Status: DC
  Filled 2021-09-16: qty 30, 30d supply, fill #0

## 2021-09-16 MED ORDER — METOPROLOL SUCCINATE ER 50 MG PO TB24
50.0000 mg | ORAL_TABLET | Freq: Every day | ORAL | 11 refills | Status: DC
  Filled 2021-09-16: qty 30, 30d supply, fill #0

## 2021-09-16 MED ORDER — PREDNISONE 5 MG PO TABS: 5.0000 mg | ORAL_TABLET | Freq: Every day | ORAL | Status: DC

## 2021-09-16 MED ORDER — PREDNISONE 5 MG PO TABS
20.0000 mg | ORAL_TABLET | Freq: Every day | ORAL | 0 refills | Status: DC
  Filled 2021-09-16: qty 36, 15d supply, fill #0

## 2021-09-16 MED ORDER — DOCUSATE SODIUM 100 MG PO CAPS
100.0000 mg | ORAL_CAPSULE | Freq: Two times a day (BID) | ORAL | 0 refills | Status: DC
  Filled 2021-09-16: qty 10, 5d supply, fill #0

## 2021-09-16 MED ORDER — MELATONIN 3 MG PO TABS
6.0000 mg | ORAL_TABLET | Freq: Every day | ORAL | 0 refills | Status: DC
  Filled 2021-09-16: qty 30, 15d supply, fill #0

## 2021-09-16 MED ORDER — PANTOPRAZOLE SODIUM 40 MG PO TBEC
40.0000 mg | DELAYED_RELEASE_TABLET | Freq: Every day | ORAL | 11 refills | Status: DC
  Filled 2021-09-16: qty 30, 30d supply, fill #0

## 2021-09-16 NOTE — Discharge Summary (Signed)
Discharge Summary    Patient ID: Tim [name] MRN: [id number]; DOB: 14-Mar-1929  Admit date: 09/12/2021 Discharge date: 09/16/2021  PCP:  Raelene Bott, MD   [facility] Providers Cardiologist:  Candee Furbish, MD        Discharge Diagnoses    Principal Problem:   Cardiac mass Active Problems:   Primary hypertension   Chest pain   Iron deficiency anemia    Diagnostic Studies/Procedures    CARDIAC MRI: 09/15/2021 IMPRESSION: 1. Large mass adjacent to LV lateral wall measuring 102mm x 60mm x 15mm, causing LV/LA compression. Mass is isointense to myocardium on T1 weighted imaging, does not suppress with fat saturation, hyperintense to myocardium on T2 weighted imaging, no contrast uptake on first pass perfusion, and no late gadolinium enhancement. Prior imaging shows mass has been present since post CABG CT chest in 10/2017, but was not present on pre-CABG CT. Findings are consistent with a loculated pericardial effusion. There is a rim of enhancement around the mass on LGE images, consistent with chronic pericardial inflammation.   2. Moderate LV hypertrophy with markedly elevated extracelluar volume (41%) raises concern for cardiac amyloidosis. While no myocardial LGE is seen, could represent early amyloidosis, as ECV elevation precedes LGE. Recommend checking SPEP/UPEP and PYP scan.   3. Small LV size with normal systolic function (EF 31%). Septal flattening during diastole   4.  Small RV size with mild systolic dysfunction (EF 51%)     CARDIAC CT: 09/13/2021 IMPRESSION: 1. Patent SVG;s to RCA, OM, D1 with no direct communication to para cardiac mass   2.  Patent LIMA to LAD   3. Large para cardiac mass see measurements above. Note this was not present prior to CABG so not likely to be complex pericardial cyst. Mass was present In 2019/2020 on non contrast CT scans Suspect old previous contained rupture from inferior base vs chronic loculated pericardial effusion.  Consider f/u angiography of grafts to r/o communication and cardiac MRI to further assess    RADIOLOGY IMPRESSION: 1. Low lung volumes with dependent changes within the posterior lung bases. 2. Persistent sub solid nodules within the right upper lobe measuring up to 1.3 cm.Adenocarcinoma cannot be excluded. Follow up by CT is recommended in 12 months, with continued annual surveillance for a minimum of 3 years. These recommendations are taken from: Recommendations for the Management of Subsolid Pulmonary Nodules Detected at CT: A Statement from the [place] radiology 2013; 266:1, 304-317. 3. Stable appearance of prominent mediastinal and hilar lymph nodes.  ECHO: 09/14/2021  1. Left ventricular ejection fraction, by estimation, is 60 to 65%. The  left ventricle has normal function. The left ventricle has no regional  wall motion abnormalities. There is mild left ventricular hypertrophy.  Left ventricular diastolic parameters were normal.   2. Right ventricular systolic function is normal. The right ventricular  size is normal.   3. Left atrial size was moderately dilated.   4. Large extra cardiac fluid collection lateral to LV and impinging on LA Not clear what this may represent see CTA report . Large pericardial effusion.   5. The mitral valve is normal in structure. Trivial mitral valve  regurgitation. No evidence of mitral stenosis.   6. The aortic valve is tricuspid. There is moderate calcification of the  aortic valve. Aortic valve regurgitation is trivial. Aortic valve  sclerosis/calcification is present, without any evidence of aortic  stenosis.   7. The inferior vena cava is normal in size with greater than 50%  respiratory variability, suggesting right atrial pressure of 3 mmHg.  _____________   History of Present Illness     Tim [name] is a 88 y.o. male with CAD s/p CABG 2018, carotid disease.  Chronic pericardial effusion. Transferred from [facility]  12/31 for CP.  From a CT done at [facility]: "Partially fluid density partially solid 11 x 8 x 4 cm multilobulated mass (utilizing double oblique MPR technique) contiguous with left lateral wall of the heart the left atrium and left ventricle anteriorly and to the right. A patent saphenous vein graft courses directly into this area of interest (see double oblique MPR images). "  Hospital Course     Consultants: None   His chest pain had lasted about 10 minutes, radiating to the left arm.  It resolved on its own, and did not recur.  He was noted to have a chronic pericardial effusion and paralyzed right hemidiaphragm.  Although his initial cardiac enzymes were negative, he was empirically heparinized.  A CT done at [facility] showed a possible SVG aneurysm, 11 x 8 x 4 cm, a multilobulated mass.  His cardiac enzymes remained negative.  He had a cardiac CT which showed patent bypass grafts and no obvious SVG aneurysm.  Prior CTs were reviewed and it was noted that the pericardiac mass is not new.  A cardiac MRI was recommended to further define this.  His enzymes were negative and his echo was without wall motion abnormalities and had normal diastolic function.  His chest pain had resolved without intervention, therefore no additional ischemic evaluation was indicated.  He was noted to be anemic, with a hemoglobin of 7.2 at discharge.  MCV was 77.  The anemia is not new, but is a little worse than usual.  He has a history of iron deficiency, and has previously been on iron tablets in the past.  He is encouraged to restart these, and follow-up with his PCP.  He denied black or tarry stools, but we were unable to obtain stool guaiac as an inpatient.  Recommend he get this done as an outpatient.  He has chronic problems with constipation, which may worsen while he is on iron tablets.  He was put on Colace twice daily.  Cardiac MRI results are above.  Findings are consistent with a loculated  pericardial effusion.There is a rim of enhancement around the mass on LGE images, consistent with chronic pericardial inflammation. This may have been the cause of his chest pain.   A sed rate was elevated at 75.  His CRP was also elevated at 4.4.  Because of the possibility of a GI bleed and his anemia, it was felt unsafe to prescribe NSAIDs.  Dr. Audie Box recommended a slow, low-dose steroid taper.  This should help the inflammation and may prevent additional chest pain.  On admission, his BUN and creatinine were elevated at 29/1.48.  Both of these improved, with discharge labs 23/1.28.  Follow-up with PCP.  On 01/03, he was seen by Dr Audie Box and all data were reviewed. Pt able to ambulate 180 ft without chest pain or SOB. He lives w/ family, who help in his care.  No further inpatient work-up is indicated and he is considered stable for discharge, to follow-up as an outpatient.  _____________  Discharge Vitals Blood pressure 103/67, pulse 88, temperature 98.1 F (36.7 C), temperature source Oral, resp. rate 16, height 5\' 11"  (1.803 m), weight 77.1 kg, SpO2 95 %.  Filed Weights   09/13/21 0003  Weight: 77.1 kg    Labs & Radiologic Studies    CBC Recent Labs    09/15/21 0017 09/16/21 0345  WBC 5.3 6.4  HGB 8.1* 7.2*  HCT 26.5* 24.3*  MCV 76.6* 77.1*  PLT 327 629   Basic Metabolic Panel Recent Labs    09/16/21 0345  NA 138  K 4.5  CL 108  CO2 24  GLUCOSE 101*  BUN 23  CREATININE 1.28*  CALCIUM 8.4*   Liver Function Tests Lab Results  Component Value Date   ALT 11 09/13/2021   AST 14 (L) 09/13/2021   ALKPHOS 78 09/13/2021   BILITOT 0.5 09/13/2021     High Sensitivity Troponin:   Recent Labs  Lab 09/13/21 0100 09/13/21 0154  TROPONINIHS 7 7    D-Dimer No results for input(s): DDIMER in the last 72 hours. Hemoglobin A1C Lab Results  Component Value Date   HGBA1C 7.1 (H) 09/13/2021   Thyroid Function Tests Lab Results  Component Value Date   TSH 3.893  09/13/2021   Lab Results  Component Value Date   ESRSEDRATE 75 (H) 09/16/2021      Component Value Date/Time   CRP 4.4 (H) 09/16/2021 0345    _____________  CT CORONARY MORPH W/CTA COR W/SCORE W/CA W/CM &/OR WO/CM  Addendum Date: 09/16/2021   ADDENDUM REPORT: 09/16/2021 08:41 EXAM: OVER-READ INTERPRETATION  CT CHEST The following report is an over-read performed by radiologist Dr. Norlene Duel [facility] on 09/16/2021. This over-read does not include interpretation of cardiac or coronary anatomy or pathology. The coronary CTA interpretation by the cardiologist is attached. COMPARISON:  CT chest 09/02/2020 FINDINGS: Prominent mediastinal and hilar lymph nodes are identified. This is unchanged when compared with 05/02/2021. The visualized portions of the lower airway and esophagus are unremarkable. There are dependent changes with ground-glass attenuation and subsegmental atelectasis within both lung bases. Sub solid nodule within the right upper lobe is again noted measuring 1.3 cm, image 16/8. Adjacent, smaller sub solid nodule within the right upper lobe measures 4 mm and is also unchanged. No acute findings within the imaged portions of the upper abdomen. Degenerative changes identified within the imaged portions of the thoracic spine. Previous median sternotomy. IMPRESSION: 1. Low lung volumes with dependent changes within the posterior lung bases. 2. Persistent sub solid nodules within the right upper lobe measuring up to 1.3 cm. Adenocarcinoma cannot be excluded. Follow up by CT is recommended in 12 months, with continued annual surveillance for a minimum of 3 years. These recommendations are taken from: Recommendations for the Management of Subsolid Pulmonary Nodules Detected at CT: A Statement from the [place] radiology 2013; 266:1, 304-317. 3. Stable appearance of prominent mediastinal and hilar lymph nodes. Electronically Signed   By: Kerby Moors M.D.   On:  09/16/2021 08:41   Addendum Date: 09/14/2021   ADDENDUM REPORT: 09/14/2021 07:22 CLINICAL DATA:  Chest pain Post CABG EXAM: Cardiac CTA MEDICATIONS: Sub lingual nitro. 4 mg and lopressor mg TECHNIQUE: The patient was scanned on a [employer] 192 scanner. Gantry rotation speed was 250 msecs. Collimation was. 6 mm . A 120 kV prospective scan was triggered in the ascending thoracic aorta at 140 HU's with full mA between 30-70% of the R-R interval . Average HR during the scan was bpm. The 3D data set was interpreted on a dedicated work station using MPR, MIP and VRT modes. A total of 80 cc of contrast was used. FINDINGS: Non-cardiac: See separate report from [facility].  No significant findings on limited lung and soft tissue windows. The patient is post CABG: The native RCA appears to have occluded stents in the mid vessel. There is severe LM/LAD disease with native vessel patient The native circumflex appears occluded. There are patent SVG;s to the RCA, OM and diagonal. The diagonal graft appears displaced by the para cardiac mass. The LIMA to the LAD is patent. Para cardiac mass posterior and lateral to LV measuring 6.2 cm x 2.3 cm on coronal view and 11.2 cm x 4.5 cm in axial view The mass impinges on the LA and posterior LV wall The overall EF appears preserved with inferior basal hypokinesis There does not appear to be direct communication between the SVG;s and the mass There does not appear to be contrast enhancement of the mass On average the HU's range from 0-100 IMPRESSION: 1. Patent SVG;s to RCA, OM, D1 with no direct communication to para cardiac mass 2.  Patent LIMA to LAD 3. Large para cardiac mass see measurements above. Note this was not present prior to CABG so not likely to be complex pericardial cyst. Mass was present In 2019/2020 on non contrast CT scans Suspect old previous contained rupture from inferior base vs chronic loculated pericardial effusion. Consider f/u angiography Of grafts  to r/o communication and cardiac MRI to further assess Jenkins Rouge Electronically Signed   By: Jenkins Rouge M.D.   On: 09/14/2021 07:22   Result Date: 09/16/2021 HISTORY: 88 yo male with chest pain/anginal equiv, intermediate CAD risk, treadmill candidate EXAM: Cardiac/Coronary CTA TECHNIQUE: The patient was scanned on a [employer]. PROTOCOL: A 110 kV retrospective scan was triggered in the descending thoracic aorta at 111 HU's. Axial non-contrast 3 mm slices were carried out through the heart. The data set was analyzed on a dedicated work station and scored using the [place]. Gantry rotation speed was 250 msecs and collimation was .6 mm. Beta blockade and 0.8 mg of sl NTG was given. The 3D data set was reconstructed in 10% intervals of the 0-90 % of the R-R cycle. Diastolic phases were analyzed on a dedicated work station using MPR, MIP and VRT modes. The patient received 15mL OMNIPAQUE IOHEXOL 350 MG/ML SOLN of contrast. FINDINGS: Quality: Good, HR 70 Coronary calcium score: The patient's coronary artery calcium score is 1273, which places the patient in the 69th percentile (based on 88 yo) - RCA stent was excluded. Coronary arteries: Normal coronary origins. S/p coronary artery bypass grafting. Right dominance. Right Coronary Artery: Dominant. There is severe in-stent restenosis in the mid-vessel [facility]). The distal RCA is occluded (CADRADS5). The vessel is bypassed distally with patent SVG to PDA. Left Main Coronary Artery: Normal. Bifurcates into the LAD and LCx arteries. Left Anterior Descending Coronary Artery: Anterior vessel which reaches the apex. Heavy calcification with severe 70-99% proximal to mid-vessel stenosis (CADRADS4a). The vessel is bypassed distally by the LIMA. The anastomosis and flow distal to that appears patent. There is a moderate sized diagonal branch with severe 70-99% proximal mixed stenosis (CADRADS4). Left Circumflex Artery: Occluded proximally. This vessel  is bypassed distally by SVG to OM1 which appears patent. Grafts: LIMA to LAD: Patent SVG to Diagonal: Patent SVG to PDA: Patent SVG to OM1: Patent Aorta: Normal size, 33 mm at the mid ascending aorta (level of the PA bifurcation) measured double oblique. Aortic atherosclerosis. No dissection. Aortic Valve: Trileaflet.  Annular and leaflet calcifications. Other findings: Large heterogenous mass noted along the LV free wall, which appears to  compress the LV cavity. This appears to be a semisolid structure of about 60 HU and could represent a pseudoaneurysm or loculated pericardial effusion. Normal pulmonary vein drainage into the left atrium. Normal left atrial appendage without a thrombus. Dilated main pulmonary artery to 30 mm, suggestive of pulmonary hypertension. LVEF 65% IMPRESSION: 1. 3 vessel native CAD with distally occluded RCA and proximal LCx arteries, CADRADS = 5. 2. S/p CABG x 4 with patent LIMA to LAD, SVG to D1, SVG to PDA and SVG to OM1. 3. Coronary calcium score of 1273. This was 69th percentile (based on 88 yo matched control) - the RCA stent was excluded. 4. Normal coronary origin with right dominance. 5. Large heterogenous mass noted along the LV free wall, which appears to compress the LV cavity. This appears to be a semisolid structure of about 60 HU and could represent a pseudoaneurysm or loculated pericardial effusion. 6. Dilated main pulmonary artery to 30 mm, suggestive of pulmonary hypertension. 7. Aortic atherosclerosis and aortic valve/annular calcifications. 8. LVEF 65% 9. Consider cMRI to further delineate paraventricular mass Electronically Signed: By: Pixie Casino M.D. On: 09/13/2021 21:31   MR CARDIAC MORPHOLOGY W WO CONTRAST  Result Date: 09/15/2021 CLINICAL DATA:  Evaluate pericardial mass EXAM: CARDIAC MRI TECHNIQUE: The patient was scanned on a 1.5 Tesla Siemens magnet. A dedicated cardiac coil was used. Functional imaging was done using [employer] sequences. 2,3, and 4 chamber  views were done to assess for RWMA's. Modified Simpson's rule using a short axis stack was used to calculate an ejection fraction on a dedicated work Conservation officer, nature. The patient received 10 cc of Gadavist. After 10 minutes inversion recovery sequences were used to assess for infiltration and scar tissue. CONTRAST:  10 cc  of Gadavist FINDINGS: Left ventricle: -Small size -Moderate hypertrophy -Normal systolic function.  Septal flattening during diastole -Markedly elevated ECV (41%) -No LGE LV EF: 57% (Normal 56-78%) Absolute volumes: LV EDV: 84mL (Normal 77-195 mL) LV ESV: 53mL (Normal 19-72 mL) LV SV: 86mL (Normal 51-133 mL) CO: 2.9L/min (Normal 2.8-8.8 L/min) Indexed volumes: LV EDV: 14mL/sq-m (Normal 47-92 mL/sq-m) LV ESV: 39mL/sq-m (Normal 13-30 mL/sq-m) LV SV: 32mL/sq-m (Normal 32-62 mL/sq-m) CI: 1.5L/min/sq-m (Normal 1.7-4.2 L/min/sq-m) Right ventricle: Small size with mild systolic dysfunction RV EF:  40% (Normal 47-74%) Absolute volumes: RV EDV: 1106mL (Normal 88-227 mL) RV ESV: 18mL (Normal 23-103 mL) RV SV: 1mL (Normal 52-138 mL) CO: 3.0L/min (Normal 2.8-8.8 L/min) Indexed volumes: RV EDV: 73mL/sq-m (Normal 55-105 mL/sq-m) RV ESV: 35mL/sq-m (Normal 15-43 mL/sq-m) RV SV: 53mL/sq-m (Normal 32-64 mL/sq-m) CI: 1.5L/min/sq-m (Normal 1.7-4.2 L/min/sq-m) Left atrium: Normal size Right atrium: Normal size Mitral valve: No regurgitation Aortic valve: No regurgitation Tricuspid valve: No regurgitation Pulmonic valve: No regurgitation Aorta: Normal proximal ascending aorta Pericardium: Large mass adjacent to LV lateral wall measuring 54mm x 53mm x 83mm, causing LV/LA compression. Mass is isointense to myocardium on T1 weighted imaging, does not suppress with fat saturation, hypertintense to myocardium on T2 weighted imaging, no LGE. This is consistent with loculated pericardial effusion. There is a rim of enhancement in LGE images around mass, consistent with chronic pericardial inflammation.  IMPRESSION: 1. Large mass adjacent to LV lateral wall measuring 86mm x 57mm x 84mm, causing LV/LA compression. Mass is isointense to myocardium on T1 weighted imaging, does not suppress with fat saturation, hyperintense to myocardium on T2 weighted imaging, no contrast uptake on first pass perfusion, and no late gadolinium enhancement. Prior imaging shows mass has been present since post CABG  CT chest in 10/2017, but was not present on pre-CABG CT. Findings are consistent with a loculated pericardial effusion. There is a rim of enhancement around the mass on LGE images, consistent with chronic pericardial inflammation. 2. Moderate LV hypertrophy with markedly elevated extracelluar volume (41%) raises concern for cardiac amyloidosis. While no myocardial LGE is seen, could represent early amyloidosis, as ECV elevation precedes LGE. Recommend checking SPEP/UPEP and PYP scan. 3. Small LV size with normal systolic function (EF 87%). Septal flattening during diastole 4.  Small RV size with mild systolic dysfunction (EF 68%) Electronically Signed   By: Oswaldo Milian M.D.   On: 09/15/2021 23:59   ECHOCARDIOGRAM COMPLETE  Result Date: 09/14/2021    ECHOCARDIOGRAM REPORT   Patient Name:   Tim [name] Date of Exam: 09/14/2021 Medical Rec #:  [id number]        Height:       71.0 in Accession #:    [id number]       Weight:       169.9 lb Date of Birth:  June 17, 1929        BSA:          1.967 m Patient Age:    70 years         BP:           98/46 mmHg Patient Gender: M                HR:           68 bpm. Exam Location:  Inpatient Procedure: 2D Echo, Cardiac Doppler and Color Doppler Indications:    Abnormal EKG  History:        Patient has prior history of Echocardiogram examinations, most                 recent 11/11/2017. Prior CABG and CT coronaries 09/13/21 which                 showed large para cardial mass; Signs/Symptoms:Chest Pain and                 Hypotension. Pericardial effusion.  Sonographer:    Merrie Roof RDCS Referring Phys: [id number] [place]  1. Left ventricular ejection fraction, by estimation, is 60 to 65%. The left ventricle has normal function. The left ventricle has no regional wall motion abnormalities. There is mild left ventricular hypertrophy. Left ventricular diastolic parameters were normal.  2. Right ventricular systolic function is normal. The right ventricular size is normal.  3. Left atrial size was moderately dilated.  4. Large extra cardiac fluid collection lateral to LV and impinging on LA Not clear what this may represent see CTA report . Large pericardial effusion.  5. The mitral valve is normal in structure. Trivial mitral valve regurgitation. No evidence of mitral stenosis.  6. The aortic valve is tricuspid. There is moderate calcification of the aortic valve. Aortic valve regurgitation is trivial. Aortic valve sclerosis/calcification is present, without any evidence of aortic stenosis.  7. The inferior vena cava is normal in size with greater than 50% respiratory variability, suggesting right atrial pressure of 3 mmHg. FINDINGS  Left Ventricle: Left ventricular ejection fraction, by estimation, is 60 to 65%. The left ventricle has normal function. The left ventricle has no regional wall motion abnormalities. The left ventricular internal cavity size was normal in size. There is  mild left ventricular hypertrophy. Left ventricular diastolic parameters were normal. Right Ventricle: The right ventricular size is normal.  No increase in right ventricular wall thickness. Right ventricular systolic function is normal. Left Atrium: Left atrial size was moderately dilated. Right Atrium: Right atrial size was normal in size. Pericardium: Large extra cardiac fluid collection lateral to LV and impinging on LA Not clear what this may represent see CTA report. A large pericardial effusion is present. Mitral Valve: The mitral valve is normal in structure. Trivial mitral valve  regurgitation. No evidence of mitral valve stenosis. Tricuspid Valve: The tricuspid valve is normal in structure. Tricuspid valve regurgitation is not demonstrated. No evidence of tricuspid stenosis. Aortic Valve: The aortic valve is tricuspid. There is moderate calcification of the aortic valve. Aortic valve regurgitation is trivial. Aortic valve sclerosis/calcification is present, without any evidence of aortic stenosis. Aortic valve mean gradient measures 3.0 mmHg. Aortic valve peak gradient measures 5.5 mmHg. Aortic valve area, by VTI measures 3.76 cm. Pulmonic Valve: The pulmonic valve was normal in structure. Pulmonic valve regurgitation is not visualized. No evidence of pulmonic stenosis. Aorta: The aortic root is normal in size and structure. Venous: The inferior vena cava is normal in size with greater than 50% respiratory variability, suggesting right atrial pressure of 3 mmHg. IAS/Shunts: No atrial level shunt detected by color flow Doppler.  LEFT VENTRICLE PLAX 2D LVIDd:         3.50 cm   Diastology LV PW:         1.30 cm   LV e' medial:    8.17 cm/s LV IVS:        1.30 cm   LV E/e' medial:  13.5 LVOT diam:     2.20 cm   LV e' lateral:   6.95 cm/s LV SV:         100       LV E/e' lateral: 15.8 LV SV Index:   51 LVOT Area:     3.80 cm  RIGHT VENTRICLE RV Basal diam:  3.70 cm LEFT ATRIUM             Index        RIGHT ATRIUM           Index LA diam:        4.50 cm 2.29 cm/m   RA Area:     25.10 cm LA Vol (A2C):   94.9 ml 48.24 ml/m  RA Volume:   72.10 ml  36.65 ml/m LA Vol (A4C):   47.4 ml 24.09 ml/m LA Biplane Vol: 73.3 ml 37.26 ml/m  AORTIC VALVE AV Area (Vmax):    3.83 cm AV Area (Vmean):   3.67 cm AV Area (VTI):     3.76 cm AV Vmax:           117.00 cm/s AV Vmean:          80.800 cm/s AV VTI:            0.266 m AV Peak Grad:      5.5 mmHg AV Mean Grad:      3.0 mmHg LVOT Vmax:         118.00 cm/s LVOT Vmean:        78.100 cm/s LVOT VTI:          0.263 m LVOT/AV VTI ratio: 0.99  AORTA Ao Root  diam: 3.50 cm MITRAL VALVE MV Area (PHT): 3.68 cm     SHUNTS MV Decel Time: 206 msec     Systemic VTI:  0.26 m MV E velocity: 110.00 cm/s  Systemic Diam: 2.20 cm MV  A velocity: 73.20 cm/s MV E/A ratio:  1.50 Jenkins Rouge MD Electronically signed by Jenkins Rouge MD Signature Date/Time: 09/14/2021/9:56:28 AM    Final    Disposition   Pt is being discharged home today in good condition.  Follow-up Plans & Appointments     Follow-up Information     Raelene Bott, MD. Schedule an appointment as soon as possible for a visit.   Specialty: Internal Medicine Contact information: [place] [address] [phone]         Jerline Pain, MD Follow up.   Specialty: Cardiology Why: The office will call. Contact information: [address] [place] [address] [phone]                Discharge Instructions     Diet - low sodium heart healthy   Complete by: As directed    Increase activity slowly   Complete by: As directed        Discharge Medications   Allergies as of 09/16/2021       Reactions   Statins Other (See Comments)   CLASS EFFECT SEVERE LEG CRAMPS [MULTIPLE STATINS]   Repatha [evolocumab]    Flu like symptoms        Medication List     STOP taking these medications    omeprazole 40 MG capsule Commonly known as: PRILOSEC Replaced by: pantoprazole 40 MG tablet       TAKE these medications    acetaminophen 500 MG tablet Commonly known as: TYLENOL Take 500 mg by mouth every 4 (four) hours as needed for mild pain.   albuterol 108 (90 Base) MCG/ACT inhaler Commonly known as: VENTOLIN HFA Inhale 2 puffs into the lungs every 6 (six) hours as needed for wheezing or shortness of breath.   aspirin EC 81 MG tablet Take 81 mg by mouth daily.   Besivance 0.6 % Susp Generic drug: Besifloxacin HCl Apply to eye every 30 (thirty) days. Drops in left eye   Breztri Aerosphere 160-9-4.8 MCG/ACT Aero Generic drug:  Budeson-Glycopyrrol-Formoterol Inhale 2 puffs into the lungs 2 (two) times daily.   diltiazem 180 MG 24 hr capsule Commonly known as: CARDIZEM CD Take 1 capsule (180 mg total) by mouth daily.   docusate sodium 100 MG capsule Commonly known as: COLACE Take 1 capsule (100 mg total) by mouth 2 (two) times daily.   doxycycline 50 MG capsule Commonly known as: VIBRAMYCIN Take 50 mg by mouth daily.   ezetimibe 10 MG tablet Commonly known as: ZETIA Take 1 tablet (10 mg total) by mouth daily.   finasteride 5 MG tablet Commonly known as: PROSCAR Take 5 mg by mouth 2 (two) times daily.   furosemide 40 MG tablet Commonly known as: LASIX TAKE 1 TABLET (40 MG TOTAL) BY MOUTH DAILY AS NEEDED FOR FLUID. What changed: when to take this   Klor-Con M20 20 MEQ tablet Generic drug: potassium chloride SA TAKE 1 TABLET BY MOUTH EVERY DAY What changed:  how much to take how to take this   melatonin 3 MG Tabs tablet Take 2 tablets (6 mg total) by mouth at bedtime.   montelukast 10 MG tablet Commonly known as: SINGULAIR TAKE 1 TABLET BY MOUTH EVERYDAY AT BEDTIME What changed: See the new instructions.   pantoprazole 40 MG tablet Commonly known as: PROTONIX Take 1 tablet (40 mg total) by mouth daily. Start taking on: September 17, 2021 Replaces: omeprazole 40 MG capsule   predniSONE 5 MG  tablet Commonly known as: DELTASONE Take 4 tablets (20 mg total) by mouth daily with breakfast. 4 tabs x 5 days, 2 tabs x 5 days, 1 tab x 5 days and stop   sodium chloride HYPERTONIC 3 % nebulizer solution Take 4 mLs by nebulization as needed for other or cough.   tamsulosin 0.4 MG Caps capsule Commonly known as: FLOMAX Take 0.4 mg by mouth 2 (two) times daily. For urinary symptoms   Trelegy Ellipta 100-62.5-25 MCG/ACT Aepb Generic drug: Fluticasone-Umeclidin-Vilant TAKE 1 PUFF BY MOUTH EVERY DAY           Outstanding Labs/Studies   None  Duration of Discharge Encounter   Greater than 30  minutes including physician time.  Signed, Rosaria Ferries, PA-C 09/16/2021, 11:31 AM

## 2021-09-16 NOTE — Progress Notes (Addendum)
Pt hjad indicated he felt a little swimmy headed at end of ambulation but denies SOB. He had ambulated 180 feet. Pt also lives with his youngest son and his wife.  Pt BP  post ambulation : 119/57, HR = 94

## 2021-09-16 NOTE — Progress Notes (Addendum)
Progress Note  Patient Name: Tim [name] Date of Encounter: 09/16/2021  [facility] Cardiologist: Candee Furbish, MD   Subjective   Had BM yesterday, feels like he needs to go today, but cannot. Chronic constipation probs PCP told him to take iron and then said ok to stop it, timing of this unclear  Inpatient Medications    Scheduled Meds:  aspirin EC  81 mg Oral Daily   diltiazem  180 mg Oral Daily   ezetimibe  10 mg Oral Daily   finasteride  10 mg Oral Daily   fluticasone furoate-vilanterol  1 puff Inhalation Daily   melatonin  6 mg Oral QHS   montelukast  10 mg Oral QHS   pantoprazole  40 mg Oral Daily   sodium chloride flush  3 mL Intravenous Q12H   tamsulosin  0.4 mg Oral BID   umeclidinium bromide  1 puff Inhalation Daily   Continuous Infusions:   PRN Meds: acetaminophen, albuterol, furosemide   Vital Signs    Vitals:   09/15/21 0908 09/15/21 1228 09/15/21 2034 09/16/21 0530  BP: 130/65 (!) 141/64 111/61 105/63  Pulse: 91 85  88  Resp: 17 15 16 16   Temp: 98 F (36.7 C) 97.8 F (36.6 C) 97.9 F (36.6 C) 98.1 F (36.7 C)  TempSrc: Oral Oral Oral Oral  SpO2: 97%  96% 95%  Weight:      Height:        Intake/Output Summary (Last 24 hours) at 09/16/2021 0904 Last data filed at 09/16/2021 0256 Gross per 24 hour  Intake 240 ml  Output 1600 ml  Net -1360 ml   Last 3 Weights 09/13/2021 01/28/2021 10/22/2020  Weight (lbs) 169 lb 14.4 oz 182 lb 182 lb  Weight (kg) 77.066 kg 82.555 kg 82.555 kg      Telemetry    SR - Personally Reviewed  ECG    None today  Physical Exam   General: Well developed, well nourished, elderly male in no acute distress Head: Eyes PERRLA, Head normocephalic and atraumatic, ++HOH Lungs: clear bilaterally to auscultation. Heart: HRRR S1 S2, without rub or gallop. No murmur. 4/4 extremity pulses are 2+ & equal. No JVD. Abdomen: Bowel sounds are present, abdomen soft and non-tender without masses or  hernias noted. Msk:  Normal strength and tone for age. Extremities: No clubbing, cyanosis or edema.    Skin:  No rashes or lesions noted. Neuro: Alert and oriented X 3. Psych:  Good affect, responds appropriately  Labs    High Sensitivity Troponin:   Recent Labs  Lab 09/13/21 0100 09/13/21 0154  TROPONINIHS 7 7     Chemistry Recent Labs  Lab 09/13/21 0100 09/16/21 0345  NA 136 138  K 3.5 4.5  CL 103 108  CO2 24 24  GLUCOSE 137* 101*  BUN 29* 23  CREATININE 1.48* 1.28*  CALCIUM 8.5* 8.4*  PROT 6.5  --   ALBUMIN 3.1*  --   AST 14*  --   ALT 11  --   ALKPHOS 78  --   BILITOT 0.5  --   GFRNONAA 45* 53*  ANIONGAP 9 6    Lipids No results for input(s): CHOL, TRIG, HDL, LABVLDL, LDLCALC, CHOLHDL in the last 168 hours.  Hematology Recent Labs  Lab 09/14/21 0503 09/15/21 0017 09/16/21 0345  WBC 5.2 5.3 6.4  RBC 3.28* 3.46* 3.15*  HGB 7.6* 8.1* 7.2*  HCT 25.5* 26.5* 24.3*  MCV 77.7* 76.6* 77.1*  MCH 23.2* 23.4* 22.9*  MCHC 29.8* 30.6 29.6*  RDW 18.7* 18.8* 18.7*  PLT 324 327 314   Thyroid  Recent Labs  Lab 09/13/21 0100  TSH 3.893    No results found for: IRON, TIBC, FERRITIN  BNPNo results for input(s): BNP, PROBNP in the last 168 hours.  DDimer No results for input(s): DDIMER in the last 168 hours.   Radiology    MR CARDIAC MORPHOLOGY W WO CONTRAST  Result Date: 09/15/2021 CLINICAL DATA:  Evaluate pericardial mass EXAM: CARDIAC MRI TECHNIQUE: The patient was scanned on a 1.5 Tesla Siemens magnet. A dedicated cardiac coil was used. Functional imaging was done using Fiesta sequences. 2,3, and 4 chamber views were done to assess for RWMA's. Modified Simpson's rule using a short axis stack was used to calculate an ejection fraction on a dedicated work Conservation officer, nature. The patient received 10 cc of Gadavist. After 10 minutes inversion recovery sequences were used to assess for infiltration and scar tissue. CONTRAST:  10 cc  of Gadavist FINDINGS: Left ventricle: -Small  size -Moderate hypertrophy -Normal systolic function.  Septal flattening during diastole -Markedly elevated ECV (41%) -No LGE LV EF: 57% (Normal 56-78%) Absolute volumes: LV EDV: 83mL (Normal 77-195 mL) LV ESV: 22mL (Normal 19-72 mL) LV SV: 22mL (Normal 51-133 mL) CO: 2.9L/min (Normal 2.8-8.8 L/min) Indexed volumes: LV EDV: 58mL/sq-m (Normal 47-92 mL/sq-m) LV ESV: 38mL/sq-m (Normal 13-30 mL/sq-m) LV SV: 35mL/sq-m (Normal 32-62 mL/sq-m) CI: 1.5L/min/sq-m (Normal 1.7-4.2 L/min/sq-m) Right ventricle: Small size with mild systolic dysfunction RV EF:  40% (Normal 47-74%) Absolute volumes: RV EDV: 167mL (Normal 88-227 mL) RV ESV: 99mL (Normal 23-103 mL) RV SV: 72mL (Normal 52-138 mL) CO: 3.0L/min (Normal 2.8-8.8 L/min) Indexed volumes: RV EDV: 20mL/sq-m (Normal 55-105 mL/sq-m) RV ESV: 60mL/sq-m (Normal 15-43 mL/sq-m) RV SV: 18mL/sq-m (Normal 32-64 mL/sq-m) CI: 1.5L/min/sq-m (Normal 1.7-4.2 L/min/sq-m) Left atrium: Normal size Right atrium: Normal size Mitral valve: No regurgitation Aortic valve: No regurgitation Tricuspid valve: No regurgitation Pulmonic valve: No regurgitation Aorta: Normal proximal ascending aorta Pericardium: Large mass adjacent to LV lateral wall measuring 54mm x 31mm x 81mm, causing LV/LA compression. Mass is isointense to myocardium on T1 weighted imaging, does not suppress with fat saturation, hypertintense to myocardium on T2 weighted imaging, no LGE. This is consistent with loculated pericardial effusion. There is a rim of enhancement in LGE images around mass, consistent with chronic pericardial inflammation. IMPRESSION: 1. Large mass adjacent to LV lateral wall measuring 41mm x 46mm x 34mm, causing LV/LA compression. Mass is isointense to myocardium on T1 weighted imaging, does not suppress with fat saturation, hyperintense to myocardium on T2 weighted imaging, no contrast uptake on first pass perfusion, and no late gadolinium enhancement. Prior imaging shows mass has been present since post  CABG CT chest in 10/2017, but was not present on pre-CABG CT. Findings are consistent with a loculated pericardial effusion. There is a rim of enhancement around the mass on LGE images, consistent with chronic pericardial inflammation. 2. Moderate LV hypertrophy with markedly elevated extracelluar volume (41%) raises concern for cardiac amyloidosis. While no myocardial LGE is seen, could represent early amyloidosis, as ECV elevation precedes LGE. Recommend checking SPEP/UPEP and PYP scan. 3. Small LV size with normal systolic function (EF 43%). Septal flattening during diastole 4.  Small RV size with mild systolic dysfunction (EF 32%) Electronically Signed   By: Oswaldo Milian M.D.   On: 09/15/2021 23:59   ECHOCARDIOGRAM COMPLETE  Result Date: 09/14/2021    ECHOCARDIOGRAM REPORT   Patient Name:  Tim [name] Date of Exam: 09/14/2021 Medical Rec #:  [id number]        Height:       71.0 in Accession #:    [id number]       Weight:       169.9 lb Date of Birth:  [birth year]        BSA:          1.967 m Patient Age:    16 years         BP:           98/46 mmHg Patient Gender: M                HR:           68 bpm. Exam Location:  Inpatient Procedure: 2D Echo, Cardiac Doppler and Color Doppler Indications:    Abnormal EKG  History:        Patient has prior history of Echocardiogram examinations, most                 recent 11/11/2017. Prior CABG and CT coronaries 09/13/21 which                 showed large para cardial mass; Signs/Symptoms:Chest Pain and                 Hypotension. Pericardial effusion.  Sonographer:    Merrie Roof RDCS Referring Phys: [id number] [place]  1. Left ventricular ejection fraction, by estimation, is 60 to 65%. The left ventricle has normal function. The left ventricle has no regional wall motion abnormalities. There is mild left ventricular hypertrophy. Left ventricular diastolic parameters were normal.  2. Right ventricular systolic function is normal. The right  ventricular size is normal.  3. Left atrial size was moderately dilated.  4. Large extra cardiac fluid collection lateral to LV and impinging on LA Not clear what this may represent see CTA report . Large pericardial effusion.  5. The mitral valve is normal in structure. Trivial mitral valve regurgitation. No evidence of mitral stenosis.  6. The aortic valve is tricuspid. There is moderate calcification of the aortic valve. Aortic valve regurgitation is trivial. Aortic valve sclerosis/calcification is present, without any evidence of aortic stenosis.  7. The inferior vena cava is normal in size with greater than 50% respiratory variability, suggesting right atrial pressure of 3 mmHg. FINDINGS  Left Ventricle: Left ventricular ejection fraction, by estimation, is 60 to 65%. The left ventricle has normal function. The left ventricle has no regional wall motion abnormalities. The left ventricular internal cavity size was normal in size. There is  mild left ventricular hypertrophy. Left ventricular diastolic parameters were normal. Right Ventricle: The right ventricular size is normal. No increase in right ventricular wall thickness. Right ventricular systolic function is normal. Left Atrium: Left atrial size was moderately dilated. Right Atrium: Right atrial size was normal in size. Pericardium: Large extra cardiac fluid collection lateral to LV and impinging on LA Not clear what this may represent see CTA report. A large pericardial effusion is present. Mitral Valve: The mitral valve is normal in structure. Trivial mitral valve regurgitation. No evidence of mitral valve stenosis. Tricuspid Valve: The tricuspid valve is normal in structure. Tricuspid valve regurgitation is not demonstrated. No evidence of tricuspid stenosis. Aortic Valve: The aortic valve is tricuspid. There is moderate calcification of the aortic valve. Aortic valve regurgitation is trivial. Aortic valve sclerosis/calcification is present, without any  evidence of aortic stenosis. Aortic  valve mean gradient measures 3.0 mmHg. Aortic valve peak gradient measures 5.5 mmHg. Aortic valve area, by VTI measures 3.76 cm. Pulmonic Valve: The pulmonic valve was normal in structure. Pulmonic valve regurgitation is not visualized. No evidence of pulmonic stenosis. Aorta: The aortic root is normal in size and structure. Venous: The inferior vena cava is normal in size with greater than 50% respiratory variability, suggesting right atrial pressure of 3 mmHg. IAS/Shunts: No atrial level shunt detected by color flow Doppler.  LEFT VENTRICLE PLAX 2D LVIDd:         3.50 cm   Diastology LV PW:         1.30 cm   LV e' medial:    8.17 cm/s LV IVS:        1.30 cm   LV E/e' medial:  13.5 LVOT diam:     2.20 cm   LV e' lateral:   6.95 cm/s LV SV:         100       LV E/e' lateral: 15.8 LV SV Index:   51 LVOT Area:     3.80 cm  RIGHT VENTRICLE RV Basal diam:  3.70 cm LEFT ATRIUM             Index        RIGHT ATRIUM           Index LA diam:        4.50 cm 2.29 cm/m   RA Area:     25.10 cm LA Vol (A2C):   94.9 ml 48.24 ml/m  RA Volume:   72.10 ml  36.65 ml/m LA Vol (A4C):   47.4 ml 24.09 ml/m LA Biplane Vol: 73.3 ml 37.26 ml/m  AORTIC VALVE AV Area (Vmax):    3.83 cm AV Area (Vmean):   3.67 cm AV Area (VTI):     3.76 cm AV Vmax:           117.00 cm/s AV Vmean:          80.800 cm/s AV VTI:            0.266 m AV Peak Grad:      5.5 mmHg AV Mean Grad:      3.0 mmHg LVOT Vmax:         118.00 cm/s LVOT Vmean:        78.100 cm/s LVOT VTI:          0.263 m LVOT/AV VTI ratio: 0.99  AORTA Ao Root diam: 3.50 cm MITRAL VALVE MV Area (PHT): 3.68 cm     SHUNTS MV Decel Time: 206 msec     Systemic VTI:  0.26 m MV E velocity: 110.00 cm/s  Systemic Diam: 2.20 cm MV A velocity: 73.20 cm/s MV E/A ratio:  1.50 Jenkins Rouge MD Electronically signed by Jenkins Rouge MD Signature Date/Time: 09/14/2021/9:56:28 AM    Final     Cardiac Studies   CARDIAC MRI: 09/15/2021 IMPRESSION: 1. Large mass  adjacent to LV lateral wall measuring 52mm x 21mm x 72mm, causing LV/LA compression. Mass is isointense to myocardium on T1 weighted imaging, does not suppress with fat saturation, hyperintense to myocardium on T2 weighted imaging, no contrast uptake on first pass perfusion, and no late gadolinium enhancement. Prior imaging shows mass has been present since post CABG CT chest in 10/2017, but was not present on pre-CABG CT. Findings are consistent with a loculated pericardial effusion. There is a rim of enhancement around the mass on LGE images,  consistent with chronic pericardial inflammation.   2. Moderate LV hypertrophy with markedly elevated extracelluar volume (41%) raises concern for cardiac amyloidosis. While no myocardial LGE is seen, could represent early amyloidosis, as ECV elevation precedes LGE. Recommend checking SPEP/UPEP and PYP scan.   3. Small LV size with normal systolic function (EF 24%). Septal flattening during diastole   4.  Small RV size with mild systolic dysfunction (EF 40%)    CARDIAC CT: 09/13/2021 IMPRESSION: 1. Patent SVG;s to RCA, OM, D1 with no direct communication to para cardiac mass   2.  Patent LIMA to LAD   3. Large para cardiac mass see measurements above. Note this was not present prior to CABG so not likely to be complex pericardial cyst. Mass was present In 2019/2020 on non contrast CT scans Suspect old previous contained rupture from inferior base vs chronic loculated pericardial effusion. Consider f/u angiography of grafts to r/o communication and cardiac MRI to further assess   RADIOLOGY IMPRESSION: 1. Low lung volumes with dependent changes within the posterior lung bases. 2. Persistent sub solid nodules within the right upper lobe measuring up to 1.3 cm.Adenocarcinoma cannot be excluded. Follow up by CT is recommended in 12 months, with continued annual surveillance for a minimum of 3 years. These recommendations are taken from: Recommendations for  the Management of Subsolid Pulmonary Nodules Detected at CT: A Statement from the [place] radiology 2013; 266:1, 304-317. 3. Stable appearance of prominent mediastinal and hilar lymph nodes.  Patient Profile     88 y.o. male with CAD, carotid disease.  Prior CABG in 2018.  Chronic pericardial effusion. "Partially fluid density partially solid 11 x 8 x 4 cm multilobulated mass (utilizing double oblique MPR technique) contiguous with left lateral wall of the heart the left atrium and left ventricle anteriorly and to the right. A patent saphenous vein graft courses directly into this area of interest (see double oblique MPR images). "  Assessment & Plan    Chest pain/CAD:  - see CTA results, grafts are patent, ez neg MI - Cardiac MRI shows loculated pericardial effusion w/ chronic pericardial inflammation - minimal LE edema, chronic daytime problem - plan per MD  Hypertension:  - on home doses Cardizem CD 180 mg qd and Lasix 40 mg qd - controlled  Anemia:  - Hgb in the 9's 2021 and 10/2020, but has been in the 8's this admit - heparin d/c'd - MCV is low - + iron deficient at 21 at [facility] 04/2021, advised to take iron, will ask him to resume - needs to do guaiac cards and f/u PCP  Pt requests his dtr be called w/ plan.  Ambulate, possible d/c today   For questions or updates, please contact [place] Please consult [email or web address] for contact info under        Signed, Rosaria Ferries, PA-C  09/16/2021, 9:04 AM

## 2021-09-16 NOTE — Plan of Care (Signed)
°  Problem: Education: Goal: Understanding of cardiac disease, CV risk reduction, and recovery process will improve Outcome: Adequate for Discharge Goal: Individualized Educational Video(s) Outcome: Adequate for Discharge

## 2021-09-16 NOTE — Progress Notes (Signed)
Discharge instructions (including medications) discussed with and copy provided to patient/caregiver 

## 2021-09-16 NOTE — Progress Notes (Signed)
SATURATION QUALIFICATIONS: (This note is used to comply with regulatory documentation for home oxygen)  Patient Saturations on Room Air at Rest = 97 %  Patient Saturations on Room Air while Ambulating = 99 %  Patient Saturations on 0 Liters of oxygen while Ambulating = 99 %  Please briefly explain why patient needs home oxygen:

## 2021-09-26 ENCOUNTER — Telehealth: Payer: Self-pay | Admitting: Cardiology

## 2021-09-26 NOTE — Telephone Encounter (Signed)
Daughter calling to see if Dr Marlou Porch can get approve for the patient to rehab course. Please advise

## 2021-09-26 NOTE — Telephone Encounter (Signed)
Lets have him come in for a visit and check a CRP and sed rate prior to going to rehab therapy. Candee Furbish, MD

## 2021-09-26 NOTE — Telephone Encounter (Signed)
Daughter [name] aware pt will need a p/hosp f/u appt in order to be cleared to go to rehab.  She will call back next week to schedule.

## 2021-09-26 NOTE — Telephone Encounter (Signed)
When will pt be able to return to rehab after his recent hospitalization doe chronic pericarditis/cardiac mass?

## 2021-10-08 ENCOUNTER — Encounter (INDEPENDENT_AMBULATORY_CARE_PROVIDER_SITE_OTHER): Payer: Medicare Other | Admitting: Ophthalmology

## 2021-10-08 ENCOUNTER — Other Ambulatory Visit: Payer: Self-pay

## 2021-10-08 DIAGNOSIS — H33301 Unspecified retinal break, right eye: Secondary | ICD-10-CM

## 2021-10-08 DIAGNOSIS — H353221 Exudative age-related macular degeneration, left eye, with active choroidal neovascularization: Secondary | ICD-10-CM

## 2021-10-08 DIAGNOSIS — H353112 Nonexudative age-related macular degeneration, right eye, intermediate dry stage: Secondary | ICD-10-CM

## 2021-10-08 DIAGNOSIS — H43813 Vitreous degeneration, bilateral: Secondary | ICD-10-CM

## 2021-10-13 ENCOUNTER — Other Ambulatory Visit (HOSPITAL_COMMUNITY): Payer: Self-pay

## 2021-10-27 NOTE — Telephone Encounter (Signed)
Pt is scheduled for 2/21.

## 2021-11-04 ENCOUNTER — Ambulatory Visit (HOSPITAL_BASED_OUTPATIENT_CLINIC_OR_DEPARTMENT_OTHER): Payer: Medicare Other | Admitting: Family

## 2021-11-19 ENCOUNTER — Other Ambulatory Visit: Payer: Self-pay

## 2021-11-19 ENCOUNTER — Encounter (INDEPENDENT_AMBULATORY_CARE_PROVIDER_SITE_OTHER): Payer: Medicare Other | Admitting: Ophthalmology

## 2021-11-19 DIAGNOSIS — H353112 Nonexudative age-related macular degeneration, right eye, intermediate dry stage: Secondary | ICD-10-CM

## 2021-11-19 DIAGNOSIS — H33301 Unspecified retinal break, right eye: Secondary | ICD-10-CM | POA: Diagnosis not present

## 2021-11-19 DIAGNOSIS — H43813 Vitreous degeneration, bilateral: Secondary | ICD-10-CM | POA: Diagnosis not present

## 2021-11-19 DIAGNOSIS — H353221 Exudative age-related macular degeneration, left eye, with active choroidal neovascularization: Secondary | ICD-10-CM | POA: Diagnosis not present

## 2021-11-27 ENCOUNTER — Other Ambulatory Visit: Payer: Self-pay | Admitting: Cardiology

## 2021-12-04 ENCOUNTER — Ambulatory Visit (INDEPENDENT_AMBULATORY_CARE_PROVIDER_SITE_OTHER): Payer: Medicare Other | Admitting: Family

## 2021-12-04 ENCOUNTER — Other Ambulatory Visit: Payer: Self-pay

## 2021-12-04 ENCOUNTER — Encounter (HOSPITAL_BASED_OUTPATIENT_CLINIC_OR_DEPARTMENT_OTHER): Payer: Self-pay | Admitting: Family

## 2021-12-04 VITALS — BP 126/80 | HR 81 | Ht 71.0 in | Wt 181.2 lb

## 2021-12-04 DIAGNOSIS — Z951 Presence of aortocoronary bypass graft: Secondary | ICD-10-CM

## 2021-12-04 DIAGNOSIS — I2510 Atherosclerotic heart disease of native coronary artery without angina pectoris: Secondary | ICD-10-CM | POA: Diagnosis not present

## 2021-12-04 DIAGNOSIS — I6523 Occlusion and stenosis of bilateral carotid arteries: Secondary | ICD-10-CM | POA: Diagnosis not present

## 2021-12-04 DIAGNOSIS — I319 Disease of pericardium, unspecified: Secondary | ICD-10-CM

## 2021-12-04 MED ORDER — METOPROLOL SUCCINATE ER 50 MG PO TB24: 50.0000 mg | ORAL_TABLET | Freq: Every day | ORAL | 1 refills | Status: DC

## 2021-12-04 NOTE — Progress Notes (Signed)
? ?Office Visit  ?  ?Patient Name: Tim [name] ?Date of Encounter: 12/05/2021 ? ?PCP:  Raelene Bott, MD ?  ?[place]  ?Cardiologist:  Candee Furbish, MD  ?Advanced Practice Provider:  No care team member to display ?Electrophysiologist:  None  ?   ? ?Chief Complaint  ?  ?Tim [name] is a 88 y.o. male with a hx of CAD s/p CABG 2018, carotid disease, chronic pericardial effusion presents today for hospital follow up  ? ?Past Medical History  ?  ?Past Medical History:  ?Diagnosis Date  ? AAA (abdominal aortic aneurysm)   ? 3.2 cm 07/2016; 2-3 year f/u recommended  ? Asthma   ? BPH (benign prostatic hypertrophy)   ? CAD (coronary artery disease)   ? 9 STEMI 2001, PCI, RCA, residual 100% circumflex  /   PCI for in-stent restenosis June, 2001  /  nuclear February, 2010, no ischemia  ? Cancer [facility])   ? skin cancer removed  ? Cardiac mass 09/16/2021  ? Carotid artery disease ([place])   ? Doppler, June, 2011, 0-39% bilateral, pt denies  ? Cervical disc disease   ? Status post neurosurgery  ? Chest pain 09/13/2021  ? Colon polyps   ? Ejection fraction   ? EF 55-65%, echo, 2009  ? Elevated CPK   ? Chronic mild CPK elevation  ? Emphysema   ? Mild  ? Fatigue   ? Morning fatigue, August, 2011  ? GERD (gastroesophageal reflux disease)   ? Headache   ? back of head  ? Hyperlipidemia   ? Hypertension   ? Iron deficiency anemia 09/16/2021  ? Low back pain   ? February, 2013  ? Myocardial infarction [facility])   ? 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 Statin intolerance   ? Elevated CPK in the past  ? ?Past Surgical History:  ?Procedure Laterality Date  ? BREAST SURGERY    ? right breast removed  ? CERVICAL DISC SURGERY    ? CORONARY ARTERY BYPASS GRAFT N/A 08/24/2017  ? Procedure: CORONARY ARTERY BYPASS GRAFTING times four using left internal mammary artery and bilateral saphenous vein, using endoscope. TEE;  Surgeon: Grace Isaac, MD;  Location: [place];  Service:  Open Heart Surgery;  Laterality: N/A;  ? LEFT HEART CATH AND CORONARY ANGIOGRAPHY N/A 07/29/2017  ? Procedure: LEFT HEART CATH AND CORONARY ANGIOGRAPHY;  Surgeon: Nelva Bush, MD;  Location: [place] [facility];  Service: Cardiovascular;  Laterality: N/A;  ? SKIN CANCER EXCISION    ? TEE WITHOUT CARDIOVERSION N/A 08/24/2017  ? Procedure: TRANSESOPHAGEAL ECHOCARDIOGRAM (TEE);  Surgeon: Grace Isaac, MD;  Location: [place];  Service: Open Heart Surgery;  Laterality: N/A;  ? ? ?Allergies ? ?Allergies  ?Allergen Reactions  ? Statins Other (See Comments)  ?  CLASS EFFECT ?SEVERE LEG CRAMPS [MULTIPLE STATINS]  ? Repatha [Evolocumab]   ?  Flu like symptoms  ? ? ?History of Present Illness  ?  ?Tim [name] is a 88 y.o. male with a hx of CAD s/p CABG 2018, carotid disease, chronic pericardial effusion last seen during hospitalization 09/2021. ? ?He was hospitalized 09/12/2021 after transfer from [facility] for chest pain. The chest pain lasted 10 minutes, radiated to the left arm, resolved on its own and did not recur.  He was empirically heparinized.  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  Cardiac CT showed patent bypass graft and no obvious SVG aneurysm.  The pericardiac mass was noted to be old.  Cardiac MRI performed consistent with loculated pericardial effusion with rim of enhancement consistent with chronic pericardial inflammation thought to be because of his chest pain.  NSAIDs unable to be prescribed due to his anemia and risk of GI bleed.  He was started on a slow low-dose steroid taper. ? ?He presents today for follow-up with his daughter.  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  He reports no chest pain, pressure, 90s.  Reports exertional dyspnea is overall stable at baseline.  He is hopeful to start doing outpatient cardiac rehab at [facility].  He does often ambulate using a cane and his daughter is  trying to convince him to use his [place] more often.  He is not interested in pursuing additional lipid-lowering therapy due to previous intolerances. ? ?EKGs/Labs/Other Studies Reviewed:  ? ?The following studies were reviewed today: ? ?CARDIAC MRI: 09/15/2021 ?IMPRESSION: ?1. Large mass adjacent to LV lateral wall measuring 71m x 625mx ?4048mcausing LV/LA compression. Mass is isointense to myocardium on T1 weighted imaging, does not suppress with fat saturation, hyperintense to myocardium on T2 weighted imaging, no contrast uptake on first pass perfusion, and no late gadolinium enhancement. Prior imaging shows mass has been present since post CABG CT chest in 10/2017, but was not present on pre-CABG CT. Findings are consistent with a loculated pericardial effusion. There is a rim of enhancement around the mass on LGE images, consistent with chronic pericardial inflammation. ?  ?2. Moderate LV hypertrophy with markedly elevated extracelluar ?volume (41%) raises concern for cardiac amyloidosis. While no ?myocardial LGE is seen, could represent early amyloidosis, as ECV ?elevation precedes LGE. Recommend checking SPEP/UPEP and PYP scan. ?  ?3. Small LV size with normal systolic function (EF 57%16%Septal ?flattening during diastole ?  ?4.  Small RV size with mild systolic dysfunction (EF 40%10%  ?  ?CARDIAC CT: 09/13/2021 ?IMPRESSION: ?1. Patent SVG;s to RCA, OM, D1 with no direct communication to para cardiac mass ?  ?2.  Patent LIMA to LAD ?  ?3. Large para cardiac mass see measurements above. Note this was not present prior to CABG so not likely to be complex pericardial cyst. Mass was present In 2019/2020 on non contrast CT scans Suspect old previous contained rupture from inferior base vs chronic loculated pericardial effusion. Consider f/u angiography of grafts to r/o communication and cardiac MRI to further assess  ?  ?RADIOLOGY IMPRESSION: ?1. Low lung volumes with dependent changes within the posterior lung  bases. ?2. Persistent sub solid nodules within the right upper lobe ?measuring up to 1.3 cm.Adenocarcinoma cannot be excluded. Follow up by CT is recommended in 12 months, with continued annual surveillance for a minimum of 3 years. These recommendations are taken from: Recommendations for the ?Management of Subsolid Pulmonary Nodules Detected at CT: A Statement from the [facility] [facility] 2013; 266:1, [phone]. Stable appearance of prominent mediastinal and hilar lymph nodes. ?  ?ECHO: 09/14/2021 ? 1. Left ventricular ejection fraction, by estimation, is 60 to 65%. The  ?left ventricle has normal function. The left ventricle has no regional  ?wall motion abnormalities. There is mild left ventricular hypertrophy.  ?Left ventricular diastolic parameters were normal.  ? 2. Right ventricular systolic function is normal. The right ventricular  ?size is normal.  ? 3. Left atrial size was moderately dilated.  ? 4.  Large extra cardiac fluid collection lateral to LV and impinging on LA Not clear what this may represent see CTA report . Large pericardial effusion.  ? 5. The mitral valve is normal in structure. Trivial mitral valve  ?regurgitation. No evidence of mitral stenosis.  ? 6. The aortic valve is tricuspid. There is moderate calcification of the  ?aortic valve. Aortic valve regurgitation is trivial. Aortic valve  ?sclerosis/calcification is present, without any evidence of aortic  ?stenosis.  ? 7. The inferior vena cava is normal in size with greater than 50%  ?respiratory variability, suggesting right atrial pressure of 3 mmHg.  ? ?EKG: No EKG today ? ?Recent Labs: ?09/13/2021: ALT 11; TSH 3.893 ?09/16/2021: BUN 23; Creatinine, Ser 1.28; Hemoglobin 7.2; Platelets 314; Potassium 4.5; Sodium 138  ?Recent Lipid Panel ?   ?Component Value Date/Time  ? CHOL 159 07/10/2021 0722  ? TRIG 110 07/10/2021 0722  ? HDL 35 (L) 07/10/2021 0722  ? CHOLHDL 4.5 07/10/2021 0722  ? [place] 104 (H) 07/10/2021 6644  ? ?Home  Medications  ? ?Current Meds  ?Medication Sig  ? acetaminophen (TYLENOL) 500 MG tablet Take 500 mg by mouth every 4 (four) hours as needed for mild pain.  ? albuterol (PROVENTIL HFA;VENTOLIN HFA) 108 (90 BAS

## 2021-12-04 NOTE — Patient Instructions (Addendum)
Medication Instructions:  ?Continue your current medications.  ?Try to take your Melatonin only as needed.  ? ?*If you need a refill on your cardiac medications before your next appointment, please call your pharmacy* ? ? ?Lab Work: ?Your physician recommends that you return for lab work today: CRP, sed rate ? ?If these are improving, we will send PT a note that you are cleared to participate.  ? ?If you have labs (blood work) drawn today and your tests are completely normal, you will receive your results only by: ?MyChart Message (if you have MyChart) OR ?A paper copy in the mail ?If you have any lab test that is abnormal or we need to change your treatment, we will call you to review the results. ? ?Testing/Procedures: ?Your physician has requested that you have a carotid duplex September 2023. This test is an ultrasound of the carotid arteries in your neck. It looks at blood flow through these arteries that supply the brain with blood. Allow one hour for this exam. There are no restrictions or special instructions.  ? ?Follow-Up: ?At [facility], you and your health needs are our priority.  As part of our continuing mission to provide you with exceptional heart care, we have created designated Provider Care Teams.  These Care Teams include your primary Cardiologist (physician) and Advanced Practice Providers (APPs -  Physician Assistants and Nurse Practitioners) who all work together to provide you with the care you need, when you need it. ? ?We recommend signing up for the patient portal called "MyChart".  Sign up information is provided on this After Visit Summary.  MyChart is used to connect with patients for Virtual Visits (Telemedicine).  Patients are able to view lab/test results, encounter notes, upcoming appointments, etc.  Non-urgent messages can be sent to your provider as well.   ?To learn more about what you can do with MyChart, go to [email or web address].   ? ?Your next appointment:   ?03/09/22  at 8 AM with Dr. Marlou Porch at [facility] ? ? ?Other Instructions ? ?Heart Healthy Diet Recommendations: ?A low-salt diet is recommended. Meats should be grilled, baked, or boiled. Avoid fried foods. Focus on lean protein sources like fish or chicken with vegetables and fruits. The American Heart Association is a Microbiologist!  American Heart Association Diet and Lifeystyle Recommendations   ? ?Exercise recommendations: ?The American Heart Association recommends 150 minutes of moderate intensity exercise weekly. ?Try 30 minutes of moderate intensity exercise 4-5 times per week. ?This could include walking, jogging, or swimming. ?  ?

## 2021-12-05 ENCOUNTER — Encounter (HOSPITAL_BASED_OUTPATIENT_CLINIC_OR_DEPARTMENT_OTHER): Payer: Self-pay

## 2021-12-05 ENCOUNTER — Encounter (HOSPITAL_BASED_OUTPATIENT_CLINIC_OR_DEPARTMENT_OTHER): Payer: Self-pay | Admitting: Family

## 2021-12-05 LAB — SEDIMENTATION RATE: Sed Rate: 10 mm/h (ref 0–30)

## 2021-12-05 LAB — C-REACTIVE PROTEIN: CRP: 16 mg/L — ABNORMAL HIGH (ref 0–10)

## 2021-12-05 MED ORDER — COLCHICINE 0.6 MG PO TABS: 0.6000 mg | ORAL_TABLET | Freq: Every day | ORAL | 5 refills | Status: DC

## 2021-12-05 NOTE — Telephone Encounter (Signed)
Please advise 

## 2021-12-07 NOTE — Progress Notes (Signed)
Addendum 12/05/21 ? ?Labs 12/04/21 with CRP 16 and sed rate 10. Plan to continue Colchicine therapy for 6 months. Discussed with Dr. Marlou Porch and patient may begin cardiac rehab. Will route to [facility] cardiac rehab team so they are aware.  ? ?Loel Dubonnet, NP  ?

## 2022-01-07 ENCOUNTER — Encounter (INDEPENDENT_AMBULATORY_CARE_PROVIDER_SITE_OTHER): Payer: Medicare Other | Admitting: Ophthalmology

## 2022-01-07 DIAGNOSIS — H353221 Exudative age-related macular degeneration, left eye, with active choroidal neovascularization: Secondary | ICD-10-CM | POA: Diagnosis not present

## 2022-01-07 DIAGNOSIS — H33301 Unspecified retinal break, right eye: Secondary | ICD-10-CM

## 2022-01-07 DIAGNOSIS — H43813 Vitreous degeneration, bilateral: Secondary | ICD-10-CM

## 2022-01-07 DIAGNOSIS — H353112 Nonexudative age-related macular degeneration, right eye, intermediate dry stage: Secondary | ICD-10-CM

## 2022-02-22 ENCOUNTER — Other Ambulatory Visit: Payer: Self-pay | Admitting: Cardiology

## 2022-02-25 ENCOUNTER — Encounter (INDEPENDENT_AMBULATORY_CARE_PROVIDER_SITE_OTHER): Payer: Medicare Other | Admitting: Ophthalmology

## 2022-02-25 DIAGNOSIS — H43813 Vitreous degeneration, bilateral: Secondary | ICD-10-CM | POA: Diagnosis not present

## 2022-02-25 DIAGNOSIS — H353221 Exudative age-related macular degeneration, left eye, with active choroidal neovascularization: Secondary | ICD-10-CM | POA: Diagnosis not present

## 2022-02-25 DIAGNOSIS — H353112 Nonexudative age-related macular degeneration, right eye, intermediate dry stage: Secondary | ICD-10-CM

## 2022-02-25 DIAGNOSIS — H33301 Unspecified retinal break, right eye: Secondary | ICD-10-CM

## 2022-03-09 ENCOUNTER — Ambulatory Visit (INDEPENDENT_AMBULATORY_CARE_PROVIDER_SITE_OTHER): Payer: Medicare Other | Admitting: Cardiology

## 2022-03-09 DIAGNOSIS — I2510 Atherosclerotic heart disease of native coronary artery without angina pectoris: Secondary | ICD-10-CM

## 2022-03-09 DIAGNOSIS — J449 Chronic obstructive pulmonary disease, unspecified: Secondary | ICD-10-CM | POA: Diagnosis not present

## 2022-03-09 DIAGNOSIS — I5189 Other ill-defined heart diseases: Secondary | ICD-10-CM | POA: Diagnosis not present

## 2022-03-09 DIAGNOSIS — E7800 Pure hypercholesterolemia, unspecified: Secondary | ICD-10-CM

## 2022-03-09 DIAGNOSIS — I10 Essential (primary) hypertension: Secondary | ICD-10-CM

## 2022-03-09 DIAGNOSIS — I3139 Other pericardial effusion (noninflammatory): Secondary | ICD-10-CM

## 2022-03-09 DIAGNOSIS — I6523 Occlusion and stenosis of bilateral carotid arteries: Secondary | ICD-10-CM

## 2022-03-09 DIAGNOSIS — J986 Disorders of diaphragm: Secondary | ICD-10-CM

## 2022-03-09 NOTE — Assessment & Plan Note (Signed)
Long-term chronic paralyzed right hemidiaphragm.

## 2022-04-08 ENCOUNTER — Encounter (INDEPENDENT_AMBULATORY_CARE_PROVIDER_SITE_OTHER): Payer: Medicare Other | Admitting: Ophthalmology

## 2022-04-08 DIAGNOSIS — H43813 Vitreous degeneration, bilateral: Secondary | ICD-10-CM

## 2022-04-08 DIAGNOSIS — H353112 Nonexudative age-related macular degeneration, right eye, intermediate dry stage: Secondary | ICD-10-CM | POA: Diagnosis not present

## 2022-04-08 DIAGNOSIS — H353221 Exudative age-related macular degeneration, left eye, with active choroidal neovascularization: Secondary | ICD-10-CM | POA: Diagnosis not present

## 2022-05-06 ENCOUNTER — Ambulatory Visit (HOSPITAL_COMMUNITY)
Admission: RE | Admit: 2022-05-06 | Discharge: 2022-05-06 | Disposition: A | Discharge status: Home / Self Care | Payer: Medicare Other | Source: Ambulatory Visit | Attending: Cardiovascular Disease | Admitting: Cardiovascular Disease

## 2022-05-06 ENCOUNTER — Encounter (HOSPITAL_BASED_OUTPATIENT_CLINIC_OR_DEPARTMENT_OTHER): Payer: Medicare Other

## 2022-05-06 DIAGNOSIS — I6523 Occlusion and stenosis of bilateral carotid arteries: Secondary | ICD-10-CM | POA: Insufficient documentation

## 2022-05-07 ENCOUNTER — Telehealth: Payer: Self-pay

## 2022-05-07 NOTE — Telephone Encounter (Signed)
Spoke with pt's daughter, DPR and advised of Karilyn Cota note: Still with mod risk, repeat in 1 year, see Dr. Marlou Porch note  Cecilie Kicks, FNP-C  Pt's daughter verbalizes understanding and thanked RN for the call.

## 2022-05-07 NOTE — Telephone Encounter (Signed)
-----   Message from Isaiah Serge, NP sent at 05/07/2022  1:34 PM EDT ----- Still with mod risk, repeat in 1 year, see Dr. Marlou Porch note  Cecilie Kicks, FNP-C

## 2022-05-20 ENCOUNTER — Encounter (INDEPENDENT_AMBULATORY_CARE_PROVIDER_SITE_OTHER): Payer: Medicare Other | Admitting: Ophthalmology

## 2022-05-20 DIAGNOSIS — H33301 Unspecified retinal break, right eye: Secondary | ICD-10-CM

## 2022-05-20 DIAGNOSIS — H43813 Vitreous degeneration, bilateral: Secondary | ICD-10-CM | POA: Diagnosis not present

## 2022-05-20 DIAGNOSIS — H353112 Nonexudative age-related macular degeneration, right eye, intermediate dry stage: Secondary | ICD-10-CM | POA: Diagnosis not present

## 2022-05-20 DIAGNOSIS — H353221 Exudative age-related macular degeneration, left eye, with active choroidal neovascularization: Secondary | ICD-10-CM

## 2022-07-01 ENCOUNTER — Encounter (INDEPENDENT_AMBULATORY_CARE_PROVIDER_SITE_OTHER): Payer: Medicare Other | Admitting: Ophthalmology

## 2022-07-01 DIAGNOSIS — H33301 Unspecified retinal break, right eye: Secondary | ICD-10-CM | POA: Diagnosis not present

## 2022-07-01 DIAGNOSIS — H43813 Vitreous degeneration, bilateral: Secondary | ICD-10-CM | POA: Diagnosis not present

## 2022-07-01 DIAGNOSIS — H353112 Nonexudative age-related macular degeneration, right eye, intermediate dry stage: Secondary | ICD-10-CM | POA: Diagnosis not present

## 2022-07-01 DIAGNOSIS — H353221 Exudative age-related macular degeneration, left eye, with active choroidal neovascularization: Secondary | ICD-10-CM | POA: Diagnosis not present

## 2022-07-02 ENCOUNTER — Other Ambulatory Visit (HOSPITAL_BASED_OUTPATIENT_CLINIC_OR_DEPARTMENT_OTHER): Payer: Self-pay | Admitting: Family

## 2022-07-02 NOTE — Telephone Encounter (Signed)
Pt of Dr. Skains. Please review for refill. Thank you! 

## 2022-07-03 ENCOUNTER — Other Ambulatory Visit: Payer: Self-pay | Admitting: Physician Assistant

## 2022-07-03 ENCOUNTER — Other Ambulatory Visit (HOSPITAL_BASED_OUTPATIENT_CLINIC_OR_DEPARTMENT_OTHER): Payer: Self-pay | Admitting: Family

## 2022-07-03 NOTE — Telephone Encounter (Signed)
Pt of Dr. Skains. Please review for refill. Thank you! 

## 2022-07-08 ENCOUNTER — Telehealth: Payer: Self-pay | Admitting: Cardiology

## 2022-07-08 NOTE — Telephone Encounter (Signed)
Pt daughter, Anne Fu, called back regarding message received in Triage office.   Ms. [name] stated Pt Colchicine prescription is about to run out.  Pt was prescribed this medication in the hospital.    Ms. [name] educated on what Colchicine is used for.  Pt has chronic knee pain.  Ms. [name] also made aware of Colchicine not appearing on Pt med list in Epic.    Ms. [name] advised to reach out to Dr. Steva Ready RN, to discuss the need for this medication, as it is not cardiac related, but they should be able to help.  Pharmacy listed in [place] is correct / verified by Ms. Linda.

## 2022-07-08 NOTE — Telephone Encounter (Signed)
Pt c/o medication issue:  1. Name of Medication: Colchicine  2. How are you currently taking this medication (dosage and times per day)?   3. Are you having a reaction (difficulty breathing--STAT)?   4. What is your medication issue? Is he supposed to be taking this medicine?

## 2022-08-12 ENCOUNTER — Encounter (INDEPENDENT_AMBULATORY_CARE_PROVIDER_SITE_OTHER): Payer: Medicare Other | Admitting: Ophthalmology

## 2022-08-12 DIAGNOSIS — H353221 Exudative age-related macular degeneration, left eye, with active choroidal neovascularization: Secondary | ICD-10-CM | POA: Diagnosis not present

## 2022-08-12 DIAGNOSIS — H353112 Nonexudative age-related macular degeneration, right eye, intermediate dry stage: Secondary | ICD-10-CM | POA: Diagnosis not present

## 2022-08-12 DIAGNOSIS — H33301 Unspecified retinal break, right eye: Secondary | ICD-10-CM | POA: Diagnosis not present

## 2022-08-12 DIAGNOSIS — H43813 Vitreous degeneration, bilateral: Secondary | ICD-10-CM

## 2022-09-07 NOTE — Progress Notes (Unsigned)
Office Visit    Patient Name: Tim Tim [name] Date of Encounter: 09/07/2022  Primary Care Provider:  Raelene Bott, MD Primary Cardiologist:  Tim Furbish, MD Primary Electrophysiologist: None  Chief Complaint    Tim Tim [name] is Tim [name] with PMH of CAD s/p MI in 2001 with stent to RCA and balloon angioplasty, in-stent restenosis with redo PCI of RCA 02/2000, CABG x 4 08/2017, carotid artery disease (left 60-79%, right 40-59%, right,) AAA, HLD (statin intolerant), HTN, asthma, and right paralyzed hemidiaphragm who presents today for 9-monthfollow-up of coronary artery disease.  Past Medical History    Past Medical History:  Diagnosis Date   AAA (abdominal aortic aneurysm)    3.2 cm 07/2016; 2-3 year f/u recommended   Asthma    BPH (benign prostatic hypertrophy)    CAD (coronary artery disease)    9 STEMI 2001, PCI, RCA, residual 100% circumflex  /   PCI for in-stent restenosis June, 2001  /  nuclear February, 2010, no ischemia   Cancer ([facility]    skin cancer removed   Cardiac mass 09/16/2021   Carotid artery disease ([place]    Doppler, June, 2011, 0-39% bilateral, pt denies   Cervical disc disease    Status post neurosurgery   Chest pain 09/13/2021   Colon polyps    Ejection fraction    EF 55-65%, echo, 2009   Elevated CPK    Chronic mild CPK elevation   Emphysema    Mild   Fatigue    Morning fatigue, August, 2011   GERD (gastroesophageal reflux disease)    Headache    back of head   Hyperlipidemia    Hypertension    Iron deficiency anemia 09/16/2021   Low back pain    February, 2013   Myocardial infarction ([facility]    Paralyzed hemidiaphragm    Right   Paralyzed hemidiaphragm    Chronic   Statin intolerance    Elevated CPK in the past   Past Surgical History:  Procedure Laterality Date   BREAST SURGERY     right breast removed   CERVICAL DISC SURGERY     CORONARY ARTERY BYPASS GRAFT N/Tim [name] 08/24/2017   Procedure: CORONARY ARTERY BYPASS GRAFTING times  four using left internal mammary artery and bilateral saphenous vein, using endoscope. TEE;  Surgeon: GGrace Isaac MD;  [place]  Service: Open Heart Surgery;  Laterality: N/Tim [name];   LEFT HEART CATH AND CORONARY ANGIOGRAPHY N/Tim [name] 07/29/2017   Procedure: LEFT HEART CATH AND CORONARY ANGIOGRAPHY;  Surgeon: ENelva Bush MD;  [place] [facility];  Service: Cardiovascular;  Laterality: N/Tim [name];   SKIN CANCER EXCISION     TEE WITHOUT CARDIOVERSION N/Tim [name] 08/24/2017   Procedure: TRANSESOPHAGEAL ECHOCARDIOGRAM (TEE);  Surgeon: GGrace Isaac MD;  [place] [facility]  Service: Open Heart Surgery;  Laterality: N/Tim [name];    Allergies  Allergies  Allergen Reactions   Statins Other (See Comments)    CLASS EFFECT SEVERE LEG CRAMPS [MULTIPLE STATINS]   Repatha [Evolocumab]     Flu like symptoms    History of Present Illness    Tim Tim [name] is Tim [name] 88year old male with the above mention past medical history who presents today for 628-monthollow-up of coronary artery disease.  Tim Tim [name] past cardiac history of MI in 2001 with PCI to RCA with in-stent restenosis in 02/2000.  He presented to the ED in 2014 with complaint of chest pain.  He  underwent Lexiscan Myoview 07/2013 that was negative for ischemia.  2D echo was completed 10/2012 showed EF of 55-60%, trivial aortic regurg with no RWMA.  He was seen in 07/2017 at [facility] with elevated troponin.  Repeat stress test was recommended however due to his previous history LHC was performed and showed three-vessel CAD.  He underwent CABG x 4 by Dr. Servando Snare that was successful.  He developed postop PAF with RVR and was treated with amiodarone drip with conversion.  EKG showed peaked T waves consistent with pericarditis and patient developed wheezing with atenolol and was switched to metoprolol.    He was seen 09/13/2021 for evaluation of chest pain radiated from his substernal to left arm. In the ED troponins were negative CT  chest showed pulmonary vascular congestion and multilobular mass on the left lateral wall.  EKG showed anterior septal Q waves with no ischemic changes.  He underwent further testing with cardiac CTA that revealed patent LIMA and no evidence of SVG aneurysm.  CPR and ESR were both elevated.  He did not require any LHC and found to have chronic pericarditis.  He was discharged with colchicine therapy for 6 months.  NSAIDs were not prescribed due to chronic anemia and risk of GI bleed.  He was encouraged to start cardiac rehab at [facility].  He was seen in follow-up 12/04/2021 by Laurann Montana, NP.  During visit patient was doing well with improvement of exertional dyspnea.  Recommendations were made for urine immunofixation studies and light chains with PYP scan.  Patient elected to avoid any additional therapy at that time.  He was seen by Dr. Marlou Porch last 03/09/2022 and endorsed feeling Tim [name] pain in the left chest at times but overall doing well.  He was riding stationary bike 3 times Tim [name] at cardiac rehab.  He was unable to tolerate ezetimibe and Repatha and was no longer on statin therapy.  Colchicine was discontinued patient continued with ASA 81 mg.   Tim Tim [name] today with his daughter for 62-monthfollow-up.  Since last being seen in the office patient reports that he is doing well with no new cardiac complaints at this time.  His blood pressure today was well-controlled at 126/64 and heart rate was 81 bpm.  He is currently tolerating his medications without any adverse reactions.  During our visit he reports that he is exercising 3 to 4 days Tim [name] at cardiac rehab.  He reports an occasional fleeting episode of chest pain that lasts seconds and is relieved spontaneously.  He reports no shortness of breath with physical activity and takes extra strength Tylenol for arthritic pain in his knees and back.  Patient denies chest pain, palpitations, dyspnea, PND, orthopnea, nausea, vomiting,  dizziness, syncope, edema, weight gain, or early satiety.  Home Medications    Current Outpatient Medications  Medication Sig Dispense Refill   acetaminophen (TYLENOL) 500 MG tablet Take 500 mg by mouth every 4 (four) hours as needed for mild pain.     albuterol (PROVENTIL HFA;VENTOLIN HFA) 108 (90 BASE) MCG/ACT inhaler Inhale 2 puffs into the lungs every 6 (six) hours as needed for wheezing or shortness of breath. 1 Inhaler 6   aspirin EC 81 MG tablet Take 81 mg by mouth daily.     BESIVANCE 0.6 % SUSP Apply to eye every 30 (thirty) days. Drops in left eye     BREZTRI AEROSPHERE 160-9-4.8 MCG/ACT AERO Inhale 2 puffs into the lungs 2 (two) times daily.  budesonide (PULMICORT) 0.5 MG/2ML nebulizer solution      calcium carbonate (OS-CAL) 1250 (500 Ca) MG chewable tablet Chew by mouth.     docusate sodium (COLACE) 100 MG capsule Take 1 capsule (100 mg total) by mouth 2 (two) times daily. 10 capsule 0   doxycycline (VIBRAMYCIN) 50 MG capsule Take 50 mg by mouth daily.      finasteride (PROSCAR) 5 MG tablet Take 5 mg by mouth 2 (two) times daily.     furosemide (LASIX) 40 MG tablet TAKE 1 TABLET (40 MG TOTAL) BY MOUTH DAILY AS NEEDED FOR FLUID. 90 tablet 3   KLOR-CON M20 20 MEQ tablet TAKE 1 TABLET BY MOUTH EVERY DAY 90 tablet 3   metoprolol succinate (TOPROL-XL) 50 MG 24 hr tablet TAKE 1 TABLET BY MOUTH DAILY. TAKE WITH OR IMMEDIATELY FOLLOWING Tim [name] MEAL. 90 tablet 1   montelukast (SINGULAIR) 10 MG tablet TAKE 1 TABLET BY MOUTH EVERYDAY AT BEDTIME 90 tablet 3   pantoprazole (PROTONIX) 40 MG tablet TAKE 1 TABLET BY MOUTH EVERY DAY 90 tablet 1   sodium chloride HYPERTONIC 3 % nebulizer solution Take 4 mLs by nebulization as needed for other or cough.     tamsulosin (FLOMAX) 0.4 MG CAPS capsule Take 0.4 mg by mouth 2 (two) times daily. For urinary symptoms     TRELEGY ELLIPTA 100-62.5-25 MCG/INH AEPB TAKE 1 PUFF BY MOUTH EVERY DAY 60 each 5   No current facility-administered medications for this  visit.     Review of Systems  Please see the history of present illness.    (+) Fleeting sharp chest pain (+) Arthritic left knee  All other systems reviewed and are otherwise negative except as noted above.  Physical Exam    Wt Readings from Last 3 Encounters:  03/09/22 182 lb (82.6 kg)  12/04/21 181 lb 3.2 oz (82.2 kg)  09/13/21 169 lb 14.4 oz (77.1 kg)   ZC:HYIFO were no vitals filed for this visit.,There is no height or weight on file to calculate BMI.  Constitutional:      Appearance: Healthy appearance. Not in distress.  Neck:     Vascular: JVD normal.  Pulmonary:     Effort: Pulmonary effort is normal.     Breath sounds: No wheezing. No rales. Diminished in the bases Cardiovascular:     Normal rate. Regular rhythm. Normal S1. Normal S2.      Murmurs: There is no murmur.  Edema:    Peripheral edema absent.  Abdominal:     Palpations: Abdomen is soft non tender. There is no hepatomegaly.  Skin:    General: Skin is warm and dry.  Neurological:     General: No focal deficit present.     Mental Status: Alert and oriented to person, place and time.     Cranial Nerves: Cranial nerves are intact.  EKG/LABS/Other Studies Reviewed    ECG personally reviewed by me today -sinus rhythm with TWI in V6 and no acute changes with rate of 81 bpm consistent with previous EKG.  Risk Assessment/Calculations:            Lab Results  Component Value Date   WBC 6.4 09/16/2021   HGB 7.2 (L) 09/16/2021   HCT 24.3 (L) 09/16/2021   MCV 77.1 (L) 09/16/2021   PLT 314 09/16/2021   Lab Results  Component Value Date   CREATININE 1.28 (H) 09/16/2021   BUN 23 09/16/2021   NA 138 09/16/2021   K 4.5 09/16/2021   CL  108 09/16/2021   CO2 24 09/16/2021   Lab Results  Component Value Date   ALT 11 09/13/2021   AST 14 (L) 09/13/2021   ALKPHOS 78 09/13/2021   BILITOT 0.5 09/13/2021   Lab Results  Component Value Date   CHOL 159 07/10/2021   HDL 35 (L) 07/10/2021   LDLCALC 104  (H) 07/10/2021   TRIG 110 07/10/2021   CHOLHDL 4.5 07/10/2021    Lab Results  Component Value Date   HGBA1C 7.1 (H) 09/13/2021    Assessment & Plan    1.  Coronary artery disease: -s/p CABG 2018 with prior STEMI in 2001 with PCI to RCA.   -Today patient reports that he has doing well and has occasional fleeting sharp chest discomfort that relieved spontaneously. -Continue current GDMT ASA 81 mg, and metoprolol 50 mg, not on statin therapy due to intolerance  2.  Chronic pericarditis: -Cardiac MRI 09/2021 revealing chronic pericardial inflammation with mild LV/LA compression.  Patient was on chronic colchicine therapy that was discontinued 02/2022 -Today patient reports no recurrence of chest discomfort similar to pericarditis pain.   3.  Primary hypertension: -Patient's blood pressure today was well-controlled at 126/64 -Continue metoprolol as noted above  4.  Hyperlipidemia: -Patient's last LDL was elevated at 167 -His daughter reports that he may not have been fasting during lipid and will have redrawn by primary care -Patient currently not on statins due to intolerance  5.  Carotid artery disease: -Most recent carotid Doppler completed 04/2022 with moderate disease (right 40-59%, left 60-79%) and recommendation to repeat Doppler in 1 year. -Continue ASA 81 mg with no statin therapy due to intolerance   Disposition: Follow-up with Tim Furbish, MD or APP in 6 months     Medication Adjustments/Labs and Tests Ordered: Current medicines are reviewed at length with the patient today.  Concerns regarding medicines are outlined above.   Signed, Mable Fill, Marissa Nestle, NP 09/07/2022, 9:17 AM [facility]  Note:  This document was prepared using Dragon voice recognition software and may include unintentional dictation errors.

## 2022-09-08 ENCOUNTER — Ambulatory Visit: Payer: Medicare Other | Attending: Nurse Practitioner | Admitting: Nurse Practitioner

## 2022-09-08 ENCOUNTER — Encounter: Payer: Self-pay | Admitting: Nurse Practitioner

## 2022-09-08 VITALS — BP 126/64 | HR 81 | Ht 71.0 in | Wt 185.0 lb

## 2022-09-08 DIAGNOSIS — E7800 Pure hypercholesterolemia, unspecified: Secondary | ICD-10-CM | POA: Diagnosis not present

## 2022-09-08 DIAGNOSIS — I6523 Occlusion and stenosis of bilateral carotid arteries: Secondary | ICD-10-CM

## 2022-09-08 DIAGNOSIS — I10 Essential (primary) hypertension: Secondary | ICD-10-CM

## 2022-09-08 DIAGNOSIS — I2510 Atherosclerotic heart disease of native coronary artery without angina pectoris: Secondary | ICD-10-CM

## 2022-09-08 DIAGNOSIS — I319 Disease of pericardium, unspecified: Secondary | ICD-10-CM

## 2022-09-08 NOTE — Patient Instructions (Signed)
Medication Instructions:  Your physician recommends that you continue on your current medications as directed. Please refer to the Current Medication list given to you today.  Take one extra dose of lasix today  *If you need a refill on your cardiac medications before your next appointment, please call your pharmacy*  Follow-Up: At [facility], you and your health needs are our priority.  As part of our continuing mission to provide you with exceptional heart care, we have created designated Provider Care Teams.  These Care Teams include your primary Cardiologist (physician) and Advanced Practice Providers (APPs -  Physician Assistants and Nurse Practitioners) who all work together to provide you with the care you need, when you need it.   Your next appointment:   6 month(s)  The format for your next appointment:   In Person  Provider:   Candee Furbish, MD      Important Information About Sugar

## 2022-09-23 ENCOUNTER — Encounter (INDEPENDENT_AMBULATORY_CARE_PROVIDER_SITE_OTHER): Payer: Medicare Other | Admitting: Ophthalmology

## 2022-09-23 DIAGNOSIS — H353112 Nonexudative age-related macular degeneration, right eye, intermediate dry stage: Secondary | ICD-10-CM | POA: Diagnosis not present

## 2022-09-23 DIAGNOSIS — H33301 Unspecified retinal break, right eye: Secondary | ICD-10-CM | POA: Diagnosis not present

## 2022-09-23 DIAGNOSIS — H43813 Vitreous degeneration, bilateral: Secondary | ICD-10-CM | POA: Diagnosis not present

## 2022-09-23 DIAGNOSIS — H353221 Exudative age-related macular degeneration, left eye, with active choroidal neovascularization: Secondary | ICD-10-CM | POA: Diagnosis not present

## 2022-11-04 ENCOUNTER — Encounter (INDEPENDENT_AMBULATORY_CARE_PROVIDER_SITE_OTHER): Payer: Medicare Other | Admitting: Ophthalmology

## 2022-11-04 DIAGNOSIS — H353112 Nonexudative age-related macular degeneration, right eye, intermediate dry stage: Secondary | ICD-10-CM

## 2022-11-04 DIAGNOSIS — H33301 Unspecified retinal break, right eye: Secondary | ICD-10-CM

## 2022-11-04 DIAGNOSIS — H353221 Exudative age-related macular degeneration, left eye, with active choroidal neovascularization: Secondary | ICD-10-CM

## 2022-11-04 DIAGNOSIS — H43813 Vitreous degeneration, bilateral: Secondary | ICD-10-CM

## 2022-12-16 ENCOUNTER — Encounter (INDEPENDENT_AMBULATORY_CARE_PROVIDER_SITE_OTHER): Payer: Medicare HMO | Admitting: Ophthalmology

## 2022-12-16 DIAGNOSIS — H353112 Nonexudative age-related macular degeneration, right eye, intermediate dry stage: Secondary | ICD-10-CM | POA: Diagnosis not present

## 2022-12-16 DIAGNOSIS — H43813 Vitreous degeneration, bilateral: Secondary | ICD-10-CM | POA: Diagnosis not present

## 2022-12-16 DIAGNOSIS — H33301 Unspecified retinal break, right eye: Secondary | ICD-10-CM

## 2022-12-16 DIAGNOSIS — H353221 Exudative age-related macular degeneration, left eye, with active choroidal neovascularization: Secondary | ICD-10-CM

## 2022-12-25 ENCOUNTER — Other Ambulatory Visit (HOSPITAL_BASED_OUTPATIENT_CLINIC_OR_DEPARTMENT_OTHER): Payer: Self-pay | Admitting: Cardiology

## 2023-01-27 ENCOUNTER — Encounter (INDEPENDENT_AMBULATORY_CARE_PROVIDER_SITE_OTHER): Payer: Medicare HMO | Admitting: Ophthalmology

## 2023-01-27 DIAGNOSIS — H33301 Unspecified retinal break, right eye: Secondary | ICD-10-CM | POA: Diagnosis not present

## 2023-01-27 DIAGNOSIS — H353221 Exudative age-related macular degeneration, left eye, with active choroidal neovascularization: Secondary | ICD-10-CM | POA: Diagnosis not present

## 2023-01-27 DIAGNOSIS — H43813 Vitreous degeneration, bilateral: Secondary | ICD-10-CM

## 2023-01-27 DIAGNOSIS — H353112 Nonexudative age-related macular degeneration, right eye, intermediate dry stage: Secondary | ICD-10-CM | POA: Diagnosis not present

## 2023-03-09 NOTE — Progress Notes (Deleted)
  Cardiology Office Note:  .   Date:  03/09/2023  ID:  Tim [name], DOB 1928-12-30, MRN [id number] PCP: Lindwood Qua, MD  [facility] Providers Cardiologist:  Donato Schultz, MD { Click to update primary MD,subspecialty MD or APP then REFRESH:1}   History of Present Illness: .   Tim [name] is a 89 y.o. male coronary disease status post CABG x 4 in 2018 with carotid artery disease hypertension hyperlipidemia statin intolerant with right paralyzed hemidiaphragm here for follow-up.  At prior visit 6 months ago was doing well without any chest pain or shortness of breath.  ROS: ***  Studies Reviewed: .        CT 09/14/2021-CABG grafts - Patent LIMA to LAD, patent SVG to RCA OM and D1.  No direct communication to pericardiac mass.  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  Risk Assessment/Calculations:   {Does this patient have ATRIAL FIBRILLATION?:[id number]} No BP recorded.  {Refresh Note OR Click here to enter BP  :1}***       Physical Exam:   VS:  There were no vitals taken for this visit.   Wt Readings from Last 3 Encounters:  09/08/22 185 lb (83.9 kg)  03/09/22 182 lb (82.6 kg)  12/04/21 181 lb 3.2 oz (82.2 kg)    GEN: Well nourished, well developed in no acute distress NECK: No JVD; No carotid bruits CARDIAC: ***RRR, no murmurs, rubs, gallops RESPIRATORY:  Clear to auscultation without rales, wheezing or rhonchi  ABDOMEN: Soft, non-tender, non-distended EXTREMITIES:  No edema; No deformity   ASSESSMENT AND PLAN: .    Coronary artery disease - CABG 2018 prior STEMI 2001 PCI to RCA in the past. -Doing well sharp fleeting chest pain appears noncardiac. - Continue with goal-directed medical therapy aspirin 81 mg metoprolol 50 mg.  Hyperlipidemia - Not on statin therapy due to intolerance.  Last LDL was 167.  Does not desire to have nonstatin alternatives.  Carotid artery disease - Right 40 to 59%, left 60 to 79%.  Monitoring.  Aspirin 81 mg.  Declined statin due  to intolerance.  Chronic pericarditis - Cardiac MRI in January 2023 showed chronic pericardial inflammation with mild LV/LA compression.  He was on chronic colchicine therapy that was discontinued in June 2023.  No recurrence.    {Are you ordering a CV Procedure (e.g. stress test, cath, DCCV, TEE, etc)?   Press F2        :[id number]}  Dispo: ***  Signed, Donato Schultz, MD

## 2023-03-10 ENCOUNTER — Ambulatory Visit: Payer: BC Managed Care – PPO | Attending: Cardiology | Admitting: Cardiology

## 2023-03-10 ENCOUNTER — Encounter: Payer: Self-pay | Admitting: Cardiology

## 2023-03-13 ENCOUNTER — Other Ambulatory Visit: Payer: Self-pay | Admitting: Cardiology

## 2023-03-17 ENCOUNTER — Encounter (INDEPENDENT_AMBULATORY_CARE_PROVIDER_SITE_OTHER): Payer: Medicare HMO | Admitting: Ophthalmology

## 2023-03-17 DIAGNOSIS — H43813 Vitreous degeneration, bilateral: Secondary | ICD-10-CM

## 2023-03-17 DIAGNOSIS — H353112 Nonexudative age-related macular degeneration, right eye, intermediate dry stage: Secondary | ICD-10-CM

## 2023-03-17 DIAGNOSIS — H353221 Exudative age-related macular degeneration, left eye, with active choroidal neovascularization: Secondary | ICD-10-CM

## 2023-03-17 DIAGNOSIS — H33301 Unspecified retinal break, right eye: Secondary | ICD-10-CM | POA: Diagnosis not present

## 2023-03-19 ENCOUNTER — Other Ambulatory Visit (HOSPITAL_BASED_OUTPATIENT_CLINIC_OR_DEPARTMENT_OTHER): Payer: Self-pay | Admitting: Family

## 2023-03-19 ENCOUNTER — Other Ambulatory Visit: Payer: Self-pay | Admitting: Cardiology

## 2023-04-10 ENCOUNTER — Other Ambulatory Visit (HOSPITAL_BASED_OUTPATIENT_CLINIC_OR_DEPARTMENT_OTHER): Payer: Self-pay | Admitting: Cardiology

## 2023-04-12 ENCOUNTER — Other Ambulatory Visit: Payer: Self-pay | Admitting: Cardiology

## 2023-05-05 ENCOUNTER — Encounter (INDEPENDENT_AMBULATORY_CARE_PROVIDER_SITE_OTHER): Payer: Medicare HMO | Admitting: Ophthalmology

## 2023-05-05 DIAGNOSIS — H353221 Exudative age-related macular degeneration, left eye, with active choroidal neovascularization: Secondary | ICD-10-CM | POA: Diagnosis not present

## 2023-05-05 DIAGNOSIS — H43813 Vitreous degeneration, bilateral: Secondary | ICD-10-CM | POA: Diagnosis not present

## 2023-05-05 DIAGNOSIS — H33301 Unspecified retinal break, right eye: Secondary | ICD-10-CM | POA: Diagnosis not present

## 2023-05-05 DIAGNOSIS — H353112 Nonexudative age-related macular degeneration, right eye, intermediate dry stage: Secondary | ICD-10-CM

## 2023-06-17 ENCOUNTER — Ambulatory Visit: Payer: BC Managed Care – PPO | Attending: Cardiology | Admitting: Cardiology

## 2023-06-17 ENCOUNTER — Encounter: Payer: Self-pay | Admitting: Cardiology

## 2023-06-17 VITALS — BP 112/66 | HR 104 | Ht 71.0 in | Wt 173.6 lb

## 2023-06-17 DIAGNOSIS — Z951 Presence of aortocoronary bypass graft: Secondary | ICD-10-CM

## 2023-06-17 DIAGNOSIS — I2510 Atherosclerotic heart disease of native coronary artery without angina pectoris: Secondary | ICD-10-CM

## 2023-06-17 DIAGNOSIS — I10 Essential (primary) hypertension: Secondary | ICD-10-CM

## 2023-06-17 NOTE — Patient Instructions (Signed)

## 2023-06-17 NOTE — Progress Notes (Signed)
Cardiology Office Note:  .   Date:  06/17/2023  ID:  Tim [name], DOB 02/04/1929, MRN [id number] PCP: Lindwood Qua, MD  [facility] Providers Cardiologist:  Donato Schultz, MD     History of Present Illness: .   Tim [name] is a 89 y.o. male Discussed the use of AI scribe software History of Present Illness   The patient, a 89 year old male with a history of coronary artery disease, presented for a follow-up visit. He underwent a myocardial infarction in 2001, which was managed with stent placement in the right coronary artery (RCA) and percutaneous coronary intervention (PCI). In 2018, he underwent coronary artery bypass grafting (CABG). He also has a history of carotid artery disease, with the left side being more affected than the right, and an abdominal aortic aneurysm (AAA).  The patient reported feeling generally well for his age, with good and bad days. He had a bout of pneumonia in April, which took a long time to resolve, and he experienced recurrent lung infections. He denied any recent chest pain, but occasionally experienced a sudden, brief discomfort in the chest, which he described as a shock rather than pain. He was previously on colchicine for pericarditis, which was discontinued in June.  The patient also pointed out a small spot on his chest, which he noticed a week or two prior. He reported no pain or discomfort from the spot, but was concerned about its appearance.  [place] daughter. 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 Despite these challenges, the patient remains active and engaged in activities such as woodworking.       Studies Reviewed: .        Results LABS LDL cholesterol: 104 mg/dL  RADIOLOGY Chest X-ray: Sternal wires from previous surgery, no acute findings  Risk Assessment/Calculations:             Physical Exam:   VS:  BP 112/66   Pulse (!)  104   Ht 5\' 11"  (1.803 m)   Wt 173 lb 9.6 oz (78.7 kg)   SpO2 98%   BMI 24.21 kg/m    Wt Readings from Last 3 Encounters:  06/17/23 173 lb 9.6 oz (78.7 kg)  09/08/22 185 lb (83.9 kg)  03/09/22 182 lb (82.6 kg)    GEN: Well nourished, well developed in no acute distress NECK: No JVD; No carotid bruits CARDIAC: CABG RRR, no murmurs, no rubs, no gallops RESPIRATORY:  Clear to auscultation without rales, wheezing or rhonchi  ABDOMEN: Soft, non-tender, non-distended EXTREMITIES:  No edema; No deformity   ASSESSMENT AND PLAN: .    Assessment and Plan    Coronary Artery Disease Status post MI in 2001 with stent to the RCA, balloon angioplasty with PCI to the RCA in 2001, and CABG x4 in 2018. No recent chest pain, occasional brief discomfort in the chest. -Continue current management.  Carotid Artery Disease Left greater than right, left 80%. No new symptoms reported. -Continue current management.  Abdominal Aortic Aneurysm No new symptoms reported. -Continue current management.  Post-operative status Noted a small bulge in the chest, likely due to metal ties from CABG surgery. No pain or discomfort reported. -Monitor for any changes, contact office if any metal becomes visible or if area becomes infected.  General Health Recent bout of pneumonia in April, now resolved. -Continue current management.  Follow-up Continue current management and medications. Monitor chest  bulge and report any changes.               Signed, Donato Schultz, MD

## 2023-06-23 ENCOUNTER — Encounter (INDEPENDENT_AMBULATORY_CARE_PROVIDER_SITE_OTHER): Payer: Medicare HMO | Admitting: Ophthalmology

## 2023-06-23 DIAGNOSIS — H353221 Exudative age-related macular degeneration, left eye, with active choroidal neovascularization: Secondary | ICD-10-CM

## 2023-06-23 DIAGNOSIS — H33301 Unspecified retinal break, right eye: Secondary | ICD-10-CM | POA: Diagnosis not present

## 2023-06-23 DIAGNOSIS — H43813 Vitreous degeneration, bilateral: Secondary | ICD-10-CM

## 2023-06-23 DIAGNOSIS — H353112 Nonexudative age-related macular degeneration, right eye, intermediate dry stage: Secondary | ICD-10-CM | POA: Diagnosis not present

## 2023-07-11 ENCOUNTER — Other Ambulatory Visit: Payer: Self-pay | Admitting: Cardiology

## 2023-08-11 ENCOUNTER — Encounter (INDEPENDENT_AMBULATORY_CARE_PROVIDER_SITE_OTHER): Payer: Medicare HMO | Admitting: Ophthalmology

## 2023-08-11 DIAGNOSIS — H353221 Exudative age-related macular degeneration, left eye, with active choroidal neovascularization: Secondary | ICD-10-CM | POA: Diagnosis not present

## 2023-08-11 DIAGNOSIS — H33301 Unspecified retinal break, right eye: Secondary | ICD-10-CM

## 2023-08-11 DIAGNOSIS — H353112 Nonexudative age-related macular degeneration, right eye, intermediate dry stage: Secondary | ICD-10-CM

## 2023-08-11 DIAGNOSIS — H43813 Vitreous degeneration, bilateral: Secondary | ICD-10-CM

## 2023-09-21 ENCOUNTER — Other Ambulatory Visit (HOSPITAL_BASED_OUTPATIENT_CLINIC_OR_DEPARTMENT_OTHER): Payer: Self-pay | Admitting: Cardiology

## 2023-09-29 ENCOUNTER — Encounter (INDEPENDENT_AMBULATORY_CARE_PROVIDER_SITE_OTHER): Payer: Medicare PPO | Admitting: Ophthalmology

## 2023-09-29 DIAGNOSIS — H353112 Nonexudative age-related macular degeneration, right eye, intermediate dry stage: Secondary | ICD-10-CM

## 2023-09-29 DIAGNOSIS — H353221 Exudative age-related macular degeneration, left eye, with active choroidal neovascularization: Secondary | ICD-10-CM | POA: Diagnosis not present

## 2023-09-29 DIAGNOSIS — H33301 Unspecified retinal break, right eye: Secondary | ICD-10-CM | POA: Diagnosis not present

## 2023-09-29 DIAGNOSIS — H43813 Vitreous degeneration, bilateral: Secondary | ICD-10-CM

## 2023-11-13 DEATH — deceased

## 2023-11-17 ENCOUNTER — Encounter (INDEPENDENT_AMBULATORY_CARE_PROVIDER_SITE_OTHER): Payer: Medicare Other | Admitting: Ophthalmology
